# Patient Record
Sex: Female | Born: 1935 | Race: White | Hispanic: No | Marital: Single | Smoking: Never smoker
Health system: Southern US, Community
[De-identification: ages and names within clinical notes are randomized; demographics above are authoritative.]

## PROBLEM LIST (undated history)

## (undated) DIAGNOSIS — R413 Other amnesia: Secondary | ICD-10-CM

## (undated) DIAGNOSIS — R410 Disorientation, unspecified: Secondary | ICD-10-CM

## (undated) DIAGNOSIS — E039 Hypothyroidism, unspecified: Secondary | ICD-10-CM

## (undated) DIAGNOSIS — H3530 Unspecified macular degeneration: Secondary | ICD-10-CM

## (undated) DIAGNOSIS — M810 Age-related osteoporosis without current pathological fracture: Secondary | ICD-10-CM

## (undated) DIAGNOSIS — E785 Hyperlipidemia, unspecified: Secondary | ICD-10-CM

## (undated) DIAGNOSIS — I10 Essential (primary) hypertension: Secondary | ICD-10-CM

## (undated) DIAGNOSIS — E119 Type 2 diabetes mellitus without complications: Secondary | ICD-10-CM

## (undated) HISTORY — DX: Hyperlipidemia, unspecified: E78.5

## (undated) HISTORY — DX: Hypothyroidism, unspecified: E03.9

## (undated) HISTORY — DX: Type 2 diabetes mellitus without complications: E11.9

## (undated) HISTORY — DX: Unspecified macular degeneration: H35.30

## (undated) HISTORY — DX: Other amnesia: R41.3

## (undated) HISTORY — DX: Essential (primary) hypertension: I10

## (undated) HISTORY — DX: Disorientation, unspecified: R41.0

## (undated) HISTORY — DX: Age-related osteoporosis without current pathological fracture: M81.0

---

## 2000-09-18 LAB — HM SIGMOIDOSCOPY: HM Sigmoidoscopy: NORMAL

## 2003-03-01 LAB — HM PAP SMEAR

## 2005-04-11 ENCOUNTER — Ambulatory Visit: Payer: Self-pay | Admitting: Family Medicine

## 2006-06-12 ENCOUNTER — Ambulatory Visit: Payer: Self-pay | Admitting: *Deleted

## 2007-02-19 ENCOUNTER — Ambulatory Visit: Payer: Self-pay | Admitting: *Deleted

## 2007-04-14 ENCOUNTER — Ambulatory Visit: Payer: Self-pay | Admitting: Gastroenterology

## 2007-04-14 LAB — HM COLONOSCOPY

## 2007-04-15 ENCOUNTER — Ambulatory Visit: Payer: Self-pay | Admitting: Gastroenterology

## 2007-06-18 ENCOUNTER — Ambulatory Visit: Payer: Self-pay | Admitting: *Deleted

## 2008-03-09 ENCOUNTER — Ambulatory Visit: Payer: Self-pay | Admitting: *Deleted

## 2008-06-20 ENCOUNTER — Ambulatory Visit: Payer: Self-pay | Admitting: *Deleted

## 2009-06-23 ENCOUNTER — Ambulatory Visit: Payer: Self-pay | Admitting: Family Medicine

## 2009-11-18 HISTORY — PX: ADENOIDECTOMY: SUR15

## 2010-09-25 ENCOUNTER — Ambulatory Visit: Payer: Self-pay | Admitting: Family Medicine

## 2010-10-22 ENCOUNTER — Ambulatory Visit: Payer: Self-pay | Admitting: Anesthesiology

## 2010-10-23 ENCOUNTER — Ambulatory Visit: Payer: Self-pay | Admitting: Otolaryngology

## 2011-09-05 ENCOUNTER — Ambulatory Visit: Payer: Self-pay | Admitting: Family Medicine

## 2011-11-01 ENCOUNTER — Ambulatory Visit: Payer: Self-pay | Admitting: Family Medicine

## 2011-11-06 LAB — HM MAMMOGRAPHY

## 2012-03-11 DIAGNOSIS — E039 Hypothyroidism, unspecified: Secondary | ICD-10-CM | POA: Diagnosis not present

## 2012-03-11 DIAGNOSIS — E785 Hyperlipidemia, unspecified: Secondary | ICD-10-CM | POA: Diagnosis not present

## 2012-03-11 DIAGNOSIS — E119 Type 2 diabetes mellitus without complications: Secondary | ICD-10-CM | POA: Diagnosis not present

## 2012-03-11 DIAGNOSIS — Z130 Encounter for screening for diseases of the blood and blood-forming organs and certain disorders involving the immune mechanism: Secondary | ICD-10-CM | POA: Diagnosis not present

## 2012-03-11 DIAGNOSIS — I10 Essential (primary) hypertension: Secondary | ICD-10-CM | POA: Diagnosis not present

## 2012-06-22 DIAGNOSIS — E119 Type 2 diabetes mellitus without complications: Secondary | ICD-10-CM | POA: Diagnosis not present

## 2012-06-22 DIAGNOSIS — I10 Essential (primary) hypertension: Secondary | ICD-10-CM | POA: Diagnosis not present

## 2012-06-22 DIAGNOSIS — Z1329 Encounter for screening for other suspected endocrine disorder: Secondary | ICD-10-CM | POA: Diagnosis not present

## 2012-06-22 DIAGNOSIS — Z130 Encounter for screening for diseases of the blood and blood-forming organs and certain disorders involving the immune mechanism: Secondary | ICD-10-CM | POA: Diagnosis not present

## 2012-07-17 DIAGNOSIS — J301 Allergic rhinitis due to pollen: Secondary | ICD-10-CM | POA: Diagnosis not present

## 2012-07-17 DIAGNOSIS — R490 Dysphonia: Secondary | ICD-10-CM | POA: Diagnosis not present

## 2012-07-17 DIAGNOSIS — J060 Acute laryngopharyngitis: Secondary | ICD-10-CM | POA: Diagnosis not present

## 2012-08-12 DIAGNOSIS — E11329 Type 2 diabetes mellitus with mild nonproliferative diabetic retinopathy without macular edema: Secondary | ICD-10-CM | POA: Diagnosis not present

## 2012-08-12 LAB — HM DIABETES EYE EXAM

## 2012-08-28 DIAGNOSIS — J060 Acute laryngopharyngitis: Secondary | ICD-10-CM | POA: Diagnosis not present

## 2012-08-28 DIAGNOSIS — J301 Allergic rhinitis due to pollen: Secondary | ICD-10-CM | POA: Diagnosis not present

## 2012-08-28 DIAGNOSIS — R490 Dysphonia: Secondary | ICD-10-CM | POA: Diagnosis not present

## 2012-09-23 DIAGNOSIS — Z1329 Encounter for screening for other suspected endocrine disorder: Secondary | ICD-10-CM | POA: Diagnosis not present

## 2012-09-23 DIAGNOSIS — I10 Essential (primary) hypertension: Secondary | ICD-10-CM | POA: Diagnosis not present

## 2012-09-23 DIAGNOSIS — Z1211 Encounter for screening for malignant neoplasm of colon: Secondary | ICD-10-CM | POA: Diagnosis not present

## 2012-09-23 DIAGNOSIS — E119 Type 2 diabetes mellitus without complications: Secondary | ICD-10-CM | POA: Diagnosis not present

## 2012-09-23 DIAGNOSIS — M810 Age-related osteoporosis without current pathological fracture: Secondary | ICD-10-CM | POA: Diagnosis not present

## 2012-09-24 DIAGNOSIS — Z1211 Encounter for screening for malignant neoplasm of colon: Secondary | ICD-10-CM | POA: Diagnosis not present

## 2012-11-19 ENCOUNTER — Ambulatory Visit: Payer: Self-pay | Admitting: Family Medicine

## 2012-11-19 DIAGNOSIS — Z13820 Encounter for screening for osteoporosis: Secondary | ICD-10-CM | POA: Diagnosis not present

## 2012-11-19 DIAGNOSIS — Z1231 Encounter for screening mammogram for malignant neoplasm of breast: Secondary | ICD-10-CM | POA: Diagnosis not present

## 2012-11-19 DIAGNOSIS — M810 Age-related osteoporosis without current pathological fracture: Secondary | ICD-10-CM | POA: Diagnosis not present

## 2013-01-27 DIAGNOSIS — M1710 Unilateral primary osteoarthritis, unspecified knee: Secondary | ICD-10-CM | POA: Diagnosis not present

## 2013-02-09 DIAGNOSIS — H3530 Unspecified macular degeneration: Secondary | ICD-10-CM | POA: Diagnosis not present

## 2013-02-12 ENCOUNTER — Ambulatory Visit: Payer: Self-pay | Admitting: Family Medicine

## 2013-02-12 DIAGNOSIS — M549 Dorsalgia, unspecified: Secondary | ICD-10-CM | POA: Diagnosis not present

## 2013-02-12 DIAGNOSIS — R609 Edema, unspecified: Secondary | ICD-10-CM | POA: Diagnosis not present

## 2013-02-25 DIAGNOSIS — M6282 Rhabdomyolysis: Secondary | ICD-10-CM | POA: Diagnosis not present

## 2013-02-25 DIAGNOSIS — E559 Vitamin D deficiency, unspecified: Secondary | ICD-10-CM | POA: Diagnosis not present

## 2013-02-25 DIAGNOSIS — M810 Age-related osteoporosis without current pathological fracture: Secondary | ICD-10-CM | POA: Diagnosis not present

## 2013-02-25 DIAGNOSIS — R609 Edema, unspecified: Secondary | ICD-10-CM | POA: Diagnosis not present

## 2013-02-25 DIAGNOSIS — M62838 Other muscle spasm: Secondary | ICD-10-CM | POA: Diagnosis not present

## 2013-03-05 ENCOUNTER — Emergency Department: Payer: Self-pay | Admitting: Emergency Medicine

## 2013-03-05 DIAGNOSIS — M7989 Other specified soft tissue disorders: Secondary | ICD-10-CM | POA: Diagnosis not present

## 2013-03-05 DIAGNOSIS — I10 Essential (primary) hypertension: Secondary | ICD-10-CM | POA: Diagnosis not present

## 2013-03-05 DIAGNOSIS — L0291 Cutaneous abscess, unspecified: Secondary | ICD-10-CM | POA: Diagnosis not present

## 2013-03-05 DIAGNOSIS — Z79899 Other long term (current) drug therapy: Secondary | ICD-10-CM | POA: Diagnosis not present

## 2013-03-05 DIAGNOSIS — M79609 Pain in unspecified limb: Secondary | ICD-10-CM | POA: Diagnosis not present

## 2013-03-05 DIAGNOSIS — R609 Edema, unspecified: Secondary | ICD-10-CM | POA: Diagnosis not present

## 2013-03-08 ENCOUNTER — Emergency Department: Payer: Self-pay | Admitting: Emergency Medicine

## 2013-03-08 DIAGNOSIS — Z79899 Other long term (current) drug therapy: Secondary | ICD-10-CM | POA: Diagnosis not present

## 2013-03-08 DIAGNOSIS — R9431 Abnormal electrocardiogram [ECG] [EKG]: Secondary | ICD-10-CM | POA: Diagnosis not present

## 2013-03-08 DIAGNOSIS — Z9181 History of falling: Secondary | ICD-10-CM | POA: Diagnosis not present

## 2013-03-08 DIAGNOSIS — M25559 Pain in unspecified hip: Secondary | ICD-10-CM | POA: Diagnosis not present

## 2013-03-08 DIAGNOSIS — S7000XA Contusion of unspecified hip, initial encounter: Secondary | ICD-10-CM | POA: Diagnosis not present

## 2013-03-08 DIAGNOSIS — R6889 Other general symptoms and signs: Secondary | ICD-10-CM | POA: Diagnosis not present

## 2013-03-08 DIAGNOSIS — R609 Edema, unspecified: Secondary | ICD-10-CM | POA: Diagnosis not present

## 2013-03-08 DIAGNOSIS — E119 Type 2 diabetes mellitus without complications: Secondary | ICD-10-CM | POA: Diagnosis not present

## 2013-03-08 LAB — CBC
HCT: 37.7 % (ref 35.0–47.0)
MCHC: 34.6 g/dL (ref 32.0–36.0)
MCV: 91 fL (ref 80–100)
RBC: 4.14 10*6/uL (ref 3.80–5.20)
RDW: 13.3 % (ref 11.5–14.5)

## 2013-03-08 LAB — BASIC METABOLIC PANEL
Anion Gap: 7 (ref 7–16)
BUN: 23 mg/dL — ABNORMAL HIGH (ref 7–18)
Calcium, Total: 9 mg/dL (ref 8.5–10.1)
Chloride: 105 mmol/L (ref 98–107)
Creatinine: 0.66 mg/dL (ref 0.60–1.30)
Osmolality: 285 (ref 275–301)
Potassium: 3.3 mmol/L — ABNORMAL LOW (ref 3.5–5.1)
Sodium: 140 mmol/L (ref 136–145)

## 2013-03-09 DIAGNOSIS — M810 Age-related osteoporosis without current pathological fracture: Secondary | ICD-10-CM | POA: Diagnosis not present

## 2013-03-09 DIAGNOSIS — I10 Essential (primary) hypertension: Secondary | ICD-10-CM | POA: Diagnosis not present

## 2013-03-09 DIAGNOSIS — R609 Edema, unspecified: Secondary | ICD-10-CM | POA: Diagnosis not present

## 2013-03-09 DIAGNOSIS — Z9181 History of falling: Secondary | ICD-10-CM | POA: Diagnosis not present

## 2013-03-09 DIAGNOSIS — E119 Type 2 diabetes mellitus without complications: Secondary | ICD-10-CM | POA: Diagnosis not present

## 2013-03-09 DIAGNOSIS — R269 Unspecified abnormalities of gait and mobility: Secondary | ICD-10-CM | POA: Diagnosis not present

## 2013-03-10 DIAGNOSIS — R269 Unspecified abnormalities of gait and mobility: Secondary | ICD-10-CM | POA: Diagnosis not present

## 2013-03-10 DIAGNOSIS — Z9181 History of falling: Secondary | ICD-10-CM | POA: Diagnosis not present

## 2013-03-10 DIAGNOSIS — I10 Essential (primary) hypertension: Secondary | ICD-10-CM | POA: Diagnosis not present

## 2013-03-10 DIAGNOSIS — M810 Age-related osteoporosis without current pathological fracture: Secondary | ICD-10-CM | POA: Diagnosis not present

## 2013-03-10 DIAGNOSIS — E119 Type 2 diabetes mellitus without complications: Secondary | ICD-10-CM | POA: Diagnosis not present

## 2013-03-10 DIAGNOSIS — R609 Edema, unspecified: Secondary | ICD-10-CM | POA: Diagnosis not present

## 2013-03-15 DIAGNOSIS — E119 Type 2 diabetes mellitus without complications: Secondary | ICD-10-CM | POA: Diagnosis not present

## 2013-03-15 DIAGNOSIS — I10 Essential (primary) hypertension: Secondary | ICD-10-CM | POA: Diagnosis not present

## 2013-03-15 DIAGNOSIS — Z9181 History of falling: Secondary | ICD-10-CM | POA: Diagnosis not present

## 2013-03-15 DIAGNOSIS — R269 Unspecified abnormalities of gait and mobility: Secondary | ICD-10-CM | POA: Diagnosis not present

## 2013-03-15 DIAGNOSIS — M810 Age-related osteoporosis without current pathological fracture: Secondary | ICD-10-CM | POA: Diagnosis not present

## 2013-03-15 DIAGNOSIS — R609 Edema, unspecified: Secondary | ICD-10-CM | POA: Diagnosis not present

## 2013-03-16 DIAGNOSIS — R269 Unspecified abnormalities of gait and mobility: Secondary | ICD-10-CM | POA: Diagnosis not present

## 2013-03-16 DIAGNOSIS — I10 Essential (primary) hypertension: Secondary | ICD-10-CM | POA: Diagnosis not present

## 2013-03-16 DIAGNOSIS — E119 Type 2 diabetes mellitus without complications: Secondary | ICD-10-CM | POA: Diagnosis not present

## 2013-03-16 DIAGNOSIS — Z9181 History of falling: Secondary | ICD-10-CM | POA: Diagnosis not present

## 2013-03-16 DIAGNOSIS — R609 Edema, unspecified: Secondary | ICD-10-CM | POA: Diagnosis not present

## 2013-03-16 DIAGNOSIS — M810 Age-related osteoporosis without current pathological fracture: Secondary | ICD-10-CM | POA: Diagnosis not present

## 2013-03-17 DIAGNOSIS — R269 Unspecified abnormalities of gait and mobility: Secondary | ICD-10-CM | POA: Diagnosis not present

## 2013-03-17 DIAGNOSIS — M810 Age-related osteoporosis without current pathological fracture: Secondary | ICD-10-CM | POA: Diagnosis not present

## 2013-03-17 DIAGNOSIS — R609 Edema, unspecified: Secondary | ICD-10-CM | POA: Diagnosis not present

## 2013-03-17 DIAGNOSIS — E119 Type 2 diabetes mellitus without complications: Secondary | ICD-10-CM | POA: Diagnosis not present

## 2013-03-17 DIAGNOSIS — I10 Essential (primary) hypertension: Secondary | ICD-10-CM | POA: Diagnosis not present

## 2013-03-17 DIAGNOSIS — Z9181 History of falling: Secondary | ICD-10-CM | POA: Diagnosis not present

## 2013-03-18 DIAGNOSIS — E119 Type 2 diabetes mellitus without complications: Secondary | ICD-10-CM | POA: Diagnosis not present

## 2013-03-18 DIAGNOSIS — R269 Unspecified abnormalities of gait and mobility: Secondary | ICD-10-CM | POA: Diagnosis not present

## 2013-03-18 DIAGNOSIS — R609 Edema, unspecified: Secondary | ICD-10-CM | POA: Diagnosis not present

## 2013-03-18 DIAGNOSIS — I10 Essential (primary) hypertension: Secondary | ICD-10-CM | POA: Diagnosis not present

## 2013-03-18 DIAGNOSIS — M810 Age-related osteoporosis without current pathological fracture: Secondary | ICD-10-CM | POA: Diagnosis not present

## 2013-03-18 DIAGNOSIS — Z9181 History of falling: Secondary | ICD-10-CM | POA: Diagnosis not present

## 2013-03-19 DIAGNOSIS — I10 Essential (primary) hypertension: Secondary | ICD-10-CM | POA: Diagnosis not present

## 2013-03-19 DIAGNOSIS — E119 Type 2 diabetes mellitus without complications: Secondary | ICD-10-CM | POA: Diagnosis not present

## 2013-03-19 DIAGNOSIS — R269 Unspecified abnormalities of gait and mobility: Secondary | ICD-10-CM | POA: Diagnosis not present

## 2013-03-19 DIAGNOSIS — Z9181 History of falling: Secondary | ICD-10-CM | POA: Diagnosis not present

## 2013-03-19 DIAGNOSIS — R609 Edema, unspecified: Secondary | ICD-10-CM | POA: Diagnosis not present

## 2013-03-19 DIAGNOSIS — M810 Age-related osteoporosis without current pathological fracture: Secondary | ICD-10-CM | POA: Diagnosis not present

## 2013-03-22 DIAGNOSIS — Z9181 History of falling: Secondary | ICD-10-CM | POA: Diagnosis not present

## 2013-03-22 DIAGNOSIS — I10 Essential (primary) hypertension: Secondary | ICD-10-CM | POA: Diagnosis not present

## 2013-03-22 DIAGNOSIS — R269 Unspecified abnormalities of gait and mobility: Secondary | ICD-10-CM | POA: Diagnosis not present

## 2013-03-22 DIAGNOSIS — R609 Edema, unspecified: Secondary | ICD-10-CM | POA: Diagnosis not present

## 2013-03-22 DIAGNOSIS — M810 Age-related osteoporosis without current pathological fracture: Secondary | ICD-10-CM | POA: Diagnosis not present

## 2013-03-22 DIAGNOSIS — E119 Type 2 diabetes mellitus without complications: Secondary | ICD-10-CM | POA: Diagnosis not present

## 2013-03-24 DIAGNOSIS — R609 Edema, unspecified: Secondary | ICD-10-CM | POA: Diagnosis not present

## 2013-03-24 DIAGNOSIS — E785 Hyperlipidemia, unspecified: Secondary | ICD-10-CM | POA: Diagnosis not present

## 2013-03-24 DIAGNOSIS — M25559 Pain in unspecified hip: Secondary | ICD-10-CM | POA: Diagnosis not present

## 2013-03-24 DIAGNOSIS — E119 Type 2 diabetes mellitus without complications: Secondary | ICD-10-CM | POA: Diagnosis not present

## 2013-03-24 DIAGNOSIS — M810 Age-related osteoporosis without current pathological fracture: Secondary | ICD-10-CM | POA: Diagnosis not present

## 2013-03-24 DIAGNOSIS — I10 Essential (primary) hypertension: Secondary | ICD-10-CM | POA: Diagnosis not present

## 2013-03-24 DIAGNOSIS — R269 Unspecified abnormalities of gait and mobility: Secondary | ICD-10-CM | POA: Diagnosis not present

## 2013-03-24 DIAGNOSIS — E039 Hypothyroidism, unspecified: Secondary | ICD-10-CM | POA: Diagnosis not present

## 2013-03-24 DIAGNOSIS — Z9181 History of falling: Secondary | ICD-10-CM | POA: Diagnosis not present

## 2013-03-25 DIAGNOSIS — R269 Unspecified abnormalities of gait and mobility: Secondary | ICD-10-CM | POA: Diagnosis not present

## 2013-03-25 DIAGNOSIS — R609 Edema, unspecified: Secondary | ICD-10-CM | POA: Diagnosis not present

## 2013-03-25 DIAGNOSIS — E119 Type 2 diabetes mellitus without complications: Secondary | ICD-10-CM | POA: Diagnosis not present

## 2013-03-25 DIAGNOSIS — I10 Essential (primary) hypertension: Secondary | ICD-10-CM | POA: Diagnosis not present

## 2013-03-25 DIAGNOSIS — Z9181 History of falling: Secondary | ICD-10-CM | POA: Diagnosis not present

## 2013-03-25 DIAGNOSIS — M810 Age-related osteoporosis without current pathological fracture: Secondary | ICD-10-CM | POA: Diagnosis not present

## 2013-03-26 ENCOUNTER — Encounter: Payer: Self-pay | Admitting: Neurology

## 2013-03-26 ENCOUNTER — Ambulatory Visit (INDEPENDENT_AMBULATORY_CARE_PROVIDER_SITE_OTHER): Payer: Medicare Other | Admitting: Neurology

## 2013-03-26 VITALS — BP 166/84 | HR 74 | Temp 97.8°F | Ht 70.0 in | Wt 195.0 lb

## 2013-03-26 DIAGNOSIS — G20 Parkinson's disease: Secondary | ICD-10-CM

## 2013-03-26 DIAGNOSIS — M810 Age-related osteoporosis without current pathological fracture: Secondary | ICD-10-CM | POA: Diagnosis not present

## 2013-03-26 DIAGNOSIS — R269 Unspecified abnormalities of gait and mobility: Secondary | ICD-10-CM | POA: Diagnosis not present

## 2013-03-26 DIAGNOSIS — Z9181 History of falling: Secondary | ICD-10-CM | POA: Diagnosis not present

## 2013-03-26 DIAGNOSIS — I10 Essential (primary) hypertension: Secondary | ICD-10-CM | POA: Diagnosis not present

## 2013-03-26 DIAGNOSIS — R296 Repeated falls: Secondary | ICD-10-CM

## 2013-03-26 DIAGNOSIS — R609 Edema, unspecified: Secondary | ICD-10-CM | POA: Diagnosis not present

## 2013-03-26 DIAGNOSIS — E119 Type 2 diabetes mellitus without complications: Secondary | ICD-10-CM | POA: Diagnosis not present

## 2013-03-26 DIAGNOSIS — G20A1 Parkinson's disease without dyskinesia, without mention of fluctuations: Secondary | ICD-10-CM

## 2013-03-26 MED ORDER — CARBIDOPA-LEVODOPA 25-100 MG PO TABS: 0.5000 | ORAL_TABLET | Freq: Three times a day (TID) | ORAL | Status: DC

## 2013-03-26 NOTE — Patient Instructions (Addendum)
I think overall you are doing fairly well but I do want to suggest a few things today:  Remember to drink plenty of fluid, eat healthy meals and do not skip any meals. Try to eat protein with a every meal and eat a healthy snack such as fruit or nuts in between meals. Try to keep a regular sleep-wake schedule and try to exercise daily, particularly in the form of walking, 20-30 minutes a day, if you can.   Engage in social activities in your community and with your family and try to keep up with current events by reading the newspaper or watching the news.   As far as your medications are concerned, I would like to suggest starting Sinemet 25/100 mg: Take half a pill twice daily, at approximately 8 AM and 1 PM for one week, then half a pill 3 times a day, at 8 AM, 1 PM and 6 PM thereafter.   As far as diagnostic testing: MRI brain  I would like to see you back in 3 months, sooner if we need to. Please call us with any interim questions, concerns, problems, updates or refill requests.  Please also call us for any test results so we can go over those with you on the phone. Brett Canales is my clinical assistant and will answer any of your questions and relay your messages to me and also relay most of my messages to you.  Our phone number is [phone]. We also have an after hours call service for urgent matters and there is a physician on-call for urgent questions. For any emergencies you know to call 911 or go to the nearest emergency room.

## 2013-03-26 NOTE — Progress Notes (Signed)
Subjective:    Patient ID: Kara Moore is a 78 y.o. female.  HPI  Huston Foley, MD, PhD [facility] [address], [address] [address] [place], [place] [address]  Dear Dr. Sherald Barge,   I saw your patient, Kara Moore, upon your kind request in my neurologic clinic today for initial consultation of her gait dysfunction, recurrent falls, and decreased mobility. The patient is accompanied by her niece [name] today. As you know, Kara Moore is a very pleasant 78 year old right-handed woman with an underlying medical history of diabetes, hyperlipidemia, hypothyroidism, vitamin D deficiency reflux disease, and hypertension and osteoporosis who has been experiencing problems with her fine motor skills and mobility in general the past month or 2. She feels that she has become slower than usual and it takes her a long time to dress herself but. In fact she needs assistance with some of her ADLs. She has had urinary urgency. She recently had physical therapy and her therapist noted lack of ability, inability to dress herself and drooling. The patient has given up driving recently.  She currently is on ranitidine, furosemide, Celexa, vitamin D. She recently had blood work in your office on 03/18/2013 which showed an elevated glucose level of 175 as well as an elevated BUN to creatinine ratio at 32. Otherwise her CMP was normal, lipid profile was unremarkable but hemoglobin A1c was 6.7, mildly elevated. She reports on 02/11/13 she fell out of her bed and had difficulty getting back up. Her nephew was visiting from [place], but he could not hear her, as he is hard of hearing and she was calling in a soft voice. She developed rhabdomyolysis. In the latter part of April she was taken to [facility] in [place], then to the ER for suspected cellulitis. She had an US of the LEs and did not have a blood clot. She was given lasix for swelling. The next day she fell, and then again, and then again the  next day. After these falls, she started having care 24/7 through [facility]. She has been in PT since the end of April. She has been using a 2 wheeled walker since then.  She has a strong FHx of tremors on her mother's side (including M, MGM and cousins). Both of her siblings passed away.  Never had a TIA or a Stroke, and denies one sided weakness, numbness, tingling, slurring of speech or droopy face and denies recurrent headaches.    Her Past Medical History Is Significant For: Past Medical History  Diagnosis Date  . Hypertension   . Diabetes mellitus without complication   . Hypercholesteremia     Her Past Surgical History Is Significant For: History reviewed. No pertinent past surgical history.  Her Family History Is Significant For: Family History  Problem Relation Age of Onset  . Tremor Maternal Grandmother   . Diabetes Paternal Grandfather     Her Social History Is Significant For: History   Social History  . Marital Status: Unknown    Spouse Name: N/A    Number of Children: N/A  . Years of Education: N/A   Social History Main Topics  . Smoking status: Never Smoker   . Smokeless tobacco: None  . Alcohol Use: No  . Drug Use: No  . Sexually Active: None   Other Topics Concern  . None   Social History Narrative  . None    Her Allergies Are:  No Known Allergies:   Her Current Medications Are:  Outpatient  Encounter Prescriptions as of 03/26/2013  Medication Sig Dispense Refill  . Acetaminophen-Codeine (TYLENOL WITH CODEINE #3 PO) Take 1 tablet by mouth every 6 (six) hours.      Marland Kitchen aspirin 81 MG chewable tablet Chew 81 mg by mouth daily.      . carbidopa-levodopa (SINEMET IR) 25-100 MG per tablet Take 0.5 tablets by mouth 3 (three) times daily. Follow titration instructions provided.  45 tablet  3  . citalopram (CELEXA) 20 MG tablet Take 20 mg by mouth daily.      . cyclobenzaprine (FLEXERIL) 10 MG tablet Take 10 mg by mouth 3 (three) times daily as needed for  muscle spasms.      . fluticasone (FLONASE) 50 MCG/ACT nasal spray Place 2 sprays into the nose daily.      . furosemide (LASIX) 20 MG tablet Take 20 mg by mouth 2 (two) times daily.      Marland Kitchen glipiZIDE (GLUCOTROL) 10 MG tablet Take 10 mg by mouth 2 (two) times daily before a meal.      . glucosamine-chondroitin 500-400 MG tablet Take 1 tablet by mouth 3 (three) times daily.      Marland Kitchen levothyroxine (SYNTHROID, LEVOTHROID) 25 MCG tablet Take 25 mcg by mouth daily before breakfast.      . lovastatin (MEVACOR) 20 MG tablet Take 20 mg by mouth at bedtime.      . potassium chloride (K-DUR,KLOR-CON) 10 MEQ tablet Take 10 mEq by mouth 2 (two) times daily.      . ranitidine (ZANTAC) 150 MG capsule Take 150 mg by mouth 2 (two) times daily.      . valsartan (DIOVAN) 160 MG tablet Take 160 mg by mouth daily.      Marland Kitchen VITAMIN D, CHOLECALCIFEROL, PO Take 1 tablet by mouth once a week.       No facility-administered encounter medications on file as of 03/26/2013.  :  Review of Systems  Cardiovascular: Positive for leg swelling.  Genitourinary: Positive for difficulty urinating.  Musculoskeletal: Positive for myalgias, joint swelling and arthralgias.       Cramps  Neurological: Positive for tremors and speech difficulty.       Memory loss  Psychiatric/Behavioral: Positive for confusion.    Objective:  Neurologic Exam  Physical Exam Physical Examination:   Filed Vitals:   03/26/13 0944  BP: 166/84  Pulse: 74  Temp: 97.8 F (36.6 C)    General Examination: The patient is a very pleasant 78 y.o. female in no acute distress. She is calm and cooperative with the exam. She denies Auditory Hallucinations and Visual Hallucinations.   HEENT: Normocephalic, atraumatic, pupils are equal, round and reactive to light and accommodation. Funduscopic exam is normal with sharp disc margins noted. Extraocular tracking shows moderate saccadic breakdown without nystagmus noted. Hearing is intact. Tympanic membranes are  clear bilaterally. Face is symmetric with moderate facial masking and normal facial sensation. There is no lip, neck or jaw tremor. Neck is moderately rigid with intact passive ROM. There are no carotid bruits on auscultation. Oropharynx exam reveals mild mouth dryness. No significant airway crowding is noted. Mallampati is class II. Tongue protrudes centrally and palate elevates symmetrically. Mild drooling is noted.  Chest: is clear to auscultation without wheezing, rhonchi or crackles noted.  Heart: sounds are regular and normal without murmurs, rubs or gallops noted.   Abdomen: is soft, non-tender and non-distended with normal bowel sounds appreciated on auscultation.  Extremities: There is 1+ pitting edema in the distal lower extremities bilaterally.  Pedal pulses are intact.  Skin: is warm and dry with no trophic changes noted.  Musculoskeletal: exam reveals no obvious joint deformities, tenderness or joint swelling or erythema.  Neurologically:  Mental status: The patient is awake and alert, paying good  attention. She is able to to partially provide the history. Her niece provides details. She is oriented to: person, place, time/date, situation, day of week, month of year and year. Her memory, attention, language and knowledge are impaired, mildly. There is no aphasia, agnosia, apraxia or anomia. There is a mild degree of bradyphrenia. Speech is mildly hypophonic with no dysarthria noted. Mood is congruent and affect is normal.  Her MMSE score is 29/30. CDT is 4/4. AFT (Animal Fluency Test) score is 14.   Cranial nerves are as described above under HEENT exam. In addition, shoulder shrug is normal with equal shoulder height noted.  Motor exam: Normal bulk, and strength for age is noted. Tone is mildly rigid with presence of cogwheeling in the right upper extremity. There is overall moderate bradykinesia. There is no drift or rebound. There is no resting tremor, but she has a subtle UE  postural tremor.   Romberg is negative. Reflexes are 1+ in the upper extremities and 1+ in the lower extremities. Toes are downgoing bilaterally. Fine motor skills: Finger taps, hand movements, and rapid alternating patting are moderately impaired bilaterally, L worse than R. Foot taps and foot agility are severely impaired bilaterally, L worse than R.    Cerebellar testing shows no dysmetria or intention tremor on finger to nose testing. Heel to shin is unremarkable. There is no truncal or gait ataxia.   Sensory exam is intact to light touch, pinprick, vibration, temperature sense and proprioception in the upper and lower extremities.   Gait, station and balance: She stands up from the seated position with severe difficulty and needs to push herself up and needs mild assistance. No veering to one side is noted. No leaning to one side. Posture is moderately stooped. Stance is narrow-based. She walks with difficulty and cannot walk without the use of her walker. She walks with severe difficulty and has several episodes of freezing, and turns in 4 steps. Tandem walk is not possible. Balance is moderately impaired. She reports right hip pain with walking.    Assessment and Plan:   Assessment and Plan:  In summary, Kara Moore is a very pleasant 78 y.o.-year old female with a history of slowness, stiffness, recurrent falls, problems with fine motor skills and posture as well as drooling. Her history and physical exam are most consistent with left-sided predominant Parkinson's disease. Alternatively she could have parkinsonism rather than idiopathic Parkinson's disease. I talked to her and her niece at length about this condition its prognosis and treatment options and symptomatic management with pharmacological and nonpharmacological approaches. I encouraged her to continue with physical and occupational therapy at this time. I would like to try Sinemet 25/100 mg strength half a pill twice daily for  one week then half a pill 3 times a day thereafter. We talked about potential side effects of this medication including GI related side effects, lightheadedness in particular. I would like for her to reduce her Lasix use as she looks a little dehydrated and has had some lightheadedness symptomatically. She is to continue with her compression hose and elevate her legs as much as possible when sedentary. She is to use her walker at all times. I would like to pursue a brain MRI. She was  in agreement. I answered all their questions today and would like to reevaluate her in 3 months, sooner if the need arises and would like for them to call with any interim questions, concerns, problems, updates a refill request. They were in agreement. Thank you very much for allowing me to participate in the care of this nice patient. If I can be of any further assistance to you please do not hesitate to call me at [phone].  Sincerely,   Huston Foley, MD, PhD

## 2013-03-29 DIAGNOSIS — R609 Edema, unspecified: Secondary | ICD-10-CM | POA: Diagnosis not present

## 2013-03-29 DIAGNOSIS — R269 Unspecified abnormalities of gait and mobility: Secondary | ICD-10-CM | POA: Diagnosis not present

## 2013-03-29 DIAGNOSIS — M810 Age-related osteoporosis without current pathological fracture: Secondary | ICD-10-CM | POA: Diagnosis not present

## 2013-03-29 DIAGNOSIS — E119 Type 2 diabetes mellitus without complications: Secondary | ICD-10-CM | POA: Diagnosis not present

## 2013-03-29 DIAGNOSIS — I10 Essential (primary) hypertension: Secondary | ICD-10-CM | POA: Diagnosis not present

## 2013-03-29 DIAGNOSIS — Z9181 History of falling: Secondary | ICD-10-CM | POA: Diagnosis not present

## 2013-03-30 DIAGNOSIS — E119 Type 2 diabetes mellitus without complications: Secondary | ICD-10-CM | POA: Diagnosis not present

## 2013-03-30 DIAGNOSIS — I10 Essential (primary) hypertension: Secondary | ICD-10-CM | POA: Diagnosis not present

## 2013-03-30 DIAGNOSIS — R609 Edema, unspecified: Secondary | ICD-10-CM | POA: Diagnosis not present

## 2013-03-30 DIAGNOSIS — R269 Unspecified abnormalities of gait and mobility: Secondary | ICD-10-CM | POA: Diagnosis not present

## 2013-03-30 DIAGNOSIS — M810 Age-related osteoporosis without current pathological fracture: Secondary | ICD-10-CM | POA: Diagnosis not present

## 2013-03-30 DIAGNOSIS — Z9181 History of falling: Secondary | ICD-10-CM | POA: Diagnosis not present

## 2013-03-31 DIAGNOSIS — R269 Unspecified abnormalities of gait and mobility: Secondary | ICD-10-CM | POA: Diagnosis not present

## 2013-03-31 DIAGNOSIS — R609 Edema, unspecified: Secondary | ICD-10-CM | POA: Diagnosis not present

## 2013-03-31 DIAGNOSIS — E119 Type 2 diabetes mellitus without complications: Secondary | ICD-10-CM | POA: Diagnosis not present

## 2013-03-31 DIAGNOSIS — Z9181 History of falling: Secondary | ICD-10-CM | POA: Diagnosis not present

## 2013-03-31 DIAGNOSIS — I10 Essential (primary) hypertension: Secondary | ICD-10-CM | POA: Diagnosis not present

## 2013-03-31 DIAGNOSIS — M810 Age-related osteoporosis without current pathological fracture: Secondary | ICD-10-CM | POA: Diagnosis not present

## 2013-04-01 DIAGNOSIS — M810 Age-related osteoporosis without current pathological fracture: Secondary | ICD-10-CM | POA: Diagnosis not present

## 2013-04-01 DIAGNOSIS — R609 Edema, unspecified: Secondary | ICD-10-CM | POA: Diagnosis not present

## 2013-04-01 DIAGNOSIS — I10 Essential (primary) hypertension: Secondary | ICD-10-CM | POA: Diagnosis not present

## 2013-04-01 DIAGNOSIS — Z9181 History of falling: Secondary | ICD-10-CM | POA: Diagnosis not present

## 2013-04-01 DIAGNOSIS — E119 Type 2 diabetes mellitus without complications: Secondary | ICD-10-CM | POA: Diagnosis not present

## 2013-04-01 DIAGNOSIS — R269 Unspecified abnormalities of gait and mobility: Secondary | ICD-10-CM | POA: Diagnosis not present

## 2013-04-02 DIAGNOSIS — Z9181 History of falling: Secondary | ICD-10-CM | POA: Diagnosis not present

## 2013-04-02 DIAGNOSIS — E119 Type 2 diabetes mellitus without complications: Secondary | ICD-10-CM | POA: Diagnosis not present

## 2013-04-02 DIAGNOSIS — M810 Age-related osteoporosis without current pathological fracture: Secondary | ICD-10-CM | POA: Diagnosis not present

## 2013-04-02 DIAGNOSIS — R609 Edema, unspecified: Secondary | ICD-10-CM | POA: Diagnosis not present

## 2013-04-02 DIAGNOSIS — I10 Essential (primary) hypertension: Secondary | ICD-10-CM | POA: Diagnosis not present

## 2013-04-02 DIAGNOSIS — R269 Unspecified abnormalities of gait and mobility: Secondary | ICD-10-CM | POA: Diagnosis not present

## 2013-04-05 DIAGNOSIS — Z9181 History of falling: Secondary | ICD-10-CM | POA: Diagnosis not present

## 2013-04-05 DIAGNOSIS — R609 Edema, unspecified: Secondary | ICD-10-CM | POA: Diagnosis not present

## 2013-04-05 DIAGNOSIS — M810 Age-related osteoporosis without current pathological fracture: Secondary | ICD-10-CM | POA: Diagnosis not present

## 2013-04-05 DIAGNOSIS — E119 Type 2 diabetes mellitus without complications: Secondary | ICD-10-CM | POA: Diagnosis not present

## 2013-04-05 DIAGNOSIS — I10 Essential (primary) hypertension: Secondary | ICD-10-CM | POA: Diagnosis not present

## 2013-04-05 DIAGNOSIS — R269 Unspecified abnormalities of gait and mobility: Secondary | ICD-10-CM | POA: Diagnosis not present

## 2013-04-06 DIAGNOSIS — E119 Type 2 diabetes mellitus without complications: Secondary | ICD-10-CM | POA: Diagnosis not present

## 2013-04-06 DIAGNOSIS — R269 Unspecified abnormalities of gait and mobility: Secondary | ICD-10-CM | POA: Diagnosis not present

## 2013-04-06 DIAGNOSIS — M810 Age-related osteoporosis without current pathological fracture: Secondary | ICD-10-CM | POA: Diagnosis not present

## 2013-04-06 DIAGNOSIS — Z9181 History of falling: Secondary | ICD-10-CM | POA: Diagnosis not present

## 2013-04-06 DIAGNOSIS — I10 Essential (primary) hypertension: Secondary | ICD-10-CM | POA: Diagnosis not present

## 2013-04-06 DIAGNOSIS — R609 Edema, unspecified: Secondary | ICD-10-CM | POA: Diagnosis not present

## 2013-04-07 DIAGNOSIS — I10 Essential (primary) hypertension: Secondary | ICD-10-CM | POA: Diagnosis not present

## 2013-04-07 DIAGNOSIS — M810 Age-related osteoporosis without current pathological fracture: Secondary | ICD-10-CM | POA: Diagnosis not present

## 2013-04-07 DIAGNOSIS — E119 Type 2 diabetes mellitus without complications: Secondary | ICD-10-CM | POA: Diagnosis not present

## 2013-04-07 DIAGNOSIS — R609 Edema, unspecified: Secondary | ICD-10-CM | POA: Diagnosis not present

## 2013-04-07 DIAGNOSIS — Z9181 History of falling: Secondary | ICD-10-CM | POA: Diagnosis not present

## 2013-04-07 DIAGNOSIS — R269 Unspecified abnormalities of gait and mobility: Secondary | ICD-10-CM | POA: Diagnosis not present

## 2013-04-08 ENCOUNTER — Ambulatory Visit
Admission: RE | Admit: 2013-04-08 | Discharge: 2013-04-08 | Disposition: A | Discharge status: Home / Self Care | Payer: Medicare Other | Source: Ambulatory Visit | Attending: Neurology | Admitting: Neurology

## 2013-04-08 DIAGNOSIS — R269 Unspecified abnormalities of gait and mobility: Secondary | ICD-10-CM | POA: Diagnosis not present

## 2013-04-08 DIAGNOSIS — G20 Parkinson's disease: Secondary | ICD-10-CM

## 2013-04-13 DIAGNOSIS — I10 Essential (primary) hypertension: Secondary | ICD-10-CM | POA: Diagnosis not present

## 2013-04-13 DIAGNOSIS — E119 Type 2 diabetes mellitus without complications: Secondary | ICD-10-CM | POA: Diagnosis not present

## 2013-04-13 DIAGNOSIS — Z9181 History of falling: Secondary | ICD-10-CM | POA: Diagnosis not present

## 2013-04-13 DIAGNOSIS — R269 Unspecified abnormalities of gait and mobility: Secondary | ICD-10-CM | POA: Diagnosis not present

## 2013-04-13 DIAGNOSIS — R609 Edema, unspecified: Secondary | ICD-10-CM | POA: Diagnosis not present

## 2013-04-13 DIAGNOSIS — M810 Age-related osteoporosis without current pathological fracture: Secondary | ICD-10-CM | POA: Diagnosis not present

## 2013-04-13 NOTE — Progress Notes (Signed)
Quick Note:  please call and advised the patient that her MRI brain showed expected findings for her age. There is mild atrophy or brain shrinkage and mild white matter changes which are chronic changes that occur in patients with hypertension, chronic elevated cholesterol or chronic diabetes. This can also be seen simply with advancing age. None of these findings explain her gait disorder in particular.  Thanks,  Huston Foley, MD, PhD [facility] ([facility] ______

## 2013-04-14 DIAGNOSIS — I10 Essential (primary) hypertension: Secondary | ICD-10-CM | POA: Diagnosis not present

## 2013-04-14 DIAGNOSIS — Z9181 History of falling: Secondary | ICD-10-CM | POA: Diagnosis not present

## 2013-04-14 DIAGNOSIS — M810 Age-related osteoporosis without current pathological fracture: Secondary | ICD-10-CM | POA: Diagnosis not present

## 2013-04-14 DIAGNOSIS — R609 Edema, unspecified: Secondary | ICD-10-CM | POA: Diagnosis not present

## 2013-04-14 DIAGNOSIS — E119 Type 2 diabetes mellitus without complications: Secondary | ICD-10-CM | POA: Diagnosis not present

## 2013-04-14 DIAGNOSIS — R269 Unspecified abnormalities of gait and mobility: Secondary | ICD-10-CM | POA: Diagnosis not present

## 2013-04-14 NOTE — Progress Notes (Signed)
Quick Note:  Spoke with patient and relayed the results of their MRI as well as Dr Athar's advise or recommendations. The patient was also reminded of any future appointments. The patient understood and had no questions.  ______ 

## 2013-04-15 DIAGNOSIS — R609 Edema, unspecified: Secondary | ICD-10-CM | POA: Diagnosis not present

## 2013-04-16 DIAGNOSIS — M810 Age-related osteoporosis without current pathological fracture: Secondary | ICD-10-CM | POA: Diagnosis not present

## 2013-04-16 DIAGNOSIS — Z9181 History of falling: Secondary | ICD-10-CM | POA: Diagnosis not present

## 2013-04-16 DIAGNOSIS — R609 Edema, unspecified: Secondary | ICD-10-CM | POA: Diagnosis not present

## 2013-04-16 DIAGNOSIS — R269 Unspecified abnormalities of gait and mobility: Secondary | ICD-10-CM | POA: Diagnosis not present

## 2013-04-16 DIAGNOSIS — E119 Type 2 diabetes mellitus without complications: Secondary | ICD-10-CM | POA: Diagnosis not present

## 2013-04-16 DIAGNOSIS — I10 Essential (primary) hypertension: Secondary | ICD-10-CM | POA: Diagnosis not present

## 2013-04-20 ENCOUNTER — Telehealth: Payer: Self-pay | Admitting: Neurology

## 2013-04-20 NOTE — Telephone Encounter (Signed)
Please ask pt to stay on the Sinemet (i.e. Carbidopa-levodopa).

## 2013-04-20 NOTE — Telephone Encounter (Signed)
Please advise previous comment.

## 2013-04-20 NOTE — Telephone Encounter (Signed)
I called and spoke with Leafy Half about per Dr. Frances Furbish that Linton Flemings will continue taking Sinemet

## 2013-04-26 DIAGNOSIS — Z1389 Encounter for screening for other disorder: Secondary | ICD-10-CM | POA: Diagnosis not present

## 2013-04-26 DIAGNOSIS — R109 Unspecified abdominal pain: Secondary | ICD-10-CM | POA: Diagnosis not present

## 2013-04-30 ENCOUNTER — Observation Stay: Payer: Self-pay | Admitting: Internal Medicine

## 2013-04-30 DIAGNOSIS — Z043 Encounter for examination and observation following other accident: Secondary | ICD-10-CM | POA: Diagnosis not present

## 2013-04-30 DIAGNOSIS — R5383 Other fatigue: Secondary | ICD-10-CM | POA: Diagnosis not present

## 2013-04-30 DIAGNOSIS — Z8249 Family history of ischemic heart disease and other diseases of the circulatory system: Secondary | ICD-10-CM | POA: Diagnosis not present

## 2013-04-30 DIAGNOSIS — G40909 Epilepsy, unspecified, not intractable, without status epilepticus: Secondary | ICD-10-CM | POA: Diagnosis not present

## 2013-04-30 DIAGNOSIS — E785 Hyperlipidemia, unspecified: Secondary | ICD-10-CM | POA: Diagnosis not present

## 2013-04-30 DIAGNOSIS — E039 Hypothyroidism, unspecified: Secondary | ICD-10-CM | POA: Diagnosis not present

## 2013-04-30 DIAGNOSIS — Z9181 History of falling: Secondary | ICD-10-CM | POA: Diagnosis not present

## 2013-04-30 DIAGNOSIS — I10 Essential (primary) hypertension: Secondary | ICD-10-CM | POA: Diagnosis not present

## 2013-04-30 DIAGNOSIS — K219 Gastro-esophageal reflux disease without esophagitis: Secondary | ICD-10-CM | POA: Diagnosis not present

## 2013-04-30 DIAGNOSIS — F411 Generalized anxiety disorder: Secondary | ICD-10-CM | POA: Diagnosis not present

## 2013-04-30 DIAGNOSIS — R5381 Other malaise: Secondary | ICD-10-CM | POA: Diagnosis not present

## 2013-04-30 DIAGNOSIS — Z79899 Other long term (current) drug therapy: Secondary | ICD-10-CM | POA: Diagnosis not present

## 2013-04-30 DIAGNOSIS — G20 Parkinson's disease: Secondary | ICD-10-CM | POA: Diagnosis not present

## 2013-04-30 DIAGNOSIS — E119 Type 2 diabetes mellitus without complications: Secondary | ICD-10-CM | POA: Diagnosis not present

## 2013-04-30 DIAGNOSIS — Z7982 Long term (current) use of aspirin: Secondary | ICD-10-CM | POA: Diagnosis not present

## 2013-04-30 DIAGNOSIS — R262 Difficulty in walking, not elsewhere classified: Secondary | ICD-10-CM | POA: Diagnosis not present

## 2013-04-30 DIAGNOSIS — S32509A Unspecified fracture of unspecified pubis, initial encounter for closed fracture: Secondary | ICD-10-CM | POA: Diagnosis not present

## 2013-04-30 LAB — CBC
HCT: 36.2 % (ref 35.0–47.0)
MCH: 31 pg (ref 26.0–34.0)
MCHC: 34.5 g/dL (ref 32.0–36.0)
Platelet: 289 10*3/uL (ref 150–440)
RDW: 13 % (ref 11.5–14.5)
WBC: 13.8 10*3/uL — ABNORMAL HIGH (ref 3.6–11.0)

## 2013-04-30 LAB — URINALYSIS, COMPLETE
Bilirubin,UR: NEGATIVE
Blood: NEGATIVE
Leukocyte Esterase: NEGATIVE
Nitrite: NEGATIVE
Ph: 5 (ref 4.5–8.0)
Specific Gravity: 1.026 (ref 1.003–1.030)
Squamous Epithelial: 1
Transitional Epi: <1
WBC UR: 4 /HPF (ref 0–5)

## 2013-04-30 LAB — COMPREHENSIVE METABOLIC PANEL
Albumin: 3.6 g/dL (ref 3.4–5.0)
Alkaline Phosphatase: 109 U/L (ref 50–136)
Anion Gap: 6 — ABNORMAL LOW (ref 7–16)
Calcium, Total: 9.4 mg/dL (ref 8.5–10.1)
Chloride: 102 mmol/L (ref 98–107)
Co2: 27 mmol/L (ref 21–32)
Creatinine: 0.93 mg/dL (ref 0.60–1.30)
EGFR (African American): >60
Glucose: 160 mg/dL — ABNORMAL HIGH (ref 65–99)
Osmolality: 280 (ref 275–301)
Potassium: 4.2 mmol/L (ref 3.5–5.1)
SGOT(AST): 30 U/L (ref 15–37)
SGPT (ALT): 28 U/L (ref 12–78)
Sodium: 135 mmol/L — ABNORMAL LOW (ref 136–145)
Total Protein: 7.2 g/dL (ref 6.4–8.2)

## 2013-04-30 LAB — CK TOTAL AND CKMB (NOT AT ARMC): CK, Total: 623 U/L — ABNORMAL HIGH (ref 21–215)

## 2013-04-30 LAB — TROPONIN I: Troponin-I: <0.02 ng/mL

## 2013-05-01 DIAGNOSIS — Z9181 History of falling: Secondary | ICD-10-CM | POA: Diagnosis not present

## 2013-05-01 DIAGNOSIS — R262 Difficulty in walking, not elsewhere classified: Secondary | ICD-10-CM | POA: Diagnosis not present

## 2013-05-01 DIAGNOSIS — S32509A Unspecified fracture of unspecified pubis, initial encounter for closed fracture: Secondary | ICD-10-CM | POA: Diagnosis not present

## 2013-05-01 DIAGNOSIS — I10 Essential (primary) hypertension: Secondary | ICD-10-CM | POA: Diagnosis not present

## 2013-05-01 DIAGNOSIS — G20 Parkinson's disease: Secondary | ICD-10-CM | POA: Diagnosis not present

## 2013-05-02 DIAGNOSIS — G20 Parkinson's disease: Secondary | ICD-10-CM | POA: Diagnosis not present

## 2013-05-02 DIAGNOSIS — R262 Difficulty in walking, not elsewhere classified: Secondary | ICD-10-CM | POA: Diagnosis not present

## 2013-05-02 DIAGNOSIS — Z9181 History of falling: Secondary | ICD-10-CM | POA: Diagnosis not present

## 2013-05-02 DIAGNOSIS — I10 Essential (primary) hypertension: Secondary | ICD-10-CM | POA: Diagnosis not present

## 2013-05-18 DIAGNOSIS — E119 Type 2 diabetes mellitus without complications: Secondary | ICD-10-CM | POA: Diagnosis not present

## 2013-05-18 DIAGNOSIS — M6281 Muscle weakness (generalized): Secondary | ICD-10-CM | POA: Diagnosis not present

## 2013-05-18 DIAGNOSIS — R262 Difficulty in walking, not elsewhere classified: Secondary | ICD-10-CM | POA: Diagnosis not present

## 2013-05-18 DIAGNOSIS — Z9181 History of falling: Secondary | ICD-10-CM | POA: Diagnosis not present

## 2013-05-18 DIAGNOSIS — I10 Essential (primary) hypertension: Secondary | ICD-10-CM | POA: Diagnosis not present

## 2013-05-18 DIAGNOSIS — G20 Parkinson's disease: Secondary | ICD-10-CM | POA: Diagnosis not present

## 2013-05-18 DIAGNOSIS — S329XXA Fracture of unspecified parts of lumbosacral spine and pelvis, initial encounter for closed fracture: Secondary | ICD-10-CM | POA: Diagnosis not present

## 2013-05-18 DIAGNOSIS — E785 Hyperlipidemia, unspecified: Secondary | ICD-10-CM | POA: Diagnosis not present

## 2013-05-18 DIAGNOSIS — E039 Hypothyroidism, unspecified: Secondary | ICD-10-CM | POA: Diagnosis not present

## 2013-05-19 DIAGNOSIS — M6281 Muscle weakness (generalized): Secondary | ICD-10-CM | POA: Diagnosis not present

## 2013-05-19 DIAGNOSIS — Z9181 History of falling: Secondary | ICD-10-CM | POA: Diagnosis not present

## 2013-05-19 DIAGNOSIS — I10 Essential (primary) hypertension: Secondary | ICD-10-CM | POA: Diagnosis not present

## 2013-05-19 DIAGNOSIS — R262 Difficulty in walking, not elsewhere classified: Secondary | ICD-10-CM | POA: Diagnosis not present

## 2013-05-19 DIAGNOSIS — G20 Parkinson's disease: Secondary | ICD-10-CM | POA: Diagnosis not present

## 2013-05-19 DIAGNOSIS — S329XXA Fracture of unspecified parts of lumbosacral spine and pelvis, initial encounter for closed fracture: Secondary | ICD-10-CM | POA: Diagnosis not present

## 2013-05-20 DIAGNOSIS — M6281 Muscle weakness (generalized): Secondary | ICD-10-CM | POA: Diagnosis not present

## 2013-05-20 DIAGNOSIS — R262 Difficulty in walking, not elsewhere classified: Secondary | ICD-10-CM | POA: Diagnosis not present

## 2013-05-20 DIAGNOSIS — S329XXA Fracture of unspecified parts of lumbosacral spine and pelvis, initial encounter for closed fracture: Secondary | ICD-10-CM | POA: Diagnosis not present

## 2013-05-20 DIAGNOSIS — Z9181 History of falling: Secondary | ICD-10-CM | POA: Diagnosis not present

## 2013-05-20 DIAGNOSIS — G20 Parkinson's disease: Secondary | ICD-10-CM | POA: Diagnosis not present

## 2013-05-20 DIAGNOSIS — I10 Essential (primary) hypertension: Secondary | ICD-10-CM | POA: Diagnosis not present

## 2013-05-21 DIAGNOSIS — Z9181 History of falling: Secondary | ICD-10-CM | POA: Diagnosis not present

## 2013-05-21 DIAGNOSIS — M6281 Muscle weakness (generalized): Secondary | ICD-10-CM | POA: Diagnosis not present

## 2013-05-21 DIAGNOSIS — G20 Parkinson's disease: Secondary | ICD-10-CM | POA: Diagnosis not present

## 2013-05-21 DIAGNOSIS — R262 Difficulty in walking, not elsewhere classified: Secondary | ICD-10-CM | POA: Diagnosis not present

## 2013-05-21 DIAGNOSIS — S329XXA Fracture of unspecified parts of lumbosacral spine and pelvis, initial encounter for closed fracture: Secondary | ICD-10-CM | POA: Diagnosis not present

## 2013-05-21 DIAGNOSIS — I10 Essential (primary) hypertension: Secondary | ICD-10-CM | POA: Diagnosis not present

## 2013-05-24 DIAGNOSIS — G20 Parkinson's disease: Secondary | ICD-10-CM | POA: Diagnosis not present

## 2013-05-24 DIAGNOSIS — S329XXA Fracture of unspecified parts of lumbosacral spine and pelvis, initial encounter for closed fracture: Secondary | ICD-10-CM | POA: Diagnosis not present

## 2013-05-24 DIAGNOSIS — Z9181 History of falling: Secondary | ICD-10-CM | POA: Diagnosis not present

## 2013-05-24 DIAGNOSIS — M6281 Muscle weakness (generalized): Secondary | ICD-10-CM | POA: Diagnosis not present

## 2013-05-24 DIAGNOSIS — R262 Difficulty in walking, not elsewhere classified: Secondary | ICD-10-CM | POA: Diagnosis not present

## 2013-05-24 DIAGNOSIS — I10 Essential (primary) hypertension: Secondary | ICD-10-CM | POA: Diagnosis not present

## 2013-05-25 DIAGNOSIS — G20 Parkinson's disease: Secondary | ICD-10-CM | POA: Diagnosis not present

## 2013-05-25 DIAGNOSIS — S329XXA Fracture of unspecified parts of lumbosacral spine and pelvis, initial encounter for closed fracture: Secondary | ICD-10-CM | POA: Diagnosis not present

## 2013-05-25 DIAGNOSIS — Z9181 History of falling: Secondary | ICD-10-CM | POA: Diagnosis not present

## 2013-05-25 DIAGNOSIS — M6281 Muscle weakness (generalized): Secondary | ICD-10-CM | POA: Diagnosis not present

## 2013-05-25 DIAGNOSIS — R262 Difficulty in walking, not elsewhere classified: Secondary | ICD-10-CM | POA: Diagnosis not present

## 2013-05-25 DIAGNOSIS — I10 Essential (primary) hypertension: Secondary | ICD-10-CM | POA: Diagnosis not present

## 2013-05-26 DIAGNOSIS — G20 Parkinson's disease: Secondary | ICD-10-CM | POA: Diagnosis not present

## 2013-05-26 DIAGNOSIS — M6281 Muscle weakness (generalized): Secondary | ICD-10-CM | POA: Diagnosis not present

## 2013-05-26 DIAGNOSIS — R262 Difficulty in walking, not elsewhere classified: Secondary | ICD-10-CM | POA: Diagnosis not present

## 2013-05-26 DIAGNOSIS — I10 Essential (primary) hypertension: Secondary | ICD-10-CM | POA: Diagnosis not present

## 2013-05-26 DIAGNOSIS — Z9181 History of falling: Secondary | ICD-10-CM | POA: Diagnosis not present

## 2013-05-26 DIAGNOSIS — S329XXA Fracture of unspecified parts of lumbosacral spine and pelvis, initial encounter for closed fracture: Secondary | ICD-10-CM | POA: Diagnosis not present

## 2013-05-27 DIAGNOSIS — G20 Parkinson's disease: Secondary | ICD-10-CM | POA: Diagnosis not present

## 2013-05-27 DIAGNOSIS — M6281 Muscle weakness (generalized): Secondary | ICD-10-CM | POA: Diagnosis not present

## 2013-05-27 DIAGNOSIS — I10 Essential (primary) hypertension: Secondary | ICD-10-CM | POA: Diagnosis not present

## 2013-05-27 DIAGNOSIS — R262 Difficulty in walking, not elsewhere classified: Secondary | ICD-10-CM | POA: Diagnosis not present

## 2013-05-27 DIAGNOSIS — S329XXA Fracture of unspecified parts of lumbosacral spine and pelvis, initial encounter for closed fracture: Secondary | ICD-10-CM | POA: Diagnosis not present

## 2013-05-27 DIAGNOSIS — Z9181 History of falling: Secondary | ICD-10-CM | POA: Diagnosis not present

## 2013-05-28 DIAGNOSIS — G20 Parkinson's disease: Secondary | ICD-10-CM | POA: Diagnosis not present

## 2013-05-28 DIAGNOSIS — R262 Difficulty in walking, not elsewhere classified: Secondary | ICD-10-CM | POA: Diagnosis not present

## 2013-05-28 DIAGNOSIS — I10 Essential (primary) hypertension: Secondary | ICD-10-CM | POA: Diagnosis not present

## 2013-05-28 DIAGNOSIS — Z9181 History of falling: Secondary | ICD-10-CM | POA: Diagnosis not present

## 2013-05-28 DIAGNOSIS — S329XXA Fracture of unspecified parts of lumbosacral spine and pelvis, initial encounter for closed fracture: Secondary | ICD-10-CM | POA: Diagnosis not present

## 2013-05-28 DIAGNOSIS — M6281 Muscle weakness (generalized): Secondary | ICD-10-CM | POA: Diagnosis not present

## 2013-05-31 DIAGNOSIS — S329XXA Fracture of unspecified parts of lumbosacral spine and pelvis, initial encounter for closed fracture: Secondary | ICD-10-CM | POA: Diagnosis not present

## 2013-05-31 DIAGNOSIS — Z9181 History of falling: Secondary | ICD-10-CM | POA: Diagnosis not present

## 2013-05-31 DIAGNOSIS — G20 Parkinson's disease: Secondary | ICD-10-CM | POA: Diagnosis not present

## 2013-05-31 DIAGNOSIS — I10 Essential (primary) hypertension: Secondary | ICD-10-CM | POA: Diagnosis not present

## 2013-05-31 DIAGNOSIS — R262 Difficulty in walking, not elsewhere classified: Secondary | ICD-10-CM | POA: Diagnosis not present

## 2013-05-31 DIAGNOSIS — M6281 Muscle weakness (generalized): Secondary | ICD-10-CM | POA: Diagnosis not present

## 2013-06-02 DIAGNOSIS — M6281 Muscle weakness (generalized): Secondary | ICD-10-CM | POA: Diagnosis not present

## 2013-06-02 DIAGNOSIS — G20 Parkinson's disease: Secondary | ICD-10-CM | POA: Diagnosis not present

## 2013-06-02 DIAGNOSIS — Z9181 History of falling: Secondary | ICD-10-CM | POA: Diagnosis not present

## 2013-06-02 DIAGNOSIS — R262 Difficulty in walking, not elsewhere classified: Secondary | ICD-10-CM | POA: Diagnosis not present

## 2013-06-02 DIAGNOSIS — I10 Essential (primary) hypertension: Secondary | ICD-10-CM | POA: Diagnosis not present

## 2013-06-02 DIAGNOSIS — S329XXA Fracture of unspecified parts of lumbosacral spine and pelvis, initial encounter for closed fracture: Secondary | ICD-10-CM | POA: Diagnosis not present

## 2013-06-03 DIAGNOSIS — M6281 Muscle weakness (generalized): Secondary | ICD-10-CM | POA: Diagnosis not present

## 2013-06-03 DIAGNOSIS — R262 Difficulty in walking, not elsewhere classified: Secondary | ICD-10-CM | POA: Diagnosis not present

## 2013-06-03 DIAGNOSIS — S329XXA Fracture of unspecified parts of lumbosacral spine and pelvis, initial encounter for closed fracture: Secondary | ICD-10-CM | POA: Diagnosis not present

## 2013-06-03 DIAGNOSIS — I10 Essential (primary) hypertension: Secondary | ICD-10-CM | POA: Diagnosis not present

## 2013-06-03 DIAGNOSIS — R300 Dysuria: Secondary | ICD-10-CM | POA: Diagnosis not present

## 2013-06-03 DIAGNOSIS — Z9181 History of falling: Secondary | ICD-10-CM | POA: Diagnosis not present

## 2013-06-03 DIAGNOSIS — G20 Parkinson's disease: Secondary | ICD-10-CM | POA: Diagnosis not present

## 2013-06-04 DIAGNOSIS — Z9181 History of falling: Secondary | ICD-10-CM | POA: Diagnosis not present

## 2013-06-04 DIAGNOSIS — G20 Parkinson's disease: Secondary | ICD-10-CM | POA: Diagnosis not present

## 2013-06-04 DIAGNOSIS — I10 Essential (primary) hypertension: Secondary | ICD-10-CM | POA: Diagnosis not present

## 2013-06-04 DIAGNOSIS — S329XXA Fracture of unspecified parts of lumbosacral spine and pelvis, initial encounter for closed fracture: Secondary | ICD-10-CM | POA: Diagnosis not present

## 2013-06-04 DIAGNOSIS — R262 Difficulty in walking, not elsewhere classified: Secondary | ICD-10-CM | POA: Diagnosis not present

## 2013-06-04 DIAGNOSIS — M6281 Muscle weakness (generalized): Secondary | ICD-10-CM | POA: Diagnosis not present

## 2013-06-05 DIAGNOSIS — M6281 Muscle weakness (generalized): Secondary | ICD-10-CM | POA: Diagnosis not present

## 2013-06-05 DIAGNOSIS — Z9181 History of falling: Secondary | ICD-10-CM | POA: Diagnosis not present

## 2013-06-05 DIAGNOSIS — I10 Essential (primary) hypertension: Secondary | ICD-10-CM | POA: Diagnosis not present

## 2013-06-05 DIAGNOSIS — R262 Difficulty in walking, not elsewhere classified: Secondary | ICD-10-CM | POA: Diagnosis not present

## 2013-06-05 DIAGNOSIS — G20 Parkinson's disease: Secondary | ICD-10-CM | POA: Diagnosis not present

## 2013-06-05 DIAGNOSIS — S329XXA Fracture of unspecified parts of lumbosacral spine and pelvis, initial encounter for closed fracture: Secondary | ICD-10-CM | POA: Diagnosis not present

## 2013-06-07 DIAGNOSIS — Z9181 History of falling: Secondary | ICD-10-CM | POA: Diagnosis not present

## 2013-06-07 DIAGNOSIS — R262 Difficulty in walking, not elsewhere classified: Secondary | ICD-10-CM | POA: Diagnosis not present

## 2013-06-07 DIAGNOSIS — I10 Essential (primary) hypertension: Secondary | ICD-10-CM | POA: Diagnosis not present

## 2013-06-07 DIAGNOSIS — G20 Parkinson's disease: Secondary | ICD-10-CM | POA: Diagnosis not present

## 2013-06-07 DIAGNOSIS — M6281 Muscle weakness (generalized): Secondary | ICD-10-CM | POA: Diagnosis not present

## 2013-06-07 DIAGNOSIS — S329XXA Fracture of unspecified parts of lumbosacral spine and pelvis, initial encounter for closed fracture: Secondary | ICD-10-CM | POA: Diagnosis not present

## 2013-06-08 DIAGNOSIS — S329XXA Fracture of unspecified parts of lumbosacral spine and pelvis, initial encounter for closed fracture: Secondary | ICD-10-CM | POA: Diagnosis not present

## 2013-06-08 DIAGNOSIS — M6281 Muscle weakness (generalized): Secondary | ICD-10-CM | POA: Diagnosis not present

## 2013-06-08 DIAGNOSIS — R262 Difficulty in walking, not elsewhere classified: Secondary | ICD-10-CM | POA: Diagnosis not present

## 2013-06-08 DIAGNOSIS — I10 Essential (primary) hypertension: Secondary | ICD-10-CM | POA: Diagnosis not present

## 2013-06-08 DIAGNOSIS — G20 Parkinson's disease: Secondary | ICD-10-CM | POA: Diagnosis not present

## 2013-06-08 DIAGNOSIS — Z9181 History of falling: Secondary | ICD-10-CM | POA: Diagnosis not present

## 2013-06-09 DIAGNOSIS — Z9181 History of falling: Secondary | ICD-10-CM | POA: Diagnosis not present

## 2013-06-09 DIAGNOSIS — R262 Difficulty in walking, not elsewhere classified: Secondary | ICD-10-CM | POA: Diagnosis not present

## 2013-06-09 DIAGNOSIS — M6281 Muscle weakness (generalized): Secondary | ICD-10-CM | POA: Diagnosis not present

## 2013-06-09 DIAGNOSIS — G20 Parkinson's disease: Secondary | ICD-10-CM | POA: Diagnosis not present

## 2013-06-09 DIAGNOSIS — S329XXA Fracture of unspecified parts of lumbosacral spine and pelvis, initial encounter for closed fracture: Secondary | ICD-10-CM | POA: Diagnosis not present

## 2013-06-09 DIAGNOSIS — I10 Essential (primary) hypertension: Secondary | ICD-10-CM | POA: Diagnosis not present

## 2013-06-10 DIAGNOSIS — S329XXA Fracture of unspecified parts of lumbosacral spine and pelvis, initial encounter for closed fracture: Secondary | ICD-10-CM | POA: Diagnosis not present

## 2013-06-10 DIAGNOSIS — Z79899 Other long term (current) drug therapy: Secondary | ICD-10-CM | POA: Diagnosis not present

## 2013-06-10 DIAGNOSIS — R262 Difficulty in walking, not elsewhere classified: Secondary | ICD-10-CM | POA: Diagnosis not present

## 2013-06-10 DIAGNOSIS — Z0000 Encounter for general adult medical examination without abnormal findings: Secondary | ICD-10-CM | POA: Diagnosis not present

## 2013-06-10 DIAGNOSIS — G20 Parkinson's disease: Secondary | ICD-10-CM | POA: Diagnosis not present

## 2013-06-10 DIAGNOSIS — M6281 Muscle weakness (generalized): Secondary | ICD-10-CM | POA: Diagnosis not present

## 2013-06-10 DIAGNOSIS — Z9181 History of falling: Secondary | ICD-10-CM | POA: Diagnosis not present

## 2013-06-10 DIAGNOSIS — I10 Essential (primary) hypertension: Secondary | ICD-10-CM | POA: Diagnosis not present

## 2013-06-11 DIAGNOSIS — Z9181 History of falling: Secondary | ICD-10-CM | POA: Diagnosis not present

## 2013-06-11 DIAGNOSIS — M6281 Muscle weakness (generalized): Secondary | ICD-10-CM | POA: Diagnosis not present

## 2013-06-11 DIAGNOSIS — G20 Parkinson's disease: Secondary | ICD-10-CM | POA: Diagnosis not present

## 2013-06-11 DIAGNOSIS — R262 Difficulty in walking, not elsewhere classified: Secondary | ICD-10-CM | POA: Diagnosis not present

## 2013-06-11 DIAGNOSIS — S329XXA Fracture of unspecified parts of lumbosacral spine and pelvis, initial encounter for closed fracture: Secondary | ICD-10-CM | POA: Diagnosis not present

## 2013-06-11 DIAGNOSIS — I10 Essential (primary) hypertension: Secondary | ICD-10-CM | POA: Diagnosis not present

## 2013-06-12 ENCOUNTER — Emergency Department: Payer: Self-pay | Admitting: Emergency Medicine

## 2013-06-12 DIAGNOSIS — S329XXA Fracture of unspecified parts of lumbosacral spine and pelvis, initial encounter for closed fracture: Secondary | ICD-10-CM | POA: Diagnosis not present

## 2013-06-12 DIAGNOSIS — G309 Alzheimer's disease, unspecified: Secondary | ICD-10-CM | POA: Diagnosis not present

## 2013-06-12 DIAGNOSIS — F0280 Dementia in other diseases classified elsewhere without behavioral disturbance: Secondary | ICD-10-CM | POA: Diagnosis not present

## 2013-06-12 DIAGNOSIS — I4891 Unspecified atrial fibrillation: Secondary | ICD-10-CM | POA: Diagnosis not present

## 2013-06-12 DIAGNOSIS — K219 Gastro-esophageal reflux disease without esophagitis: Secondary | ICD-10-CM | POA: Diagnosis not present

## 2013-06-12 DIAGNOSIS — S32509A Unspecified fracture of unspecified pubis, initial encounter for closed fracture: Secondary | ICD-10-CM | POA: Diagnosis not present

## 2013-06-12 DIAGNOSIS — I10 Essential (primary) hypertension: Secondary | ICD-10-CM | POA: Diagnosis not present

## 2013-06-12 DIAGNOSIS — S79929A Unspecified injury of unspecified thigh, initial encounter: Secondary | ICD-10-CM | POA: Diagnosis not present

## 2013-06-12 DIAGNOSIS — S72009A Fracture of unspecified part of neck of unspecified femur, initial encounter for closed fracture: Secondary | ICD-10-CM | POA: Diagnosis not present

## 2013-06-12 DIAGNOSIS — E119 Type 2 diabetes mellitus without complications: Secondary | ICD-10-CM | POA: Diagnosis not present

## 2013-06-12 DIAGNOSIS — M545 Low back pain: Secondary | ICD-10-CM | POA: Diagnosis not present

## 2013-06-12 DIAGNOSIS — Z79899 Other long term (current) drug therapy: Secondary | ICD-10-CM | POA: Diagnosis not present

## 2013-06-12 DIAGNOSIS — E039 Hypothyroidism, unspecified: Secondary | ICD-10-CM | POA: Diagnosis not present

## 2013-06-14 ENCOUNTER — Telehealth: Payer: Self-pay | Admitting: Neurology

## 2013-06-14 DIAGNOSIS — M6281 Muscle weakness (generalized): Secondary | ICD-10-CM | POA: Diagnosis not present

## 2013-06-14 DIAGNOSIS — I10 Essential (primary) hypertension: Secondary | ICD-10-CM | POA: Diagnosis not present

## 2013-06-14 DIAGNOSIS — G20 Parkinson's disease: Secondary | ICD-10-CM | POA: Diagnosis not present

## 2013-06-14 DIAGNOSIS — R262 Difficulty in walking, not elsewhere classified: Secondary | ICD-10-CM | POA: Diagnosis not present

## 2013-06-14 DIAGNOSIS — Z9181 History of falling: Secondary | ICD-10-CM | POA: Diagnosis not present

## 2013-06-14 DIAGNOSIS — S329XXA Fracture of unspecified parts of lumbosacral spine and pelvis, initial encounter for closed fracture: Secondary | ICD-10-CM | POA: Diagnosis not present

## 2013-06-15 ENCOUNTER — Telehealth: Payer: Self-pay | Admitting: *Deleted

## 2013-06-15 DIAGNOSIS — S329XXA Fracture of unspecified parts of lumbosacral spine and pelvis, initial encounter for closed fracture: Secondary | ICD-10-CM | POA: Diagnosis not present

## 2013-06-15 DIAGNOSIS — G20 Parkinson's disease: Secondary | ICD-10-CM | POA: Diagnosis not present

## 2013-06-15 DIAGNOSIS — Z9181 History of falling: Secondary | ICD-10-CM | POA: Diagnosis not present

## 2013-06-15 DIAGNOSIS — R262 Difficulty in walking, not elsewhere classified: Secondary | ICD-10-CM | POA: Diagnosis not present

## 2013-06-15 DIAGNOSIS — I10 Essential (primary) hypertension: Secondary | ICD-10-CM | POA: Diagnosis not present

## 2013-06-15 DIAGNOSIS — M6281 Muscle weakness (generalized): Secondary | ICD-10-CM | POA: Diagnosis not present

## 2013-06-15 NOTE — Telephone Encounter (Signed)
Message copied by Avie Echevaria on Tue Jun 15, 2013  4:07 PM ------      Message from: Anice Paganini      Created: Tue Jun 15, 2013  2:19 PM      Contact: Elouise Munroe       Pt's niece [name] who is POA over pt called back states pt is in nursing home fell over the weekend needs to get in to see Dr. Frances Furbish, wanted to know if there is any way of getting her in sooner even if she has to see another Dr. Rock Nephew is transfer of care Dr. Sandria Manly. Please contact [name] at [phone] concerning this matter. Thanks  ------

## 2013-06-15 NOTE — Telephone Encounter (Signed)
Spoke with daughter and scheduled f/u appointment for 06/17/13 at 11:30 AM.

## 2013-06-16 DIAGNOSIS — M6281 Muscle weakness (generalized): Secondary | ICD-10-CM | POA: Diagnosis not present

## 2013-06-16 DIAGNOSIS — S329XXA Fracture of unspecified parts of lumbosacral spine and pelvis, initial encounter for closed fracture: Secondary | ICD-10-CM | POA: Diagnosis not present

## 2013-06-16 DIAGNOSIS — I10 Essential (primary) hypertension: Secondary | ICD-10-CM | POA: Diagnosis not present

## 2013-06-16 DIAGNOSIS — G20 Parkinson's disease: Secondary | ICD-10-CM | POA: Diagnosis not present

## 2013-06-16 DIAGNOSIS — Z9181 History of falling: Secondary | ICD-10-CM | POA: Diagnosis not present

## 2013-06-16 DIAGNOSIS — R262 Difficulty in walking, not elsewhere classified: Secondary | ICD-10-CM | POA: Diagnosis not present

## 2013-06-17 ENCOUNTER — Encounter: Payer: Self-pay | Admitting: Neurology

## 2013-06-17 ENCOUNTER — Ambulatory Visit (INDEPENDENT_AMBULATORY_CARE_PROVIDER_SITE_OTHER): Payer: Medicare Other | Admitting: Neurology

## 2013-06-17 VITALS — BP 156/81 | HR 62 | Ht 70.0 in | Wt 142.0 lb

## 2013-06-17 DIAGNOSIS — S329XXA Fracture of unspecified parts of lumbosacral spine and pelvis, initial encounter for closed fracture: Secondary | ICD-10-CM | POA: Diagnosis not present

## 2013-06-17 DIAGNOSIS — R002 Palpitations: Secondary | ICD-10-CM

## 2013-06-17 DIAGNOSIS — G20 Parkinson's disease: Secondary | ICD-10-CM | POA: Diagnosis not present

## 2013-06-17 DIAGNOSIS — R569 Unspecified convulsions: Secondary | ICD-10-CM | POA: Diagnosis not present

## 2013-06-17 DIAGNOSIS — R269 Unspecified abnormalities of gait and mobility: Secondary | ICD-10-CM | POA: Diagnosis not present

## 2013-06-17 DIAGNOSIS — R296 Repeated falls: Secondary | ICD-10-CM | POA: Insufficient documentation

## 2013-06-17 DIAGNOSIS — Z8781 Personal history of (healed) traumatic fracture: Secondary | ICD-10-CM

## 2013-06-17 DIAGNOSIS — Z9181 History of falling: Secondary | ICD-10-CM | POA: Diagnosis not present

## 2013-06-17 DIAGNOSIS — I10 Essential (primary) hypertension: Secondary | ICD-10-CM | POA: Diagnosis not present

## 2013-06-17 DIAGNOSIS — M6281 Muscle weakness (generalized): Secondary | ICD-10-CM | POA: Diagnosis not present

## 2013-06-17 DIAGNOSIS — R262 Difficulty in walking, not elsewhere classified: Secondary | ICD-10-CM | POA: Diagnosis not present

## 2013-06-17 DIAGNOSIS — G20A1 Parkinson's disease without dyskinesia, without mention of fluctuations: Secondary | ICD-10-CM | POA: Diagnosis not present

## 2013-06-17 NOTE — Patient Instructions (Signed)
I do want to suggest a few things today:  Use your walker at all times, do not stand or walk or transfer without assistance.   As far as your medications are concerned, I would like to suggest    As far as diagnostic testing: EEG, CT head, Cardiac monitoring, geriatrics referral, carotid doppler study.  I would like to see you back in 3 months, sooner if we need to. Please call us with any interim questions, concerns, problems, updates or refill requests.  Please also call us for any test results so we can go over those with you on the phone. Brett Canales is my clinical assistant and will answer any of your questions and relay your messages to me and also relay most of my messages to you.  Our phone number is [phone]. We also have an after hours call service for urgent matters and there is a physician on-call for urgent questions. For any emergencies you know to call 911 or go to the nearest emergency room.

## 2013-06-17 NOTE — Progress Notes (Signed)
Subjective:    Patient ID: Kara Moore is a 78 y.o. female.  HPI  Interim history:   Kara Moore is a very pleasant 78 year old right-handed woman with an underlying medical history of diabetes, hyperlipidemia, hypothyroidism, vitamin D deficiency reflux disease, and hypertension and osteoporosis who presents for followup consultation of her parkinsonism. She presents for a sooner than scheduled visit do to a recent fall and she is accompanied by two nieces today. I first met her on 03/26/2013 at which time her niece and the patient reported a approximately two-month history of decrease in fine motor skills, decrease in mobility, gait changes and recurrent falls. She reported becoming slower than usual taking longer to dress herself, needing assistance with some of her ADLs. She has had urinary urgency. She recently had physical therapy and her therapist noted lack of ability, inability to dress herself and drooling. The patient had to give up driving recently. She had a MRI brain on 5/22: Abnormal MRI scan of the brain showing mild changes of age-appropriate chronic microvascular ischemia and generalized cerebral atrophy.   On 02/11/13 she fell out of her bed and had difficulty getting back up. Her nephew was visiting from [place], but he could not hear her, as he is hard of hearing and she was calling in a soft voice. She developed rhabdomyolysis. In the latter part of April she was taken to [facility] in [place], then to the ER for suspected cellulitis. She had an US of the LEs and did not have a blood clot. She was given lasix for swelling. The next day she fell, and then again, and then again the next day. After these falls, she started having care 24/7 through [facility]. She has been in PT since the end of April. She has been using a 2 wheeled walker since then.  She has a strong FHx of tremors on her mother's side (including Kara Moore, Kara Moore and cousins). Both of her siblings passed away.  Never had a TIA or a  Stroke, and denies one sided weakness, numbness, tingling, slurring of speech or droopy face and denies recurrent headaches.   She has had an aid come in 3 hours each morning and in June she fell at home on 6/13, while coming out of the bathroom. She has been having issues with constipation and had taken Dulcolax suppositories, but also took Mag citrate and her niece found her on the floor and she had soiled herself and for some reason had not pushed her Lifeline button. She denies hitting her head or LOC. Her niece called 911 and she was taken to [facility] to the ER and was discharged to home from there. Then she was admitted to inpt rehab at [facility] center on 05/02/13 and is there since then. She has fallen in rehab and was found to have a pelvic Fx b/l, reportedly old. She has had some confusion. She fell this past weekend close to her bed, she is not sure if she hit her head.     Her Past Medical History Is Significant For: Past Medical History  Diagnosis Date  . Hypertension   . Diabetes mellitus without complication   . Hypercholesteremia     Her Past Surgical History Is Significant For: No past surgical history on file.  Her Family History Is Significant For: Family History  Problem Relation Age of Onset  . Tremor Maternal Grandmother   . Diabetes Paternal Grandfather     Her Social History Is Significant  For: History   Social History  . Marital Status: Unknown    Spouse Name: N/A    Number of Children: N/A  . Years of Education: N/A   Social History Main Topics  . Smoking status: Never Smoker   . Smokeless tobacco: Not on file  . Alcohol Use: No  . Drug Use: No  . Sexually Active: Not on file   Other Topics Concern  . Not on file   Social History Narrative  . No narrative on file    Her Allergies Are:  No Known Allergies:   Her Current Medications Are:  Outpatient Encounter Prescriptions as of 06/17/2013  Medication Sig Dispense Refill   . Acetaminophen-Codeine (TYLENOL WITH CODEINE #3 PO) Take 1 tablet by mouth every 6 (six) hours.      Marland Kitchen aspirin 81 MG chewable tablet Chew 81 mg by mouth daily.      . Calcium Carbonate-Vitamin D (CALCIUM-VITAMIN D) 500-200 MG-UNIT per tablet Take 1 tablet by mouth daily.      . carbidopa-levodopa (SINEMET IR) 25-100 MG per tablet Take 0.5 tablets by mouth 3 (three) times daily. Follow titration instructions provided.  45 tablet  3  . fluticasone (FLONASE) 50 MCG/ACT nasal spray Place 2 sprays into the nose daily.      . furosemide (LASIX) 20 MG tablet Take 20 mg by mouth 2 (two) times daily.      Marland Kitchen glipiZIDE (GLUCOTROL) 10 MG tablet Take 10 mg by mouth 2 (two) times daily before a meal.      . HYDROcodone-acetaminophen (NORCO/VICODIN) 5-325 MG per tablet Take 1 tablet by mouth every 6 (six) hours as needed for pain.      . hydrocortisone (ANUSOL-HC) 2.5 % rectal cream Place rectally 2 (two) times daily. PRN      . levothyroxine (SYNTHROID, LEVOTHROID) 25 MCG tablet Take 25 mcg by mouth daily before breakfast.      . lovastatin (MEVACOR) 20 MG tablet Take 40 mg by mouth at bedtime.       . mirtazapine (REMERON) 15 MG tablet Take 15 mg by mouth at bedtime.      . potassium chloride (K-DUR,KLOR-CON) 10 MEQ tablet Take 10 mEq by mouth daily.       . ranitidine (ZANTAC) 150 MG capsule Take 150 mg by mouth as needed.       . valsartan (DIOVAN) 160 MG tablet Take 160 mg by mouth daily.      Marland Kitchen VITAMIN D, CHOLECALCIFEROL, PO Take 1 tablet by mouth once a week.      . [DISCONTINUED] citalopram (CELEXA) 20 MG tablet Take 20 mg by mouth daily.      . [DISCONTINUED] cyclobenzaprine (FLEXERIL) 10 MG tablet Take 10 mg by mouth 3 (three) times daily as needed for muscle spasms.      . [DISCONTINUED] glucosamine-chondroitin 500-400 MG tablet Take 1 tablet by mouth 3 (three) times daily.       No facility-administered encounter medications on file as of 06/17/2013.  : Review of Systems  Cardiovascular:  Positive for leg swelling.  Skin:       Moles  Allergic/Immunologic: Positive for environmental allergies.  Neurological: Positive for tremors and weakness.       Memory loss  Psychiatric/Behavioral: Positive for hallucinations and sleep disturbance.       Decreased Energy, To much sleep, Depression ,Anxiety, Confusion    Objective:  Neurologic Exam  Physical Exam Physical Examination:   Filed Vitals:   06/17/13 1210  BP:  156/81  Pulse: 62    General Examination: The patient is a very pleasant 78 y.o. female in no acute distress. She is calm and cooperative with the exam. She denies Auditory Hallucinations and Visual Hallucinations.   HEENT: Normocephalic, atraumatic, pupils are equal, round and reactive to light and accommodation. Funduscopic exam is normal with sharp disc margins noted. Extraocular tracking shows moderate saccadic breakdown without nystagmus noted. Hearing is intact. Tympanic membranes are clear bilaterally. Face is symmetric with moderate facial masking and normal facial sensation. There is no lip, neck or jaw tremor. Neck is moderately rigid with intact passive ROM. There are no carotid bruits on auscultation. Oropharynx exam reveals mild mouth dryness. No significant airway crowding is noted. Mallampati is class II. Tongue protrudes centrally and palate elevates symmetrically. Mild drooling is noted.  Chest: is clear to auscultation without wheezing, rhonchi or crackles noted.  Heart: sounds are regular and normal without murmurs, rubs or gallops noted.   Abdomen: is soft, non-tender and non-distended with normal bowel sounds appreciated on auscultation.  Extremities: There is 1+ pitting edema in the distal lower extremities bilaterally. Pedal pulses are intact.  Skin: is warm and dry with no trophic changes noted.  Musculoskeletal: exam reveals no obvious joint deformities, tenderness or joint swelling or erythema.  Neurologically:  Mental status: The  patient is awake and alert, paying good  attention. She is able to to partially provide the history. Her niece provides details. She is oriented to: person, place, time/date, situation, day of week, month of year and year. Her memory, attention, language and knowledge are impaired, mildly. There is no aphasia, agnosia, apraxia or anomia. There is a mild degree of bradyphrenia. Speech is mildly hypophonic with no dysarthria noted. Mood is congruent and affect is normal.  Her MMSE score is 29/30. CDT is 4/4. AFT (Animal Fluency Test) score is 14; from last visit.   Cranial nerves are as described above under HEENT exam. In addition, shoulder shrug is normal with equal shoulder height noted.  Motor exam: Normal bulk, and strength for age is noted. Tone is mildly rigid with presence of cogwheeling in the right upper extremity. There is overall moderate bradykinesia. There is no drift or rebound. There is no resting tremor, but she has a subtle UE postural tremor.   Romberg is negative. Reflexes are 1+ in the upper extremities and 1+ in the lower extremities. Toes are downgoing bilaterally. Fine motor skills: Finger taps, hand movements, and rapid alternating patting are moderately impaired bilaterally, L worse than R. Foot taps and foot agility are severely impaired bilaterally, L worse than R.    Cerebellar testing shows no dysmetria or intention tremor on finger to nose testing. There is no truncal or gait ataxia.   Sensory exam is intact to light touch.   Gait, station and balance: I did not ask her to stand or walk today, as she did not bring her walker.   Assessment and Plan:   In summary, ALLISEN PIDGEON is a very pleasant 78 y.o.-year old female with a history of slowness, stiffness, recurrent falls, problems with fine motor skills and posture as well as drooling. Her history and physical exam are most consistent with left-sided predominant Parkinson's disease or parkinsonism, and she has a  significant gait disorder and recurrent falls, which seem out of proportion to her parkinsonism. She has a Hx of pelvic Fx as well. She is currently in inpatient rehabilitation. DDx: syncope, pre-syncope, TIA, Sz. I talked to her  and her nieces at length today about her condition and her decline. I would like to do more w/u: she recently had an echo in 5/14, per [name], but I do not have the results. I will order CTH, EEG, C. Doppler, cardiac monitor and suggest a referral to geriatrics. I suggested to continue with physical and occupational therapy at this time. She and her caretakers are advised that she is not to stand, transfer or walk without assistance and without her walker. I suggested no changes to her medications but did recommend avoiding narcotic pain medicine as much is possible. She is to continue with her compression hose and elevate her legs as much as possible when sedentary. She is to use her walker at all times. I discussed the recent brain MRI findings with them. I will see her back in a couple months.

## 2013-06-18 DIAGNOSIS — I10 Essential (primary) hypertension: Secondary | ICD-10-CM | POA: Diagnosis not present

## 2013-06-18 DIAGNOSIS — S329XXA Fracture of unspecified parts of lumbosacral spine and pelvis, initial encounter for closed fracture: Secondary | ICD-10-CM | POA: Diagnosis not present

## 2013-06-18 DIAGNOSIS — M6281 Muscle weakness (generalized): Secondary | ICD-10-CM | POA: Diagnosis not present

## 2013-06-18 DIAGNOSIS — G20 Parkinson's disease: Secondary | ICD-10-CM | POA: Diagnosis not present

## 2013-06-18 DIAGNOSIS — E785 Hyperlipidemia, unspecified: Secondary | ICD-10-CM | POA: Diagnosis not present

## 2013-06-18 DIAGNOSIS — R262 Difficulty in walking, not elsewhere classified: Secondary | ICD-10-CM | POA: Diagnosis not present

## 2013-06-18 DIAGNOSIS — E119 Type 2 diabetes mellitus without complications: Secondary | ICD-10-CM | POA: Diagnosis not present

## 2013-06-18 DIAGNOSIS — Z9181 History of falling: Secondary | ICD-10-CM | POA: Diagnosis not present

## 2013-06-18 DIAGNOSIS — E039 Hypothyroidism, unspecified: Secondary | ICD-10-CM | POA: Diagnosis not present

## 2013-06-21 DIAGNOSIS — R262 Difficulty in walking, not elsewhere classified: Secondary | ICD-10-CM | POA: Diagnosis not present

## 2013-06-21 DIAGNOSIS — M6281 Muscle weakness (generalized): Secondary | ICD-10-CM | POA: Diagnosis not present

## 2013-06-21 DIAGNOSIS — I10 Essential (primary) hypertension: Secondary | ICD-10-CM | POA: Diagnosis not present

## 2013-06-21 DIAGNOSIS — Z9181 History of falling: Secondary | ICD-10-CM | POA: Diagnosis not present

## 2013-06-21 DIAGNOSIS — G20 Parkinson's disease: Secondary | ICD-10-CM | POA: Diagnosis not present

## 2013-06-21 DIAGNOSIS — S329XXA Fracture of unspecified parts of lumbosacral spine and pelvis, initial encounter for closed fracture: Secondary | ICD-10-CM | POA: Diagnosis not present

## 2013-06-22 ENCOUNTER — Ambulatory Visit
Admission: RE | Admit: 2013-06-22 | Discharge: 2013-06-22 | Disposition: A | Discharge status: Home / Self Care | Payer: Medicare Other | Source: Ambulatory Visit | Attending: Neurology | Admitting: Neurology

## 2013-06-22 DIAGNOSIS — G20C Parkinsonism, unspecified: Secondary | ICD-10-CM

## 2013-06-22 DIAGNOSIS — S0990XA Unspecified injury of head, initial encounter: Secondary | ICD-10-CM | POA: Diagnosis not present

## 2013-06-22 DIAGNOSIS — I10 Essential (primary) hypertension: Secondary | ICD-10-CM | POA: Diagnosis not present

## 2013-06-22 DIAGNOSIS — IMO0001 Reserved for inherently not codable concepts without codable children: Secondary | ICD-10-CM

## 2013-06-22 DIAGNOSIS — Z8781 Personal history of (healed) traumatic fracture: Secondary | ICD-10-CM

## 2013-06-22 DIAGNOSIS — R262 Difficulty in walking, not elsewhere classified: Secondary | ICD-10-CM | POA: Diagnosis not present

## 2013-06-22 DIAGNOSIS — Z9181 History of falling: Secondary | ICD-10-CM | POA: Diagnosis not present

## 2013-06-22 DIAGNOSIS — M6281 Muscle weakness (generalized): Secondary | ICD-10-CM | POA: Diagnosis not present

## 2013-06-22 DIAGNOSIS — R296 Repeated falls: Secondary | ICD-10-CM

## 2013-06-22 DIAGNOSIS — S329XXA Fracture of unspecified parts of lumbosacral spine and pelvis, initial encounter for closed fracture: Secondary | ICD-10-CM | POA: Diagnosis not present

## 2013-06-22 DIAGNOSIS — R269 Unspecified abnormalities of gait and mobility: Secondary | ICD-10-CM

## 2013-06-22 DIAGNOSIS — G20 Parkinson's disease: Secondary | ICD-10-CM | POA: Diagnosis not present

## 2013-06-22 DIAGNOSIS — R002 Palpitations: Secondary | ICD-10-CM

## 2013-06-22 NOTE — Progress Notes (Signed)
Quick Note:  Please call patient and advise her that her brain CT showed no fracture or hemorrhage or stroke. She does have mild atrophy and which is volume loss or shrinkage. No further action is required at this time on this test. Huston Foley, MD, PhD [facility]  ______

## 2013-06-23 ENCOUNTER — Telehealth: Payer: Self-pay

## 2013-06-23 DIAGNOSIS — Z9181 History of falling: Secondary | ICD-10-CM | POA: Diagnosis not present

## 2013-06-23 DIAGNOSIS — M6281 Muscle weakness (generalized): Secondary | ICD-10-CM | POA: Diagnosis not present

## 2013-06-23 DIAGNOSIS — G20 Parkinson's disease: Secondary | ICD-10-CM | POA: Diagnosis not present

## 2013-06-23 DIAGNOSIS — I10 Essential (primary) hypertension: Secondary | ICD-10-CM | POA: Diagnosis not present

## 2013-06-23 DIAGNOSIS — S329XXA Fracture of unspecified parts of lumbosacral spine and pelvis, initial encounter for closed fracture: Secondary | ICD-10-CM | POA: Diagnosis not present

## 2013-06-23 DIAGNOSIS — R262 Difficulty in walking, not elsewhere classified: Secondary | ICD-10-CM | POA: Diagnosis not present

## 2013-06-23 NOTE — Telephone Encounter (Addendum)
Message copied by [facility] on Wed Jun 23, 2013  4:36 PM ------      Message from: Huston Foley      Created: Tue Jun 22, 2013  5:36 PM       Please call patient and advise her that her brain CT showed no fracture or hemorrhage or stroke. She does have mild atrophy and which is volume loss or shrinkage. No further action is required at this time on this test.      Huston Foley, MD, PhD      [facility])       ------ I called and advised her guardian. She asked why patient needs to avoid naps before EEG. I let her know that we don't want her brain rested. We want it to be more irritable so we can see if there are any abnormal activities occurring during EEG.

## 2013-06-24 DIAGNOSIS — Z9181 History of falling: Secondary | ICD-10-CM | POA: Diagnosis not present

## 2013-06-24 DIAGNOSIS — I10 Essential (primary) hypertension: Secondary | ICD-10-CM | POA: Diagnosis not present

## 2013-06-24 DIAGNOSIS — R262 Difficulty in walking, not elsewhere classified: Secondary | ICD-10-CM | POA: Diagnosis not present

## 2013-06-24 DIAGNOSIS — M6281 Muscle weakness (generalized): Secondary | ICD-10-CM | POA: Diagnosis not present

## 2013-06-24 DIAGNOSIS — S329XXA Fracture of unspecified parts of lumbosacral spine and pelvis, initial encounter for closed fracture: Secondary | ICD-10-CM | POA: Diagnosis not present

## 2013-06-24 DIAGNOSIS — G20 Parkinson's disease: Secondary | ICD-10-CM | POA: Diagnosis not present

## 2013-06-25 ENCOUNTER — Ambulatory Visit (INDEPENDENT_AMBULATORY_CARE_PROVIDER_SITE_OTHER): Payer: Medicare Other

## 2013-06-25 DIAGNOSIS — R55 Syncope and collapse: Secondary | ICD-10-CM | POA: Diagnosis not present

## 2013-06-25 DIAGNOSIS — S329XXA Fracture of unspecified parts of lumbosacral spine and pelvis, initial encounter for closed fracture: Secondary | ICD-10-CM | POA: Diagnosis not present

## 2013-06-25 DIAGNOSIS — R42 Dizziness and giddiness: Secondary | ICD-10-CM

## 2013-06-25 DIAGNOSIS — R269 Unspecified abnormalities of gait and mobility: Secondary | ICD-10-CM | POA: Diagnosis not present

## 2013-06-25 DIAGNOSIS — M6281 Muscle weakness (generalized): Secondary | ICD-10-CM | POA: Diagnosis not present

## 2013-06-25 DIAGNOSIS — Z8781 Personal history of (healed) traumatic fracture: Secondary | ICD-10-CM

## 2013-06-25 DIAGNOSIS — R296 Repeated falls: Secondary | ICD-10-CM

## 2013-06-25 DIAGNOSIS — R569 Unspecified convulsions: Secondary | ICD-10-CM

## 2013-06-25 DIAGNOSIS — Z9181 History of falling: Secondary | ICD-10-CM | POA: Diagnosis not present

## 2013-06-25 DIAGNOSIS — G20 Parkinson's disease: Secondary | ICD-10-CM

## 2013-06-25 DIAGNOSIS — I10 Essential (primary) hypertension: Secondary | ICD-10-CM | POA: Diagnosis not present

## 2013-06-25 DIAGNOSIS — R002 Palpitations: Secondary | ICD-10-CM

## 2013-06-25 DIAGNOSIS — R262 Difficulty in walking, not elsewhere classified: Secondary | ICD-10-CM | POA: Diagnosis not present

## 2013-06-25 NOTE — Procedures (Signed)
  History:  Kara Moore is a 78 year old patient with a history of diabetes and hypertension. The patient has a history of parkinsonism. The patient has had multiple falls, and a decline in motor skills and the ability to ambulate. The patient is being evaluated for this issue.  This is a routine EEG. No skull defects are noted. Medications include aspirin, calcium, Sinemet, Flonase, Lasix, Glucotrol, hydrocodone, Synthroid, Mevacor, Remeron, potassium, Zantac, Diovan, and vitamin D.  EEG classification: Normal awake and asleep  Description of the recording: The background rhythms of this recording consists of a fairly well modulated medium amplitude background activity of 8 Hz. As the record progresses, the patient initially is in the waking state, but appears to enter the early stage II sleep during the recording, with rudimentary sleep spindles and vertex sharp wave activity seen. During the wakeful state, photic stimulation is performed, and this results in a bilateral and symmetric photic driving response. Hyperventilation was not performed. At no time during the recording does there appear to be evidence of spike or spike wave discharges or evidence of focal slowing. EKG monitor shows no evidence of cardiac rhythm abnormalities with a heart rate of 66.  Impression: This is a normal EEG recording in the waking and sleeping state. No evidence of ictal or interictal discharges were seen at any time during the recording.

## 2013-06-25 NOTE — Progress Notes (Signed)
Quick Note:  Please call and advise the patient that the EEG or brain wave test we performed was reported as normal in the awake and sleep states. We checked for abnormal electrical discharges in the brain waves and the report suggested normal findings. No further action is required on this test at this time. Please remind patient to keep any upcoming appointments or tests and to call us with any interim questions, concerns, problems or updates. Thanks,  Shyana Kulakowski, MD, PhD    ______ 

## 2013-06-28 DIAGNOSIS — Z9181 History of falling: Secondary | ICD-10-CM | POA: Diagnosis not present

## 2013-06-28 DIAGNOSIS — I10 Essential (primary) hypertension: Secondary | ICD-10-CM | POA: Diagnosis not present

## 2013-06-28 DIAGNOSIS — S329XXA Fracture of unspecified parts of lumbosacral spine and pelvis, initial encounter for closed fracture: Secondary | ICD-10-CM | POA: Diagnosis not present

## 2013-06-28 DIAGNOSIS — G20 Parkinson's disease: Secondary | ICD-10-CM | POA: Diagnosis not present

## 2013-06-28 DIAGNOSIS — R262 Difficulty in walking, not elsewhere classified: Secondary | ICD-10-CM | POA: Diagnosis not present

## 2013-06-28 DIAGNOSIS — M6281 Muscle weakness (generalized): Secondary | ICD-10-CM | POA: Diagnosis not present

## 2013-06-28 NOTE — Progress Notes (Signed)
Quick Note:  Spoke to patient's niece and relayed normal EEG results, per Dr. Frances Furbish. ______

## 2013-06-29 DIAGNOSIS — M6281 Muscle weakness (generalized): Secondary | ICD-10-CM | POA: Diagnosis not present

## 2013-06-29 DIAGNOSIS — R262 Difficulty in walking, not elsewhere classified: Secondary | ICD-10-CM | POA: Diagnosis not present

## 2013-06-29 DIAGNOSIS — Z9181 History of falling: Secondary | ICD-10-CM | POA: Diagnosis not present

## 2013-06-29 DIAGNOSIS — I10 Essential (primary) hypertension: Secondary | ICD-10-CM | POA: Diagnosis not present

## 2013-06-29 DIAGNOSIS — G20 Parkinson's disease: Secondary | ICD-10-CM | POA: Diagnosis not present

## 2013-06-29 DIAGNOSIS — S329XXA Fracture of unspecified parts of lumbosacral spine and pelvis, initial encounter for closed fracture: Secondary | ICD-10-CM | POA: Diagnosis not present

## 2013-06-30 ENCOUNTER — Ambulatory Visit: Payer: Medicare Other | Admitting: Neurology

## 2013-07-01 ENCOUNTER — Encounter: Payer: Self-pay | Admitting: *Deleted

## 2013-07-01 ENCOUNTER — Encounter (INDEPENDENT_AMBULATORY_CARE_PROVIDER_SITE_OTHER): Payer: Medicare Other

## 2013-07-01 DIAGNOSIS — R269 Unspecified abnormalities of gait and mobility: Secondary | ICD-10-CM

## 2013-07-01 DIAGNOSIS — R569 Unspecified convulsions: Secondary | ICD-10-CM | POA: Diagnosis not present

## 2013-07-01 DIAGNOSIS — R002 Palpitations: Secondary | ICD-10-CM

## 2013-07-01 DIAGNOSIS — R55 Syncope and collapse: Secondary | ICD-10-CM

## 2013-07-01 DIAGNOSIS — M6281 Muscle weakness (generalized): Secondary | ICD-10-CM | POA: Diagnosis not present

## 2013-07-01 DIAGNOSIS — G20 Parkinson's disease: Secondary | ICD-10-CM | POA: Diagnosis not present

## 2013-07-01 DIAGNOSIS — R296 Repeated falls: Secondary | ICD-10-CM

## 2013-07-01 DIAGNOSIS — Z8781 Personal history of (healed) traumatic fracture: Secondary | ICD-10-CM

## 2013-07-01 DIAGNOSIS — R262 Difficulty in walking, not elsewhere classified: Secondary | ICD-10-CM | POA: Diagnosis not present

## 2013-07-01 DIAGNOSIS — Z9181 History of falling: Secondary | ICD-10-CM | POA: Diagnosis not present

## 2013-07-01 DIAGNOSIS — S329XXA Fracture of unspecified parts of lumbosacral spine and pelvis, initial encounter for closed fracture: Secondary | ICD-10-CM | POA: Diagnosis not present

## 2013-07-01 DIAGNOSIS — I10 Essential (primary) hypertension: Secondary | ICD-10-CM | POA: Diagnosis not present

## 2013-07-01 NOTE — Progress Notes (Signed)
Patient ID: Kara Moore, female   DOB: 1935/07/19, 79 y.o.   MRN: [id number] E-Cardio verite 30 day cardiac event monitor applied to patient.  Verbal and written instructions given to patient and her niece.  Written instructions sent with patient for [facility].

## 2013-07-02 DIAGNOSIS — Z9181 History of falling: Secondary | ICD-10-CM | POA: Diagnosis not present

## 2013-07-02 DIAGNOSIS — S329XXA Fracture of unspecified parts of lumbosacral spine and pelvis, initial encounter for closed fracture: Secondary | ICD-10-CM | POA: Diagnosis not present

## 2013-07-02 DIAGNOSIS — G20 Parkinson's disease: Secondary | ICD-10-CM | POA: Diagnosis not present

## 2013-07-02 DIAGNOSIS — I10 Essential (primary) hypertension: Secondary | ICD-10-CM | POA: Diagnosis not present

## 2013-07-02 DIAGNOSIS — R262 Difficulty in walking, not elsewhere classified: Secondary | ICD-10-CM | POA: Diagnosis not present

## 2013-07-02 DIAGNOSIS — M6281 Muscle weakness (generalized): Secondary | ICD-10-CM | POA: Diagnosis not present

## 2013-07-03 DIAGNOSIS — I10 Essential (primary) hypertension: Secondary | ICD-10-CM | POA: Diagnosis not present

## 2013-07-03 DIAGNOSIS — M6281 Muscle weakness (generalized): Secondary | ICD-10-CM | POA: Diagnosis not present

## 2013-07-03 DIAGNOSIS — G20 Parkinson's disease: Secondary | ICD-10-CM | POA: Diagnosis not present

## 2013-07-03 DIAGNOSIS — Z9181 History of falling: Secondary | ICD-10-CM | POA: Diagnosis not present

## 2013-07-03 DIAGNOSIS — R262 Difficulty in walking, not elsewhere classified: Secondary | ICD-10-CM | POA: Diagnosis not present

## 2013-07-03 DIAGNOSIS — S329XXA Fracture of unspecified parts of lumbosacral spine and pelvis, initial encounter for closed fracture: Secondary | ICD-10-CM | POA: Diagnosis not present

## 2013-07-05 DIAGNOSIS — S329XXA Fracture of unspecified parts of lumbosacral spine and pelvis, initial encounter for closed fracture: Secondary | ICD-10-CM | POA: Diagnosis not present

## 2013-07-05 DIAGNOSIS — I10 Essential (primary) hypertension: Secondary | ICD-10-CM | POA: Diagnosis not present

## 2013-07-05 DIAGNOSIS — R262 Difficulty in walking, not elsewhere classified: Secondary | ICD-10-CM | POA: Diagnosis not present

## 2013-07-05 DIAGNOSIS — Z9181 History of falling: Secondary | ICD-10-CM | POA: Diagnosis not present

## 2013-07-05 DIAGNOSIS — M6281 Muscle weakness (generalized): Secondary | ICD-10-CM | POA: Diagnosis not present

## 2013-07-05 DIAGNOSIS — G20 Parkinson's disease: Secondary | ICD-10-CM | POA: Diagnosis not present

## 2013-07-06 DIAGNOSIS — I10 Essential (primary) hypertension: Secondary | ICD-10-CM | POA: Diagnosis not present

## 2013-07-06 DIAGNOSIS — S329XXA Fracture of unspecified parts of lumbosacral spine and pelvis, initial encounter for closed fracture: Secondary | ICD-10-CM | POA: Diagnosis not present

## 2013-07-06 DIAGNOSIS — M6281 Muscle weakness (generalized): Secondary | ICD-10-CM | POA: Diagnosis not present

## 2013-07-06 DIAGNOSIS — R262 Difficulty in walking, not elsewhere classified: Secondary | ICD-10-CM | POA: Diagnosis not present

## 2013-07-06 DIAGNOSIS — Z9181 History of falling: Secondary | ICD-10-CM | POA: Diagnosis not present

## 2013-07-06 DIAGNOSIS — G20 Parkinson's disease: Secondary | ICD-10-CM | POA: Diagnosis not present

## 2013-07-07 DIAGNOSIS — I10 Essential (primary) hypertension: Secondary | ICD-10-CM | POA: Diagnosis not present

## 2013-07-07 DIAGNOSIS — R262 Difficulty in walking, not elsewhere classified: Secondary | ICD-10-CM | POA: Diagnosis not present

## 2013-07-07 DIAGNOSIS — Z9181 History of falling: Secondary | ICD-10-CM | POA: Diagnosis not present

## 2013-07-07 DIAGNOSIS — M6281 Muscle weakness (generalized): Secondary | ICD-10-CM | POA: Diagnosis not present

## 2013-07-07 DIAGNOSIS — S329XXA Fracture of unspecified parts of lumbosacral spine and pelvis, initial encounter for closed fracture: Secondary | ICD-10-CM | POA: Diagnosis not present

## 2013-07-07 DIAGNOSIS — G20 Parkinson's disease: Secondary | ICD-10-CM | POA: Diagnosis not present

## 2013-07-27 DIAGNOSIS — Z79899 Other long term (current) drug therapy: Secondary | ICD-10-CM | POA: Diagnosis not present

## 2013-07-27 DIAGNOSIS — Z0000 Encounter for general adult medical examination without abnormal findings: Secondary | ICD-10-CM | POA: Diagnosis not present

## 2013-08-18 DIAGNOSIS — E039 Hypothyroidism, unspecified: Secondary | ICD-10-CM | POA: Diagnosis not present

## 2013-08-18 DIAGNOSIS — G20 Parkinson's disease: Secondary | ICD-10-CM | POA: Diagnosis not present

## 2013-08-18 DIAGNOSIS — I10 Essential (primary) hypertension: Secondary | ICD-10-CM | POA: Diagnosis not present

## 2013-08-18 DIAGNOSIS — E119 Type 2 diabetes mellitus without complications: Secondary | ICD-10-CM | POA: Diagnosis not present

## 2013-08-30 ENCOUNTER — Ambulatory Visit (INDEPENDENT_AMBULATORY_CARE_PROVIDER_SITE_OTHER): Payer: Medicare Other | Admitting: Internal Medicine

## 2013-08-30 ENCOUNTER — Encounter: Payer: Self-pay | Admitting: Internal Medicine

## 2013-08-30 VITALS — BP 118/68 | HR 62 | Temp 97.6°F | Ht 63.5 in | Wt 153.5 lb

## 2013-08-30 DIAGNOSIS — M810 Age-related osteoporosis without current pathological fracture: Secondary | ICD-10-CM | POA: Diagnosis not present

## 2013-08-30 DIAGNOSIS — G20 Parkinson's disease: Secondary | ICD-10-CM | POA: Diagnosis not present

## 2013-08-30 DIAGNOSIS — G20A1 Parkinson's disease without dyskinesia, without mention of fluctuations: Secondary | ICD-10-CM | POA: Diagnosis not present

## 2013-08-30 DIAGNOSIS — R413 Other amnesia: Secondary | ICD-10-CM

## 2013-08-30 DIAGNOSIS — H3530 Unspecified macular degeneration: Secondary | ICD-10-CM

## 2013-08-30 DIAGNOSIS — E039 Hypothyroidism, unspecified: Secondary | ICD-10-CM

## 2013-08-30 DIAGNOSIS — Z9181 History of falling: Secondary | ICD-10-CM

## 2013-08-30 DIAGNOSIS — I10 Essential (primary) hypertension: Secondary | ICD-10-CM

## 2013-08-30 DIAGNOSIS — R296 Repeated falls: Secondary | ICD-10-CM

## 2013-08-30 DIAGNOSIS — E785 Hyperlipidemia, unspecified: Secondary | ICD-10-CM

## 2013-08-30 DIAGNOSIS — E119 Type 2 diabetes mellitus without complications: Secondary | ICD-10-CM | POA: Diagnosis not present

## 2013-08-30 NOTE — Progress Notes (Signed)
Patient ID: Kara Moore, female   DOB: [birth year], 78 y.o.   MRN: [id number] Location:  [facility] / [place] [facility]  Code Status: does not have DNR, living will, but does have HCPOA   No Known Allergies  Chief Complaint  Patient presents with  . Establish Care    NP   . Knee Pain    RT knee x     HPI: Patient is a 78 y.o. white female seen in the office today to establish with a geriatrician.  She is here with her two nieces.  From [place].  Lives in assisted living.  Wants to go home.  Admits she has been through a lot the past several months.  Had fall with bilateral pelvic fractures.  Original fall was in June--noted first side broken.  Second fall was in July in rehab--[facility]--when she went to write in her journal afterward, it wasn't proper writing.  Had been falling before June--around March of this year fell out of bed.  Also in yard and wash house.  Also fell Easter weekend.  Has perked back up last month or so.  Feels like she has her legs back.  Notes her feet had been swollen and it didn't feel right when she walked. Was started on lasix in April.  Is wearing compression hose.  Memory issues developed in context of pain.  Nephew passed away.  Memory may have improved--but not always per two nieces.    Original diabetes dx was 1999.  Glipizide was increased from 5 to 10mg  earlier this year.  Previously saw Dr. Mayford Knife at [facility].    Meds were being managed by her niece since March until she went to rehab and [facility].    Now sleeps well since she no longer has a roommate that yells out in the middle of the night.    Bowels usually move ok--sometimes a little help needed with medications.    Bladder works ok, but does have some leakage with coughing or sneezing.  No urgency.    Sinemet has helped tremors and walking considerably.    Concerned about getting in and out of bed on her own.  Was then falling out of bed.  Says this is  fine now.  She did have a life alert button which she used all except the last time when she had the pelvic fx and rhabdomyolysis afterwards.  Has family history PD--one family member with stimulator.  Also has some essential tremor in the family from what she tells me.  Has cataracts and macular degeneration.  Says she cannot see that well anymore.  Hearing ok.  Says she lost her sense of smell years ago.  Staying at [facility]--therapy time used up.  Had also plateaued at [facility]. Has continued to exercise and improve on her own with walking at [facility].  Is to use walker, but furniture surfs at times in her room.    No longer using hydrocodone, but is using tylenol.  Still has orders for hydrocodone, tylenol #3 and tylenol despite non-use.  Discussed risks of tylenol #3 being even worse than hydrocodone and pt notes that tylenol has been controlling her pain in her pelvis and her right knee.    Review of Systems:  Review of Systems  Constitutional: Negative for fever, chills and malaise/fatigue.  HENT: Negative for congestion and hearing loss.   Eyes: Positive for blurred vision.  Respiratory: Negative for shortness of breath.   Cardiovascular: Negative  for chest pain and palpitations.  Gastrointestinal: Positive for constipation. Negative for heartburn, nausea, vomiting, abdominal pain, diarrhea, blood in stool and melena.  Genitourinary: Negative for urgency and frequency.       Stress incontinence  Musculoskeletal: Positive for falls and joint pain.  Skin: Negative for rash.  Neurological: Positive for loss of consciousness. Negative for dizziness, focal weakness, seizures and weakness.  Endo/Heme/Allergies: Bruises/bleeds easily.  Psychiatric/Behavioral: Positive for hallucinations and memory loss. Negative for depression.       Hallucinations not recently but in context of pelvic fxs and rhabdo    Past Medical History  Diagnosis Date  . Hypertension   . Diabetes mellitus without  complication   . Hypercholesteremia   . Thyroid disease   . Memory loss   . Confusion   . Osteoporosis   . Macular degeneration    Past Surgical History  Procedure Laterality Date  . Adenoidectomy  2011    Social History:   reports that she has never smoked. She does not have any smokeless tobacco history on file. She reports that she does not drink alcohol or use illicit drugs.  Family History  Problem Relation Age of Onset  . Tremor Maternal Grandmother   . Diabetes Paternal Grandfather     Medications: Patient's Medications  New Prescriptions   No medications on file  Previous Medications   ACETAMINOPHEN (TYLENOL) 500 MG TABLET    Take 500 mg by mouth every 6 (six) hours as needed for pain. (STANDING ORDER) take 1 tablet evert 4 hours as need for fever up to 101.  headach and or minor discomfort.  ( don not exceed 2000 mg)  **MAX DOSE IS 3,000/24 HRS FROM ALL SOURCES88   ALUMINUM-MAGNESIUM HYDROXIDE 200-200 MG/5ML SUSPENSION    Take 5 mLs by mouth every 6 (six) hours as needed for indigestion. (standing order) 30 cc (2 TBSO) orally up to 4 times a day as needed for heartburn or indigestion ** not to exceed 4 doses in 24 hours**   ASPIRIN 81 MG CHEWABLE TABLET    Chew 81 mg by mouth daily. 1 tablet every other day   CALCIUM CARBONATE-VITAMIN D (CALCIUM-VITAMIN D) 500-200 MG-UNIT PER TABLET    Take 1 tablet by mouth daily.   CARBIDOPA-LEVODOPA (SINEMET IR) 25-100 MG PER TABLET    Take 0.5 tablets by mouth 3 (three) times daily. Follow titration instructions provided.   FLUTICASONE (FLONASE) 50 MCG/ACT NASAL SPRAY    Place 2 sprays into the nose daily.   FUROSEMIDE (LASIX) 20 MG TABLET    Take 20 mg by mouth 2 (two) times daily. Take 1/2 tablet orally every day   GLIPIZIDE (GLUCOTROL) 10 MG TABLET    Take 10 mg by mouth 2 (two) times daily before a meal.   GUAIFENESIN (ROBITUSSIN) 100 MG/5ML LIQUID    Take 100 mg by mouth 3 (three) times daily as needed for cough. (Standing Order)  2 tsp ( 10cc) orally evry six hours as need for co;ugh**NOT TO EXCEED 4 DOSES   HYDROCODONE-ACETAMINOPHEN (NORCO/VICODIN) 5-325 MG PER TABLET    Take 1 tablet by mouth every 6 (six) hours as needed for pain.   HYDROCORTISONE (ANUSOL-HC) 2.5 % RECTAL CREAM    Place rectally 2 (two) times daily. PRN   LEVOTHYROXINE (SYNTHROID, LEVOTHROID) 25 MCG TABLET    Take 25 mcg by mouth daily before breakfast.   LOPERAMIDE (IMODIUM) 2 MG CAPSULE    Take 2 mg by mouth 4 (four) times daily as needed  for diarrhea or loose stools. Take one tablet orally with each loose stool as need for diarrhea * NTE 8 doses in 24 hours*   LOSARTAN (COZAAR) 100 MG TABLET    Take 100 mg by mouth daily.   MAGNESIUM HYDROXIDE (MILK OF MAGNESIA) 400 MG/5ML SUSPENSION    Take by mouth daily as needed for constipation.   MIRTAZAPINE (REMERON) 15 MG TABLET    Take 15 mg by mouth at bedtime.   MULTIPLE VITAMINS-MINERALS (ICAPS MV) TABS    Take 2 tablets by mouth. Take 2 capsules daily   POTASSIUM CHLORIDE (K-DUR,KLOR-CON) 10 MEQ TABLET    Take 10 mEq by mouth daily.    RANITIDINE (ZANTAC) 150 MG CAPSULE    Take 150 mg by mouth as needed.    VITAMIN D, CHOLECALCIFEROL, PO    Take 1 tablet by mouth once a week.  Modified Medications   No medications on file  Discontinued Medications   ACETAMINOPHEN-CODEINE (TYLENOL WITH CODEINE #3 PO)    Take 1 tablet by mouth every 6 (six) hours.   LOVASTATIN (MEVACOR) 20 MG TABLET    Take 40 mg by mouth at bedtime.    VALSARTAN (DIOVAN) 160 MG TABLET    Take 160 mg by mouth daily.     Physical Exam: Filed Vitals:   08/30/13 0938  BP: 118/68  Pulse: 62  Temp: 97.6 F (36.4 C)  TempSrc: Oral  Height: 5' 3.5" (1.613 m)  Weight: 153 lb 8 oz (69.627 kg)  SpO2: 99%  Physical Exam  Constitutional: She is oriented to person, place, and time. She appears well-developed and well-nourished. No distress.  HENT:  Head: Normocephalic and atraumatic.  Right Ear: External ear normal.  Left Ear:  External ear normal.  Increased saliva  Eyes: EOM are normal. Pupils are equal, round, and reactive to light.  Neck: Neck supple.  Cardiovascular: Normal rate, regular rhythm, normal heart sounds and intact distal pulses.   Pulmonary/Chest: Effort normal and breath sounds normal. No respiratory distress.  Abdominal: Soft. Bowel sounds are normal. She exhibits no distension. There is no tenderness.  Musculoskeletal:  Slightly shuffling gait.  Left greater than right very mild resting tremor.  Left foot tap uncoordinated vs. Right.  Slight cogwheeling rigidity of left.  Stooped posture.  Kyphosis  Neurological: She is alert and oriented to person, place, and time. No cranial nerve deficit.  Skin: Skin is warm and dry. There is pallor.  Scar left knee;  Seborrheic dermatitis on face  Psychiatric:  Mildly flattened affect    Labs reviewed: 03/19/13:  Glucose 175, BUN 23, cr 0.71, Na 137, K 4.2, AST 14, ALT 22, alb 4, Ca 9.8, tc 163, HDL 68, LDL 85, TG 48  Past Procedures: 2008:  Colonoscopy/barium enema 2009:  Stress test 2012:  Last mammogram 2014:  CT scan, MRI, heart monitor Dr. Frances Furbish 2014:  Echo at [facility] 2014:  x[facility] due to falls at [facility]  Assessment/Plan 1. Senile osteoporosis - h/o pelvic fxs -Vitamin D, 25-hydroxy - continue vitamin D supplement   2. Parkinson's disease - discussed Parkinson's disease and its symptoms, expectations - emphasized importance of continuing to use her walker -cont to follow with Dr. Frances Furbish -cont sinemet which seems to have been quite effective for her  3. Memory loss due to medical condition - I suspect the majority of her previous memory difficulty was due to subacute delirium in the context of pelvic fx, pain meds, environmental changes, renal failure from  rhabdo, fluctuant blood glucose and volume status, etc. -her most recent MMSE score was 29/30 with 4/4 clock drawing and 14 on fluency testing -memory may  also have been influenced by death of a close family member - -r/o hypothyroidism, b12, folate deficiencies: -Comprehensive metabolic panel - TSH - B12 and Folate Panel  4. Type II or unspecified type diabetes mellitus without mention of complication, not stated as uncontrolled - Hemoglobin A1c due - last was fairly well controlled considering age (goal <8) with glipizide only--monitor for hypoglycemia - ? Some neuropathy--will do foot exam next time with EV/physical  5. Hypothyroidism -check TSH  6. Macular degeneration -will need regular f/u with ophthalmology due to poor vision so we can maintain her senses and help decrease risks of delirium  7. Hyperlipidemia LDL goal < 100 -lipids in May were at goal w/o medications -cont healthy diet and exercise  8. Benign essential hypertension -at goal with losartan 100mg  only  9.  Frequent falls -pt feels her balance is much better since therapy and sinemet -continue use of walker -d/c tylenol #3 and doubt she will use hydrocodone either, but I kept it available in case she has an episode of severe pain -will assess for neuropathy next visit -also not helped by poor vision--family encouraged to have her f/u with her ophthalmologist  Greater than 90 minutes were spent with the patient and her family today during this initial assessment. Labs/tests ordered:  Hba1c, cmp, tsh, b12/folate Next appt:  08/18/13

## 2013-08-31 ENCOUNTER — Encounter: Payer: Self-pay | Admitting: Internal Medicine

## 2013-08-31 DIAGNOSIS — M810 Age-related osteoporosis without current pathological fracture: Secondary | ICD-10-CM | POA: Insufficient documentation

## 2013-08-31 DIAGNOSIS — H3530 Unspecified macular degeneration: Secondary | ICD-10-CM | POA: Insufficient documentation

## 2013-08-31 DIAGNOSIS — E119 Type 2 diabetes mellitus without complications: Secondary | ICD-10-CM | POA: Insufficient documentation

## 2013-08-31 DIAGNOSIS — E785 Hyperlipidemia, unspecified: Secondary | ICD-10-CM | POA: Insufficient documentation

## 2013-08-31 DIAGNOSIS — E039 Hypothyroidism, unspecified: Secondary | ICD-10-CM | POA: Insufficient documentation

## 2013-08-31 DIAGNOSIS — R413 Other amnesia: Secondary | ICD-10-CM | POA: Insufficient documentation

## 2013-08-31 LAB — COMPREHENSIVE METABOLIC PANEL
ALT: 7 IU/L (ref 0–32)
AST: 11 IU/L (ref 0–40)
Albumin/Globulin Ratio: 2.3 (ref 1.1–2.5)
Albumin: 4.4 g/dL (ref 3.5–4.8)
Alkaline Phosphatase: 134 IU/L — ABNORMAL HIGH (ref 39–117)
BUN/Creatinine Ratio: 22 (ref 11–26)
BUN: 14 mg/dL (ref 8–27)
CO2: 24 mmol/L (ref 18–29)
Calcium: 9.8 mg/dL (ref 8.6–10.2)
Chloride: 100 mmol/L (ref 97–108)
Creatinine, Ser: 0.65 mg/dL (ref 0.57–1.00)
GFR calc Af Amer: 100 mL/min/{1.73_m2} (ref >59)
GFR calc non Af Amer: 87 mL/min/{1.73_m2} (ref >59)
Globulin, Total: 1.9 g/dL (ref 1.5–4.5)
Glucose: 122 mg/dL — ABNORMAL HIGH (ref 65–99)
Potassium: 4.8 mmol/L (ref 3.5–5.2)
Sodium: 139 mmol/L (ref 134–144)
Total Bilirubin: 0.3 mg/dL (ref 0.0–1.2)
Total Protein: 6.3 g/dL (ref 6.0–8.5)

## 2013-08-31 LAB — B12 AND FOLATE PANEL
Folate: 18.9 ng/mL (ref >3.0)
Vitamin B-12: 402 pg/mL (ref 211–946)

## 2013-08-31 LAB — CBC WITH DIFFERENTIAL/PLATELET
Basophils Absolute: 0 10*3/uL (ref 0.0–0.2)
Basos: 1 %
Eos: 3 %
Eosinophils Absolute: 0.2 10*3/uL (ref 0.0–0.4)
HCT: 42.1 % (ref 34.0–46.6)
Hemoglobin: 13.8 g/dL (ref 11.1–15.9)
Immature Grans (Abs): 0 10*3/uL (ref 0.0–0.1)
Immature Granulocytes: 0 %
Lymphocytes Absolute: 1.6 10*3/uL (ref 0.7–3.1)
Lymphs: 26 %
MCH: 30.1 pg (ref 26.6–33.0)
MCHC: 32.8 g/dL (ref 31.5–35.7)
MCV: 92 fL (ref 79–97)
Monocytes Absolute: 0.4 10*3/uL (ref 0.1–0.9)
Monocytes: 7 %
Neutrophils Absolute: 3.8 10*3/uL (ref 1.4–7.0)
Neutrophils Relative %: 63 %
RBC: 4.58 x10E6/uL (ref 3.77–5.28)
RDW: 14.6 % (ref 12.3–15.4)
WBC: 6.1 10*3/uL (ref 3.4–10.8)

## 2013-08-31 LAB — HEMOGLOBIN A1C
Est. average glucose Bld gHb Est-mCnc: 134 mg/dL
Hgb A1c MFr Bld: 6.3 % — ABNORMAL HIGH (ref 4.8–5.6)

## 2013-08-31 LAB — TSH: TSH: 4.99 u[IU]/mL — ABNORMAL HIGH (ref 0.450–4.500)

## 2013-08-31 LAB — VITAMIN D 25 HYDROXY (VIT D DEFICIENCY, FRACTURES): Vit D, 25-Hydroxy: 18.7 ng/mL — ABNORMAL LOW (ref 30.0–100.0)

## 2013-08-31 NOTE — Progress Notes (Signed)
Patient ID: Kara Moore, female   DOB: Aug 09, 1935, 78 y.o.   MRN: [id number] Reviewed EEG report which did not explain her memory loss or frequent falls. I do not see the cardiac monitoring results which her nieces say was done through [facility].

## 2013-09-09 ENCOUNTER — Ambulatory Visit (INDEPENDENT_AMBULATORY_CARE_PROVIDER_SITE_OTHER): Payer: Medicare Other | Admitting: Neurology

## 2013-09-09 ENCOUNTER — Encounter: Payer: Self-pay | Admitting: Neurology

## 2013-09-09 VITALS — BP 158/74 | HR 62 | Temp 97.9°F | Ht 63.5 in | Wt 155.0 lb

## 2013-09-09 DIAGNOSIS — G20 Parkinson's disease: Secondary | ICD-10-CM | POA: Diagnosis not present

## 2013-09-09 DIAGNOSIS — Z8781 Personal history of (healed) traumatic fracture: Secondary | ICD-10-CM | POA: Diagnosis not present

## 2013-09-09 DIAGNOSIS — R269 Unspecified abnormalities of gait and mobility: Secondary | ICD-10-CM | POA: Diagnosis not present

## 2013-09-09 DIAGNOSIS — IMO0001 Reserved for inherently not codable concepts without codable children: Secondary | ICD-10-CM

## 2013-09-09 DIAGNOSIS — G20A1 Parkinson's disease without dyskinesia, without mention of fluctuations: Secondary | ICD-10-CM

## 2013-09-09 DIAGNOSIS — Z9181 History of falling: Secondary | ICD-10-CM

## 2013-09-09 DIAGNOSIS — R569 Unspecified convulsions: Secondary | ICD-10-CM

## 2013-09-09 DIAGNOSIS — E559 Vitamin D deficiency, unspecified: Secondary | ICD-10-CM

## 2013-09-09 DIAGNOSIS — R296 Repeated falls: Secondary | ICD-10-CM

## 2013-09-09 DIAGNOSIS — G20C Parkinsonism, unspecified: Secondary | ICD-10-CM

## 2013-09-09 MED ORDER — VITAMIN D 50 MCG (2000 UT) PO CAPS: 2000.0000 [IU] | ORAL_CAPSULE | Freq: Every day | ORAL | Status: DC

## 2013-09-09 NOTE — Progress Notes (Signed)
Subjective:    Patient ID: Kara Moore is a 78 y.o. female.  HPI  Interim history:   Kara Moore is a very pleasant 78 year old right-handed woman with an underlying medical history of diabetes, hyperlipidemia, hypothyroidism, vitamin D deficiency, reflux disease, and hypertension and osteoporosis who presents for followup consultation of her parkinsonism, probable L sided predominant PD, and recurrent falls. She is accompanied by two nieces again today. I last saw her on 06/17/2013 after a recent fall and suggested further workup for a recent episode of confusion and her recurrent falls in the form of CTH, EEG, C. Doppler, cardiac monitor and suggested a referral to geriatrics. She has recently seen Dr. Renato Gails on 08/30/13, and I appreciate her help with the patient's care. Dr. Renato Gails recommended, she take vit D 2000 units daily as opposed to 5000 units weekly. Dr. Renato Gails also asked her to use her walker at all times.  Her head CT from 06/22/2013 was negative for any acute findings. Her EEG from 06/25/2013 was normal in the awake and sleep states, her carotid Doppler study from 06/25/2013 was negative for any significant stenoses. Her cardiac event monitor from 07/01/2013 through 07/30/2013 was consistent with normal sinus rhythm with occasional PVCs, no evidence of atrial fibrillation. This was interpreted by Dr. Willa Rough. She had an Echocardiogram on 04/15/13 at the [facility] in [place]. She has arthritis, in her R knee and had 2 injections before.  She reports feeling better, she has not had a recent fall since July. She uses a 2 wheeled walker, but not in the house. She lives at [facility] in [facility]. She feels like "I have legs again". She is tolerating the C/L 25/100 mg 1/2 tid.  I first met her on 03/26/2013 at which time her niece and the patient reported a approximately two-month history of decrease in fine motor skills, decrease in mobility, gait changes and recurrent  falls. She reported becoming slower than usual taking longer to dress herself, needing assistance with some of her ADLs. She was started on C/L at the time. She has had urinary urgency. She recently had physical therapy and her therapist noted lack of ability, inability to dress herself and drooling. The patient had to give up driving recently. She had a MRI brain on 5/22: Abnormal MRI scan of the brain showing mild changes of age-appropriate chronic microvascular ischemia and generalized cerebral atrophy.  On 02/11/13 she fell out of her bed and had difficulty getting back up. Her nephew was visiting from [place], but he could not hear her, as he is hard of hearing and she was calling in a soft voice. She developed rhabdomyolysis. In the latter part of April she was taken to [facility] in [place], then to the ER for suspected cellulitis. She had an US of the LEs and did not have a blood clot. She was given lasix for swelling. The next day she fell, and then again, and then again the next day. After these falls, she started having care 24/7 through [facility]. She has been in PT since the end of April. She has been using a 2 wheeled walker since then.  She has a strong FHx of tremors on her mother's side (including M, MGM and cousins). Both of her siblings passed away.  She never had a TIA or a Stroke, and denied sudden one sided weakness, numbness, tingling, slurring of speech or droopy face and denies recurrent headaches.  She had an aid come in 3 hours  each morning and in June she fell at home on 6/13, while coming out of the bathroom. She has been having issues with constipation and had taken Dulcolax suppositories, but also took Mag citrate and her niece found her on the floor and she had soiled herself and for some reason had not pushed her Lifeline button. She denies hitting her head or LOC. Her niece called 911 and she was taken to [facility] to the ER and was discharged to home from there. Then she was  admitted to inpt rehab at [facility] center on 05/02/13. She fell while in rehab and was found to have a pelvic Fx b/l, reportedly old. She has had some confusion.   Her Past Medical History Is Significant For: Past Medical History  Diagnosis Date  . Benign essential hypertension   . Diabetes mellitus without complication   . Hyperlipidemia LDL goal < 100   . Hypothyroidism   . Memory loss   . Confusion   . Senile osteoporosis   . Macular degeneration     Her Past Surgical History Is Significant For: Past Surgical History  Procedure Laterality Date  . Adenoidectomy  2011    Her Family History Is Significant For: Family History  Problem Relation Age of Onset  . Tremor Maternal Grandmother   . Diabetes Paternal Grandfather   . Heart attack Father   . Cancer Sister   . Heart attack Brother   . Diabetes Brother   . Tremor Maternal Aunt   . Diabetes Paternal Grandmother     Her Social History Is Significant For: History   Social History  . Marital Status: Unknown    Spouse Name: N/A    Number of Children: N/A  . Years of Education: N/A   Social History Main Topics  . Smoking status: Never Smoker   . Smokeless tobacco: None  . Alcohol Use: No  . Drug Use: No  . Sexual Activity: None   Other Topics Concern  . None   Social History Narrative  . None    Her Allergies Are:  No Known Allergies:   Her Current Medications Are:  Outpatient Encounter Prescriptions as of 09/09/2013  Medication Sig Dispense Refill  . acetaminophen (TYLENOL) 500 MG tablet Take 500 mg by mouth every 6 (six) hours as needed for pain. (STANDING ORDER) take 1 tablet evert 4 hours as need for fever up to 101.  headach and or minor discomfort.  ( don not exceed 2000 mg)  **MAX DOSE IS 3,000/24 HRS FROM ALL SOURCES88      . aluminum-magnesium hydroxide 200-200 MG/5ML suspension Take 5 mLs by mouth every 6 (six) hours as needed for indigestion. (standing order) 30 cc (2 TBSO) orally up  to 4 times a day as needed for heartburn or indigestion ** not to exceed 4 doses in 24 hours**      . aspirin 81 MG chewable tablet Chew 81 mg by mouth daily. 1 tablet every other day      . Calcium Carbonate-Vitamin D (CALCIUM-VITAMIN D) 500-200 MG-UNIT per tablet Take 1 tablet by mouth daily.      . carbidopa-levodopa (SINEMET IR) 25-100 MG per tablet Take 0.5 tablets by mouth 3 (three) times daily. Follow titration instructions provided.  45 tablet  3  . fluticasone (FLONASE) 50 MCG/ACT nasal spray Place 2 sprays into the nose daily.      . furosemide (LASIX) 20 MG tablet Take 20 mg by mouth 2 (two) times daily. Take  1/2 tablet orally every day      . glipiZIDE (GLUCOTROL) 10 MG tablet Take 10 mg by mouth 2 (two) times daily before a meal.      . guaiFENesin (ROBITUSSIN) 100 MG/5ML liquid Take 100 mg by mouth 3 (three) times daily as needed for cough. (Standing Order) 2 tsp ( 10cc) orally evry six hours as need for co;ugh**NOT TO EXCEED 4 DOSES      . HYDROcodone-acetaminophen (NORCO/VICODIN) 5-325 MG per tablet Take 1 tablet by mouth every 6 (six) hours as needed for pain.      . hydrocortisone (ANUSOL-HC) 2.5 % rectal cream Place rectally 2 (two) times daily. PRN      . levothyroxine (SYNTHROID, LEVOTHROID) 25 MCG tablet Take 25 mcg by mouth daily before breakfast.      . loperamide (IMODIUM) 2 MG capsule Take 2 mg by mouth 4 (four) times daily as needed for diarrhea or loose stools. Take one tablet orally with each loose stool as need for diarrhea * NTE 8 doses in 24 hours*      . losartan (COZAAR) 100 MG tablet Take 100 mg by mouth daily.      Marland Kitchen lovastatin (MEVACOR) 40 MG tablet Take 40 mg by mouth at bedtime.      . magnesium hydroxide (MILK OF MAGNESIA) 400 MG/5ML suspension Take by mouth daily as needed for constipation.      . mirtazapine (REMERON) 15 MG tablet Take 15 mg by mouth at bedtime.      . Multiple Vitamins-Minerals (ICAPS MV) TABS Take 2 tablets by mouth. Take 2 capsules daily       . potassium chloride (K-DUR,KLOR-CON) 10 MEQ tablet Take 10 mEq by mouth daily.       . ranitidine (ZANTAC) 150 MG capsule Take 150 mg by mouth as needed.       Marland Kitchen VITAMIN D, CHOLECALCIFEROL, PO Take 1 tablet by mouth once a week.       No facility-administered encounter medications on file as of 09/09/2013.  :  Review of Systems:  Out of a complete 14 point review of systems, all are reviewed and negative with the exception of these symptoms as listed below:   Review of Systems  Constitutional: Negative.   HENT: Negative.   Eyes: Negative.   Respiratory: Negative.   Cardiovascular: Negative.   Gastrointestinal: Negative.   Endocrine: Negative.   Genitourinary: Negative.   Musculoskeletal: Positive for arthralgias.  Skin: Negative.   Allergic/Immunologic: Negative.   Neurological: Negative.   Hematological: Negative.   Psychiatric/Behavioral: Negative.     Objective:  Neurologic Exam  Physical Exam Physical Examination:   Filed Vitals:   09/09/13 1147  BP: 158/74  Pulse: 62  Temp: 97.9 F (36.6 C)    General Examination: The patient is a very pleasant 78 y.o. female in no acute distress. She is calm and cooperative with the exam. She denies Auditory Hallucinations and Visual Hallucinations.   HEENT: Normocephalic, atraumatic, pupils are equal, round and reactive to light and accommodation. Extraocular tracking shows moderate saccadic breakdown without nystagmus noted. Hearing is intact. Face is symmetric with moderate facial masking and normal facial sensation. Excess seborrhoic changes are seen on her face. There is no lip, neck or jaw tremor. Neck is moderately rigid with intact passive ROM. There are no carotid bruits on auscultation. Oropharynx exam reveals mild mouth dryness. No significant airway crowding is noted. Mallampati is class II. Tongue protrudes centrally and palate elevates symmetrically. Mild drooling is noted.  Chest: is clear to auscultation without  wheezing, rhonchi or crackles noted.  Heart: sounds are regular and normal with a 1/6 early systolic murmur, no rubs or gallops noted.   Abdomen: is soft, non-tender and non-distended with normal bowel sounds appreciated on auscultation.  Extremities: There is 1+ pitting edema in the distal lower extremities bilaterally. She is wearing compression stockings up to the knees. Pedal pulses are intact.  Skin: is warm and dry with no trophic changes noted.   Musculoskeletal: exam reveals no obvious joint deformities, tenderness or joint swelling or erythema.  Neurologically:  Mental status: The patient is awake and alert, paying good  attention. She is able to partially provide the history. Her nieces provides details. She is oriented to: person, place, time/date, situation, day of week, month of year and year. Her memory, attention, language and knowledge are impaired, mildly. There is no aphasia, agnosia, apraxia or anomia. There is a mild degree of bradyphrenia. Speech is mildly hypophonic with no dysarthria noted. Mood is congruent and affect is normal.   Her MMSE score was 29/30. CDT 4/4. AFT (Animal Fluency Test) score was 14 - in 5/14.  Cranial nerves are as described above under HEENT exam. In addition, shoulder shrug is normal with equal shoulder height noted.  Motor exam: Normal bulk, and strength for age is noted. Tone is mildly rigid with presence of cogwheeling in the right upper extremity. There is overall moderate bradykinesia. There is no drift or rebound. There is no resting tremor in the RUE, but she has a subtle UE postural tremor b/l and a slight intermittent rest tremor in the LUE.   Romberg is negative. Reflexes are 1+ in the upper extremities and 1+ in the lower extremities.Fine motor skills: Finger taps, hand movements, and rapid alternating patting are moderately impaired on the L and mild to moderately impaired on the R. Foot taps and foot agility are moderately impaired  bilaterally, L worse than R.    Cerebellar testing shows no dysmetria or intention tremor on finger to nose testing. There is no truncal or gait ataxia.   Sensory exam is intact to light touch.   Gait, station and balance: she stands with mild difficulty and pushes herself up. She has a moderately stooped posture. She uses her 2 wheeled walker well, but is insecure with turns when she walks without it. She has slight instability with sitting down on her own. Her balance is mild to moderately impaired.   Assessment and Plan:   In summary, Kara Moore is a very pleasant 78 y.o.-year old female with a history of slowness, stiffness, recurrent falls, problems with fine motor skills and posture as well as drooling. Her history and physical exam are most consistent with left-sided predominant Parkinson's disease vs parkinsonism, and she has a significant gait disorder with recurrent falls, which seem out of proportion to her parkinsonism. Her gait disorder is likely a function of multiple additional issues including advancing age, previous pelvic fracture, arthritis. She currently stays in an assisted living and is hoping to move back into her own home but this will be exceedingly difficult as time goes by I believe. I had a long conversation with the patient and her nieces today. She has finished inpatient rehabilitation. She has had recent additional testing including CTH, EEG, C. Doppler, cardiac monitor, all of which were unrevealing and she has established care with Dr. Renato Gails in geriatrics. I suggested that she continue to use her 2 wheeled walker at all  times. She is very reluctant to do this in her apartment. She agrees to using a walker outside of her apartment. She is advised that she is still at fall risk. Generally speaking, I do believe she has done better and motor-wise she is better. She continues to tolerate Sinemet at low dose and I would like to keep it at this dose. I did provide her with a  new prescription for vitamin D2 thousand units once daily as recommended by Dr. Renato Gails since she needed a prescription to implement this at her assisted living facility. assistant living facility to continue with physical and occupational therapy at this time. Since she has done better lately, I would like to see her back in about 4 months from now, sooner if the need arises. Most of my 45 minute visit today was spent in counseling and coordination of care, reviewing test results and reviewing medication.

## 2013-09-09 NOTE — Patient Instructions (Addendum)
I think overall you are doing fairly well but I do want to suggest a few things today:  Remember to drink plenty of fluid, eat healthy meals and do not skip any meals. Try to eat protein with a every meal and eat a healthy snack such as fruit or nuts in between meals. Try to keep a regular sleep-wake schedule and try to exercise daily, particularly in the form of walking, 20-30 minutes a day, if you can.   Use your walker at all times. You are at risk for falls!  Engage in social activities in your community and with your family and try to keep up with current events by reading the newspaper or watching the news.   As far as your medications are concerned, I would like to suggest no changes in the Sinemet. I have changed your Vitamin D to 2000 units daily.  As far as diagnostic testing: no new test needed at this time.  I would like to see you back in 4 months, sooner if we need to. Please call us with any interim questions, concerns, problems, updates or refill requests.  Please also call us for any test results so we can go over those with you on the phone. Brett Canales is my clinical assistant and will answer any of your questions and relay your messages to me and also relay most of my messages to you.  Our phone number is [phone]. We also have an after hours call service for urgent matters and there is a physician on-call for urgent questions. For any emergencies you know to call 911 or go to the nearest emergency room.

## 2013-09-13 DIAGNOSIS — IMO0001 Reserved for inherently not codable concepts without codable children: Secondary | ICD-10-CM | POA: Diagnosis not present

## 2013-09-13 DIAGNOSIS — F411 Generalized anxiety disorder: Secondary | ICD-10-CM | POA: Diagnosis not present

## 2013-09-13 DIAGNOSIS — R269 Unspecified abnormalities of gait and mobility: Secondary | ICD-10-CM | POA: Diagnosis not present

## 2013-09-13 DIAGNOSIS — M6281 Muscle weakness (generalized): Secondary | ICD-10-CM | POA: Diagnosis not present

## 2013-09-13 DIAGNOSIS — E119 Type 2 diabetes mellitus without complications: Secondary | ICD-10-CM | POA: Diagnosis not present

## 2013-09-13 DIAGNOSIS — Z9181 History of falling: Secondary | ICD-10-CM | POA: Diagnosis not present

## 2013-09-13 DIAGNOSIS — I10 Essential (primary) hypertension: Secondary | ICD-10-CM | POA: Diagnosis not present

## 2013-09-13 DIAGNOSIS — G20 Parkinson's disease: Secondary | ICD-10-CM | POA: Diagnosis not present

## 2013-09-13 DIAGNOSIS — Z8781 Personal history of (healed) traumatic fracture: Secondary | ICD-10-CM | POA: Diagnosis not present

## 2013-09-15 DIAGNOSIS — I10 Essential (primary) hypertension: Secondary | ICD-10-CM | POA: Diagnosis not present

## 2013-09-15 DIAGNOSIS — IMO0001 Reserved for inherently not codable concepts without codable children: Secondary | ICD-10-CM | POA: Diagnosis not present

## 2013-09-15 DIAGNOSIS — R269 Unspecified abnormalities of gait and mobility: Secondary | ICD-10-CM | POA: Diagnosis not present

## 2013-09-15 DIAGNOSIS — G20 Parkinson's disease: Secondary | ICD-10-CM | POA: Diagnosis not present

## 2013-09-15 DIAGNOSIS — M6281 Muscle weakness (generalized): Secondary | ICD-10-CM | POA: Diagnosis not present

## 2013-09-15 DIAGNOSIS — E119 Type 2 diabetes mellitus without complications: Secondary | ICD-10-CM | POA: Diagnosis not present

## 2013-09-21 DIAGNOSIS — G20 Parkinson's disease: Secondary | ICD-10-CM | POA: Diagnosis not present

## 2013-09-21 DIAGNOSIS — R269 Unspecified abnormalities of gait and mobility: Secondary | ICD-10-CM | POA: Diagnosis not present

## 2013-09-21 DIAGNOSIS — I10 Essential (primary) hypertension: Secondary | ICD-10-CM | POA: Diagnosis not present

## 2013-09-21 DIAGNOSIS — IMO0001 Reserved for inherently not codable concepts without codable children: Secondary | ICD-10-CM | POA: Diagnosis not present

## 2013-09-21 DIAGNOSIS — E119 Type 2 diabetes mellitus without complications: Secondary | ICD-10-CM | POA: Diagnosis not present

## 2013-09-21 DIAGNOSIS — M6281 Muscle weakness (generalized): Secondary | ICD-10-CM | POA: Diagnosis not present

## 2013-09-23 ENCOUNTER — Other Ambulatory Visit: Payer: Self-pay

## 2013-09-24 DIAGNOSIS — IMO0001 Reserved for inherently not codable concepts without codable children: Secondary | ICD-10-CM | POA: Diagnosis not present

## 2013-09-24 DIAGNOSIS — G20 Parkinson's disease: Secondary | ICD-10-CM | POA: Diagnosis not present

## 2013-09-24 DIAGNOSIS — R269 Unspecified abnormalities of gait and mobility: Secondary | ICD-10-CM | POA: Diagnosis not present

## 2013-09-24 DIAGNOSIS — M6281 Muscle weakness (generalized): Secondary | ICD-10-CM | POA: Diagnosis not present

## 2013-09-24 DIAGNOSIS — I10 Essential (primary) hypertension: Secondary | ICD-10-CM | POA: Diagnosis not present

## 2013-09-24 DIAGNOSIS — E119 Type 2 diabetes mellitus without complications: Secondary | ICD-10-CM | POA: Diagnosis not present

## 2013-09-27 DIAGNOSIS — Z23 Encounter for immunization: Secondary | ICD-10-CM | POA: Diagnosis not present

## 2013-09-28 DIAGNOSIS — I10 Essential (primary) hypertension: Secondary | ICD-10-CM | POA: Diagnosis not present

## 2013-09-28 DIAGNOSIS — M6281 Muscle weakness (generalized): Secondary | ICD-10-CM | POA: Diagnosis not present

## 2013-09-28 DIAGNOSIS — IMO0001 Reserved for inherently not codable concepts without codable children: Secondary | ICD-10-CM | POA: Diagnosis not present

## 2013-09-28 DIAGNOSIS — G20 Parkinson's disease: Secondary | ICD-10-CM | POA: Diagnosis not present

## 2013-09-28 DIAGNOSIS — E119 Type 2 diabetes mellitus without complications: Secondary | ICD-10-CM | POA: Diagnosis not present

## 2013-09-28 DIAGNOSIS — R269 Unspecified abnormalities of gait and mobility: Secondary | ICD-10-CM | POA: Diagnosis not present

## 2013-09-30 DIAGNOSIS — I10 Essential (primary) hypertension: Secondary | ICD-10-CM | POA: Diagnosis not present

## 2013-09-30 DIAGNOSIS — E119 Type 2 diabetes mellitus without complications: Secondary | ICD-10-CM | POA: Diagnosis not present

## 2013-09-30 DIAGNOSIS — IMO0001 Reserved for inherently not codable concepts without codable children: Secondary | ICD-10-CM | POA: Diagnosis not present

## 2013-09-30 DIAGNOSIS — G20 Parkinson's disease: Secondary | ICD-10-CM | POA: Diagnosis not present

## 2013-09-30 DIAGNOSIS — R269 Unspecified abnormalities of gait and mobility: Secondary | ICD-10-CM | POA: Diagnosis not present

## 2013-09-30 DIAGNOSIS — M6281 Muscle weakness (generalized): Secondary | ICD-10-CM | POA: Diagnosis not present

## 2013-10-05 DIAGNOSIS — M6281 Muscle weakness (generalized): Secondary | ICD-10-CM | POA: Diagnosis not present

## 2013-10-05 DIAGNOSIS — R269 Unspecified abnormalities of gait and mobility: Secondary | ICD-10-CM | POA: Diagnosis not present

## 2013-10-05 DIAGNOSIS — I10 Essential (primary) hypertension: Secondary | ICD-10-CM | POA: Diagnosis not present

## 2013-10-05 DIAGNOSIS — E119 Type 2 diabetes mellitus without complications: Secondary | ICD-10-CM | POA: Diagnosis not present

## 2013-10-05 DIAGNOSIS — IMO0001 Reserved for inherently not codable concepts without codable children: Secondary | ICD-10-CM | POA: Diagnosis not present

## 2013-10-05 DIAGNOSIS — G20 Parkinson's disease: Secondary | ICD-10-CM | POA: Diagnosis not present

## 2013-10-07 DIAGNOSIS — R269 Unspecified abnormalities of gait and mobility: Secondary | ICD-10-CM | POA: Diagnosis not present

## 2013-10-07 DIAGNOSIS — G20 Parkinson's disease: Secondary | ICD-10-CM | POA: Diagnosis not present

## 2013-10-07 DIAGNOSIS — I10 Essential (primary) hypertension: Secondary | ICD-10-CM | POA: Diagnosis not present

## 2013-10-07 DIAGNOSIS — E119 Type 2 diabetes mellitus without complications: Secondary | ICD-10-CM | POA: Diagnosis not present

## 2013-10-07 DIAGNOSIS — M6281 Muscle weakness (generalized): Secondary | ICD-10-CM | POA: Diagnosis not present

## 2013-10-07 DIAGNOSIS — IMO0001 Reserved for inherently not codable concepts without codable children: Secondary | ICD-10-CM | POA: Diagnosis not present

## 2013-10-11 DIAGNOSIS — E119 Type 2 diabetes mellitus without complications: Secondary | ICD-10-CM | POA: Diagnosis not present

## 2013-10-11 DIAGNOSIS — R269 Unspecified abnormalities of gait and mobility: Secondary | ICD-10-CM | POA: Diagnosis not present

## 2013-10-11 DIAGNOSIS — G20 Parkinson's disease: Secondary | ICD-10-CM | POA: Diagnosis not present

## 2013-10-11 DIAGNOSIS — IMO0001 Reserved for inherently not codable concepts without codable children: Secondary | ICD-10-CM | POA: Diagnosis not present

## 2013-10-11 DIAGNOSIS — M6281 Muscle weakness (generalized): Secondary | ICD-10-CM | POA: Diagnosis not present

## 2013-10-11 DIAGNOSIS — I10 Essential (primary) hypertension: Secondary | ICD-10-CM | POA: Diagnosis not present

## 2013-10-13 DIAGNOSIS — M6281 Muscle weakness (generalized): Secondary | ICD-10-CM | POA: Diagnosis not present

## 2013-10-13 DIAGNOSIS — IMO0001 Reserved for inherently not codable concepts without codable children: Secondary | ICD-10-CM | POA: Diagnosis not present

## 2013-10-13 DIAGNOSIS — G20 Parkinson's disease: Secondary | ICD-10-CM | POA: Diagnosis not present

## 2013-10-13 DIAGNOSIS — R269 Unspecified abnormalities of gait and mobility: Secondary | ICD-10-CM | POA: Diagnosis not present

## 2013-10-13 DIAGNOSIS — I10 Essential (primary) hypertension: Secondary | ICD-10-CM | POA: Diagnosis not present

## 2013-10-13 DIAGNOSIS — E119 Type 2 diabetes mellitus without complications: Secondary | ICD-10-CM | POA: Diagnosis not present

## 2013-10-18 ENCOUNTER — Ambulatory Visit (INDEPENDENT_AMBULATORY_CARE_PROVIDER_SITE_OTHER): Payer: Medicare Other | Admitting: Internal Medicine

## 2013-10-18 ENCOUNTER — Encounter: Payer: Self-pay | Admitting: Internal Medicine

## 2013-10-18 VITALS — BP 122/74 | HR 63 | Temp 97.0°F | Wt 161.0 lb

## 2013-10-18 DIAGNOSIS — M810 Age-related osteoporosis without current pathological fracture: Secondary | ICD-10-CM | POA: Diagnosis not present

## 2013-10-18 DIAGNOSIS — E119 Type 2 diabetes mellitus without complications: Secondary | ICD-10-CM

## 2013-10-18 DIAGNOSIS — G20A1 Parkinson's disease without dyskinesia, without mention of fluctuations: Secondary | ICD-10-CM | POA: Diagnosis not present

## 2013-10-18 DIAGNOSIS — I10 Essential (primary) hypertension: Secondary | ICD-10-CM

## 2013-10-18 DIAGNOSIS — Z9181 History of falling: Secondary | ICD-10-CM | POA: Diagnosis not present

## 2013-10-18 DIAGNOSIS — G20 Parkinson's disease: Secondary | ICD-10-CM

## 2013-10-18 DIAGNOSIS — R296 Repeated falls: Secondary | ICD-10-CM

## 2013-10-18 MED ORDER — GLIPIZIDE 5 MG PO TABS: 5.0000 mg | ORAL_TABLET | Freq: Every day | ORAL | Status: DC

## 2013-10-18 NOTE — Progress Notes (Signed)
Patient ID: Kara Moore, female   DOB: 05/30/1935, 78 y.o.   MRN: [id number]   Location:  [facility]   No Known Allergies  Chief Complaint  Patient presents with  . Medical Managment of Chronic Issues    6 week follow-up, see BloodSugar log     HPI: Patient is a 78 y.o. white female seen in the office today for follow up of diabetes, Parkinson's disease, right knee oa, memory loss (seems much better with delirium cleared--family notes stable)  Does eat sweets.  Glucose readings have been less than 100 much of the time.  Had one low at 68.  No s/s of hypoglycemia otherwise.    Right knee is worse--having to take tylenol quite often due to pain in it.  Doctor at [facility] ordered PT for her and therapist gave her permission to not use walker when in facility.  Was told to use it on uneven surfaces, but in facility to walk near wall.    No notable memory changes since last visit.  Some days she has worsening of her tremor.  Pt says it is worse when she is tired.    Review of Systems:  Review of Systems  Constitutional: Negative for fever, chills and weight loss.  HENT: Negative for congestion.   Eyes: Negative for blurred vision.  Respiratory: Negative for shortness of breath.   Cardiovascular: Negative for chest pain, palpitations and leg swelling.       Wearing compression hose with benefit  Gastrointestinal: Negative for abdominal pain, diarrhea, constipation, blood in stool and melena.  Genitourinary: Negative for dysuria, urgency and frequency.  Musculoskeletal: Positive for joint pain. Negative for falls.  Skin:       Seborrheic dermatitis of face  Neurological: Positive for dizziness. Negative for loss of consciousness and headaches.  Endo/Heme/Allergies: Bruises/bleeds easily.  Psychiatric/Behavioral: Positive for depression and memory loss.       Flat affect     Past Medical History  Diagnosis Date  . Benign essential  hypertension   . Diabetes mellitus without complication   . Hyperlipidemia LDL goal < 100   . Hypothyroidism   . Memory loss   . Confusion   . Senile osteoporosis   . Macular degeneration     Past Surgical History  Procedure Laterality Date  . Adenoidectomy  2011    Social History:   reports that she has never smoked. She does not have any smokeless tobacco history on file. She reports that she does not drink alcohol or use illicit drugs.  Family History  Problem Relation Age of Onset  . Tremor Maternal Grandmother   . Diabetes Paternal Grandfather   . Heart attack Father   . Cancer Sister   . Heart attack Brother   . Diabetes Brother   . Tremor Maternal Aunt   . Diabetes Paternal Grandmother     Medications: Patient's Medications  New Prescriptions   No medications on file  Previous Medications   ACETAMINOPHEN (TYLENOL) 500 MG TABLET    Take 500 mg by mouth. (STANDING ORDER) take 1 tablet evert 4 hours as need for fever up to 101.  headach and or minor discomfort.  ( don not exceed 2000 mg)  **MAX DOSE IS 3,000/24 HRS FROM ALL SOURCES88   ALUMINUM-MAGNESIUM HYDROXIDE 200-200 MG/5ML SUSPENSION    Take 5 mLs by mouth. (standing order) 30 cc (2 TBSO) orally up to 4 times a day as needed for heartburn  or indigestion ** not to exceed 4 doses in 24 hours**   ASPIRIN 81 MG CHEWABLE TABLET    Chew 81 mg by mouth daily. 1 tablet every other day   CALCIUM CARBONATE-VITAMIN D (CALCIUM-VITAMIN D) 500-200 MG-UNIT PER TABLET    Take 1 tablet by mouth daily.   CARBIDOPA-LEVODOPA (SINEMET IR) 25-100 MG PER TABLET    Take 0.5 tablets by mouth 3 (three) times daily. Follow titration instructions provided.   CHOLECALCIFEROL (VITAMIN D) 2000 UNITS CAPS    Take 1 capsule (2,000 Units total) by mouth daily.   FLUTICASONE (FLONASE) 50 MCG/ACT NASAL SPRAY    Place 2 sprays into the nose daily.   FUROSEMIDE (LASIX) 20 MG TABLET    Take 20 mg by mouth. Take 1/2 tablet orally every day   GLIPIZIDE  (GLUCOTROL) 10 MG TABLET    Take 10 mg by mouth daily before breakfast.    GUAIFENESIN (ROBITUSSIN) 100 MG/5ML LIQUID    Take 100 mg by mouth. (Standing Order) 2 tsp ( 10cc) orally evry six hours as need for co;ugh**NOT TO EXCEED 4 DOSES   HYDROCODONE-ACETAMINOPHEN (NORCO/VICODIN) 5-325 MG PER TABLET    Take 1 tablet by mouth every 6 (six) hours as needed for pain.   HYDROCORTISONE (ANUSOL-HC) 2.5 % RECTAL CREAM    Place rectally 2 (two) times daily. PRN   LEVOTHYROXINE (SYNTHROID, LEVOTHROID) 25 MCG TABLET    Take 25 mcg by mouth daily before breakfast.   LOPERAMIDE (IMODIUM) 2 MG CAPSULE    Take 2 mg by mouth. Take one tablet orally with each loose stool as need for diarrhea * NTE 8 doses in 24 hours*   MAGNESIUM HYDROXIDE (MILK OF MAGNESIA) 400 MG/5ML SUSPENSION    Take by mouth daily as needed for constipation.   MIRTAZAPINE (REMERON) 15 MG TABLET    Take 15 mg by mouth at bedtime.   MULTIPLE VITAMINS-MINERALS (ICAPS MV) TABS    Take 2 tablets by mouth. Take 2 capsules daily   POTASSIUM CHLORIDE (K-DUR,KLOR-CON) 10 MEQ TABLET    Take 10 mEq by mouth daily.    RANITIDINE (ZANTAC) 150 MG CAPSULE    Take 150 mg by mouth as needed.    TRAZODONE (DESYREL) 150 MG TABLET    Take 50 mg by mouth at bedtime as needed for sleep.  Modified Medications   No medications on file  Discontinued Medications   LOSARTAN (COZAAR) 100 MG TABLET    Take 100 mg by mouth daily.   LOVASTATIN (MEVACOR) 40 MG TABLET    Take 40 mg by mouth at bedtime.   VITAMIN D, CHOLECALCIFEROL, PO    Take 1 tablet by mouth once a week.     Physical Exam: Filed Vitals:   10/18/13 1122  BP: 122/74  Pulse: 63  Temp: 97 F (36.1 C)  TempSrc: Oral  Weight: 161 lb (73.029 kg)  SpO2: 99%  Physical Exam  Constitutional: She is oriented to person, place, and time. She appears well-developed and well-nourished. No distress.  HENT:  Head: Normocephalic and atraumatic.  Cardiovascular: Normal rate, regular rhythm, normal heart  sounds and intact distal pulses.   Pulmonary/Chest: Effort normal and breath sounds normal.  Abdominal: Soft. Bowel sounds are normal. She exhibits no distension. There is no tenderness.  Musculoskeletal: Normal range of motion. She exhibits no edema and no tenderness.  Unsteady on standing, stooped posture, kyphosis  Neurological: She is alert and oriented to person, place, and time. No cranial nerve deficit. She exhibits abnormal  muscle tone.  Tremor left greater than right UE  Skin: Skin is warm and dry.  Erythema of cheeks bilaterally with some peeling present     Labs reviewed: Basic Metabolic Panel:  Recent Labs  16/10/96 1100  NA 139  K 4.8  CL 100  CO2 24  GLUCOSE 122*  BUN 14  CREATININE 0.65  CALCIUM 9.8  TSH 4.990*   Liver Function Tests:  Recent Labs  08/30/13 1100  AST 11  ALT 7  ALKPHOS 134*  BILITOT 0.3  PROT 6.3  CBC:  Recent Labs  08/30/13 1100  WBC 6.1  NEUTROABS 3.8  HGB 13.8  HCT 42.1  MCV 92   Lab Results  Component Value Date   HGBA1C 6.3* 08/30/2013  Assessment/Plan 1. Type II or unspecified type diabetes mellitus without mention of complication, not stated as uncontrolled -reduce glipizide to 5mg  daily from 10mg  due to episode of hypoglycemia and generally low readings considering comorbidities -cont ac breakfast cbgs  2. Parkinson's disease -tremors worsen with stress -following with Dr. Frances Furbish (help with discussing prognosis and fall risk appreciated as pt is having difficulty accepting that she must use walker) -walker use encouraged and benefits explained at length with her and her nieces  3. Senile osteoporosis -somehow she has wound up on ca with D and no longer on vit D 2000 units daily -seems the physician or NP at her facility may also be seeing her?  -encouraged exercise with walking and participating in activities  4. Recurrent falls -must use walker, apparently was told by a therapist that this is not necessary,  but, with her progressive disease, this will benefit her significantly  5. Benign essential hypertension -at goal with current therapy -monitor for orthostatic hypotension  Labs/tests ordered:  Cbc, bmp, flp at facility to be faxed to be one week before appt Next appt:  3 mos

## 2013-10-18 NOTE — Patient Instructions (Signed)
Please get labs drawn before next visit in 3 mos

## 2013-10-19 DIAGNOSIS — G20A1 Parkinson's disease without dyskinesia, without mention of fluctuations: Secondary | ICD-10-CM | POA: Insufficient documentation

## 2013-10-19 DIAGNOSIS — G20 Parkinson's disease: Secondary | ICD-10-CM | POA: Insufficient documentation

## 2013-10-20 DIAGNOSIS — G20 Parkinson's disease: Secondary | ICD-10-CM | POA: Diagnosis not present

## 2013-10-20 DIAGNOSIS — M6281 Muscle weakness (generalized): Secondary | ICD-10-CM | POA: Diagnosis not present

## 2013-10-20 DIAGNOSIS — R269 Unspecified abnormalities of gait and mobility: Secondary | ICD-10-CM | POA: Diagnosis not present

## 2013-10-20 DIAGNOSIS — E119 Type 2 diabetes mellitus without complications: Secondary | ICD-10-CM | POA: Diagnosis not present

## 2013-10-20 DIAGNOSIS — I10 Essential (primary) hypertension: Secondary | ICD-10-CM | POA: Diagnosis not present

## 2013-10-20 DIAGNOSIS — IMO0001 Reserved for inherently not codable concepts without codable children: Secondary | ICD-10-CM | POA: Diagnosis not present

## 2013-10-21 DIAGNOSIS — I10 Essential (primary) hypertension: Secondary | ICD-10-CM | POA: Diagnosis not present

## 2013-10-21 DIAGNOSIS — R269 Unspecified abnormalities of gait and mobility: Secondary | ICD-10-CM | POA: Diagnosis not present

## 2013-10-21 DIAGNOSIS — IMO0001 Reserved for inherently not codable concepts without codable children: Secondary | ICD-10-CM | POA: Diagnosis not present

## 2013-10-21 DIAGNOSIS — G20 Parkinson's disease: Secondary | ICD-10-CM | POA: Diagnosis not present

## 2013-10-21 DIAGNOSIS — M6281 Muscle weakness (generalized): Secondary | ICD-10-CM | POA: Diagnosis not present

## 2013-10-21 DIAGNOSIS — E119 Type 2 diabetes mellitus without complications: Secondary | ICD-10-CM | POA: Diagnosis not present

## 2013-10-26 ENCOUNTER — Ambulatory Visit: Payer: Medicare Other | Admitting: Neurology

## 2013-10-26 DIAGNOSIS — M6281 Muscle weakness (generalized): Secondary | ICD-10-CM | POA: Diagnosis not present

## 2013-10-26 DIAGNOSIS — I10 Essential (primary) hypertension: Secondary | ICD-10-CM | POA: Diagnosis not present

## 2013-10-26 DIAGNOSIS — R269 Unspecified abnormalities of gait and mobility: Secondary | ICD-10-CM | POA: Diagnosis not present

## 2013-10-26 DIAGNOSIS — E119 Type 2 diabetes mellitus without complications: Secondary | ICD-10-CM | POA: Diagnosis not present

## 2013-10-26 DIAGNOSIS — G20 Parkinson's disease: Secondary | ICD-10-CM | POA: Diagnosis not present

## 2013-10-26 DIAGNOSIS — IMO0001 Reserved for inherently not codable concepts without codable children: Secondary | ICD-10-CM | POA: Diagnosis not present

## 2014-01-06 ENCOUNTER — Other Ambulatory Visit: Payer: Self-pay | Admitting: *Deleted

## 2014-01-06 DIAGNOSIS — I10 Essential (primary) hypertension: Secondary | ICD-10-CM

## 2014-01-06 DIAGNOSIS — E039 Hypothyroidism, unspecified: Secondary | ICD-10-CM

## 2014-01-06 DIAGNOSIS — E785 Hyperlipidemia, unspecified: Secondary | ICD-10-CM

## 2014-01-06 DIAGNOSIS — E119 Type 2 diabetes mellitus without complications: Secondary | ICD-10-CM

## 2014-01-10 ENCOUNTER — Other Ambulatory Visit: Payer: Medicare Other

## 2014-01-10 DIAGNOSIS — E119 Type 2 diabetes mellitus without complications: Secondary | ICD-10-CM

## 2014-01-10 DIAGNOSIS — E785 Hyperlipidemia, unspecified: Secondary | ICD-10-CM | POA: Diagnosis not present

## 2014-01-10 DIAGNOSIS — E039 Hypothyroidism, unspecified: Secondary | ICD-10-CM | POA: Diagnosis not present

## 2014-01-10 DIAGNOSIS — I10 Essential (primary) hypertension: Secondary | ICD-10-CM | POA: Diagnosis not present

## 2014-01-11 LAB — BASIC METABOLIC PANEL
BUN/Creatinine Ratio: 17 (ref 11–26)
BUN: 12 mg/dL (ref 8–27)
CO2: 23 mmol/L (ref 18–29)
Calcium: 9.4 mg/dL (ref 8.7–10.3)
Chloride: 100 mmol/L (ref 97–108)
Creatinine, Ser: 0.71 mg/dL (ref 0.57–1.00)
GFR calc Af Amer: 95 mL/min/{1.73_m2} (ref >59)
GFR calc non Af Amer: 82 mL/min/{1.73_m2} (ref >59)
Glucose: 147 mg/dL — ABNORMAL HIGH (ref 65–99)
Potassium: 4.6 mmol/L (ref 3.5–5.2)
Sodium: 138 mmol/L (ref 134–144)

## 2014-01-11 LAB — CBC WITH DIFFERENTIAL/PLATELET
Basophils Absolute: 0 10*3/uL (ref 0.0–0.2)
Basos: 0 %
Eos: 3 %
Eosinophils Absolute: 0.2 10*3/uL (ref 0.0–0.4)
HCT: 41.3 % (ref 34.0–46.6)
Hemoglobin: 14.1 g/dL (ref 11.1–15.9)
Immature Grans (Abs): 0 10*3/uL (ref 0.0–0.1)
Immature Granulocytes: 0 %
Lymphocytes Absolute: 1.7 10*3/uL (ref 0.7–3.1)
Lymphs: 29 %
MCH: 30.2 pg (ref 26.6–33.0)
MCHC: 34.1 g/dL (ref 31.5–35.7)
MCV: 88 fL (ref 79–97)
Monocytes Absolute: 0.6 10*3/uL (ref 0.1–0.9)
Monocytes: 10 %
Neutrophils Absolute: 3.4 10*3/uL (ref 1.4–7.0)
Neutrophils Relative %: 58 %
RBC: 4.67 x10E6/uL (ref 3.77–5.28)
RDW: 14.7 % (ref 12.3–15.4)
WBC: 5.8 10*3/uL (ref 3.4–10.8)

## 2014-01-11 LAB — LIPID PANEL
Chol/HDL Ratio: 4 ratio units (ref 0.0–4.4)
Cholesterol, Total: 246 mg/dL — ABNORMAL HIGH (ref 100–199)
HDL: 62 mg/dL (ref >39)
LDL Calculated: 157 mg/dL — ABNORMAL HIGH (ref 0–99)
Triglycerides: 134 mg/dL (ref 0–149)
VLDL Cholesterol Cal: 27 mg/dL (ref 5–40)

## 2014-01-12 DIAGNOSIS — B351 Tinea unguium: Secondary | ICD-10-CM | POA: Diagnosis not present

## 2014-01-12 DIAGNOSIS — M79609 Pain in unspecified limb: Secondary | ICD-10-CM | POA: Diagnosis not present

## 2014-01-12 DIAGNOSIS — E119 Type 2 diabetes mellitus without complications: Secondary | ICD-10-CM | POA: Diagnosis not present

## 2014-01-13 ENCOUNTER — Ambulatory Visit: Payer: Medicare Other | Admitting: Neurology

## 2014-01-14 ENCOUNTER — Telehealth: Payer: Self-pay | Admitting: *Deleted

## 2014-01-17 ENCOUNTER — Ambulatory Visit (INDEPENDENT_AMBULATORY_CARE_PROVIDER_SITE_OTHER): Payer: Medicare Other | Admitting: Internal Medicine

## 2014-01-17 ENCOUNTER — Encounter: Payer: Self-pay | Admitting: Internal Medicine

## 2014-01-17 VITALS — BP 136/82 | HR 69 | Resp 10 | Wt 172.2 lb

## 2014-01-17 DIAGNOSIS — M79661 Pain in right lower leg: Secondary | ICD-10-CM | POA: Insufficient documentation

## 2014-01-17 DIAGNOSIS — G20 Parkinson's disease: Secondary | ICD-10-CM

## 2014-01-17 DIAGNOSIS — I10 Essential (primary) hypertension: Secondary | ICD-10-CM | POA: Diagnosis not present

## 2014-01-17 DIAGNOSIS — E119 Type 2 diabetes mellitus without complications: Secondary | ICD-10-CM | POA: Diagnosis not present

## 2014-01-17 DIAGNOSIS — G20A1 Parkinson's disease without dyskinesia, without mention of fluctuations: Secondary | ICD-10-CM | POA: Diagnosis not present

## 2014-01-17 DIAGNOSIS — E785 Hyperlipidemia, unspecified: Secondary | ICD-10-CM | POA: Insufficient documentation

## 2014-01-17 DIAGNOSIS — M79609 Pain in unspecified limb: Secondary | ICD-10-CM

## 2014-01-17 DIAGNOSIS — E039 Hypothyroidism, unspecified: Secondary | ICD-10-CM

## 2014-01-17 DIAGNOSIS — Z1231 Encounter for screening mammogram for malignant neoplasm of breast: Secondary | ICD-10-CM

## 2014-01-17 MED ORDER — PRAVASTATIN SODIUM 20 MG PO TABS: 20.0000 mg | ORAL_TABLET | Freq: Every day | ORAL | Status: DC

## 2014-01-17 NOTE — Progress Notes (Signed)
Patient ID: Kara Moore, female   DOB: Jul 26, 1935, 79 y.o.   MRN: [id number]   Location:  [facility] [place] [facility]  Code Status: DNR   No Known Allergies  Chief Complaint  Patient presents with  . Medical Managment of Chronic Issues    3 month follow-up, discuss labs completed 01/10/2014  . Leg Pain    Ongoing right leg pain     HPI: Patient is a 79 y.o. white female seen in the office today for medical mgt of chronic diseases and ongoing right leg pain.  Right knee and leg are much more painful.  Sometimes up behind knee, sometimes into calf.  Has had shot in her knee 2x by ortho.  Was getting therapy at [facility]. Using walker at night only.  Had toenails trimmed at podiatry last week.    Is due for her mammogram.    Neurology appt next is in July.  Tremors have gotten worse.  Is on cancellation list if a slot comes open.  Is worse at night.  Sometimes slurring speech.  No difficulty swallowing.  No falls.  Memory stable.  Sugars are good 134-147 fasting.  One after breakfast was 202.    Review of Systems:  Review of Systems  Constitutional: Positive for malaise/fatigue. Negative for fever and chills.  HENT: Negative for congestion.   Eyes: Negative for blurred vision.  Respiratory: Negative for shortness of breath.   Cardiovascular: Negative for chest pain and leg swelling.  Gastrointestinal: Negative for abdominal pain, constipation, blood in stool and melena.  Genitourinary: Negative for dysuria.  Musculoskeletal: Positive for joint pain and myalgias. Negative for falls.  Skin: Negative for rash.  Neurological: Positive for tremors. Negative for dizziness and loss of consciousness.  Endo/Heme/Allergies: Bruises/bleeds easily.  Psychiatric/Behavioral: Positive for depression and memory loss.    Past Medical History  Diagnosis Date  . Benign essential hypertension   . Diabetes mellitus without complication   . Hyperlipidemia LDL goal <  100   . Hypothyroidism   . Memory loss   . Confusion   . Senile osteoporosis   . Macular degeneration     Past Surgical History  Procedure Laterality Date  . Adenoidectomy  2011    Social History:   reports that she has never smoked. She does not have any smokeless tobacco history on file. She reports that she does not drink alcohol or use illicit drugs.  Family History  Problem Relation Age of Onset  . Tremor Maternal Grandmother   . Diabetes Paternal Grandfather   . Heart attack Father   . Cancer Sister   . Heart attack Brother   . Diabetes Brother   . Tremor Maternal Aunt   . Diabetes Paternal Grandmother     Medications: Patient's Medications  New Prescriptions   PRAVASTATIN (PRAVACHOL) 20 MG TABLET    Take 1 tablet (20 mg total) by mouth daily.  Previous Medications   ACETAMINOPHEN (TYLENOL) 500 MG TABLET    Take 500 mg by mouth. (STANDING ORDER) take 1 tablet evert 4 hours as need for fever up to 101.  headach and or minor discomfort.  ( don not exceed 2000 mg)  **MAX DOSE IS 3,000/24 HRS FROM ALL SOURCES88   ALUMINUM-MAGNESIUM HYDROXIDE 200-200 MG/5ML SUSPENSION    Take 5 mLs by mouth. (standing order) 30 cc (2 TBSO) orally up to 4 times a day as needed for heartburn or indigestion ** not to exceed 4 doses in 24  hours**   ASPIRIN 81 MG CHEWABLE TABLET    Chew 81 mg by mouth daily. 1 tablet every other day   CALCIUM-VITAMIN D (OSCAL WITH D) 500-200 MG-UNIT PER TABLET    Take 1 tablet by mouth daily.   CARBIDOPA-LEVODOPA (SINEMET IR) 25-100 MG PER TABLET    Take 0.5 tablets by mouth 3 (three) times daily. Follow titration instructions provided.   CHOLECALCIFEROL (VITAMIN D) 2000 UNITS CAPS    Take 1 capsule (2,000 Units total) by mouth daily.   FLUTICASONE (FLONASE) 50 MCG/ACT NASAL SPRAY    Place 2 sprays into the nose as needed.    FUROSEMIDE (LASIX) 20 MG TABLET    Take 1/2 tablet orally every day   GLIPIZIDE (GLUCOTROL) 5 MG TABLET    Take 1 tablet (5 mg total) by  mouth daily before breakfast.   GUAIFENESIN (ROBITUSSIN) 100 MG/5ML LIQUID    Take 100 mg by mouth. (Standing Order) 2 tsp ( 10cc) orally evry six hours as need for co;ugh**NOT TO EXCEED 4 DOSES   LEVOTHYROXINE (SYNTHROID, LEVOTHROID) 25 MCG TABLET    Take 25 mcg by mouth daily before breakfast.   LOPERAMIDE (IMODIUM) 2 MG CAPSULE    Take 2 mg by mouth. Take one tablet orally with each loose stool as need for diarrhea * NTE 8 doses in 24 hours*   MAGNESIUM HYDROXIDE (MILK OF MAGNESIA) 400 MG/5ML SUSPENSION    Take by mouth daily as needed for constipation.   MIRTAZAPINE (REMERON) 15 MG TABLET    Take 15 mg by mouth at bedtime.   MULTIPLE VITAMINS-MINERALS (ICAPS MV) TABS    Take 2 tablets by mouth. Take 2 capsules daily   POTASSIUM CHLORIDE (K-DUR,KLOR-CON) 10 MEQ TABLET    Take 10 mEq by mouth daily.    RANITIDINE (ZANTAC) 150 MG CAPSULE    Take 150 mg by mouth as needed.    TRAZODONE (DESYREL) 150 MG TABLET    Take 50 mg by mouth at bedtime as needed for sleep.  Modified Medications   No medications on file  Discontinued Medications   CALCIUM CARBONATE-VITAMIN D (CALCIUM-VITAMIN D) 500-200 MG-UNIT PER TABLET    Take 1 tablet by mouth daily.   HYDROCORTISONE (ANUSOL-HC) 2.5 % RECTAL CREAM    Place rectally 2 (two) times daily. PRN     Physical Exam: Filed Vitals:   01/17/14 1008  BP: 136/82  Pulse: 69  Resp: 10  Weight: 172 lb 3.2 oz (78.109 kg)  SpO2: 98%  Physical Exam  Constitutional: She appears well-developed and well-nourished. No distress.  HENT:  Head: Normocephalic and atraumatic.  Cardiovascular: Normal rate, regular rhythm, normal heart sounds and intact distal pulses.   Pulmonary/Chest: Effort normal and breath sounds normal. No respiratory distress.  Abdominal: Soft. Bowel sounds are normal. She exhibits no distension and no mass. There is no tenderness.  Musculoskeletal: She exhibits edema and tenderness.  Kyphosis;  Will not walk with walker despite recommendations;  has tenderness in popliteal fossa and calf area of right leg  Neurological: She is alert. She exhibits abnormal muscle tone. Coordination abnormal.  Oriented to person, place, not precise time  Skin: Skin is warm and dry.  Psychiatric:  Flat affect    Labs reviewed: Basic Metabolic Panel:  Recent Labs  16/08/9609/13/14 1100 01/10/14 0908  NA 139 138  K 4.8 4.6  CL 100 100  CO2 24 23  GLUCOSE 122* 147*  BUN 14 12  CREATININE 0.65 0.71  CALCIUM 9.8 9.4  TSH  4.990*  --    Liver Function Tests:  Recent Labs  08/30/13 1100  AST 11  ALT 7  ALKPHOS 134*  BILITOT 0.3  PROT 6.3  CBC:  Recent Labs  08/30/13 1100 01/10/14 0908  WBC 6.1 5.8  NEUTROABS 3.8 3.4  HGB 13.8 14.1  HCT 42.1 41.3  MCV 92 88   Lipid Panel:  Recent Labs  01/10/14 0908  HDL 62  LDLCALC 157*  TRIG 134  CHOLHDL 4.0   Lab Results  Component Value Date   HGBA1C 6.3* 08/30/2013   Lab Results  Component Value Date   TSH 4.990* 08/30/2013   Assessment/Plan Benign essential hypertension bp at goal.  No changes needed.  Type II or unspecified type diabetes mellitus without mention of complication, not stated as uncontrolled hba1c 6.3 in October and cbgs remain excellent w/o hypoglycemia.  No changes in diabetes regimen.  Will add statin therapy due to CM being CAD equivalent.    Unspecified hypothyroidism Last tsh was slightly elevated in 10/14.  Will continue current synthroid and recheck tsh next time.  Parkinson's disease (tremor, stiffness, slow motion, unstable posture) Slight worsening in tremor of right hand.  Has upcoming neurology visit for further evaluation.  Hyperlipidemia LDL goal < 100 Will add pravachol due to high LDL and no plans for change in diet at her facility and she is diabetic.  Right calf pain I am suspicious that she may have a Baker's cyst not just right knee osteoarthritis.  [place] ordered to evaluate.  If this is unremarkable, advised to call back for an appointment  for a steroid knee injection.    Labs/tests ordered: Orders Placed This Encounter  Procedures  . MM DIGITAL SCREENING BILATERAL    Standing Status: Future     Number of Occurrences:      Standing Expiration Date: 03/20/2015    Order Specific Question:  Reason for Exam (SYMPTOM  OR DIAGNOSIS REQUIRED)    Answer:  routine screen    Order Specific Question:  Preferred imaging location?    Answer:  External     Comments:  [place] [facility]  . US Venous Img Lower Unilateral Right    Standing Status: Future     Number of Occurrences:      Standing Expiration Date: 03/20/2015    Order Specific Question:  Reason for Exam (SYMPTOM  OR DIAGNOSIS REQUIRED)    Answer:  ? baker's cyst--has pain behind right knee and into calf at times    Order Specific Question:  Preferred imaging location?    Answer:  External     Comments:  [facility] on [address]  . CBC with Differential    Standing Status: Future     Number of Occurrences:      Standing Expiration Date: 01/18/2015  . Comprehensive metabolic panel    Standing Status: Future     Number of Occurrences:      Standing Expiration Date: 01/18/2015  . Hemoglobin A1c    Standing Status: Future     Number of Occurrences:      Standing Expiration Date: 01/18/2015  . Lipid panel    Standing Status: Future     Number of Occurrences:      Standing Expiration Date: 01/18/2015  . TSH    Standing Status: Future     Number of Occurrences:      Standing Expiration Date: 01/18/2015   Next appt:  6 mos physical

## 2014-01-17 NOTE — Assessment & Plan Note (Signed)
I am suspicious that she may have a Baker's cyst not just right knee osteoarthritis.  US ordered to evaluate.  If this is unremarkable, advised to call back for an appointment for a steroid knee injection.

## 2014-01-17 NOTE — Assessment & Plan Note (Signed)
Last tsh was slightly elevated in 10/14.  Will continue current synthroid and recheck tsh next time.

## 2014-01-17 NOTE — Assessment & Plan Note (Signed)
Slight worsening in tremor of right hand.  Has upcoming neurology visit for further evaluation.

## 2014-01-17 NOTE — Assessment & Plan Note (Signed)
Will add pravachol due to high LDL and no plans for change in diet at her facility and she is diabetic.

## 2014-01-17 NOTE — Telephone Encounter (Signed)
I called and spoke to HoopleWanda, made f/u appt (rescheduled from weather) to tomorrow at 0930.  (increase in tremors).

## 2014-01-17 NOTE — Assessment & Plan Note (Signed)
hba1c 6.3 in October and cbgs remain excellent w/o hypoglycemia.  No changes in diabetes regimen.  Will add statin therapy due to CM being CAD equivalent.

## 2014-01-17 NOTE — Assessment & Plan Note (Signed)
bp at goal.  No changes needed.

## 2014-01-18 ENCOUNTER — Encounter: Payer: Self-pay | Admitting: Neurology

## 2014-01-18 ENCOUNTER — Ambulatory Visit (INDEPENDENT_AMBULATORY_CARE_PROVIDER_SITE_OTHER): Payer: Medicare Other | Admitting: Neurology

## 2014-01-18 VITALS — BP 138/76 | HR 64 | Ht 63.5 in | Wt 171.0 lb

## 2014-01-18 DIAGNOSIS — M25569 Pain in unspecified knee: Secondary | ICD-10-CM

## 2014-01-18 DIAGNOSIS — R269 Unspecified abnormalities of gait and mobility: Secondary | ICD-10-CM | POA: Diagnosis not present

## 2014-01-18 DIAGNOSIS — E559 Vitamin D deficiency, unspecified: Secondary | ICD-10-CM

## 2014-01-18 DIAGNOSIS — G20 Parkinson's disease: Secondary | ICD-10-CM

## 2014-01-18 DIAGNOSIS — Z9181 History of falling: Secondary | ICD-10-CM | POA: Diagnosis not present

## 2014-01-18 DIAGNOSIS — R296 Repeated falls: Secondary | ICD-10-CM

## 2014-01-18 DIAGNOSIS — M25561 Pain in right knee: Secondary | ICD-10-CM

## 2014-01-18 DIAGNOSIS — Z8781 Personal history of (healed) traumatic fracture: Secondary | ICD-10-CM

## 2014-01-18 DIAGNOSIS — M810 Age-related osteoporosis without current pathological fracture: Secondary | ICD-10-CM

## 2014-01-18 NOTE — Progress Notes (Signed)
Subjective:    Patient ID: Kara Moore is a 79 y.o. female.  HPI    Interim history:   Kara Moore is a very pleasant 79-year-old right-handed woman with an underlying medical history of diabetes, hyperlipidemia, hypothyroidism, vitamin D deficiency, reflux disease, and hypertension and osteoporosis who presents for followup consultation of her gait disorder and parkinsonism, probable L sided predominant PD, complicated by recurrent falls. She is accompanied by her niece, Kara Moore, again today. I last saw her on 09/09/2013, at which time I felt that her gait disorder with recurrent falls was out of proportion to her parkinsonism. I felt that her gait disorder was a function of advancing age, previous pelvic fracture, and arthritis. She had finished inpatient rehabilitation at the time. We talked about her recent additional testing including head CT, EEG, carotid Doppler study, cardiac monitor, all of which were unrevealing. She was advised to continue using her 2 wheeled walker at all times.  Today, she reports feeling stable for the most part. She saw Dr. Reed yesterday, and Pravachol was added yesterday, but not yet started. She has not had any recent falls. Dr. Reed wants her to use her walker. She has worse R knee pain. She is supposed to have a knee ultrasound for concern for Baker's cyst. She was seen by PT at [facility] and cleared to walk without a walker, but I did not agree with that. She no longer is on Trazodone and takes Remeron for sleep. She continues to be on C/L 1/2 tid. She needs to see her eye doctor for routine FU. She does not want to use a walker.  I saw her on 06/17/2013 after a recent fall and suggested further workup for a recent episode of confusion and her recurrent falls. I ordered CTH, EEG, C. Doppler, cardiac monitor and suggested a referral to geriatrics. She has since then started seeing Dr. Reed.  Her head CT from 06/22/2013 was negative for any acute findings.   Her EEG from 06/25/2013 was normal in the awake and sleep states.  Her carotid Doppler study from 06/25/2013 was negative for any significant stenoses.  Her cardiac event monitor from 07/01/2013 through 07/30/2013 was consistent with normal sinus rhythm with occasional PVCs, no evidence of atrial fibrillation. This was interpreted by Dr. Jeffrey Katz.  She had an Echocardiogram on 04/15/13 at the [facility] in [place]. She has arthritis, in her R knee and had 2 injections before.  She lives at [facility] in [facility]isted [facility]. She feels like "I have legs again". She is tolerating the C/L 25/100 mg 1/2 tid.  I first met her on 03/26/2013 at which time her niece and the patient reported a approximately two-month history of decrease in fine motor skills, decrease in mobility, gait changes and recurrent falls. She reported becoming slower than usual taking longer to dress herself, needing assistance with some of her ADLs. She was started on C/L at the time. She has had urinary urgency. She recently had physical therapy and her therapist noted lack of ability, inability to dress herself and drooling. The patient had to give up driving.  She had a MRI brain on 04/08/13: Abnormal MRI scan of the brain showing mild changes of age-appropriate chronic microvascular ischemia and generalized cerebral atrophy.  On 02/11/13 she fell out of her bed and had difficulty getting back up. Her nephew was visiting from [place], but he could not hear her, as he is hard of hearing and she was calling in a   soft voice. She developed rhabdomyolysis. In the latter part of April she was taken to UC in [place], then to the ER for suspected cellulitis. She had an US of the LEs and did not have a blood clot. She was given lasix for swelling. The next day she fell, and then again, and then again the next day. After these falls, she started having care 24/7 through [facility]. She has been in PT since the end of April. She has been  using a 2 wheeled walker since then.  She has a strong FHx of tremors on her mother's side (including M, MGM and cousins). Both of her siblings passed away.  She never had a TIA or a stroke.  She fell at home on 04/30/13, while coming out of the bathroom. She has been having issues with constipation and had taken Dulcolax suppositories, but also took Mag citrate and her niece found her on the floor and she had soiled herself and for some reason had not pushed her Lifeline button. She denied hitting her head or LOC. Her niece called 911 and she was taken to [facility] to the ER and was discharged to home from there. Then she was admitted to inpt rehab at [facility] on 05/02/13. She fell while in rehab and was found to have a pelvic Fx b/l, reportedly old. She has had some confusion intermittently.   Her Past Medical History Is Significant For: Past Medical History  Diagnosis Date  . Benign essential hypertension   . Diabetes mellitus without complication   . Hyperlipidemia LDL goal < 100   . Hypothyroidism   . Memory loss   . Confusion   . Senile osteoporosis   . Macular degeneration     Her Past Surgical History Is Significant For: Past Surgical History  Procedure Laterality Date  . Adenoidectomy  2011    Her Family History Is Significant For: Family History  Problem Relation Age of Onset  . Tremor Maternal Grandmother   . Diabetes Paternal Grandfather   . Heart attack Father   . Cancer Sister   . Heart attack Brother   . Diabetes Brother   . Tremor Maternal Aunt   . Diabetes Paternal Grandmother     Her Social History Is Significant For: History   Social History  . Marital Status: Unknown    Spouse Name: N/A    Number of Children: N/A  . Years of Education: N/A   Social History Main Topics  . Smoking status: Never Smoker   . Smokeless tobacco: None  . Alcohol Use: No  . Drug Use: No  . Sexual Activity: None   Other Topics Concern  . None    Social History Narrative  . None    Her Allergies Are:  No Known Allergies:   Her Current Medications Are:  Outpatient Encounter Prescriptions as of 01/18/2014  Medication Sig  . acetaminophen (TYLENOL) 500 MG tablet Take 500 mg by mouth. (STANDING ORDER) take 1 tablet evert 4 hours as need for fever up to 101.  headach and or minor discomfort.  ( don not exceed 2000 mg)  **MAX DOSE IS 3,000/24 HRS FROM ALL SOURCES88  . aluminum-magnesium hydroxide 200-200 MG/5ML suspension Take 5 mLs by mouth. (standing order) 30 cc (2 TBSO) orally up to 4 times a day as needed for heartburn or indigestion ** not to exceed 4 doses in 24 hours**  . aspirin 81 MG chewable tablet Chew 81 mg by mouth daily.   1 tablet every other day  . calcium-vitamin D (OSCAL WITH D) 500-200 MG-UNIT per tablet Take 1 tablet by mouth daily.  . carbidopa-levodopa (SINEMET IR) 25-100 MG per tablet Take 0.5 tablets by mouth 3 (three) times daily. Follow titration instructions provided.  . Cholecalciferol (VITAMIN D) 2000 UNITS CAPS Take 1 capsule (2,000 Units total) by mouth daily.  . fluticasone (FLONASE) 50 MCG/ACT nasal spray Place 2 sprays into the nose as needed.   . furosemide (LASIX) 20 MG tablet Take 1/2 tablet orally every day  . glipiZIDE (GLUCOTROL) 5 MG tablet Take 1 tablet (5 mg total) by mouth daily before breakfast.  . guaiFENesin (ROBITUSSIN) 100 MG/5ML liquid Take 100 mg by mouth. (Standing Order) 2 tsp ( 10cc) orally evry six hours as need for co;ugh**NOT TO EXCEED 4 DOSES  . levothyroxine (SYNTHROID, LEVOTHROID) 25 MCG tablet Take 25 mcg by mouth daily before breakfast.  . loperamide (IMODIUM) 2 MG capsule Take 2 mg by mouth. Take one tablet orally with each loose stool as need for diarrhea * NTE 8 doses in 24 hours*  . magnesium hydroxide (MILK OF MAGNESIA) 400 MG/5ML suspension Take by mouth daily as needed for constipation.  . mirtazapine (REMERON) 15 MG tablet Take 15 mg by mouth at bedtime.  . Multiple  Vitamins-Minerals (ICAPS MV) TABS Take 2 tablets by mouth. Take 2 capsules daily  . potassium chloride (K-DUR,KLOR-CON) 10 MEQ tablet Take 10 mEq by mouth daily.   . pravastatin (PRAVACHOL) 20 MG tablet Take 1 tablet (20 mg total) by mouth daily.  . ranitidine (ZANTAC) 150 MG capsule Take 150 mg by mouth as needed.   . traZODone (DESYREL) 150 MG tablet Take 50 mg by mouth at bedtime as needed for sleep.  :  Review of Systems:  Out of a complete 14 point review of systems, all are reviewed and negative with the exception of these symptoms as listed below:   Review of Systems  Constitutional: Negative.   HENT: Positive for drooling.   Eyes: Negative.   Respiratory: Negative.   Cardiovascular: Negative.   Gastrointestinal: Negative.   Endocrine: Negative.   Genitourinary: Negative.   Musculoskeletal: Positive for arthralgias.  Skin: Negative.   Allergic/Immunologic: Negative.   Neurological: Negative.   Hematological: Negative.   Psychiatric/Behavioral: Negative.     Objective:  Neurologic Exam  Physical Exam Physical Examination:   Filed Vitals:   01/18/14 0955  BP: 138/76  Pulse:     General Examination: The patient is a very pleasant 79 y.o. female in no acute distress. She is calm and cooperative with the exam. She denies Auditory Hallucinations and Visual Hallucinations.   HEENT: Normocephalic, atraumatic, pupils are equal, round and reactive to light and accommodation. Extraocular tracking shows moderate saccadic breakdown without nystagmus noted. Hearing is intact. Face is symmetric with moderate facial masking and normal facial sensation. Excess seborrhoic changes are seen on her face. There is no lip, neck or jaw tremor. Neck is moderately rigid with intact passive ROM. There are no carotid bruits on auscultation. Oropharynx exam reveals mild mouth dryness. No significant airway crowding is noted. Mallampati is class II. Tongue protrudes centrally and palate elevates  symmetrically. Mild drooling is noted.  Chest: is clear to auscultation without wheezing, rhonchi or crackles noted.  Heart: sounds are regular and normal with a 1/6 early systolic murmur, no rubs or gallops noted.   Abdomen: is soft, non-tender and non-distended with normal bowel sounds appreciated on auscultation.  Extremities: There is no edema  in the distal lower extremities bilaterally. She is wearing compression stockings up to the knees.   Skin: is warm and dry with no trophic changes noted.   Musculoskeletal: exam reveals no obvious joint deformities, tenderness or joint swelling or erythema, with the exception of R knee swelling.  Neurologically:  Mental status: The patient is awake and alert, paying good  attention. She is able to partially provide the history. Her nieces provides details. She is oriented to: person, place, time/date, situation, day of week, month of year and year. Her memory, attention, language and knowledge are impaired, mildly. There is no aphasia, agnosia, apraxia or anomia. There is a mild degree of bradyphrenia. Speech is mildly hypophonic with no dysarthria noted. Mood is congruent and affect is normal.   Her MMSE score was 29/30. CDT 4/4. AFT (Animal Fluency Test) score was 14 - in 5/14.  Cranial nerves are as described above under HEENT exam. In addition, shoulder shrug is normal with equal shoulder height noted.  Motor exam: Normal bulk, and strength for age is noted. Tone is mildly rigid with presence of cogwheeling in the right upper extremity. There is overall moderate bradykinesia. There is no drift or rebound. There is no resting tremor in the RUE, but she has a subtle UE postural tremor b/l and a slight intermittent rest tremor in the LUE.   Romberg is negative. Reflexes are 1+ in the upper extremities and 1+ in the lower extremities.Fine motor skills: Finger taps, hand movements, and rapid alternating patting are moderately impaired on the L and mild  to moderately impaired on the R. Foot taps and foot agility are moderately impaired bilaterally, L worse than R.    Cerebellar testing shows no dysmetria or intention tremor on finger to nose testing. There is no truncal or gait ataxia.   Sensory exam is intact to light touch.   Gait, station and balance: she stands with mild difficulty and pushes herself up. She has a moderately stooped posture. She walks slowly and insecurely and has some instability when turning. She did not bring her 2 wheeled walker today. Her balance is mild to moderately impaired.   Assessment and Plan:   In summary, Jourdan E Ortego is a very pleasant 78 y.o.-year old female with a history of slowness, stiffness, recurrent falls, problems with fine motor skills and posture as well as drooling. Her history and physical exam are most consistent with left-sided predominant Parkinson's disease vs parkinsonism, and she has a significant gait disorder with recurrent falls with injury including pelvic fracture in the past. Her gait disorder is likely a function of multiple additional issues including advancing age, previous pelvic fracture, arthritis, specially her knee arthritis. She currently stays in an assisted living and has apparently been cleared by physical therapy to walk without a walker. I do not agree with this. I think she is at risk for falling because of prior fall and her gait disorder, abnormal posture, and her parkinsonism. In addition she is at risk for injury because of advanced age, history of osteoporosis and vitamin D deficiency, diabetes, advanced age and prior injuries. I would like for her to start using a rolling walker with seat and I prescribed him for her. She made it very clear that she would not use one unless she absolutely feels that she needs one. Nevertheless, explained to her and her niece that I can only make recommendations and help her with implementing those recommendations. She is advised to  continue with the   Sinemet at low dose. I would not really try to push the dose too much because of side effects. She has remained fairly stable with her parkinsonian signs. She had recent testing including CTH, EEG, C. Doppler, cardiac monitor, all of which were unrevealing and she has established care with Dr. Mariea Clonts in geriatrics. I would like to see her back in about 4 months from now, sooner if the need arises. Most of my 30 minute visit today was spent in counseling and coordination of care, reviewing test results and reviewing medication.

## 2014-01-18 NOTE — Patient Instructions (Addendum)
We will continue to use sinemet at the same dose.  You are at risk for falls and I want you to use a rolling walker at all times. Your risk for injury with a fall is high: you have osteoporosis, arthritis, right knee pain, diabetes and a history of pelvic fracture and have fallen before!

## 2014-01-20 ENCOUNTER — Ambulatory Visit: Payer: Self-pay | Admitting: Internal Medicine

## 2014-01-20 DIAGNOSIS — M25569 Pain in unspecified knee: Secondary | ICD-10-CM | POA: Diagnosis not present

## 2014-01-20 DIAGNOSIS — M79609 Pain in unspecified limb: Secondary | ICD-10-CM | POA: Diagnosis not present

## 2014-01-21 ENCOUNTER — Telehealth: Payer: Self-pay

## 2014-01-21 NOTE — Telephone Encounter (Signed)
Spoke with Kara SineNancy patient's niece. Kara Moore verbalized understanding of results for U/S of right lower extremity limited(completed on 01/20/14 @ [facility]): No baker's cyst or any abnormal finding behind knee.   **Side note** report sent to scanning   Kara Moore now questions what is the next step/ therapy plan for pain in right popliteal fossa? Dr.Reed please advise

## 2014-01-23 NOTE — Telephone Encounter (Signed)
At the visit we discussed scheduling an appt with me for a steroid injection of her knee which has previously been effective.  Please arrange this for her.

## 2014-01-24 NOTE — Telephone Encounter (Signed)
Left message on voicemail for patient to return call when available   

## 2014-01-25 NOTE — Telephone Encounter (Signed)
Spoke with Harriett SineNancy, scheduled appointment with Dr.Reed on Monday 01/31/2014 for knee injection

## 2014-01-27 ENCOUNTER — Ambulatory Visit: Payer: Self-pay | Admitting: Internal Medicine

## 2014-01-27 DIAGNOSIS — Z1231 Encounter for screening mammogram for malignant neoplasm of breast: Secondary | ICD-10-CM | POA: Diagnosis not present

## 2014-01-27 NOTE — Telephone Encounter (Signed)
Pt came in for her visit, closing encounter °

## 2014-01-31 ENCOUNTER — Encounter: Payer: Self-pay | Admitting: Internal Medicine

## 2014-01-31 ENCOUNTER — Ambulatory Visit (INDEPENDENT_AMBULATORY_CARE_PROVIDER_SITE_OTHER): Payer: Medicare Other | Admitting: Internal Medicine

## 2014-01-31 VITALS — BP 130/82 | HR 62 | Temp 98.7°F | Wt 171.0 lb

## 2014-01-31 DIAGNOSIS — M1710 Unilateral primary osteoarthritis, unspecified knee: Secondary | ICD-10-CM | POA: Diagnosis not present

## 2014-01-31 DIAGNOSIS — IMO0002 Reserved for concepts with insufficient information to code with codable children: Secondary | ICD-10-CM | POA: Diagnosis not present

## 2014-01-31 DIAGNOSIS — M1711 Unilateral primary osteoarthritis, right knee: Secondary | ICD-10-CM | POA: Insufficient documentation

## 2014-01-31 MED ORDER — TRIAMCINOLONE ACETONIDE 40 MG/ML IJ SUSP
40.0000 mg | Freq: Once | INTRAMUSCULAR | Status: AC
  Administered 2014-01-31: 40 mg via INTRA_ARTICULAR

## 2014-01-31 NOTE — Progress Notes (Signed)
Patient ID: Kara Moore, female   DOB: 03-02-1935, 79 y.o.   MRN: [id number]   Location:  [facility]  No Known Allergies  Chief Complaint  Patient presents with  . Knee Injection    Right knee injection, patient with right knee pain. Leg pain is from the thigh down, worst from the knee down     HPI: Patient is a 79 y.o. white female with h/o Parkinson's disease and right knee osteoarthritis was seen in the office today for a steroid injection of her right knee.  She has not responded to more conservative oral therapies.  She cannot take long term nsaids due to risk for renal dysfunction and gi upset and narcotics cause sedation and confusion.  She is doing ok other than her knee pain today.  She continues to refuse to use her walker even though it takes her several tries to get up out of a chair successfully, she is unsteady.      Review of Systems:  Review of Systems  Constitutional: Negative for fever and chills.  Musculoskeletal: Positive for joint pain. Negative for falls.       Right knee  Neurological: Positive for tremors.  Psychiatric/Behavioral: Positive for depression and memory loss.     Past Medical History  Diagnosis Date  . Benign essential hypertension   . Diabetes mellitus without complication   . Hyperlipidemia LDL goal < 100   . Hypothyroidism   . Memory loss   . Confusion   . Senile osteoporosis   . Macular degeneration     Past Surgical History  Procedure Laterality Date  . Adenoidectomy  2011    Social History:   reports that she has never smoked. She does not have any smokeless tobacco history on file. She reports that she does not drink alcohol or use illicit drugs.  Family History  Problem Relation Age of Onset  . Tremor Maternal Grandmother   . Diabetes Paternal Grandfather   . Heart attack Father   . Cancer Sister   . Heart attack Brother   . Diabetes Brother   . Tremor Maternal Aunt   .  Diabetes Paternal Grandmother     Medications: Patient's Medications  New Prescriptions   No medications on file  Previous Medications   ACETAMINOPHEN (TYLENOL) 500 MG TABLET    Take 500 mg by mouth. (STANDING ORDER) take 1 tablet evert 4 hours as need for fever up to 101.  headach and or minor discomfort.  ( don not exceed 2000 mg)  **MAX DOSE IS 3,000/24 HRS FROM ALL SOURCES88   ALUMINUM-MAGNESIUM HYDROXIDE 200-200 MG/5ML SUSPENSION    Take 5 mLs by mouth. (standing order) 30 cc (2 TBSO) orally up to 4 times a day as needed for heartburn or indigestion ** not to exceed 4 doses in 24 hours**   ASPIRIN 81 MG CHEWABLE TABLET    Chew 81 mg by mouth daily. 1 tablet every other day   CALCIUM-VITAMIN D (OSCAL WITH D) 500-200 MG-UNIT PER TABLET    Take 1 tablet by mouth daily.   CHOLECALCIFEROL (VITAMIN D) 2000 UNITS CAPS    Take 1 capsule (2,000 Units total) by mouth daily.   FLUTICASONE (FLONASE) 50 MCG/ACT NASAL SPRAY    Place 2 sprays into the nose as needed.    FUROSEMIDE (LASIX) 20 MG TABLET    Take 1/2 tablet orally every day   GUAIFENESIN (ROBITUSSIN) 100 MG/5ML LIQUID    Take  100 mg by mouth. (Standing Order) 2 tsp ( 10cc) orally evry six hours as need for co;ugh**NOT TO EXCEED 4 DOSES   LEVOTHYROXINE (SYNTHROID, LEVOTHROID) 25 MCG TABLET    Take 25 mcg by mouth daily before breakfast. For hypothyroidism   LOPERAMIDE (IMODIUM) 2 MG CAPSULE    Take 2 mg by mouth. Take one tablet orally with each loose stool as need for diarrhea * NTE 8 doses in 24 hours*   MAGNESIUM HYDROXIDE (MILK OF MAGNESIA) 400 MG/5ML SUSPENSION    Take by mouth daily as needed for constipation.   MIRTAZAPINE (REMERON) 15 MG TABLET    Take 15 mg by mouth at bedtime.   MULTIPLE VITAMINS-MINERALS (ICAPS MV) TABS    Take 2 tablets by mouth. Take 2 capsules daily   POTASSIUM CHLORIDE (K-DUR,KLOR-CON) 10 MEQ TABLET    Take 10 mEq by mouth daily. Take to prevent potassium loss (due to taking fluid pill)   RANITIDINE (ZANTAC)  150 MG CAPSULE    Take 150 mg by mouth as needed.    TRAZODONE (DESYREL) 150 MG TABLET    Take 50 mg by mouth at bedtime as needed for sleep.  Modified Medications   Modified Medication Previous Medication   CARBIDOPA-LEVODOPA (SINEMET IR) 25-100 MG PER TABLET carbidopa-levodopa (SINEMET IR) 25-100 MG per tablet      Take 0.5 tablets by mouth 3 (three) times daily. Follow titration instructions provided. For Parkinson's Disease    Take 0.5 tablets by mouth 3 (three) times daily. Follow titration instructions provided.   GLIPIZIDE (GLUCOTROL) 5 MG TABLET glipiZIDE (GLUCOTROL) 5 MG tablet      Take 5 mg by mouth daily before breakfast. For Diabetes (elevated blood sugar)    Take 1 tablet (5 mg total) by mouth daily before breakfast.   PRAVASTATIN (PRAVACHOL) 20 MG TABLET pravastatin (PRAVACHOL) 20 MG tablet      Take 20 mg by mouth daily. For Cholesterol    Take 1 tablet (20 mg total) by mouth daily.  Discontinued Medications   No medications on file     Physical Exam: Filed Vitals:   01/31/14 0921  BP: 130/82  Pulse: 62  Temp: 98.7 F (37.1 C)  TempSrc: Oral  Weight: 171 lb (77.565 kg)  SpO2: 98%  Physical Exam  Constitutional: She appears well-developed and well-nourished. No distress.  Cardiovascular: Normal rate and regular rhythm.   Pulmonary/Chest: Effort normal and breath sounds normal.  Musculoskeletal: Normal range of motion.  Right knee with tenderness on upper medial aspect and lateral distal aspect  Neurological: She is alert.  Skin: Skin is warm and dry.    Labs reviewed: Basic Metabolic Panel:  Recent Labs  19/14/7810/13/14 1100 01/10/14 0908  NA 139 138  K 4.8 4.6  CL 100 100  CO2 24 23  GLUCOSE 122* 147*  BUN 14 12  CREATININE 0.65 0.71  CALCIUM 9.8 9.4  TSH 4.990*  --    Liver Function Tests:  Recent Labs  08/30/13 1100  AST 11  ALT 7  ALKPHOS 134*  BILITOT 0.3  PROT 6.3  CBC:  Recent Labs  08/30/13 1100 01/10/14 0908  WBC 6.1 5.8  NEUTROABS  3.8 3.4  HGB 13.8 14.1  HCT 42.1 41.3  MCV 92 88   Lipid Panel:  Recent Labs  01/10/14 0908  HDL 62  LDLCALC 157*  TRIG 134  CHOLHDL 4.0   Lab Results  Component Value Date   HGBA1C 6.3* 08/30/2013   Assessment/Plan 1. Osteoarthritis of  right knee -consent obtained by pt and her niece -area was cleansed with betadine, sprayed with local anesthetic, and injection of kenalog and lidocaine 1:1 was given to right medial knee - triamcinolone acetonide (KENALOG-40) injection 40 mg; Inject 1 mL (40 mg total) into the articular space once. -pt noted immediate relief -she was again advised that she should use her walker.  Labs/tests ordered:  Labs before visit as scheduled Next appt:  Keep in sept

## 2014-02-03 DIAGNOSIS — F3289 Other specified depressive episodes: Secondary | ICD-10-CM | POA: Diagnosis not present

## 2014-02-03 DIAGNOSIS — E119 Type 2 diabetes mellitus without complications: Secondary | ICD-10-CM | POA: Diagnosis not present

## 2014-02-03 DIAGNOSIS — I10 Essential (primary) hypertension: Secondary | ICD-10-CM | POA: Diagnosis not present

## 2014-02-03 DIAGNOSIS — G20 Parkinson's disease: Secondary | ICD-10-CM | POA: Diagnosis not present

## 2014-02-03 DIAGNOSIS — E039 Hypothyroidism, unspecified: Secondary | ICD-10-CM | POA: Diagnosis not present

## 2014-02-03 DIAGNOSIS — F329 Major depressive disorder, single episode, unspecified: Secondary | ICD-10-CM | POA: Diagnosis not present

## 2014-02-03 DIAGNOSIS — K219 Gastro-esophageal reflux disease without esophagitis: Secondary | ICD-10-CM | POA: Diagnosis not present

## 2014-03-30 DIAGNOSIS — H353190 Nonexudative age-related macular degeneration, unspecified eye, stage unspecified: Secondary | ICD-10-CM | POA: Diagnosis not present

## 2014-04-01 DIAGNOSIS — H353190 Nonexudative age-related macular degeneration, unspecified eye, stage unspecified: Secondary | ICD-10-CM | POA: Diagnosis not present

## 2014-04-04 DIAGNOSIS — M79609 Pain in unspecified limb: Secondary | ICD-10-CM | POA: Diagnosis not present

## 2014-04-04 DIAGNOSIS — I10 Essential (primary) hypertension: Secondary | ICD-10-CM | POA: Diagnosis not present

## 2014-04-04 DIAGNOSIS — K219 Gastro-esophageal reflux disease without esophagitis: Secondary | ICD-10-CM | POA: Diagnosis not present

## 2014-04-04 DIAGNOSIS — G20 Parkinson's disease: Secondary | ICD-10-CM | POA: Diagnosis not present

## 2014-04-04 DIAGNOSIS — E119 Type 2 diabetes mellitus without complications: Secondary | ICD-10-CM | POA: Diagnosis not present

## 2014-04-04 DIAGNOSIS — E039 Hypothyroidism, unspecified: Secondary | ICD-10-CM | POA: Diagnosis not present

## 2014-04-04 DIAGNOSIS — M7989 Other specified soft tissue disorders: Secondary | ICD-10-CM | POA: Diagnosis not present

## 2014-04-07 DIAGNOSIS — Z79899 Other long term (current) drug therapy: Secondary | ICD-10-CM | POA: Diagnosis not present

## 2014-04-07 DIAGNOSIS — M899 Disorder of bone, unspecified: Secondary | ICD-10-CM | POA: Diagnosis not present

## 2014-04-07 DIAGNOSIS — E119 Type 2 diabetes mellitus without complications: Secondary | ICD-10-CM | POA: Diagnosis not present

## 2014-04-07 DIAGNOSIS — E039 Hypothyroidism, unspecified: Secondary | ICD-10-CM | POA: Diagnosis not present

## 2014-04-07 DIAGNOSIS — M949 Disorder of cartilage, unspecified: Secondary | ICD-10-CM | POA: Diagnosis not present

## 2014-04-07 DIAGNOSIS — E7800 Pure hypercholesterolemia, unspecified: Secondary | ICD-10-CM | POA: Diagnosis not present

## 2014-04-28 DIAGNOSIS — M949 Disorder of cartilage, unspecified: Secondary | ICD-10-CM | POA: Diagnosis not present

## 2014-04-28 DIAGNOSIS — M899 Disorder of bone, unspecified: Secondary | ICD-10-CM | POA: Diagnosis not present

## 2014-05-02 DIAGNOSIS — K219 Gastro-esophageal reflux disease without esophagitis: Secondary | ICD-10-CM | POA: Diagnosis not present

## 2014-05-02 DIAGNOSIS — G20 Parkinson's disease: Secondary | ICD-10-CM | POA: Diagnosis not present

## 2014-05-02 DIAGNOSIS — E039 Hypothyroidism, unspecified: Secondary | ICD-10-CM | POA: Diagnosis not present

## 2014-05-02 DIAGNOSIS — E119 Type 2 diabetes mellitus without complications: Secondary | ICD-10-CM | POA: Diagnosis not present

## 2014-05-02 DIAGNOSIS — I10 Essential (primary) hypertension: Secondary | ICD-10-CM | POA: Diagnosis not present

## 2014-06-10 ENCOUNTER — Ambulatory Visit (INDEPENDENT_AMBULATORY_CARE_PROVIDER_SITE_OTHER): Payer: Medicare Other | Admitting: Neurology

## 2014-06-10 ENCOUNTER — Encounter: Payer: Self-pay | Admitting: Neurology

## 2014-06-10 VITALS — BP 144/82 | HR 68 | Temp 96.9°F | Ht 63.5 in | Wt 171.0 lb

## 2014-06-10 DIAGNOSIS — G20 Parkinson's disease: Secondary | ICD-10-CM | POA: Diagnosis not present

## 2014-06-10 DIAGNOSIS — M25561 Pain in right knee: Secondary | ICD-10-CM

## 2014-06-10 DIAGNOSIS — M25569 Pain in unspecified knee: Secondary | ICD-10-CM

## 2014-06-10 DIAGNOSIS — G20A1 Parkinson's disease without dyskinesia, without mention of fluctuations: Secondary | ICD-10-CM

## 2014-06-10 DIAGNOSIS — R269 Unspecified abnormalities of gait and mobility: Secondary | ICD-10-CM

## 2014-06-10 DIAGNOSIS — Z9181 History of falling: Secondary | ICD-10-CM

## 2014-06-10 DIAGNOSIS — Z8781 Personal history of (healed) traumatic fracture: Secondary | ICD-10-CM

## 2014-06-10 DIAGNOSIS — M810 Age-related osteoporosis without current pathological fracture: Secondary | ICD-10-CM

## 2014-06-10 DIAGNOSIS — E559 Vitamin D deficiency, unspecified: Secondary | ICD-10-CM

## 2014-06-10 DIAGNOSIS — R296 Repeated falls: Secondary | ICD-10-CM

## 2014-06-10 DIAGNOSIS — M25562 Pain in left knee: Secondary | ICD-10-CM

## 2014-06-10 NOTE — Progress Notes (Signed)
Subjective:    Patient ID: Kara Moore is a 79 y.o. female.  HPI    Interim history:  Kara Moore is a very pleasant 79 year old right-handed woman with an underlying medical history of diabetes, hyperlipidemia, hypothyroidism, vitamin D deficiency, reflux disease, and hypertension and osteoporosis who presents for followup consultation of her gait disorder and parkinsonism, probable L sided predominant PD, complicated by recurrent falls. She is accompanied by her niece, Kara Moore, again today as well as [name], Niece from [place]. I last saw her on 01/18/2014, at which time we talked about her using her walker at all times. She was on Remeron for sleep. I prescribed a rolling walker with seat. She indicated that she would not want to use a walker. Today, Kara Moore states, she has not filled that order for the walker. She had a steroid injection into the R knee, which helped marginally. She has been having more pain in the L knee. She had an X ray of the R knee, with results unknown.   I saw her on 09/09/2013, at which time I felt that her gait disorder with recurrent falls was out of proportion to her parkinsonism. I felt that her gait disorder was a function of advancing age, previous pelvic fracture, and arthritis. She had finished inpatient rehabilitation at the time. We talked about her recent additional testing including head CT, EEG, carotid Doppler study, cardiac monitor, all of which were unrevealing. She was advised to continue using her 2 wheeled walker at all times.  I saw her on 06/17/2013 after a recent fall and suggested further workup for a recent episode of confusion and her recurrent falls. I ordered CTH, EEG, C. Doppler, cardiac monitor and suggested a referral to geriatrics. She has since then started seeing Dr. Mariea Clonts.  Her head CT from 06/22/2013 was negative for any acute findings.  Her EEG from 06/25/2013 was normal in the awake and sleep states.  Her carotid Doppler study from  06/25/2013 was negative for any significant stenoses.  Her cardiac event monitor from 07/01/2013 through 07/30/2013 was consistent with normal sinus rhythm with occasional PVCs, no evidence of atrial fibrillation. This was interpreted by Dr. Dola Argyle.  She had an Echocardiogram on 04/15/13 at the [facility] in [place]. She has arthritis, in her R knee and had 2 injections before.  She lives at [facility] in [place]. She feels like "I have legs again". She is tolerating the C/L 25/100 mg 1/2 tid.  I first met her on 03/26/2013 at which time her niece and the patient reported a approximately two-month history of decrease in fine motor skills, decrease in mobility, gait changes and recurrent falls. She reported becoming slower than usual taking longer to dress herself, needing assistance with some of her ADLs. She was started on C/L at the time. She has had urinary urgency. She recently had physical therapy and her therapist noted lack of ability, inability to dress herself and drooling. The patient had to give up driving.  She had a MRI brain on 04/08/13: Abnormal MRI scan of the brain showing mild changes of age-appropriate chronic microvascular ischemia and generalized cerebral atrophy.  On 02/11/13 she fell out of her bed and had difficulty getting back up. Her nephew was visiting from [place], but he could not hear her, as he is hard of hearing and she was calling in a soft voice. She developed rhabdomyolysis. In the latter part of April she was taken to [facility] in [place], then to the  ER for suspected cellulitis. She had an US of the LEs and did not have a blood clot. She was given lasix for swelling. The next day she fell, and then again, and then again the next day. After these falls, she started having care 24/7 through [facility]. She has been in PT since the end of April. She has been using a 2 wheeled walker since then.  She has a strong FHx of tremors on her mother's side (including M,  MGM and cousins). Both of her siblings passed away.  She never had a TIA or a stroke.  She fell at home on 04/30/13, while coming out of the bathroom. She has been having issues with constipation and had taken Dulcolax suppositories, but also took Mag citrate and her niece found her on the floor and she had soiled herself and for some reason had not pushed her Lifeline button. She denied hitting her head or LOC. Her niece called 9 and she was taken to [facility] to the ER and was discharged to home from there. Then she was admitted to inpt rehab at [facility] center on 05/02/13. She fell while in rehab and was found to have a pelvic Fx b/l, reportedly old. She has had some confusion intermittently.    Her Past Medical History Is Significant For: Past Medical History  Diagnosis Date  . Benign essential hypertension   . Diabetes mellitus without complication   . Hyperlipidemia LDL goal < 100   . Hypothyroidism   . Memory loss   . Confusion   . Senile osteoporosis   . Macular degeneration     Her Past Surgical History Is Significant For: Past Surgical History  Procedure Laterality Date  . Adenoidectomy  2011    Her Family History Is Significant For: Family History  Problem Relation Age of Onset  . Tremor Maternal Grandmother   . Diabetes Paternal Grandfather   . Heart attack Father   . Cancer Sister   . Heart attack Brother   . Diabetes Brother   . Tremor Maternal Aunt   . Diabetes Paternal Grandmother     Her Social History Is Significant For: History   Social History  . Marital Status: Unknown    Spouse Name: N/A    Number of Children: N/A  . Years of Education: N/A   Social History Main Topics  . Smoking status: Never Smoker   . Smokeless tobacco: None  . Alcohol Use: No  . Drug Use: No  . Sexual Activity: None   Other Topics Concern  . None   Social History Narrative  . None    Her Allergies Are:  No Known Allergies:   Her Current  Medications Are:  Outpatient Encounter Prescriptions as of 06/10/2014  Medication Sig  . acetaminophen (TYLENOL) 500 MG tablet Take 500 mg by mouth. (STANDING ORDER) take 1 tablet evert 4 hours as need for fever up to 101.  headach and or minor discomfort.  ( don not exceed 2000 mg)  **MAX DOSE IS 3,000/24 HRS FROM ALL SOURCES88  . aluminum-magnesium hydroxide 200-200 MG/5ML suspension Take 5 mLs by mouth. (standing order) 30 cc (2 TBSO) orally up to 4 times a day as needed for heartburn or indigestion ** not to exceed 4 doses in 24 hours**  . aspirin 81 MG chewable tablet Chew 81 mg by mouth daily. 1 tablet every other day  . calcium-vitamin D (OSCAL WITH D) 500-200 MG-UNIT per tablet Take 1 tablet by  mouth daily.  . carbidopa-levodopa (SINEMET IR) 25-100 MG per tablet Take 0.5 tablets by mouth 3 (three) times daily. Follow titration instructions provided. For Parkinson's Disease  . fluticasone (FLONASE) 50 MCG/ACT nasal spray Place 2 sprays into the nose as needed.   . furosemide (LASIX) 20 MG tablet Take 1/2 tablet orally every day  . glipiZIDE (GLUCOTROL) 5 MG tablet Take 5 mg by mouth daily before breakfast. For Diabetes (elevated blood sugar)  . guaiFENesin (ROBITUSSIN) 100 MG/5ML liquid Take 100 mg by mouth. (Standing Order) 2 tsp ( 10cc) orally evry six hours as need for co;ugh**NOT TO EXCEED 4 DOSES  . levothyroxine (SYNTHROID, LEVOTHROID) 25 MCG tablet Take 25 mcg by mouth daily before breakfast. For hypothyroidism  . loperamide (IMODIUM) 2 MG capsule Take 2 mg by mouth. Take one tablet orally with each loose stool as need for diarrhea * NTE 8 doses in 24 hours*  . magnesium hydroxide (MILK OF MAGNESIA) 400 MG/5ML suspension Take by mouth daily as needed for constipation.  . mirtazapine (REMERON) 15 MG tablet Take 15 mg by mouth at bedtime.  . Multiple Vitamins-Minerals (ICAPS MV) TABS Take 2 tablets by mouth. Take 2 capsules daily  . potassium chloride (K-DUR,KLOR-CON) 10 MEQ tablet Take  10 mEq by mouth daily. Take to prevent potassium loss (due to taking fluid pill)  . pravastatin (PRAVACHOL) 20 MG tablet Take 20 mg by mouth daily. For Cholesterol  . ranitidine (ZANTAC) 150 MG capsule Take 150 mg by mouth as needed.   . traZODone (DESYREL) 50 MG tablet Take 50 mg by mouth at bedtime.  . [DISCONTINUED] Cholecalciferol (VITAMIN D) 2000 UNITS CAPS Take 1 capsule (2,000 Units total) by mouth daily.  . [DISCONTINUED] traZODone (DESYREL) 150 MG tablet Take 50 mg by mouth at bedtime as needed for sleep.  :  Review of Systems:  Out of a complete 14 point review of systems, all are reviewed and negative with the exception of these symptoms as listed below:  Review of Systems  Constitutional: Positive for activity change.  HENT: Positive for drooling.   Eyes: Negative.   Respiratory: Negative.   Cardiovascular: Positive for leg swelling.  Gastrointestinal: Negative.   Endocrine: Negative.   Genitourinary: Negative.   Musculoskeletal: Positive for arthralgias.  Skin: Negative.   Allergic/Immunologic: Negative.   Neurological: Negative.   Hematological: Negative.   Psychiatric/Behavioral: Negative.     Objective:  Neurologic Exam  Physical Exam Physical Examination:   Filed Vitals:   06/10/14 1139  BP: 144/82  Pulse: 68  Temp: 96.9 F (36.1 C)    General Examination: The patient is a very pleasant 79 y.o. female in no acute distress. She is calm and cooperative with the exam. She denies Auditory Hallucinations and Visual Hallucinations.   HEENT: Normocephalic, atraumatic, pupils are equal, round and reactive to light and accommodation. Extraocular tracking shows moderate saccadic breakdown without nystagmus noted. Hearing is intact. Face is symmetric with moderate facial masking and normal facial sensation. Excess seborrhoic changes are seen on her face. There is no lip, neck or jaw tremor. Neck is moderately rigid with intact passive ROM. There are no carotid bruits  on auscultation. Oropharynx exam reveals mild mouth dryness. No significant airway crowding is noted. Mallampati is class II. Tongue protrudes centrally and palate elevates symmetrically. Mild drooling is noted.  Chest: is clear to auscultation without wheezing, rhonchi or crackles noted.  Heart: sounds are regular and normal with a 1/6 early systolic murmur, no rubs or gallops noted, unchanged.  Abdomen: is soft, non-tender and non-distended with normal bowel sounds appreciated on auscultation.  Extremities: There is no edema in the distal lower extremities bilaterally. She is wearing compression stockings up to the knees.   Skin: is warm and dry with no trophic changes noted.   Musculoskeletal: exam reveals no obvious joint deformities, tenderness or joint swelling or erythema, with the exception of R knee swelling.  Neurologically:  Mental status: The patient is awake and alert, paying good  attention. She is able to mostly provide the history. Her nieces provide some details. She is oriented to: person, place, time/date, situation, day of week, month of year and year. Her memory, attention, language and knowledge are impaired, mildly. There is no aphasia, agnosia, apraxia or anomia. There is a mild degree of bradyphrenia. Speech is mildly hypophonic with no dysarthria noted. Mood is congruent and affect is normal.   Her MMSE score was 29/30. CDT 4/4. AFT (Animal Fluency Test) score was 14 - in 5/14.  Cranial nerves are as described above under HEENT exam. In addition, shoulder shrug is normal with equal shoulder height noted.  Motor exam: Normal bulk, and strength for age is noted. Tone is mildly rigid with absence of cogwheeling. There is overall moderate bradykinesia. There is no drift or rebound. There is no resting tremor in the RUE, but she has a subtle UE postural tremor b/l and a slight intermittent rest tremor in the LUE.   Romberg is negative. Reflexes are 1+ in the upper  extremities and 1+ in the lower extremities.Fine motor skills: Finger taps, hand movements, and rapid alternating patting are moderately impaired on the L and mild to moderately impaired on the R. Foot taps and foot agility are moderately impaired bilaterally, L worse than R.    Cerebellar testing shows no dysmetria or intention tremor on finger to nose testing. There is no truncal or gait ataxia.   Sensory exam is intact to light touch.   Gait, station and balance: she stands with mild difficulty and pushes herself up and takes about 3 attempts. She has a moderately stooped posture. She walks slowly and insecurely and has some instability when turning. She did not bring her 2 wheeled walker today. Her balance is mild to moderately impaired.   Assessment and Plan:   In summary, SHIFA BRISBON is a very pleasant 79 year old female with a history of slowness, stiffness, recurrent falls, problems with fine motor skills, her posture, as well as drooling. She has Left-sided predominant Parkinson's disease vs parkinsonism, and a significant gait disorder with recurrent falls with injuries including pelvic fracture in the past. Her gait disorder is likely a function of multiple additional issues including advancing age, previous pelvic fracture, arthritis, especially affecting her knees. She is in assisted living, [place]. She is at risk for falling because of prior fall and her gait disorder, abnormal posture, and her parkinsonism as well as bilateral knee pain. In addition she is at risk for injury because of advanced age, history of osteoporosis and vitamin D deficiency, diabetes, advanced age and prior injuries. I would like for her to start using a rolling walker with seat and I prescribed one for her last time. I have asked her to talk to Dr. Mariea Clonts regarding her blood sugar values and additional options she may have for her knee pain. Her steroid injection helped marginally. She may need another  injection but also is hurting on the left side now. Options may include a brace or  perhaps seeing an orthopedic surgeon. Using a walker may take some weight off of her joints which may help. She is advised to continue with the Sinemet at low dose. I would not really try to push the dose too much because of fear of side effects. She has remained fairly stable with her parkinsonian signs. She had prior testing including CTH, EEG, C. Doppler, cardiac monitor, all of which were unrevealing and she has established care with Dr. Mariea Clonts in geriatrics. I would like to see her back in about 4 months from now, sooner if the need arises.

## 2014-06-10 NOTE — Patient Instructions (Addendum)
Use your walker more consistently.  Maybe you can see Dr. Renato Gailseed sooner than September to talk about your blood sugar and your knee pain and explore options.  We will continue low dose parkinson's medication.  Drink more water!

## 2014-06-30 DIAGNOSIS — E559 Vitamin D deficiency, unspecified: Secondary | ICD-10-CM | POA: Diagnosis not present

## 2014-07-19 ENCOUNTER — Other Ambulatory Visit: Payer: Medicare Other

## 2014-07-19 DIAGNOSIS — E039 Hypothyroidism, unspecified: Secondary | ICD-10-CM

## 2014-07-19 DIAGNOSIS — G20 Parkinson's disease: Secondary | ICD-10-CM

## 2014-07-19 DIAGNOSIS — E119 Type 2 diabetes mellitus without complications: Secondary | ICD-10-CM | POA: Diagnosis not present

## 2014-07-19 DIAGNOSIS — E785 Hyperlipidemia, unspecified: Secondary | ICD-10-CM

## 2014-07-19 DIAGNOSIS — I10 Essential (primary) hypertension: Secondary | ICD-10-CM | POA: Diagnosis not present

## 2014-07-19 DIAGNOSIS — G20A1 Parkinson's disease without dyskinesia, without mention of fluctuations: Secondary | ICD-10-CM

## 2014-07-20 LAB — CBC WITH DIFFERENTIAL/PLATELET
Basophils Absolute: 0 10*3/uL (ref 0.0–0.2)
Basos: 0 %
Eos: 2 %
Eosinophils Absolute: 0.1 10*3/uL (ref 0.0–0.4)
HCT: 44.4 % (ref 34.0–46.6)
Hemoglobin: 15 g/dL (ref 11.1–15.9)
Immature Grans (Abs): 0 10*3/uL (ref 0.0–0.1)
Immature Granulocytes: 0 %
Lymphocytes Absolute: 1.4 10*3/uL (ref 0.7–3.1)
Lymphs: 25 %
MCH: 30.9 pg (ref 26.6–33.0)
MCHC: 33.8 g/dL (ref 31.5–35.7)
MCV: 92 fL (ref 79–97)
Monocytes Absolute: 0.5 10*3/uL (ref 0.1–0.9)
Monocytes: 9 %
Neutrophils Absolute: 3.6 10*3/uL (ref 1.4–7.0)
Neutrophils Relative %: 64 %
RBC: 4.85 x10E6/uL (ref 3.77–5.28)
RDW: 13.6 % (ref 12.3–15.4)
WBC: 5.7 10*3/uL (ref 3.4–10.8)

## 2014-07-20 LAB — COMPREHENSIVE METABOLIC PANEL
ALT: 22 IU/L (ref 0–32)
AST: 15 IU/L (ref 0–40)
Albumin/Globulin Ratio: 1.7 (ref 1.1–2.5)
Albumin: 4.3 g/dL (ref 3.5–4.8)
Alkaline Phosphatase: 105 IU/L (ref 39–117)
BUN/Creatinine Ratio: 14 (ref 11–26)
BUN: 11 mg/dL (ref 8–27)
CO2: 21 mmol/L (ref 18–29)
Calcium: 9.7 mg/dL (ref 8.7–10.3)
Chloride: 97 mmol/L (ref 97–108)
Creatinine, Ser: 0.78 mg/dL (ref 0.57–1.00)
GFR calc Af Amer: 85 mL/min/{1.73_m2} (ref >59)
GFR calc non Af Amer: 74 mL/min/{1.73_m2} (ref >59)
Globulin, Total: 2.5 g/dL (ref 1.5–4.5)
Glucose: 202 mg/dL — ABNORMAL HIGH (ref 65–99)
Potassium: 4.8 mmol/L (ref 3.5–5.2)
Sodium: 137 mmol/L (ref 134–144)
Total Bilirubin: 0.4 mg/dL (ref 0.0–1.2)
Total Protein: 6.8 g/dL (ref 6.0–8.5)

## 2014-07-20 LAB — LIPID PANEL
Chol/HDL Ratio: 3 ratio units (ref 0.0–4.4)
Cholesterol, Total: 203 mg/dL — ABNORMAL HIGH (ref 100–199)
HDL: 67 mg/dL (ref >39)
LDL Calculated: 116 mg/dL — ABNORMAL HIGH (ref 0–99)
Triglycerides: 98 mg/dL (ref 0–149)
VLDL Cholesterol Cal: 20 mg/dL (ref 5–40)

## 2014-07-20 LAB — HEMOGLOBIN A1C
Est. average glucose Bld gHb Est-mCnc: 189 mg/dL
Hgb A1c MFr Bld: 8.2 % — ABNORMAL HIGH (ref 4.8–5.6)

## 2014-07-20 LAB — TSH: TSH: 6.55 u[IU]/mL — ABNORMAL HIGH (ref 0.450–4.500)

## 2014-07-21 ENCOUNTER — Encounter: Payer: Self-pay | Admitting: Internal Medicine

## 2014-07-21 ENCOUNTER — Ambulatory Visit (INDEPENDENT_AMBULATORY_CARE_PROVIDER_SITE_OTHER): Payer: Medicare Other | Admitting: Internal Medicine

## 2014-07-21 VITALS — BP 150/78 | HR 74 | Temp 97.4°F | Resp 18 | Ht 63.5 in | Wt 174.2 lb

## 2014-07-21 DIAGNOSIS — Z0000 Encounter for general adult medical examination without abnormal findings: Secondary | ICD-10-CM

## 2014-07-21 DIAGNOSIS — Z5181 Encounter for therapeutic drug level monitoring: Secondary | ICD-10-CM

## 2014-07-21 DIAGNOSIS — I10 Essential (primary) hypertension: Secondary | ICD-10-CM

## 2014-07-21 DIAGNOSIS — G20A1 Parkinson's disease without dyskinesia, without mention of fluctuations: Secondary | ICD-10-CM | POA: Diagnosis not present

## 2014-07-21 DIAGNOSIS — E119 Type 2 diabetes mellitus without complications: Secondary | ICD-10-CM

## 2014-07-21 DIAGNOSIS — M1710 Unilateral primary osteoarthritis, unspecified knee: Secondary | ICD-10-CM | POA: Diagnosis not present

## 2014-07-21 DIAGNOSIS — G20 Parkinson's disease: Secondary | ICD-10-CM | POA: Diagnosis not present

## 2014-07-21 DIAGNOSIS — E039 Hypothyroidism, unspecified: Secondary | ICD-10-CM

## 2014-07-21 DIAGNOSIS — E785 Hyperlipidemia, unspecified: Secondary | ICD-10-CM

## 2014-07-21 DIAGNOSIS — M170 Bilateral primary osteoarthritis of knee: Secondary | ICD-10-CM | POA: Insufficient documentation

## 2014-07-21 MED ORDER — LINAGLIPTIN 5 MG PO TABS: 5.0000 mg | ORAL_TABLET | Freq: Every day | ORAL | Status: DC

## 2014-07-21 MED ORDER — LEVOTHYROXINE SODIUM 50 MCG PO TABS: 50.0000 ug | ORAL_TABLET | Freq: Every day | ORAL | Status: DC

## 2014-07-21 MED ORDER — PRAVASTATIN SODIUM 40 MG PO TABS: 40.0000 mg | ORAL_TABLET | Freq: Every day | ORAL | Status: DC

## 2014-07-21 NOTE — Patient Instructions (Signed)
Please ask your eye doctor to send me their notes. Also please let me know when you get your flu shot.

## 2014-07-21 NOTE — Progress Notes (Signed)
Patient ID: Kara Moore, female   DOB: June 16, 1935, 79 y.o.   MRN: [id number]   Location:  [facility] / [place] [facility]  Code Status: full code, has hcpoa in place  No Known Allergies  Chief Complaint  Patient presents with  . Annual Exam    HPI: Patient is a 79 y.o. white female with leftsided parkinson's seen in the office today for her annual exam.    Knee injections for arthritis (right knee) did not help her last time.  Now left is acting up, too.  Sounds like bones are crunching.  Low back is similar--when went to get out of bed this morning.  Also hurts when she walks a lot.    Had some right groin pain.  After the lady mashed on her with the probe in the area, it got better.    Not walking as much due to her knee pain.  Does want to get back into it.  No falls.    Sugars have been in higher 100s to lower 200s.    Review of Systems:  Review of Systems  Constitutional: Negative for fever, chills and weight loss.  HENT: Negative for congestion.   Eyes: Negative for blurred vision.  Respiratory: Negative for shortness of breath.   Cardiovascular: Negative for chest pain.  Gastrointestinal: Negative for abdominal pain, constipation, blood in stool and melena.  Genitourinary: Negative for dysuria.       Mild urinary incontinence  Musculoskeletal: Negative for falls.       Since last visit  Skin: Negative for rash.  Neurological: Positive for tremors. Negative for dizziness, tingling, loss of consciousness and weakness.  Endo/Heme/Allergies: Does not bruise/bleed easily.  Psychiatric/Behavioral: Positive for memory loss. Negative for depression. The patient has insomnia.     Past Medical History  Diagnosis Date  . Benign essential hypertension   . Diabetes mellitus without complication   . Hyperlipidemia LDL goal < 100   . Hypothyroidism   . Memory loss   . Confusion   . Senile osteoporosis   . Macular degeneration     Past Surgical  History  Procedure Laterality Date  . Adenoidectomy  2011    Social History:   reports that she has never smoked. She does not have any smokeless tobacco history on file. She reports that she does not drink alcohol or use illicit drugs.  Family History  Problem Relation Age of Onset  . Tremor Maternal Grandmother   . Diabetes Paternal Grandfather   . Heart attack Father   . Cancer Sister   . Heart attack Brother   . Diabetes Brother   . Tremor Maternal Aunt   . Diabetes Paternal Grandmother     Medications: Patient's Medications  New Prescriptions   No medications on file  Previous Medications   ACETAMINOPHEN (TYLENOL) 500 MG TABLET    Take 500 mg by mouth. (STANDING ORDER) take 1 tablet evert 4 hours as need for fever up to 101.  headach and or minor discomfort.  ( don not exceed 2000 mg)  **MAX DOSE IS 3,000/24 HRS FROM ALL SOURCES88   ALUMINUM-MAGNESIUM HYDROXIDE 200-200 MG/5ML SUSPENSION    Take 5 mLs by mouth. (standing order) 30 cc (2 TBSO) orally up to 4 times a day as needed for heartburn or indigestion ** not to exceed 4 doses in 24 hours**   ASPIRIN 81 MG CHEWABLE TABLET    Chew 81 mg by mouth daily.    CALCIUM-VITAMIN D (  OSCAL WITH D) 500-200 MG-UNIT PER TABLET    Take 1 tablet by mouth daily.   CARBIDOPA-LEVODOPA (SINEMET IR) 25-100 MG PER TABLET    Take 0.5 tablets by mouth 3 (three) times daily. Follow titration instructions provided. For Parkinson's Disease   FLUTICASONE (FLONASE) 50 MCG/ACT NASAL SPRAY    Place 2 sprays into the nose as needed.    FUROSEMIDE (LASIX) 20 MG TABLET    Take 1/2 tablet orally every day   GLIPIZIDE (GLUCOTROL) 5 MG TABLET    Take 5 mg by mouth daily before breakfast. For Diabetes (elevated blood sugar)   GUAIFENESIN (ROBITUSSIN) 100 MG/5ML LIQUID    Take 100 mg by mouth. (Standing Order) 2 tsp ( 10cc) orally evry six hours as need for co;ugh**NOT TO EXCEED 4 DOSES   HYDROCORTISONE (ANUSOL-HC) 2.5 % RECTAL CREAM    Place 1 application  rectally as needed for hemorrhoids or itching.   LEVOTHYROXINE (SYNTHROID, LEVOTHROID) 25 MCG TABLET    Take 37.5 mcg by mouth daily before breakfast. For hypothyroidism   LOPERAMIDE (IMODIUM) 2 MG CAPSULE    Take 2 mg by mouth. Take one tablet orally with each loose stool as need for diarrhea * NTE 8 doses in 24 hours*   MAGNESIUM HYDROXIDE (MILK OF MAGNESIA) 400 MG/5ML SUSPENSION    Take by mouth daily as needed for constipation.   MIRTAZAPINE (REMERON) 15 MG TABLET    Take 15 mg by mouth at bedtime.   MULTIPLE VITAMINS-MINERALS (ICAPS MV) TABS    Take 2 tablets by mouth. Take 2 capsules daily   NEOMYCIN-BACITRACIN-POLYMYXIN (NEOSPORIN) OINTMENT    Apply 1 application topically as needed for wound care. apply to eye   POTASSIUM CHLORIDE (K-DUR,KLOR-CON) 10 MEQ TABLET    Take 10 mEq by mouth daily. Take to prevent potassium loss (due to taking fluid pill)   PRAVASTATIN (PRAVACHOL) 20 MG TABLET    Take 20 mg by mouth daily. For Cholesterol   RANITIDINE (ZANTAC) 150 MG CAPSULE    Take 150 mg by mouth 2 (two) times daily.    TRAZODONE (DESYREL) 50 MG TABLET    Take 50 mg by mouth at bedtime as needed.   Modified Medications   No medications on file  Discontinued Medications   No medications on file     Physical Exam: Filed Vitals:   07/21/14 1338  BP: 150/78  Pulse: 74  Temp: 97.4 F (36.3 C)  TempSrc: Oral  Resp: 18  Height: 5' 3.5" (1.613 m)  Weight: 174 lb 3.2 oz (79.017 kg)  SpO2: 97%  Physical Exam  Constitutional: She is oriented to person, place, and time. She appears well-developed and well-nourished. No distress.  HENT:  Head: Normocephalic and atraumatic.  Right Ear: External ear normal.  Left Ear: External ear normal.  Nose: Nose normal.  Mouth/Throat: Oropharynx is clear and moist. No oropharyngeal exudate.  Eyes: Conjunctivae and EOM are normal. Pupils are equal, round, and reactive to light.  Neck: Normal range of motion. Neck supple. No JVD present. No tracheal  deviation present. No thyromegaly present.  Cardiovascular: Normal rate, regular rhythm, normal heart sounds and intact distal pulses.   Pulmonary/Chest: Effort normal and breath sounds normal. No respiratory distress. Right breast exhibits inverted nipple. Right breast exhibits no mass, no nipple discharge, no skin change and no tenderness. Left breast exhibits no inverted nipple, no mass, no nipple discharge, no skin change and no tenderness. Breasts are symmetrical. There is no breast swelling.  Abdominal: Soft. Bowel  sounds are normal. She exhibits no distension and no mass. There is no tenderness.  Genitourinary: No breast bleeding.  Musculoskeletal: Normal range of motion. She exhibits no edema and no tenderness.  Unsteady gait, refuses to use walker  Lymphadenopathy:    She has no cervical adenopathy.  Neurological: She is alert and oriented to person, place, and time. She exhibits abnormal muscle tone.  Left sided mild tremor  Skin: Skin is warm and dry.  Psychiatric:  Flat affect    Labs reviewed: Basic Metabolic Panel:  Recent Labs  29/52/84 1100 01/10/14 0908 07/19/14 1007  NA 139 138 137  K 4.8 4.6 4.8  CL 100 100 97  CO2 GLUCOSE 122* 147* 202*  BUN CREATININE 0.65 0.71 0.78  CALCIUM 9.8 9.4 9.7  TSH 4.990*  --  6.550*   Liver Function Tests:  Recent Labs  08/30/13 1100 07/19/14 1007  AST 11 15  ALT 7 22  ALKPHOS 134* 105  BILITOT 0.3 0.4  PROT 6.3 6.8  CBC:  Recent Labs  08/30/13 1100 01/10/14 0908 07/19/14 1007  WBC 6.1 5.8 5.7  NEUTROABS 3.8 3.4 3.6  HGB 13.8 14.1 15.0  HCT 42.1 41.3 44.4  MCV 92 88 92   Lipid Panel:  Recent Labs  01/10/14 0908 07/19/14 1007  HDL 62 67  LDLCALC 157* 116*  TRIG 134 98  CHOLHDL 4.0 3.0   Lab Results  Component Value Date   HGBA1C 8.2* 07/19/2014    Assessment/Plan 1. Parkinson's disease (tremor, stiffness, slow motion, unstable posture) -follows with Dr. Frances Furbish -has been stable  lately w/o significant progression of symptoms -continues on low dose sinemet  2. Type II or unspecified type diabetes mellitus without mention of complication, not stated as uncontrolled - cont glipizide -hba1c has increased significantly since the last evaluation so will add tradjenta to her regimen (due to lack of side effects and did not tolerate higher dose of glipizide in past) - Hemoglobin A1c; Future - Basic metabolic panel; Future  3. Primary osteoarthritis of both knees -has worsened -advised that she will need to see orthopedics for further eval--did not respond to right knee steroid injection  4. Hypothyroidism, unspecified hypothyroidism type -TSH was more elevated -increase synthroid from 37.5 to and sent instructions to make sure it's given properly - TSH; Future  5. Essential hypertension, benign -bp at goal for her age and risk of orthostasis, falls  6. Other and unspecified hyperlipidemia - increase pravachol to  due to LDL still being above 100 - Lipid panel; Future  7. Adult general medical exam -had her annual physical today -no longer needs paps -refuses cscope at this time -had normal mammo and breast exam -has had bone density -will get influenza vaccine at her facility -has had tdap, pneumovax in past, will consider zostavax if she has not gotten this next time  8. Encounter for therapeutic drug monitoring -reviewed genetic testing results which were normal with pt and her daughter who is a pharmacist  Labs/tests ordered:   Orders Placed This Encounter  Procedures  . TSH    Standing Status: Future     Number of Occurrences:      Standing Expiration Date: 01/19/2015  . Lipid panel    Standing Status: Future     Number of Occurrences:      Standing Expiration Date: 01/19/2015    Order Specific Question:  Has the patient fasted?    Answer:  Yes  .  Hemoglobin A1c    Standing Status: Future     Number of Occurrences:      Standing  Expiration Date: 01/19/2015  . Basic metabolic panel    Standing Status: Future     Number of Occurrences:      Standing Expiration Date: 01/19/2015    Order Specific Question:  Has the patient fasted?    Answer:  Yes  functional status:  Independent in function except meds which are given to her  Next appt:  3 mos with fasting labs before

## 2014-07-27 IMAGING — CR DG HIP COMPLETE 2+V*L*
1 series · 2 of 2 positions shown · non-contrast
Comparison: none

REASON FOR EXAM: back pain  leg edema Please Pg on Call provider w/
abnormal result pg [phone]
COMMENTS:  phone [phone]

PROCEDURE:     DXR - DXR HIP LEFT COMPLETE  - February 12, 2013  [DATE]
RESULT:     Left hip images demonstrate no fracture, dislocation or
radiopaque foreign body. The lateral view of the left hip is better on the
images of the left femur.

[Series 1: t hip ap left · 0.14mm/px · 2 of 2 slices shown]
[im 1/2]
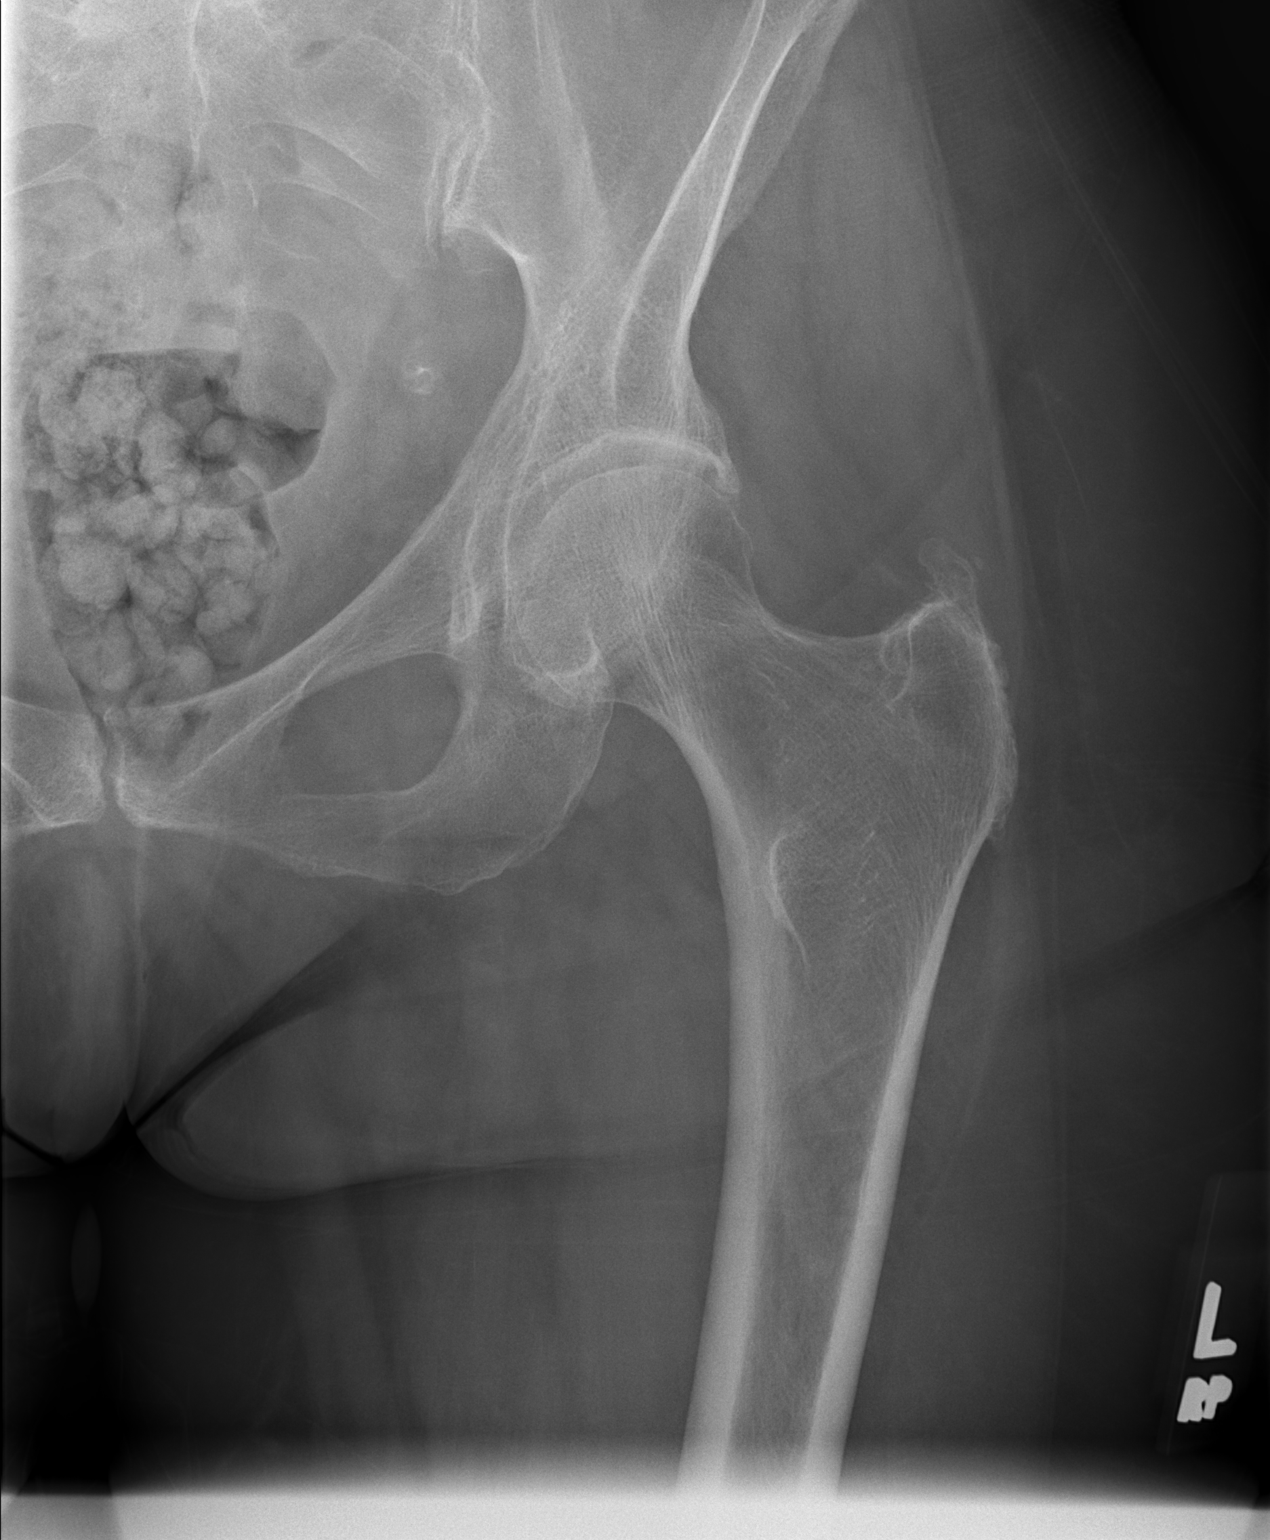
[im 2/2]
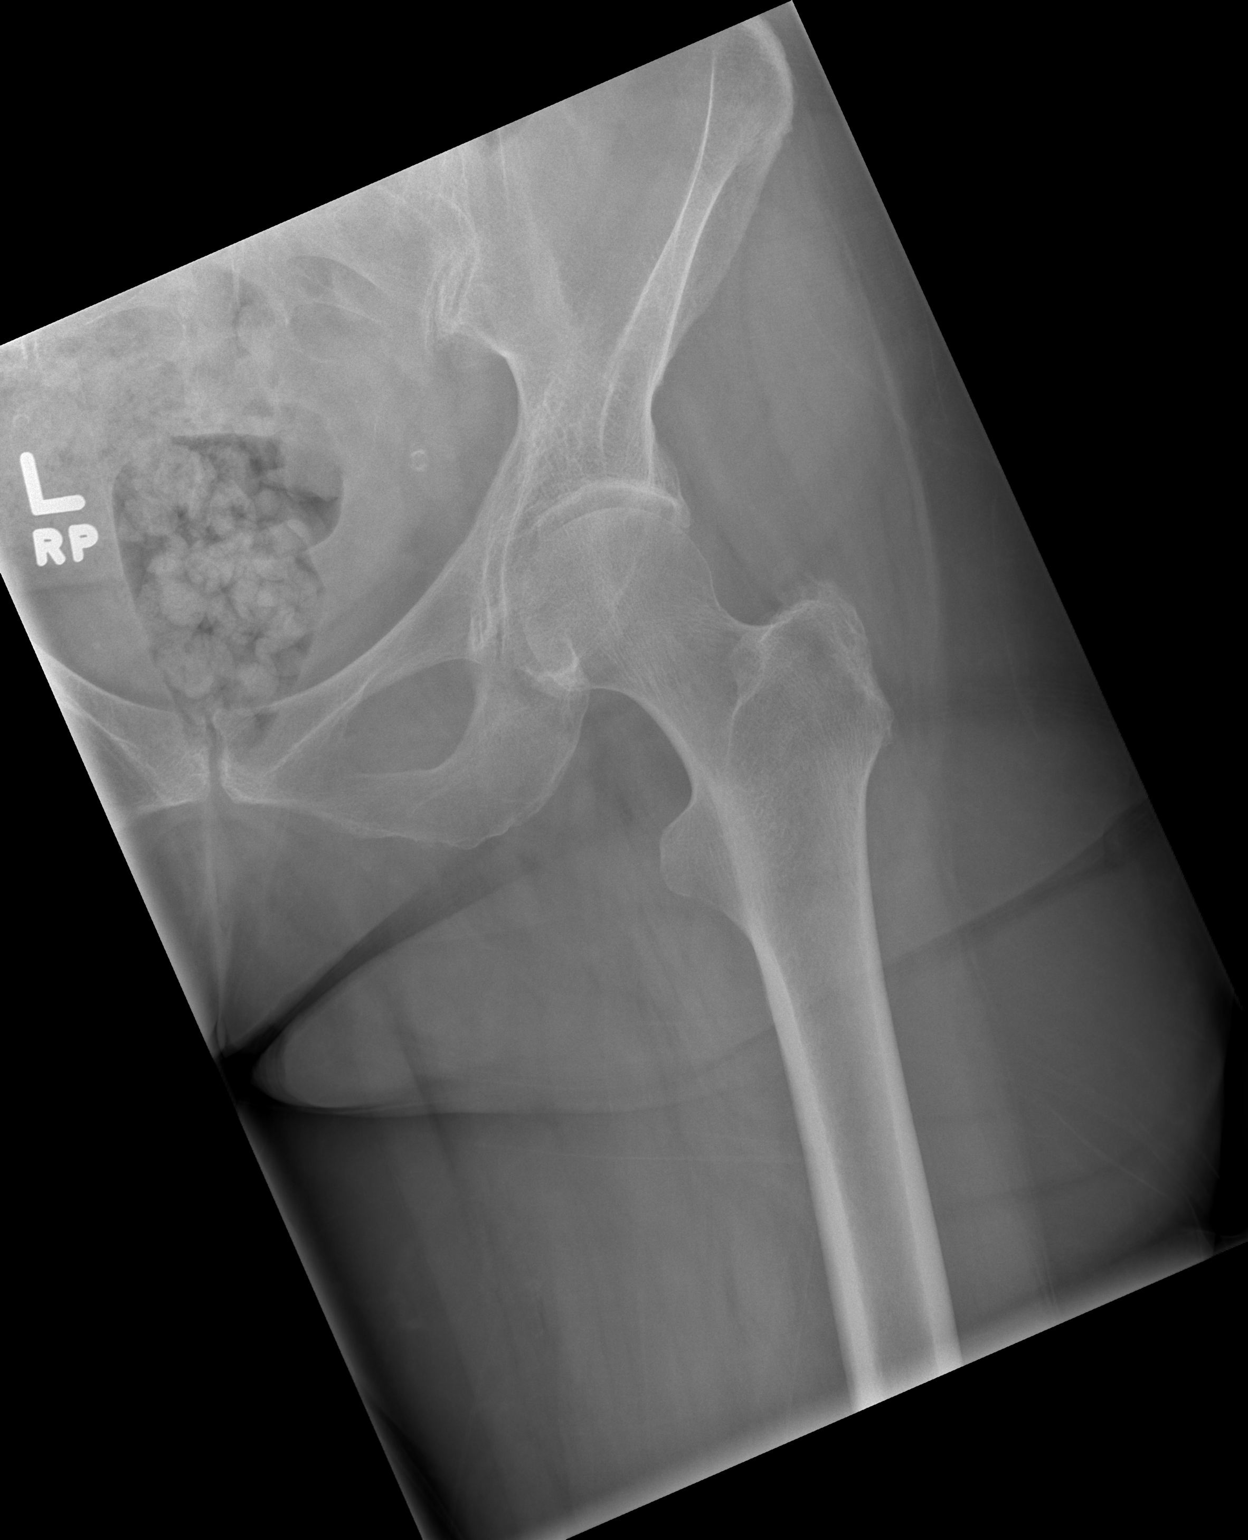

[2 of 2 positions shown; findings below may reference images not displayed]

IMPRESSION: No acute bony abnormality evident.

[REDACTED]

## 2014-07-27 IMAGING — CR DG FEMUR 2V*L*
1 series · 4 of 4 positions shown · non-contrast
Comparison: none

REASON FOR EXAM: back pain  leg edema Please Pg on Call provider w/
abnormal result pg [phone]
COMMENTS:  phone [phone] [DATE]

PROCEDURE:     DXR - DXR FEMUR LEFT  - February 12, 2013  [DATE]
RESULT:     Left femur images demonstrate no evidence of cortical thinning,
periosteal reaction, fracture or foreign body.

[Series 1: t femur proximal ap left · 0.14mm/px · 4 of 4 slices shown]
[im 1/4]
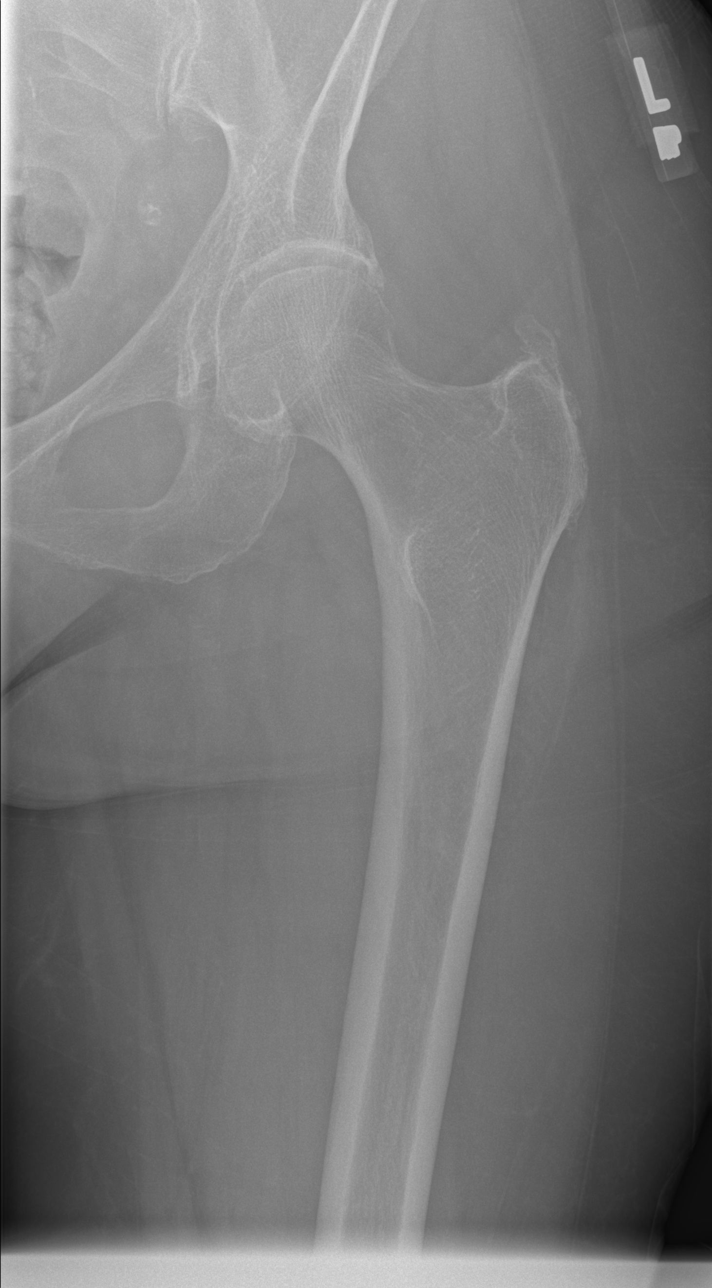
[im 2/4]
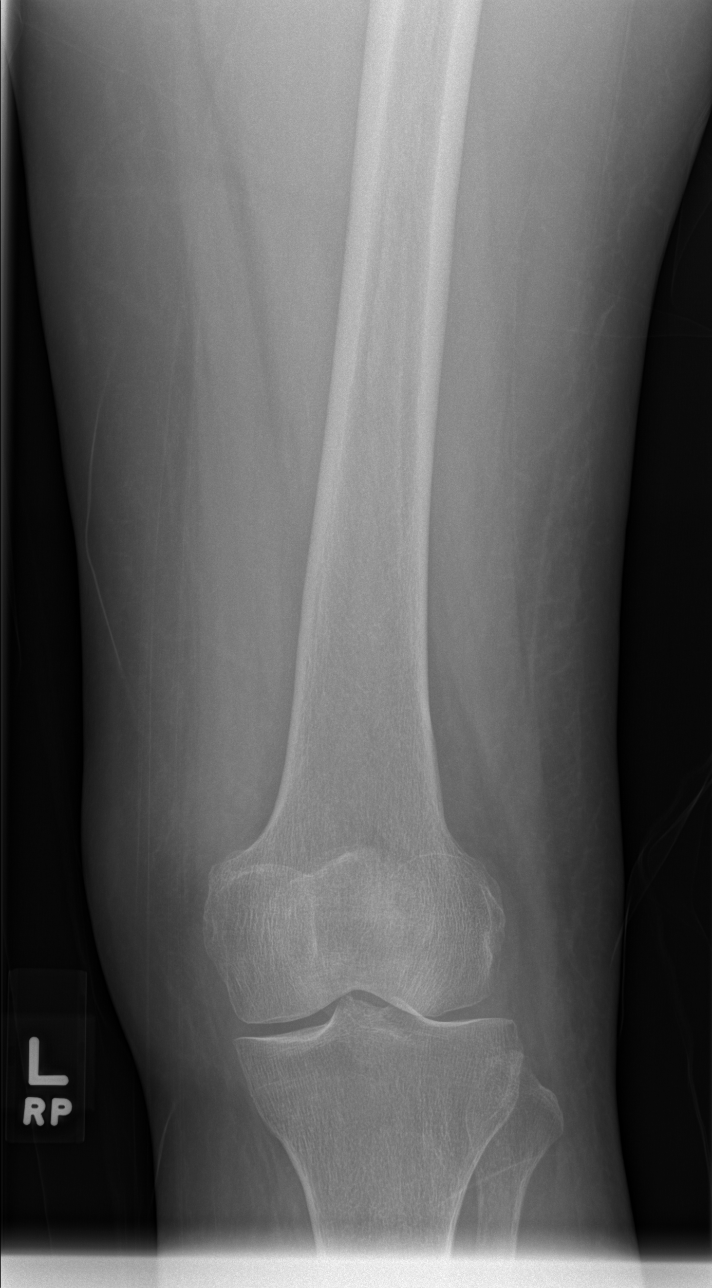
[im 3/4]
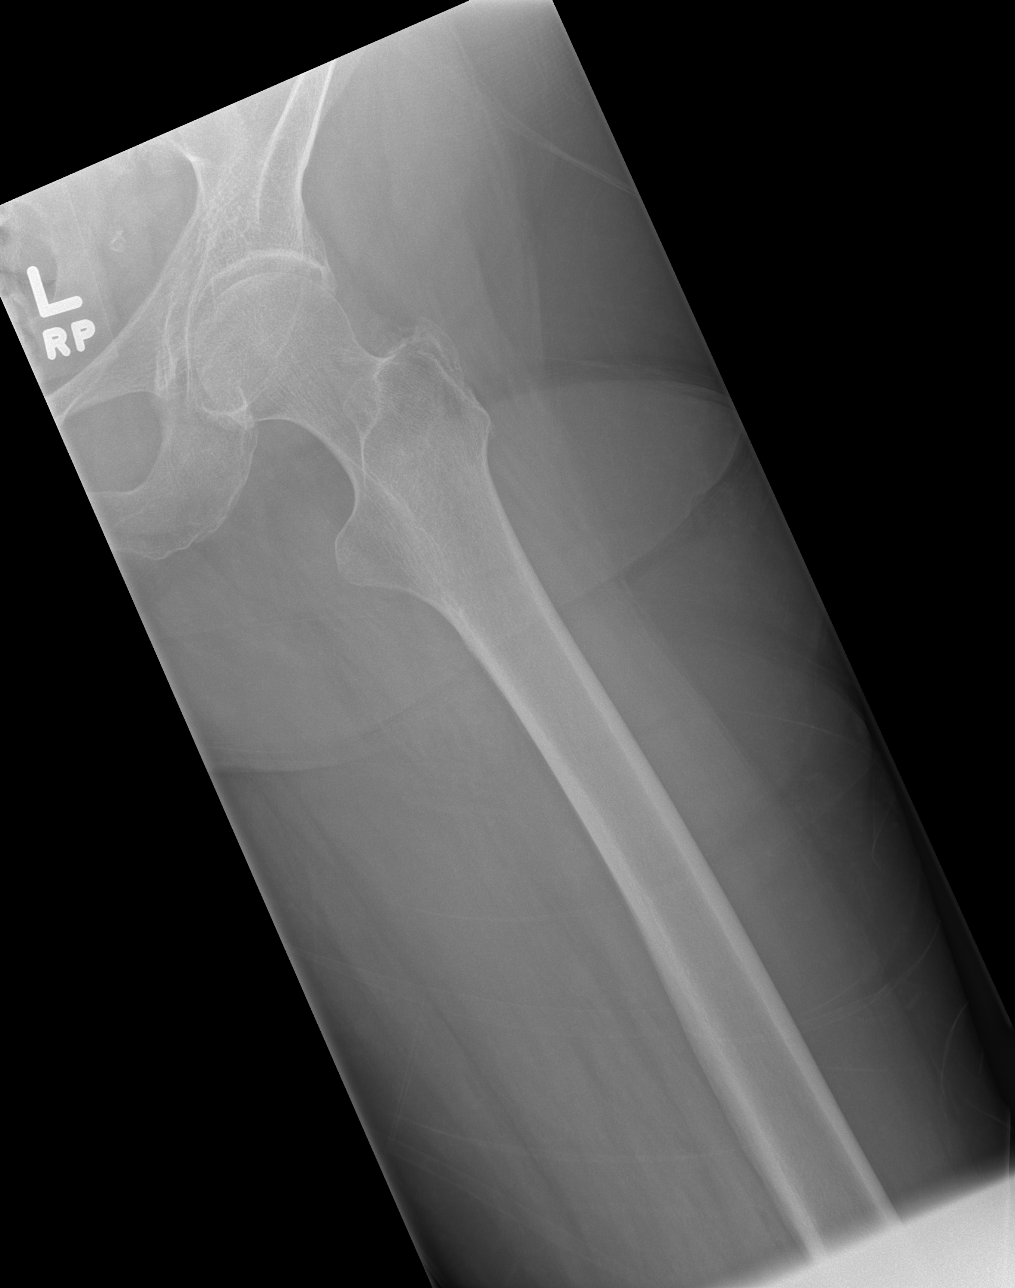
[im 4/4]
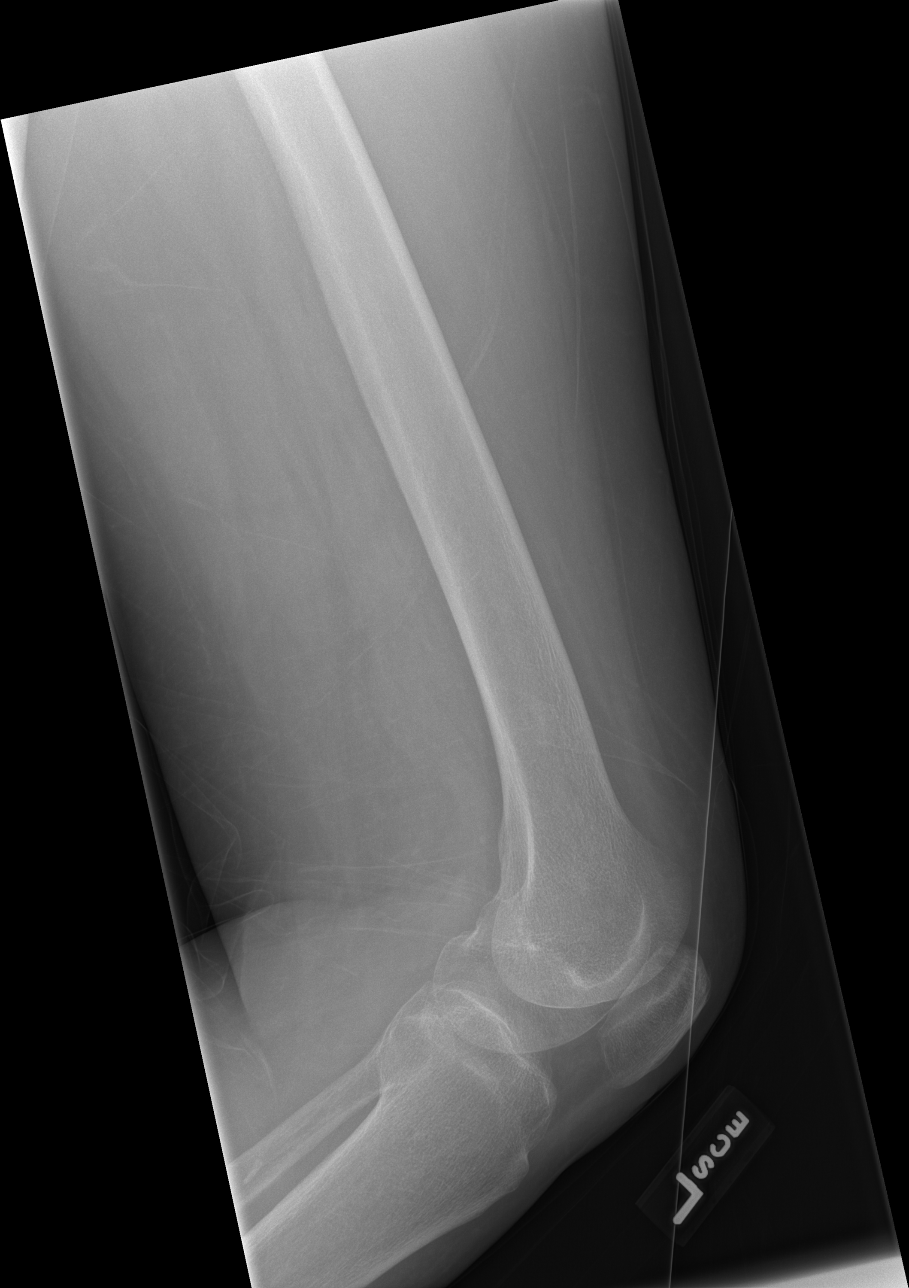

[4 of 4 positions shown; findings below may reference images not displayed]

IMPRESSION: No acute bony abnormality of the left femur.

[REDACTED]

## 2014-08-11 DIAGNOSIS — Z23 Encounter for immunization: Secondary | ICD-10-CM | POA: Diagnosis not present

## 2014-08-20 IMAGING — CT CT OF THE LEFT HIP WITHOUT CONTRAST
1 series · 16 of 32 positions shown, 20 images · non-contrast
Comparison: None

REASON FOR EXAM: fell c/o severe pain
COMMENTS:

PROCEDURE:     CT  - CT HIP LEFT WITHOUT CONTRAST  - March 08, 2013  [DATE]
RESULT:     History: Fall
TECHNIQUE: Multiple axial images obtained of the left hip with sagittal and
coronal reformatted images provided.

[Series 2: hip 3.0 b70s · axial · 0.39mm/px · z∈[-300,-174]mm · 16 of 47 slices shown, 20 images]
[im 3/47  soft-tissue]
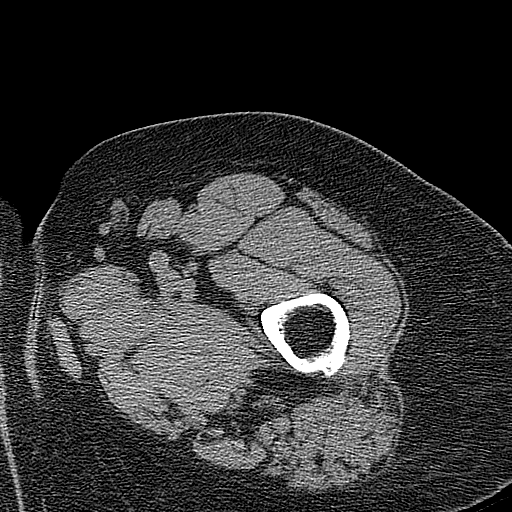
[im 3/47  bone]
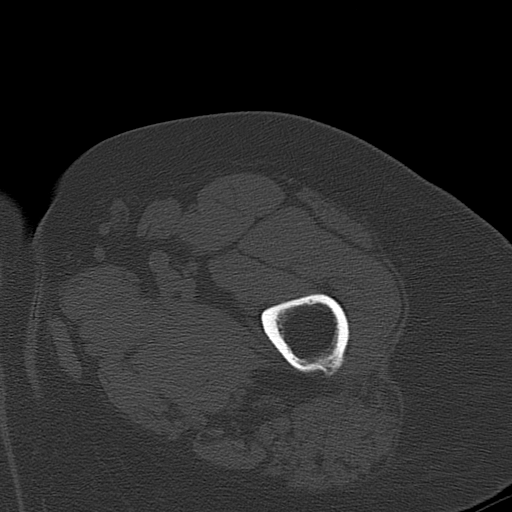
[im 6/47  soft-tissue]
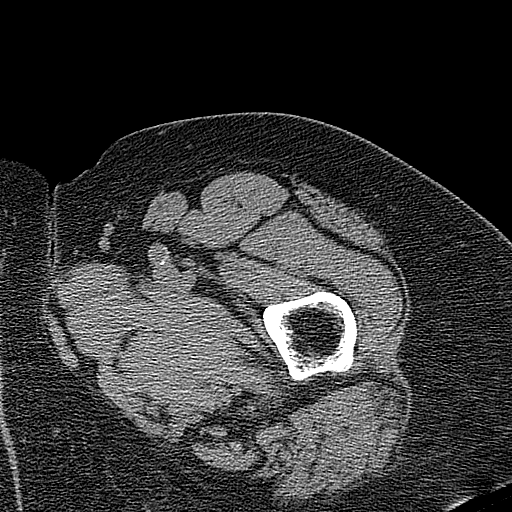
[im 9/47  soft-tissue]
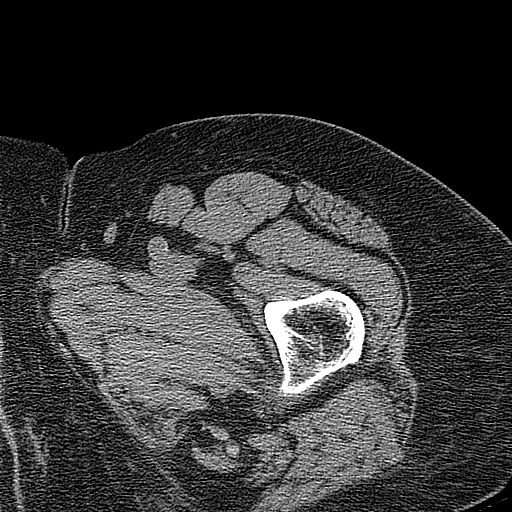
[im 12/47  soft-tissue]
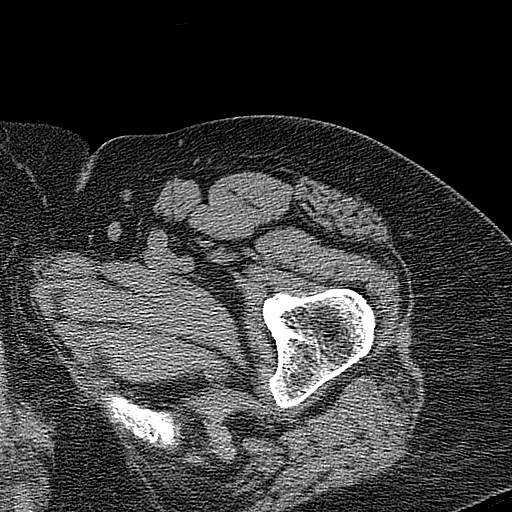
[im 15/47  soft-tissue]
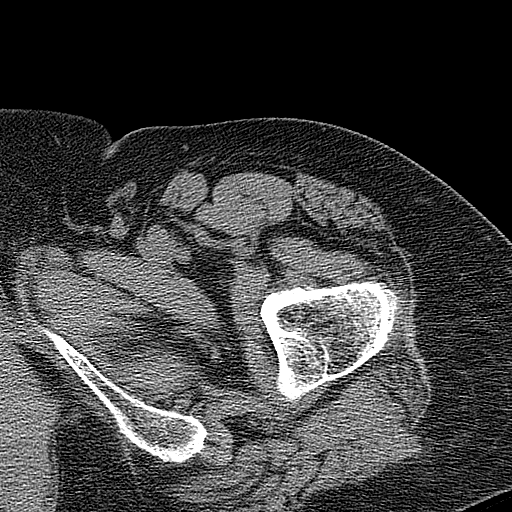
[im 18/47  soft-tissue]
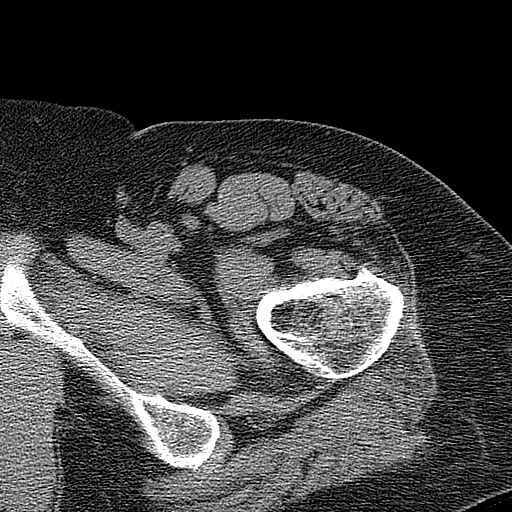
[im 21/47  soft-tissue]
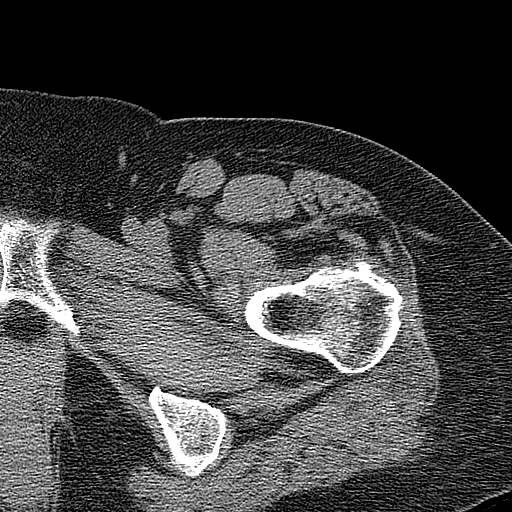
[im 26/47  soft-tissue]
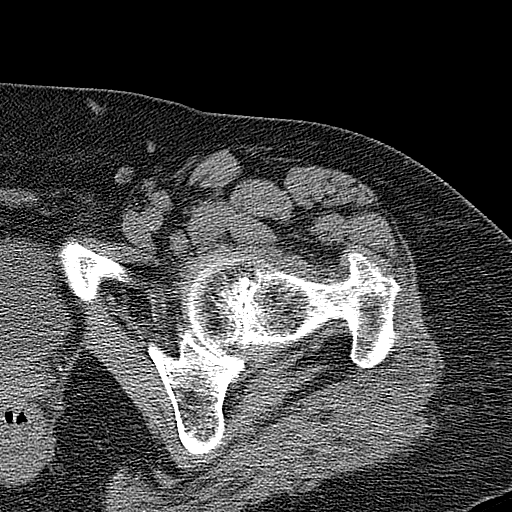
[im 29/47  soft-tissue]
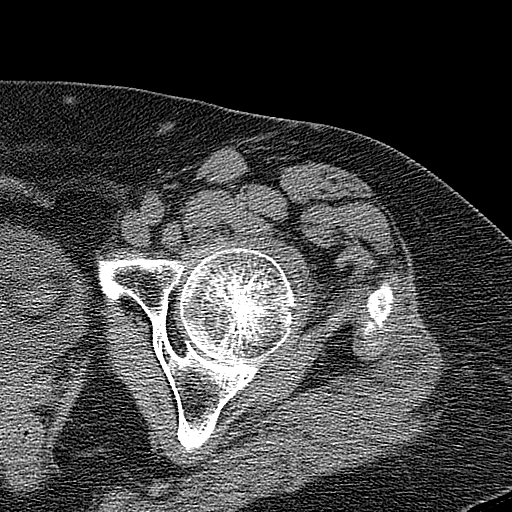
[im 29/47  bone]
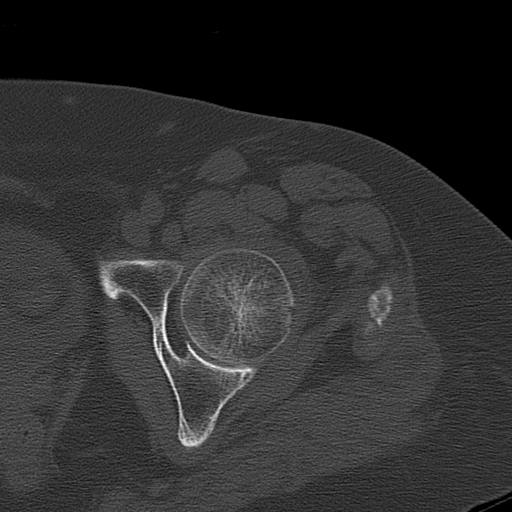
[im 32/47  soft-tissue]
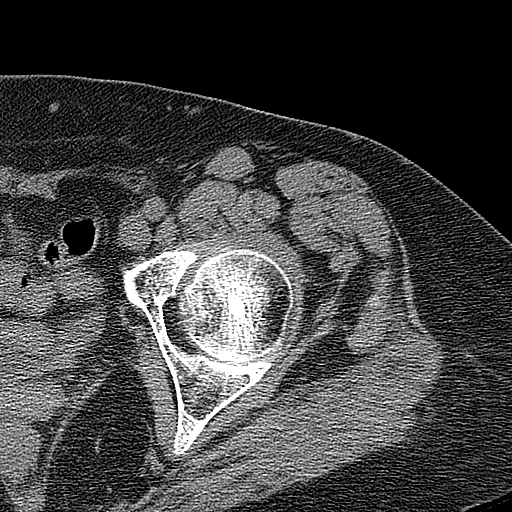
[im 35/47  soft-tissue]
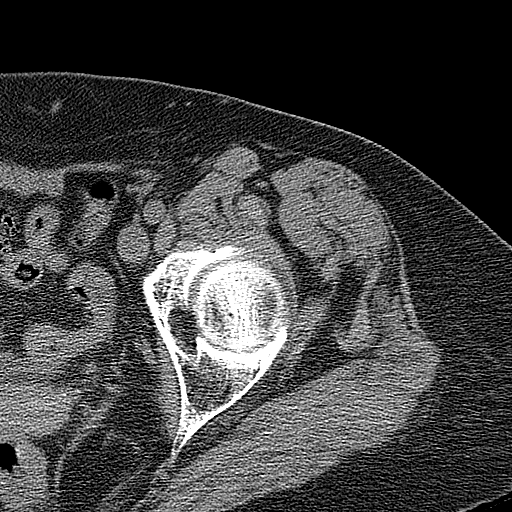
[im 38/47  soft-tissue]
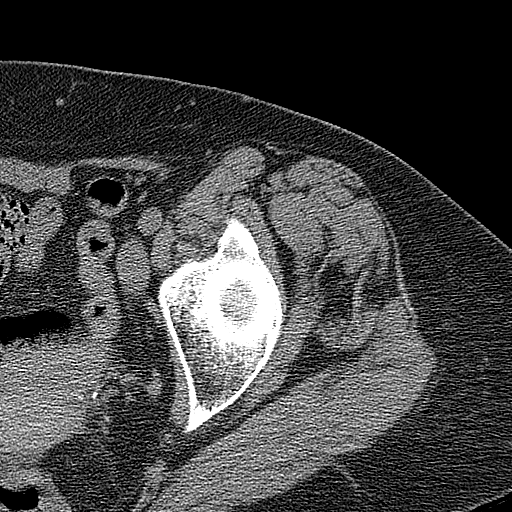
[im 41/47  soft-tissue]
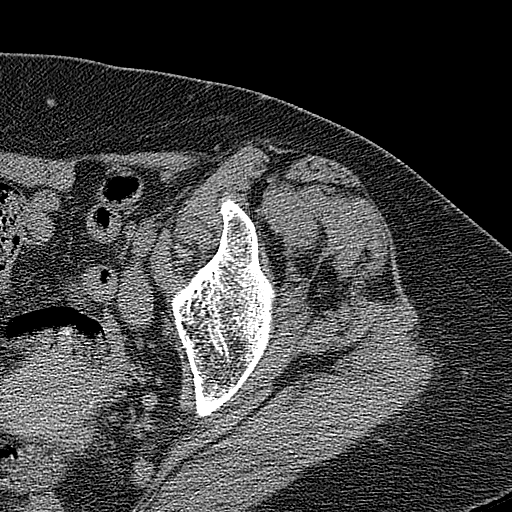
[im 41/47  lung]
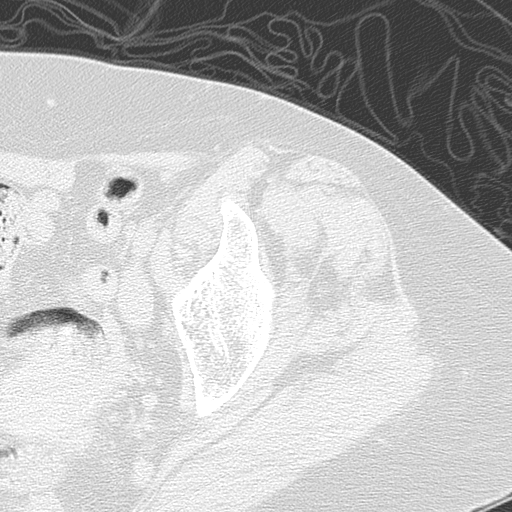
[im 42/47  lung]
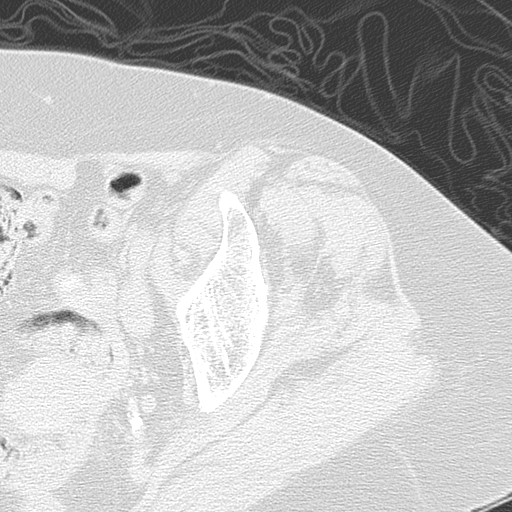
[im 44/47  soft-tissue]
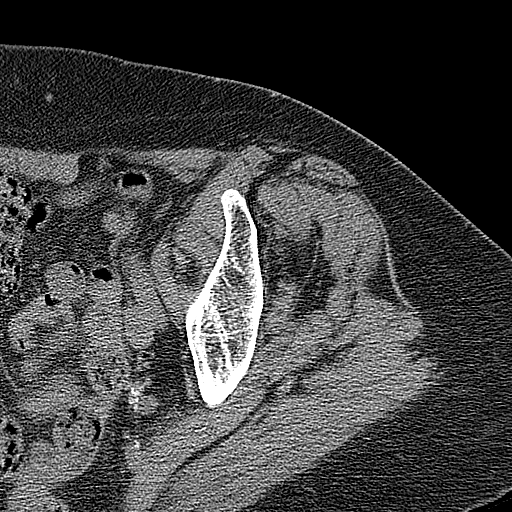
[im 44/47  lung]
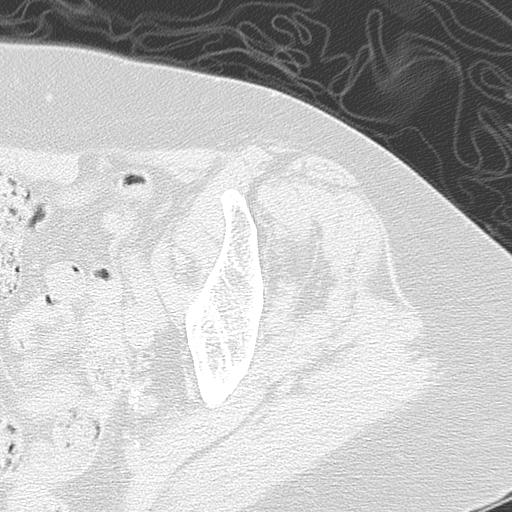
[im 45/47  lung]
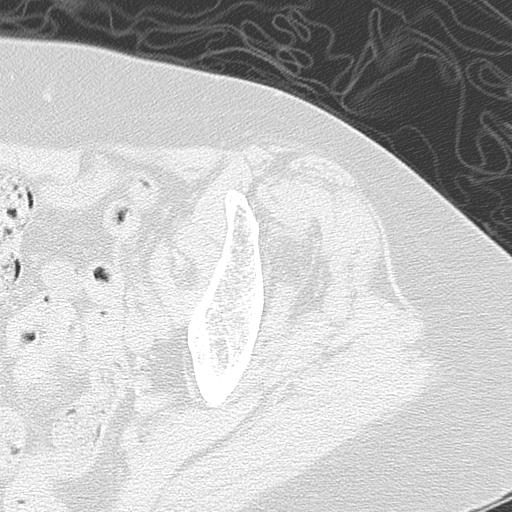

[16 of 32 positions shown; findings below may reference images not displayed]

FINDINGS: There is no left hip fracture or dislocation. There is minimal degenerative
change of the left hip. The left superior and inferior pubic rami are
intact. The surrounding soft tissues are normal.
IMPRESSION: No left hip fracture or dislocation.

## 2014-08-20 IMAGING — CR DG HIP COMPLETE 2+V*L*
1 series · 3 of 3 positions shown · non-contrast
Comparison: none

REASON FOR EXAM: fall left hip pain
COMMENTS:

PROCEDURE:     DXR - DXR HIP LEFT COMPLETE  - March 08, 2013  [DATE]
RESULT:     Left hip images demonstrate no definite fracture, dislocation or
radiopaque foreign body.

[Series 9: t hip ap left · 0.14mm/px · 3 of 3 slices shown]
[im 1/3]
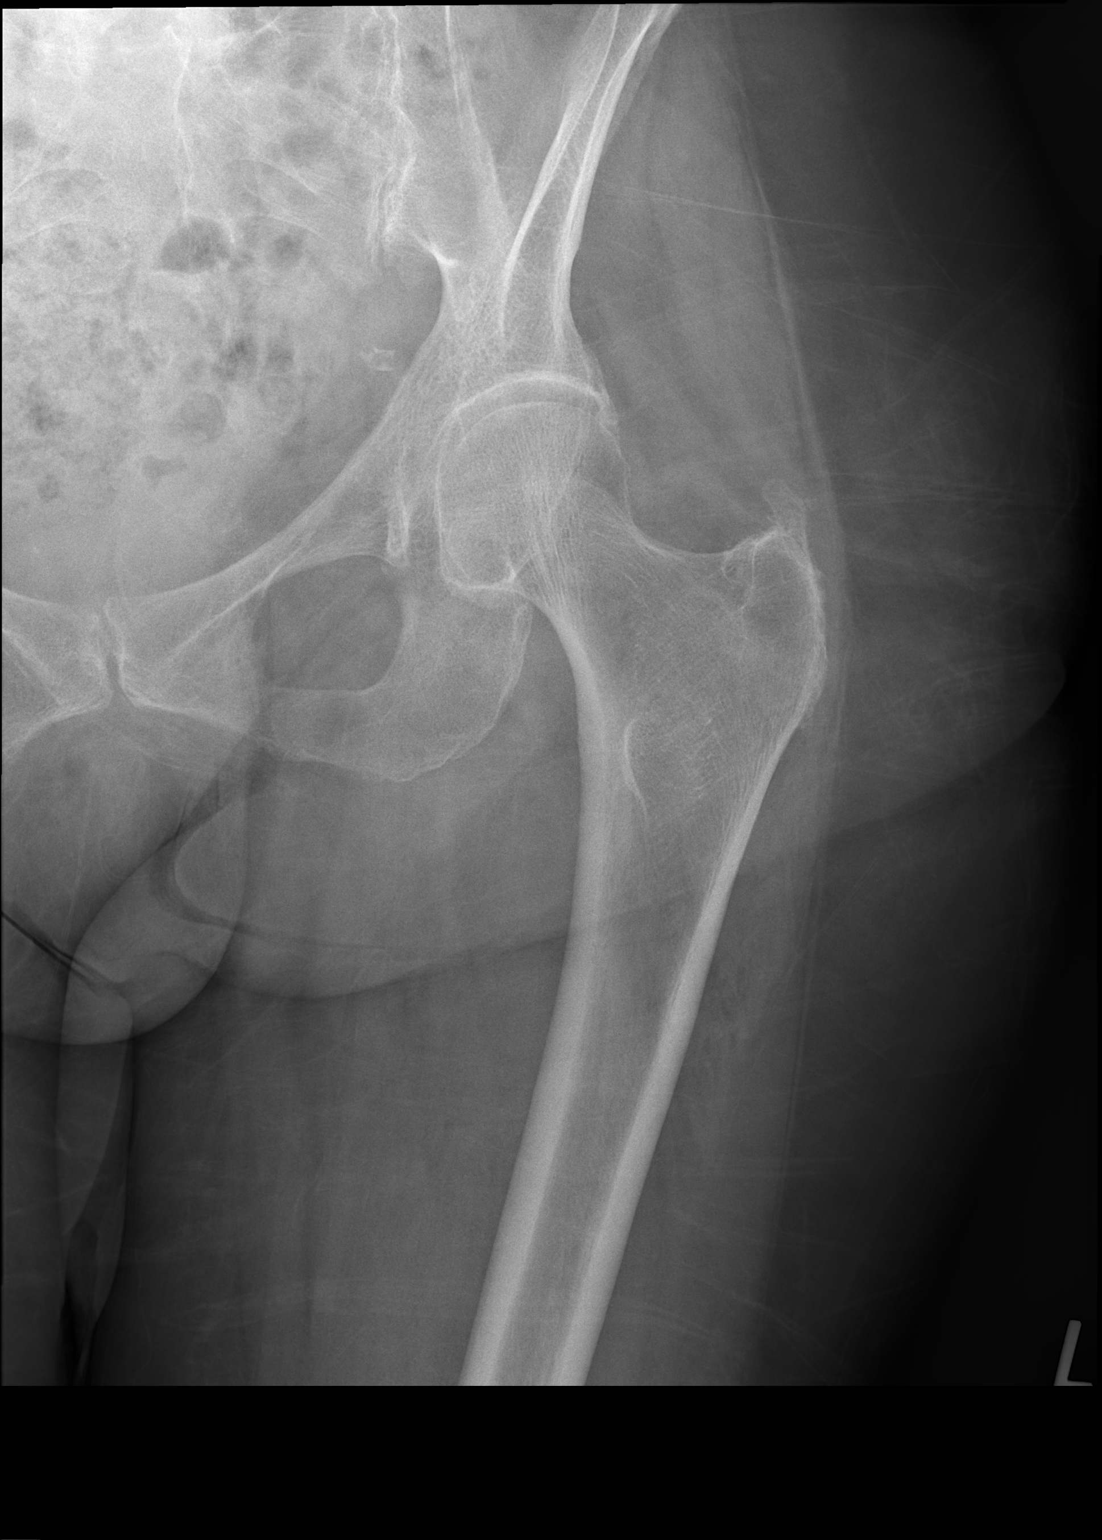
[im 2/3]
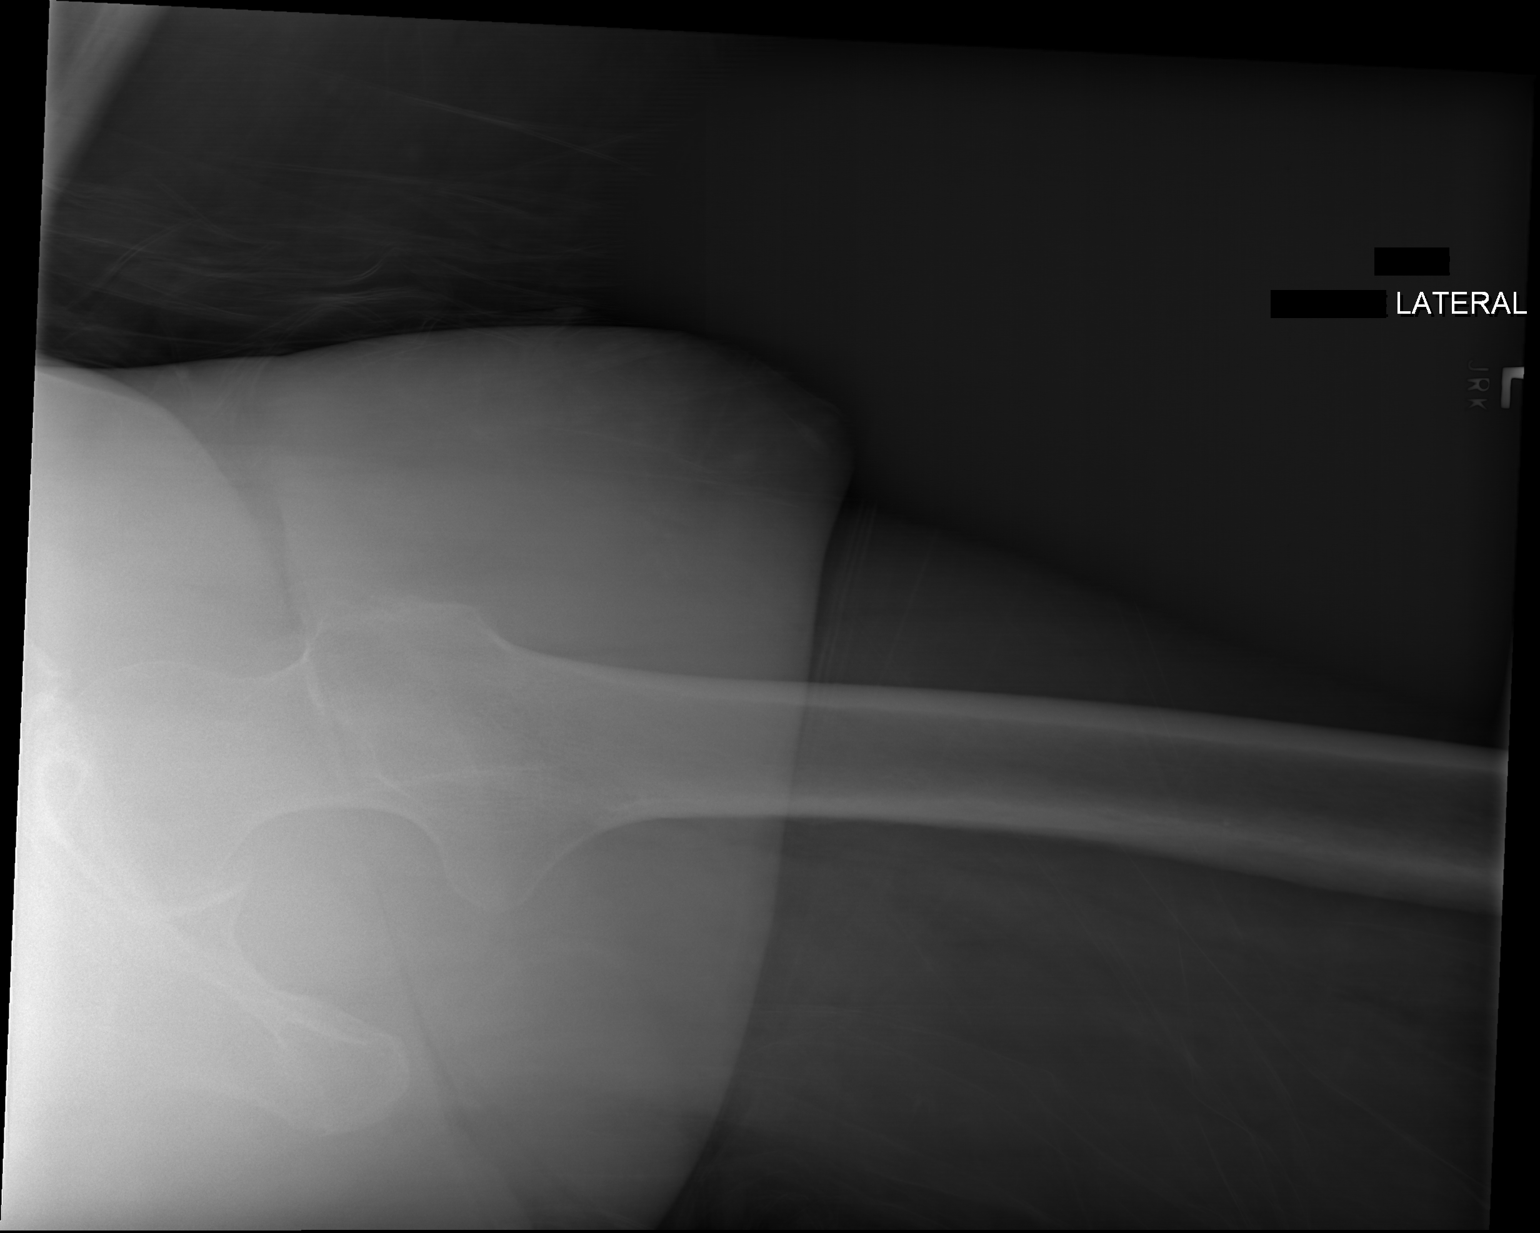
[im 3/3]
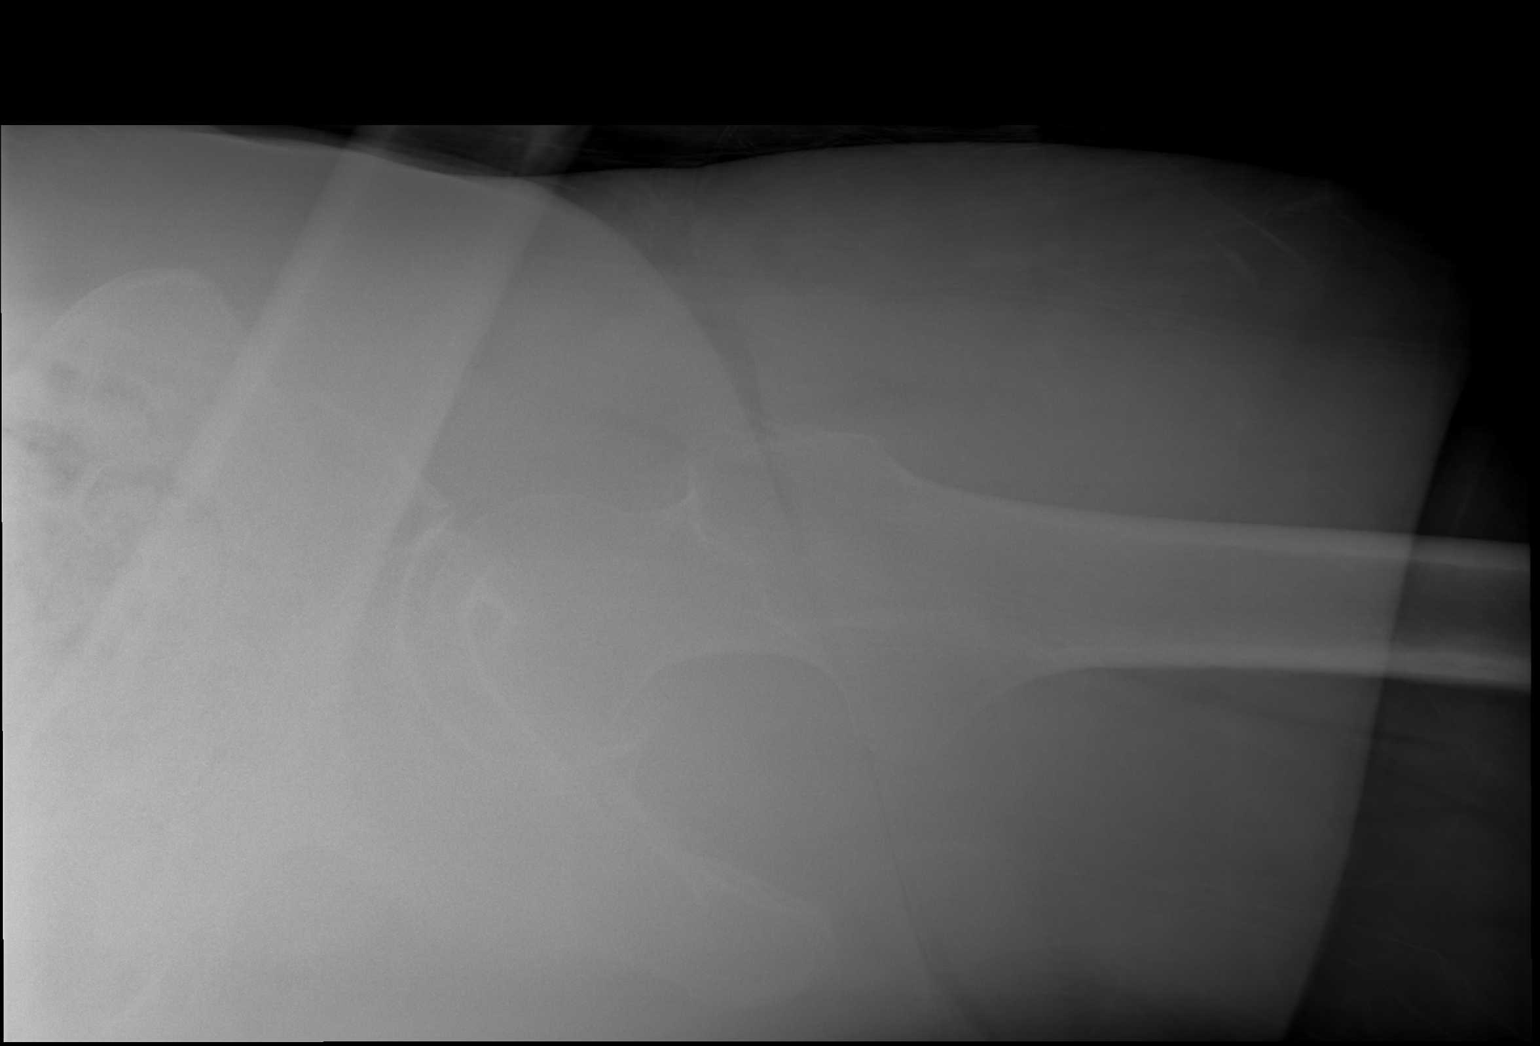

[3 of 3 positions shown; findings below may reference images not displayed]

IMPRESSION: Please see above.

[REDACTED]

## 2014-09-20 DIAGNOSIS — E559 Vitamin D deficiency, unspecified: Secondary | ICD-10-CM | POA: Diagnosis not present

## 2014-10-06 ENCOUNTER — Encounter: Payer: Self-pay | Admitting: *Deleted

## 2014-10-10 DIAGNOSIS — H2513 Age-related nuclear cataract, bilateral: Secondary | ICD-10-CM | POA: Diagnosis not present

## 2014-10-10 LAB — HM DIABETES EYE EXAM

## 2014-10-24 ENCOUNTER — Other Ambulatory Visit: Payer: Medicare Other

## 2014-10-24 DIAGNOSIS — E039 Hypothyroidism, unspecified: Secondary | ICD-10-CM | POA: Diagnosis not present

## 2014-10-24 DIAGNOSIS — E119 Type 2 diabetes mellitus without complications: Secondary | ICD-10-CM

## 2014-10-24 DIAGNOSIS — E785 Hyperlipidemia, unspecified: Secondary | ICD-10-CM | POA: Diagnosis not present

## 2014-10-27 DIAGNOSIS — G20 Parkinson's disease: Secondary | ICD-10-CM | POA: Diagnosis not present

## 2014-10-27 DIAGNOSIS — M25561 Pain in right knee: Secondary | ICD-10-CM | POA: Diagnosis not present

## 2014-10-27 DIAGNOSIS — E119 Type 2 diabetes mellitus without complications: Secondary | ICD-10-CM | POA: Diagnosis not present

## 2014-10-27 DIAGNOSIS — Z9181 History of falling: Secondary | ICD-10-CM | POA: Diagnosis not present

## 2014-10-27 LAB — BASIC METABOLIC PANEL
BUN/Creatinine Ratio: 20 (ref 11–26)
BUN: 16 mg/dL (ref 8–27)
CO2: 22 mmol/L (ref 18–29)
Calcium: 9.6 mg/dL (ref 8.7–10.3)
Chloride: 101 mmol/L (ref 97–108)
Creatinine, Ser: 0.79 mg/dL (ref 0.57–1.00)
GFR calc Af Amer: 84 mL/min/{1.73_m2} (ref >59)
GFR calc non Af Amer: 72 mL/min/{1.73_m2} (ref >59)
Glucose: 133 mg/dL — ABNORMAL HIGH (ref 65–99)
Potassium: 4.4 mmol/L (ref 3.5–5.2)
Sodium: 137 mmol/L (ref 134–144)

## 2014-10-27 LAB — LIPID PANEL
Chol/HDL Ratio: 2.8 ratio units (ref 0.0–4.4)
Cholesterol, Total: 169 mg/dL (ref 100–199)
HDL: 61 mg/dL (ref >39)
LDL Calculated: 93 mg/dL (ref 0–99)
Triglycerides: 74 mg/dL (ref 0–149)
VLDL Cholesterol Cal: 15 mg/dL (ref 5–40)

## 2014-10-27 LAB — TSH: TSH: 3.23 u[IU]/mL (ref 0.450–4.500)

## 2014-10-28 ENCOUNTER — Encounter: Payer: Self-pay | Admitting: Internal Medicine

## 2014-10-28 ENCOUNTER — Ambulatory Visit (INDEPENDENT_AMBULATORY_CARE_PROVIDER_SITE_OTHER): Payer: Medicare Other | Admitting: Internal Medicine

## 2014-10-28 VITALS — BP 132/84 | HR 78 | Temp 97.9°F | Resp 10 | Ht 64.0 in | Wt 173.0 lb

## 2014-10-28 DIAGNOSIS — E785 Hyperlipidemia, unspecified: Secondary | ICD-10-CM

## 2014-10-28 DIAGNOSIS — E039 Hypothyroidism, unspecified: Secondary | ICD-10-CM

## 2014-10-28 DIAGNOSIS — I10 Essential (primary) hypertension: Secondary | ICD-10-CM

## 2014-10-28 DIAGNOSIS — Z23 Encounter for immunization: Secondary | ICD-10-CM | POA: Diagnosis not present

## 2014-10-28 DIAGNOSIS — I872 Venous insufficiency (chronic) (peripheral): Secondary | ICD-10-CM

## 2014-10-28 DIAGNOSIS — E119 Type 2 diabetes mellitus without complications: Secondary | ICD-10-CM

## 2014-10-28 DIAGNOSIS — M810 Age-related osteoporosis without current pathological fracture: Secondary | ICD-10-CM | POA: Diagnosis not present

## 2014-10-28 DIAGNOSIS — G20A1 Parkinson's disease without dyskinesia, without mention of fluctuations: Secondary | ICD-10-CM

## 2014-10-28 DIAGNOSIS — G20 Parkinson's disease: Secondary | ICD-10-CM | POA: Diagnosis not present

## 2014-10-28 DIAGNOSIS — M170 Bilateral primary osteoarthritis of knee: Secondary | ICD-10-CM

## 2014-10-28 MED ORDER — CHOLECALCIFEROL 50 MCG (2000 UT) PO CAPS: 1.0000 | ORAL_CAPSULE | Freq: Every day | ORAL | Status: DC

## 2014-10-28 MED ORDER — TRAMADOL HCL 50 MG PO TABS: 50.0000 mg | ORAL_TABLET | Freq: Two times a day (BID) | ORAL | Status: DC

## 2014-10-28 NOTE — Progress Notes (Signed)
Patient ID: Kara Moore, female   DOB: Aug 10, 1935, 79 y.o.   MRN: [id number]   Location:  [facility]  No Known Allergies  Chief Complaint  Patient presents with  . Medical Management of Chronic Issues    3 month follow-up   . Generalized Body Aches    Patient c/o aches in legs and feet (getting worse)     HPI: Patient is a 79 y.o.  seen in the office today for medical mgt of chronic diseases.    Is having freezing moving forward walking.  She has extreme difficulty getting up out of her chair.  She also is having muscle aches in her legs.  No significant change in tremors.  Drooling more.  Swallowing unchanged other than not swallowing saliva.  Sees Dr. Frances Furbish next 12/08/14.    Not short of breath.    Right knee crunches and hurts when bends down or when walking sometimes.  Left one also hurts.  Both are swollen.   Using tylenol for pain.    CBGs mostly in 130s-140s by pt's record.  Review of Systems:  Review of Systems  Constitutional: Negative for fever and chills.  HENT: Negative for congestion.   Eyes: Negative for blurred vision.  Respiratory: Negative for shortness of breath.   Cardiovascular: Positive for leg swelling. Negative for chest pain.  Gastrointestinal: Negative for abdominal pain, constipation, blood in stool and melena.  Genitourinary: Negative for dysuria.  Musculoskeletal: Positive for myalgias and joint pain. Negative for falls.  Skin: Negative for rash.  Neurological: Negative for dizziness and loss of consciousness.  Endo/Heme/Allergies: Bruises/bleeds easily.  Psychiatric/Behavioral: Positive for memory loss. Negative for depression.     Past Medical History  Diagnosis Date  . Benign essential hypertension   . Diabetes mellitus without complication   . Hyperlipidemia LDL goal < 100   . Hypothyroidism   . Memory loss   . Confusion   . Senile osteoporosis   . Macular degeneration     Past Surgical  History  Procedure Laterality Date  . Adenoidectomy  2011    Social History:   reports that she has never smoked. She does not have any smokeless tobacco history on file. She reports that she does not drink alcohol or use illicit drugs.  Family History  Problem Relation Age of Onset  . Tremor Maternal Grandmother   . Diabetes Paternal Grandfather   . Heart attack Father   . Cancer Sister   . Heart attack Brother   . Diabetes Brother   . Tremor Maternal Aunt   . Diabetes Paternal Grandmother     Medications: Patient's Medications  New Prescriptions   No medications on file  Previous Medications   ACETAMINOPHEN (TYLENOL) 500 MG TABLET    Take 500 mg by mouth. (STANDING ORDER) take 1 tablet evert 4 hours as need for fever up to 101.  headach and or minor discomfort.  ( don not exceed 2000 mg)  **MAX DOSE IS 3,000/24 HRS FROM ALL SOURCES88   ALUMINUM-MAGNESIUM HYDROXIDE 200-200 MG/5ML SUSPENSION    Take 5 mLs by mouth. (standing order) 30 cc (2 TBSO) orally up to 4 times a day as needed for heartburn or indigestion ** not to exceed 4 doses in 24 hours**   ASPIRIN 81 MG CHEWABLE TABLET    Chew 81 mg by mouth daily.    CALCIUM-VITAMIN D (OSCAL WITH D) 500-200 MG-UNIT PER TABLET    Take 1 tablet by  mouth daily.   CARBIDOPA-LEVODOPA (SINEMET IR) 25-100 MG PER TABLET    Take 0.5 tablets by mouth 3 (three) times daily. Follow titration instructions provided. For Parkinson's Disease   FLUTICASONE (FLONASE) 50 MCG/ACT NASAL SPRAY    Place 2 sprays into the nose as needed.    FUROSEMIDE (LASIX) 20 MG TABLET    Take 1/2 tablet orally every day   GLIPIZIDE (GLUCOTROL) 5 MG TABLET    Take 5 mg by mouth daily before breakfast. For Diabetes (elevated blood sugar)   GUAIFENESIN (ROBITUSSIN) 100 MG/5ML LIQUID    Take 100 mg by mouth. (Standing Order) 2 tsp ( 10cc) orally evry six hours as need for co;ugh**NOT TO EXCEED 4 DOSES   HYDROCORTISONE (ANUSOL-HC) 2.5 % RECTAL CREAM    Place 1 application  rectally as needed for hemorrhoids or itching.   LEVOTHYROXINE (SYNTHROID, LEVOTHROID) 50 MCG TABLET    Take 1 tablet (50 mcg total) by mouth daily before breakfast. For hypothyroidism   LINAGLIPTIN (TRADJENTA) 5 MG TABS TABLET    Take 1 tablet (5 mg total) by mouth daily. For diabetes   LOPERAMIDE (IMODIUM) 2 MG CAPSULE    Take 2 mg by mouth. Take one tablet orally with each loose stool as need for diarrhea * NTE 8 doses in 24 hours*   MAGNESIUM HYDROXIDE (MILK OF MAGNESIA) 400 MG/5ML SUSPENSION    Take by mouth daily as needed for constipation.   MIRTAZAPINE (REMERON) 15 MG TABLET    Take 15 mg by mouth at bedtime.   MULTIPLE VITAMINS-MINERALS (ICAPS MV) TABS    Take 2 tablets by mouth. Take 2 capsules daily   NEOMYCIN-BACITRACIN-POLYMYXIN (NEOSPORIN) OINTMENT    Apply 1 application topically as needed for wound care. apply to eye   POTASSIUM CHLORIDE (K-DUR,KLOR-CON) 10 MEQ TABLET    Take 10 mEq by mouth daily. Take to prevent potassium loss (due to taking fluid pill)   PRAVASTATIN (PRAVACHOL) 40 MG TABLET    Take 1 tablet (40 mg total) by mouth daily. For Cholesterol   RANITIDINE (ZANTAC) 150 MG CAPSULE    Take 150 mg by mouth 2 (two) times daily.    TRAZODONE (DESYREL) 50 MG TABLET    Take 50 mg by mouth at bedtime as needed.   Modified Medications   No medications on file  Discontinued Medications   No medications on file     Physical Exam: Filed Vitals:   10/28/14 1002  BP: 132/84  Pulse: 78  Temp: 97.9 F (36.6 C)  TempSrc: Oral  Resp: 10  Height: 5\' 4"  (1.626 m)  Weight: 173 lb (78.472 kg)  SpO2: 96%  Physical Exam  Constitutional: She appears well-developed and well-nourished. No distress.  Neck: No JVD present.  Cardiovascular: Normal rate, regular rhythm, normal heart sounds and intact distal pulses.   1+ pitting edema, wearing compression hose  Pulmonary/Chest: Effort normal and breath sounds normal. She has no rales.  Abdominal: Soft. Bowel sounds are normal. She  exhibits no distension and no mass. There is no tenderness.  Musculoskeletal: She exhibits tenderness.  Slight cogwheeling of left greater than right arm, masked facies, drooling; tenderness behind left knee and of both knees with mild edema at joints  Neurological: She is alert.  Skin: Skin is warm and dry.  Psychiatric: She has a normal mood and affect.     Labs reviewed: Basic Metabolic Panel:  Recent Labs  16/08/9601/23/15 0908 07/19/14 1007 10/24/14 0920  NA 138 137 137  K 4.6 4.8  4.4  CL 100 97 101  CO2 23 21 22   GLUCOSE 147* 202* 133*  BUN 12 11 16   CREATININE 0.71 0.78 0.79  CALCIUM 9.4 9.7 9.6  TSH  --  6.550* 3.230   Liver Function Tests:  Recent Labs  07/19/14 1007  AST 15  ALT 22  ALKPHOS 105  BILITOT 0.4  PROT 6.8   No results for input(s): LIPASE, AMYLASE in the last 8760 hours. No results for input(s): AMMONIA in the last 8760 hours. CBC:  Recent Labs  01/10/14 0908 07/19/14 1007  WBC 5.8 5.7  NEUTROABS 3.4 3.6  HGB 14.1 15.0  HCT 41.3 44.4  MCV 88 92   Lipid Panel:  Recent Labs  01/10/14 0908 07/19/14 1007 10/24/14 0920  HDL 62 67 61  LDLCALC 157* 116* 93  TRIG 134 98 74  CHOLHDL 4.0 3.0 2.8   Lab Results  Component Value Date   HGBA1C 8.2* 07/19/2014    Assessment/Plan 1. Parkinson's disease (tremor, stiffness, slow motion, unstable posture) - cont sinemet -keep f/u with Dr. Frances Furbish -more therapy is a reasonable consideration -must use walker at all times  2. Essential hypertension, benign -only on lasix for fluid at this point -check urine microalbumin to see if needs ace - Basic metabolic panel; Future  3. Primary osteoarthritis of both knees -not controlled adequately with tylenol;  Gait also worsening with more stiffness and freezing from PD - add traMADol (ULTRAM) 50 MG tablet; Take 1 tablet (50 mg total) by mouth 2 (two) times daily.  Dispense: 60 tablet; Refill: 3 (she previously was given this during a period where her  pain was worse but her facility never called for a renewal) 4. Hypothyroidism, unspecified hypothyroidism type - cont synthroid at current dose - CBC With differential/Platelet; Future - TSH; Future  5. Chronic venous insufficiency -cont compression hose -would not increase lasix due to her urinary frequency and incontinence being bad enough and she does not have shortness of breath -Basic metabolic panel; Future - CBC With differential/Platelet; Future  6. Senile osteoporosis -cont ca with D but restart vitamin D 2000 units that I have previously ordered but somehow pt did not receive  7. Diabetes mellitus type 2, uncomplicated - has been well controlled based on cbgs but these are fasting--await hba1c result which didn't get released with other labs and check urine microalbumin - Microalbumin/Creatinine Ratio, Urine - Hemoglobin A1c; Future - Pneumococcal conjugate vaccine 13-valent  8. Hyperlipidemia -lipids at goal, cont pravachol  9. Need for vaccination with 13-polyvalent pneumococcal conjugate vaccine - Pneumococcal conjugate vaccine 13-valent given  Labs/tests ordered:   Orders Placed This Encounter  Procedures  . Pneumococcal conjugate vaccine 13-valent  . Microalbumin/Creatinine Ratio, Urine  . Basic metabolic panel    Standing Status: Future     Number of Occurrences:      Standing Expiration Date: 04/29/2015  . CBC With differential/Platelet    Standing Status: Future     Number of Occurrences:      Standing Expiration Date: 04/29/2015  . Hemoglobin A1c    Standing Status: Future     Number of Occurrences:      Standing Expiration Date: 04/29/2015  . TSH    Standing Status: Future     Number of Occurrences:      Standing Expiration Date: 04/29/2015    Next appt:  3 mos with labs before  Carmella Kees L. Sariyah Corcino, D.O. Geriatrics [facility] [address] [place],  [place] [id number] Cell Phone (Mon-Fri 8am-5pm):  [phone] On  Call:  [phone] & follow prompts after 5pm & weekends Office Phone:  [phone] Office Fax:  [phone]

## 2014-10-29 LAB — MICROALBUMIN / CREATININE URINE RATIO
Creatinine, Ur: 160 mg/dL (ref 15.0–278.0)
MICROALB/CREAT RATIO: 8.9 mg/g creat (ref 0.0–30.0)
Microalbumin, Urine: 14.2 ug/mL (ref 0.0–17.0)

## 2014-10-29 LAB — HEMOGLOBIN A1C
Est. average glucose Bld gHb Est-mCnc: 186 mg/dL
Hgb A1c MFr Bld: 8.1 % — ABNORMAL HIGH (ref 4.8–5.6)

## 2014-11-03 ENCOUNTER — Other Ambulatory Visit: Payer: Self-pay | Admitting: Internal Medicine

## 2014-11-04 DIAGNOSIS — G20 Parkinson's disease: Secondary | ICD-10-CM | POA: Diagnosis not present

## 2014-11-04 DIAGNOSIS — M25561 Pain in right knee: Secondary | ICD-10-CM | POA: Diagnosis not present

## 2014-11-04 DIAGNOSIS — Z9181 History of falling: Secondary | ICD-10-CM | POA: Diagnosis not present

## 2014-11-04 DIAGNOSIS — E119 Type 2 diabetes mellitus without complications: Secondary | ICD-10-CM | POA: Diagnosis not present

## 2014-11-06 DIAGNOSIS — Z9181 History of falling: Secondary | ICD-10-CM | POA: Diagnosis not present

## 2014-11-06 DIAGNOSIS — G20 Parkinson's disease: Secondary | ICD-10-CM | POA: Diagnosis not present

## 2014-11-06 DIAGNOSIS — M25561 Pain in right knee: Secondary | ICD-10-CM | POA: Diagnosis not present

## 2014-11-06 DIAGNOSIS — E119 Type 2 diabetes mellitus without complications: Secondary | ICD-10-CM | POA: Diagnosis not present

## 2014-11-07 ENCOUNTER — Encounter: Payer: Self-pay | Admitting: Internal Medicine

## 2014-11-08 DIAGNOSIS — G20 Parkinson's disease: Secondary | ICD-10-CM | POA: Diagnosis not present

## 2014-11-08 DIAGNOSIS — M25561 Pain in right knee: Secondary | ICD-10-CM | POA: Diagnosis not present

## 2014-11-08 DIAGNOSIS — Z9181 History of falling: Secondary | ICD-10-CM | POA: Diagnosis not present

## 2014-11-08 DIAGNOSIS — E119 Type 2 diabetes mellitus without complications: Secondary | ICD-10-CM | POA: Diagnosis not present

## 2014-11-09 DIAGNOSIS — G20 Parkinson's disease: Secondary | ICD-10-CM | POA: Diagnosis not present

## 2014-11-09 DIAGNOSIS — Z9181 History of falling: Secondary | ICD-10-CM | POA: Diagnosis not present

## 2014-11-09 DIAGNOSIS — M25561 Pain in right knee: Secondary | ICD-10-CM | POA: Diagnosis not present

## 2014-11-09 DIAGNOSIS — E119 Type 2 diabetes mellitus without complications: Secondary | ICD-10-CM | POA: Diagnosis not present

## 2014-11-11 ENCOUNTER — Emergency Department: Payer: Self-pay | Admitting: Emergency Medicine

## 2014-11-11 DIAGNOSIS — M25562 Pain in left knee: Secondary | ICD-10-CM | POA: Diagnosis not present

## 2014-11-11 DIAGNOSIS — M79605 Pain in left leg: Secondary | ICD-10-CM | POA: Diagnosis not present

## 2014-11-11 DIAGNOSIS — I10 Essential (primary) hypertension: Secondary | ICD-10-CM | POA: Diagnosis not present

## 2014-11-11 DIAGNOSIS — R609 Edema, unspecified: Secondary | ICD-10-CM | POA: Diagnosis not present

## 2014-11-11 DIAGNOSIS — Z7982 Long term (current) use of aspirin: Secondary | ICD-10-CM | POA: Diagnosis not present

## 2014-11-11 DIAGNOSIS — E119 Type 2 diabetes mellitus without complications: Secondary | ICD-10-CM | POA: Diagnosis not present

## 2014-11-11 DIAGNOSIS — M79606 Pain in leg, unspecified: Secondary | ICD-10-CM | POA: Diagnosis not present

## 2014-11-11 DIAGNOSIS — M7989 Other specified soft tissue disorders: Secondary | ICD-10-CM | POA: Diagnosis not present

## 2014-11-11 DIAGNOSIS — Z79899 Other long term (current) drug therapy: Secondary | ICD-10-CM | POA: Diagnosis not present

## 2014-11-11 DIAGNOSIS — R030 Elevated blood-pressure reading, without diagnosis of hypertension: Secondary | ICD-10-CM | POA: Diagnosis not present

## 2014-11-11 DIAGNOSIS — M79604 Pain in right leg: Secondary | ICD-10-CM | POA: Diagnosis not present

## 2014-11-11 DIAGNOSIS — R600 Localized edema: Secondary | ICD-10-CM | POA: Diagnosis not present

## 2014-11-11 DIAGNOSIS — Z7952 Long term (current) use of systemic steroids: Secondary | ICD-10-CM | POA: Diagnosis not present

## 2014-11-11 DIAGNOSIS — Z79891 Long term (current) use of opiate analgesic: Secondary | ICD-10-CM | POA: Diagnosis not present

## 2014-11-12 DIAGNOSIS — R600 Localized edema: Secondary | ICD-10-CM | POA: Diagnosis not present

## 2014-11-12 DIAGNOSIS — R531 Weakness: Secondary | ICD-10-CM | POA: Diagnosis not present

## 2014-11-12 DIAGNOSIS — M79605 Pain in left leg: Secondary | ICD-10-CM | POA: Diagnosis not present

## 2014-11-12 DIAGNOSIS — M79604 Pain in right leg: Secondary | ICD-10-CM | POA: Diagnosis not present

## 2014-11-12 DIAGNOSIS — M7989 Other specified soft tissue disorders: Secondary | ICD-10-CM | POA: Diagnosis not present

## 2014-11-12 LAB — URINALYSIS, COMPLETE
BILIRUBIN, UR: NEGATIVE
Bacteria: NONE SEEN
Blood: NEGATIVE
GLUCOSE, UR: NEGATIVE mg/dL (ref 0–75)
Leukocyte Esterase: NEGATIVE
Nitrite: NEGATIVE
Ph: 7 (ref 4.5–8.0)
Protein: NEGATIVE
Specific Gravity: 1.017 (ref 1.003–1.030)
WBC UR: 13 /HPF (ref 0–5)

## 2014-11-12 LAB — TROPONIN I: Troponin-I: <0.02 ng/mL

## 2014-11-12 LAB — CBC
HCT: 41.1 % (ref 35.0–47.0)
HGB: 13.6 g/dL (ref 12.0–16.0)
MCH: 30.4 pg (ref 26.0–34.0)
MCHC: 33.2 g/dL (ref 32.0–36.0)
MCV: 91 fL (ref 80–100)
PLATELETS: 252 10*3/uL (ref 150–440)
RBC: 4.49 10*6/uL (ref 3.80–5.20)
RDW: 13.7 % (ref 11.5–14.5)
WBC: 6.9 10*3/uL (ref 3.6–11.0)

## 2014-11-12 LAB — COMPREHENSIVE METABOLIC PANEL
ALT: 10 U/L — AB
Albumin: 3.3 g/dL — ABNORMAL LOW (ref 3.4–5.0)
Alkaline Phosphatase: 81 U/L
Anion Gap: 6 — ABNORMAL LOW (ref 7–16)
BUN: 10 mg/dL (ref 7–18)
Bilirubin,Total: 0.4 mg/dL (ref 0.2–1.0)
CHLORIDE: 105 mmol/L (ref 98–107)
Calcium, Total: 8.8 mg/dL (ref 8.5–10.1)
Co2: 28 mmol/L (ref 21–32)
Creatinine: 0.73 mg/dL (ref 0.60–1.30)
EGFR (African American): >60
EGFR (Non-African Amer.): >60
Glucose: 174 mg/dL — ABNORMAL HIGH (ref 65–99)
OSMOLALITY: 281 (ref 275–301)
POTASSIUM: 3.8 mmol/L (ref 3.5–5.1)
SGOT(AST): 22 U/L (ref 15–37)
SODIUM: 139 mmol/L (ref 136–145)
TOTAL PROTEIN: 6.9 g/dL (ref 6.4–8.2)

## 2014-11-12 LAB — PRO B NATRIURETIC PEPTIDE: B-Type Natriuretic Peptide: 68 pg/mL (ref 0–450)

## 2014-11-13 LAB — URINE CULTURE

## 2014-11-16 DIAGNOSIS — Z9181 History of falling: Secondary | ICD-10-CM | POA: Diagnosis not present

## 2014-11-16 DIAGNOSIS — G20 Parkinson's disease: Secondary | ICD-10-CM | POA: Diagnosis not present

## 2014-11-16 DIAGNOSIS — E119 Type 2 diabetes mellitus without complications: Secondary | ICD-10-CM | POA: Diagnosis not present

## 2014-11-16 DIAGNOSIS — M25561 Pain in right knee: Secondary | ICD-10-CM | POA: Diagnosis not present

## 2014-11-21 ENCOUNTER — Other Ambulatory Visit: Payer: Self-pay | Admitting: *Deleted

## 2014-11-21 MED ORDER — HYDROCODONE-ACETAMINOPHEN 5-325 MG PO TABS: ORAL_TABLET | ORAL | Status: DC

## 2014-11-21 NOTE — Telephone Encounter (Signed)
[facility] assisted [facility]

## 2014-11-22 DIAGNOSIS — M170 Bilateral primary osteoarthritis of knee: Secondary | ICD-10-CM | POA: Diagnosis not present

## 2014-11-22 DIAGNOSIS — M25562 Pain in left knee: Secondary | ICD-10-CM | POA: Diagnosis not present

## 2014-11-22 DIAGNOSIS — M25561 Pain in right knee: Secondary | ICD-10-CM | POA: Diagnosis not present

## 2014-11-24 DIAGNOSIS — Z9181 History of falling: Secondary | ICD-10-CM | POA: Diagnosis not present

## 2014-11-24 DIAGNOSIS — G20 Parkinson's disease: Secondary | ICD-10-CM | POA: Diagnosis not present

## 2014-11-24 DIAGNOSIS — M25561 Pain in right knee: Secondary | ICD-10-CM | POA: Diagnosis not present

## 2014-11-24 DIAGNOSIS — E119 Type 2 diabetes mellitus without complications: Secondary | ICD-10-CM | POA: Diagnosis not present

## 2014-11-28 ENCOUNTER — Other Ambulatory Visit: Payer: Self-pay

## 2014-11-28 MED ORDER — LINAGLIPTIN 5 MG PO TABS: 5.0000 mg | ORAL_TABLET | Freq: Every day | ORAL | Status: DC

## 2014-11-28 NOTE — Telephone Encounter (Signed)
Rx sent electronically, patient with pending appointment March 2016

## 2014-12-01 DIAGNOSIS — E119 Type 2 diabetes mellitus without complications: Secondary | ICD-10-CM | POA: Diagnosis not present

## 2014-12-01 DIAGNOSIS — G20 Parkinson's disease: Secondary | ICD-10-CM | POA: Diagnosis not present

## 2014-12-01 DIAGNOSIS — Z9181 History of falling: Secondary | ICD-10-CM | POA: Diagnosis not present

## 2014-12-01 DIAGNOSIS — M25561 Pain in right knee: Secondary | ICD-10-CM | POA: Diagnosis not present

## 2014-12-06 DIAGNOSIS — E119 Type 2 diabetes mellitus without complications: Secondary | ICD-10-CM | POA: Diagnosis not present

## 2014-12-06 DIAGNOSIS — Z9181 History of falling: Secondary | ICD-10-CM | POA: Diagnosis not present

## 2014-12-06 DIAGNOSIS — G20 Parkinson's disease: Secondary | ICD-10-CM | POA: Diagnosis not present

## 2014-12-06 DIAGNOSIS — M25561 Pain in right knee: Secondary | ICD-10-CM | POA: Diagnosis not present

## 2014-12-07 ENCOUNTER — Telehealth: Payer: Self-pay | Admitting: *Deleted

## 2014-12-07 NOTE — Telephone Encounter (Signed)
[phone] stating that they will no longer cover Tradjenta 5mg . Given to Dr. Renato Gailseed to review. Member ID: [id number]

## 2014-12-08 ENCOUNTER — Ambulatory Visit (INDEPENDENT_AMBULATORY_CARE_PROVIDER_SITE_OTHER): Payer: Medicare Other | Admitting: Neurology

## 2014-12-08 ENCOUNTER — Encounter: Payer: Self-pay | Admitting: Neurology

## 2014-12-08 VITALS — BP 135/81 | HR 74 | Temp 97.9°F | Ht 63.3 in | Wt 157.0 lb

## 2014-12-08 DIAGNOSIS — Z9181 History of falling: Secondary | ICD-10-CM | POA: Diagnosis not present

## 2014-12-08 DIAGNOSIS — R269 Unspecified abnormalities of gait and mobility: Secondary | ICD-10-CM | POA: Diagnosis not present

## 2014-12-08 DIAGNOSIS — G20 Parkinson's disease: Secondary | ICD-10-CM

## 2014-12-08 DIAGNOSIS — Z8781 Personal history of (healed) traumatic fracture: Secondary | ICD-10-CM

## 2014-12-08 DIAGNOSIS — R296 Repeated falls: Secondary | ICD-10-CM

## 2014-12-08 DIAGNOSIS — M25562 Pain in left knee: Secondary | ICD-10-CM

## 2014-12-08 DIAGNOSIS — M25561 Pain in right knee: Secondary | ICD-10-CM | POA: Diagnosis not present

## 2014-12-08 MED ORDER — CARBIDOPA-LEVODOPA 25-100 MG PO TABS: 1.0000 | ORAL_TABLET | Freq: Three times a day (TID) | ORAL | Status: DC

## 2014-12-08 NOTE — Progress Notes (Signed)
Subjective:    Patient ID: Kara Moore is a 80 y.o. female.  HPI     Interim history:   Kara Moore is a very pleasant 80 year old right-handed woman with an underlying medical history of diabetes, hyperlipidemia, hypothyroidism, vitamin D deficiency, reflux disease, and hypertension and osteoporosis who presents for followup consultation of her gait disorder and parkinsonism, probable L sided predominant PD, complicated by recurrent falls. She is accompanied by her niece, Kara Moore, again today. I last saw her on 06/10/2014, at which time Kara Moore reported that they did not fill the order for the walker. The patient had a steroid injection into the right knee which helped marginally. She was reporting more knee pain on the left. She had an x-ray of her right knee. I asked her to use a rolling walker at all times for gait safety. We talked about her gait disorder and the complexity of her situation. I continued low-dose Sinemet.  Today, her niece reports, she has fallen a few times. She is drooling more. She had severe knee pain and swelling. She had to be taken to the emergency room. She saw orthopedics and received injections into both knees which improved her pain significantly. She may be a candidate for Synvisc injections. She has had some therapy at [facility]. She's not drinking enough water. She has been using a 2 wheeled walker more consistently. She went to [facility] and had x-rays of her knees and ultrasound of her legs which did not show any clots per Kara Moore.  I saw her on 01/18/2014, at which time we talked about her using her walker at all times. She was on Remeron for sleep. I prescribed a rolling walker with seat. She indicated that she would not want to use a walker.   I saw her on 09/09/2013, at which time I felt that her gait disorder with recurrent falls was out of proportion to her parkinsonism. I felt that her gait disorder was a function of advancing age,  previous pelvic fracture, and arthritis. She had finished inpatient rehabilitation at the time. We talked about her recent additional testing including head CT, EEG, carotid Doppler study, cardiac monitor, all of which were unrevealing. She was advised to continue using her 2 wheeled walker at all times.   I saw her on 06/17/2013 after a recent fall and suggested further workup for a recent episode of confusion and her recurrent falls. I ordered CTH, EEG, C. Doppler, cardiac monitor and suggested a referral to geriatrics. She has since then started seeing Kara Moore.   Her head CT from 06/22/2013 was negative for any acute findings.   Her EEG from 06/25/2013 was normal in the awake and sleep states.   Her carotid Doppler study from 06/25/2013 was negative for any significant stenoses.   Her cardiac event monitor from 07/01/2013 through 07/30/2013 was consistent with normal sinus rhythm with occasional PVCs, no evidence of atrial fibrillation. This was interpreted by Kara Moore.   She had an Echocardiogram on 04/15/13 at the [facility] in [place]. She has arthritis, in her R knee and had 2 injections before.   She lives at [facility] in [place]. She feels like "I have legs again". She is tolerating the C/L 25/100 mg 1/2 tid.   I first met her on 03/26/2013 at which time her niece and the patient reported a approximately two-month history of decrease in fine motor skills, decrease in mobility, gait changes and recurrent falls. She reported becoming  slower than usual taking longer to dress herself, needing assistance with some of her ADLs. She was started on C/L at the time. She has had urinary urgency. She recently had physical therapy and her therapist noted lack of ability, inability to dress herself and drooling. The patient had to give up driving.   She had a MRI brain on 04/08/13: Abnormal MRI scan of the brain showing mild changes of age-appropriate chronic microvascular ischemia  and generalized cerebral atrophy.   On 02/11/13 she fell out of her bed and had difficulty getting back up. Her nephew was visiting from [place], but he could not hear her, as he is hard of hearing and she was calling in a soft voice. She developed rhabdomyolysis. In the latter part of April she was taken to [facility] in [place], then to the ER for suspected cellulitis. She had an US of the LEs and did not have a blood clot. She was given lasix for swelling. The next day she fell, and then again, and then again the next day. After these falls, she started having care 24/7 through [facility]. She has been in PT since the end of April. She has been using a 2 wheeled walker since then.   She has a strong FHx of tremors on her mother's side (including M, MGM and cousins). Both of her siblings passed away.   She never had a TIA or a stroke.   She fell at home on 04/30/13, while coming out of the bathroom. She has been having issues with constipation and had taken Dulcolax suppositories, but also took Mag citrate and her niece found her on the floor and she had soiled herself and for some reason had not pushed her Lifeline button. She denied hitting her head or LOC. Her niece called 57 and she was taken to [facility] to the ER and was discharged to home from there. Then she was admitted to inpt rehab at [facility] center on 05/02/13. She fell while in rehab and was found to have a pelvic Fx b/l, reportedly old. She has had some confusion intermittently.    Her Past Medical History Is Significant For: Past Medical History  Diagnosis Date  . Benign essential hypertension   . Diabetes mellitus without complication   . Hyperlipidemia LDL goal < 100   . Hypothyroidism   . Memory loss   . Confusion   . Senile osteoporosis   . Macular degeneration     Her Past Surgical History Is Significant For: Past Surgical History  Procedure Laterality Date  . Adenoidectomy  2011    Her Family History Is  Significant For: Family History  Problem Relation Age of Onset  . Tremor Maternal Grandmother   . Diabetes Paternal Grandfather   . Heart attack Father   . Cancer Sister   . Heart attack Brother   . Diabetes Brother   . Tremor Maternal Aunt   . Diabetes Paternal Grandmother     Her Social History Is Significant For: History   Social History  . Marital Status: Single    Spouse Name: N/A    Number of Children: N/A  . Years of Education: N/A   Social History Main Topics  . Smoking status: Never Smoker   . Smokeless tobacco: None  . Alcohol Use: No  . Drug Use: No  . Sexual Activity: None   Other Topics Concern  . None   Social History Narrative    Her Allergies Are:  No Known Allergies:   Her Current Medications Are:  Outpatient Encounter Prescriptions as of 12/08/2014  Medication Sig  . acetaminophen (TYLENOL) 500 MG tablet Take 500 mg by mouth. (STANDING ORDER) take 1 tablet evert 4 hours as need for fever up to 101.  headach and or minor discomfort.  ( don not exceed 2000 mg)  **MAX DOSE IS 3,000/24 HRS FROM ALL SOURCES88  . alum & mag hydroxide-simeth (MAALOX PLUS) 400-400-40 MG/5ML suspension Take by mouth every 6 (six) hours as needed for indigestion. Take 30 ML up to four times a day as needed  . aluminum-magnesium hydroxide 200-200 MG/5ML suspension Take 5 mLs by mouth. (standing order) 30 cc (2 TBSO) orally up to 4 times a day as needed for heartburn or indigestion ** not to exceed 4 doses in 24 hours**  . aspirin 81 MG chewable tablet Chew 81 mg by mouth daily.   . Calcium Carb-Cholecalciferol 500-200 MG-UNIT TABS Take by mouth daily.  . calcium-vitamin D (OSCAL WITH D) 500-200 MG-UNIT per tablet Take 1 tablet by mouth daily.  . carbidopa-levodopa (SINEMET IR) 25-100 MG per tablet Take 0.5 tablets by mouth 3 (three) times daily. Follow titration instructions provided. For Parkinson's Disease  . Cholecalciferol 2000 UNITS CAPS Take 1 capsule (2,000 Units total) by  mouth daily.  . fluticasone (FLONASE) 50 MCG/ACT nasal spray Place 2 sprays into the nose as needed.   . furosemide (LASIX) 20 MG tablet Take 1/2 tablet orally every day  . glipiZIDE (GLUCOTROL) 5 MG tablet Take 5 mg by mouth daily before breakfast. For Diabetes (elevated blood sugar)  . guaiFENesin (ROBITUSSIN) 100 MG/5ML liquid Take 100 mg by mouth. (Standing Order) 2 tsp ( 10cc) orally evry six hours as need for co;ugh**NOT TO EXCEED 4 DOSES  . HYDROcodone-acetaminophen (NORCO) 5-325 MG per tablet Take one tablet by mouth every 6 hours as needed for severe pain. Do not exceed 3g of APAP per day  . hydrocortisone (ANUSOL-HC) 2.5 % rectal cream Place 1 application rectally as needed for hemorrhoids or itching.  . levothyroxine (SYNTHROID, LEVOTHROID) 50 MCG tablet TAKE 1 TABLET BY MOUTH DAILY BEFORE BREAKFAST ON EMPTY STOMACH *BINGO CARD*  . linagliptin (TRADJENTA) 5 MG TABS tablet Take 1 tablet (5 mg total) by mouth daily. For diabetes  . loperamide (IMODIUM) 2 MG capsule Take 2 mg by mouth. Take one tablet orally with each loose stool as need for diarrhea * NTE 8 doses in 24 hours*  . magnesium hydroxide (MILK OF MAGNESIA) 400 MG/5ML suspension Take by mouth daily as needed for constipation.  . mirtazapine (REMERON) 15 MG tablet Take 15 mg by mouth at bedtime.  . Multiple Vitamins-Minerals (ICAPS MV) TABS Take 2 tablets by mouth. Take 2 capsules daily  . neomycin-bacitracin-polymyxin (NEOSPORIN) ointment Apply 1 application topically as needed for wound care. apply to eye  . potassium chloride (K-DUR,KLOR-CON) 10 MEQ tablet Take 10 mEq by mouth daily. Take to prevent potassium loss (due to taking fluid pill)  . pravastatin (PRAVACHOL) 40 MG tablet Take 1 tablet (40 mg total) by mouth daily. For Cholesterol  . ranitidine (ZANTAC) 150 MG capsule Take 150 mg by mouth 2 (two) times daily.   . traMADol (ULTRAM) 50 MG tablet Take 1 tablet (50 mg total) by mouth 2 (two) times daily.  . traZODone  (DESYREL) 50 MG tablet Take 50 mg by mouth at bedtime as needed.   :  Review of Systems:  Out of a complete 14 point review of systems, all  are reviewed and negative with the exception of these symptoms as listed below:   Review of Systems  Neurological:       Knee problems, saw Orthopedic doctor    Objective:  Neurologic Exam  Physical Exam Physical Examination:   Filed Vitals:   12/08/14 1446  BP: 135/81  Pulse: 74  Temp: 97.9 F (36.6 C)    General Examination: The patient is a very pleasant 80 y.o. female in no acute distress. She is calm and cooperative with the exam. She denies Auditory Hallucinations and Visual Hallucinations.   HEENT: Normocephalic, atraumatic, pupils are equal, round and reactive to light and accommodation. Extraocular tracking shows moderate saccadic breakdown without nystagmus noted. Hearing is intact. Face is symmetric with moderate facial masking and normal facial sensation. Excess seborrhoic changes are seen on her face. There is no lip, neck or jaw tremor. Neck is moderately rigid with intact passive ROM. There are no carotid bruits on auscultation. Oropharynx exam reveals mild mouth dryness. No significant airway crowding is noted. Mallampati is class II. Tongue protrudes centrally and palate elevates symmetrically. Mild to moderate drooling is noted.  Chest: is clear to auscultation without wheezing, rhonchi or crackles noted.  Heart: sounds are regular and normal with a 1/6 early systolic murmur, no rubs or gallops noted, unchanged.   Abdomen: is soft, non-tender and non-distended with normal bowel sounds appreciated on auscultation.  Extremities: There is trace edema in the lower extremities bilaterally, from the knee on down. She is wearing compression stockings up to the knees.   Skin: is warm and dry with no trophic changes noted.   Musculoskeletal: exam reveals no obvious joint deformities, tenderness or joint swelling or erythema, with  the exception of L knee swelling.  Neurologically:  Mental status: The patient is awake and alert, paying good attention. She is able to mostly provide the history. Her nieces provide some details. She is oriented to: person, place, time/date, situation, day of week, month of year and year. Her memory, attention, language and knowledge are impaired, mildly. There is no aphasia, agnosia, apraxia or anomia. There is a mild degree of bradyphrenia. Speech is mildly hypophonic with no dysarthria noted. Mood is congruent and affect is normal.   Her MMSE score was 29/30. CDT 4/4. AFT (Animal Fluency Test) score was 14 - in 5/14. On 12/08/2014: MMSE:27/30, CDT: 1/4, AFT: 18/min.  Cranial nerves are as described above under HEENT exam. In addition, shoulder shrug is normal with equal shoulder height noted.  Motor exam: Normal bulk, and strength for age is noted. Tone is mildly rigid with absence of cogwheeling. There is overall moderate bradykinesia. There is no drift or rebound. There is a mild bilateral upper extremity resting tremor, and no significant lower extremity tremor today.    Romberg is negative. Reflexes are 1+ in the upper extremities and 1+ in the lower extremities.Fine motor skills: Finger taps, hand movements, and rapid alternating patting are moderately impaired on the L and mild to moderately impaired on the R. Foot taps and foot agility are moderately impaired bilaterally, L worse than R.    Cerebellar testing shows no dysmetria or intention tremor on finger to nose testing. There is no truncal or gait ataxia.   Sensory exam is intact to light touch, PP and temperature sense in the UEs and LEs.   Gait, station and balance: she stands with mild difficulty and pushes herself up and takes about 5 attempts. She has a moderately stooped posture. She walks  slowly and insecurely and has some instability when turning. She uses her 2 wheeled walker today. Her balance is mild to moderately impaired.    Assessment and Plan:   In summary, HADLEI STITT is a very pleasant 80 year old female with a history of slowness, stiffness, recurrent falls, problems with fine motor skills, her posture, as well as drooling. She has evidence of left-sided predominant Parkinson's disease vs parkinsonism, and a significant gait disorder with recurrent falls with injuries including pelvic fracture in the past. Her gait disorder is likely a function of multiple additional issues including advancing age, previous pelvic fracture, arthritis, especially affecting her knees. She is in assisted living, [place] and has had some in-house physical therapy. Unfortunately, she has fallen a couple of times. She is however using her 2 wheeled walker consistently at this time. Her Parkinson's signs and symptoms have progressed a little and I would like for her to increase her Sinemet to one whole pill 3 times a day.  She is at risk for injury because of advanced age, history of osteoporosis and vitamin D deficiency, diabetes, advanced age and prior injuries. I  asked her to increase her water intake, and use her walker at all times. She has had some improvement of her arthritic pain with steroid injections to both knees. Lately her hemoglobin A1c was 8.1 in December and Kara Moore has started her on Tradjenta.  In the past, she had testing including CTH, EEG, C. Doppler, cardiac monitor, all of which were unrevealing. I would like to see her back in about 4 months from now, sooner if the need arises.

## 2014-12-08 NOTE — Patient Instructions (Addendum)
We will try to increase your Sinemet to 1 pill 3 times a day to see if we can improve your mobility.  Drink more water.  Use your walker at all times.  I will see you back in 4 months.

## 2014-12-12 DIAGNOSIS — M25561 Pain in right knee: Secondary | ICD-10-CM | POA: Diagnosis not present

## 2014-12-12 DIAGNOSIS — G20 Parkinson's disease: Secondary | ICD-10-CM | POA: Diagnosis not present

## 2014-12-12 DIAGNOSIS — Z9181 History of falling: Secondary | ICD-10-CM | POA: Diagnosis not present

## 2014-12-12 DIAGNOSIS — E119 Type 2 diabetes mellitus without complications: Secondary | ICD-10-CM | POA: Diagnosis not present

## 2014-12-13 ENCOUNTER — Other Ambulatory Visit: Payer: Self-pay | Admitting: *Deleted

## 2014-12-13 MED ORDER — PRAVASTATIN SODIUM 40 MG PO TABS: 40.0000 mg | ORAL_TABLET | Freq: Every day | ORAL | Status: DC

## 2014-12-13 NOTE — Telephone Encounter (Signed)
Fax order from [facility]

## 2014-12-15 DIAGNOSIS — G20 Parkinson's disease: Secondary | ICD-10-CM | POA: Diagnosis not present

## 2014-12-15 DIAGNOSIS — Z9181 History of falling: Secondary | ICD-10-CM | POA: Diagnosis not present

## 2014-12-15 DIAGNOSIS — E119 Type 2 diabetes mellitus without complications: Secondary | ICD-10-CM | POA: Diagnosis not present

## 2014-12-15 DIAGNOSIS — M25561 Pain in right knee: Secondary | ICD-10-CM | POA: Diagnosis not present

## 2014-12-19 ENCOUNTER — Telehealth: Payer: Self-pay

## 2014-12-19 NOTE — Telephone Encounter (Signed)
Called [employer]: per Dr. Renato Gailseed, past has DM II with chronic kidney disease and cannot take Onglyza at optimal dose or Januvia or Metformni. They will do medical review will fax, results.

## 2014-12-20 NOTE — Telephone Encounter (Signed)
Received fax from [facility] # [phone]. Tradjenta Approved through 11/18/2015. Patient Notified. Member ID: [id number]

## 2014-12-21 DIAGNOSIS — E119 Type 2 diabetes mellitus without complications: Secondary | ICD-10-CM | POA: Diagnosis not present

## 2014-12-21 DIAGNOSIS — M170 Bilateral primary osteoarthritis of knee: Secondary | ICD-10-CM | POA: Diagnosis not present

## 2014-12-21 DIAGNOSIS — Z9181 History of falling: Secondary | ICD-10-CM | POA: Diagnosis not present

## 2014-12-21 DIAGNOSIS — M25561 Pain in right knee: Secondary | ICD-10-CM | POA: Diagnosis not present

## 2014-12-21 DIAGNOSIS — G20 Parkinson's disease: Secondary | ICD-10-CM | POA: Diagnosis not present

## 2014-12-22 DIAGNOSIS — Z79899 Other long term (current) drug therapy: Secondary | ICD-10-CM | POA: Diagnosis not present

## 2014-12-26 ENCOUNTER — Telehealth: Payer: Self-pay | Admitting: *Deleted

## 2014-12-26 DIAGNOSIS — E119 Type 2 diabetes mellitus without complications: Secondary | ICD-10-CM | POA: Diagnosis not present

## 2014-12-26 DIAGNOSIS — G20 Parkinson's disease: Secondary | ICD-10-CM | POA: Diagnosis not present

## 2014-12-26 DIAGNOSIS — Z9181 History of falling: Secondary | ICD-10-CM | POA: Diagnosis not present

## 2014-12-26 DIAGNOSIS — M25561 Pain in right knee: Secondary | ICD-10-CM | POA: Diagnosis not present

## 2014-12-26 NOTE — Telephone Encounter (Signed)
Received UA Results that were ordered by [facility] and per Dr. Eldred Mangeseed--Inadequate growth for UTI-Patient does not have catheter. Push Fluids by mouth only. Kara Moore, Niece Notified and faxed results to [facility] at Fax: [phone] # [phone]

## 2014-12-27 DIAGNOSIS — M25561 Pain in right knee: Secondary | ICD-10-CM | POA: Diagnosis not present

## 2014-12-27 DIAGNOSIS — G20 Parkinson's disease: Secondary | ICD-10-CM | POA: Diagnosis not present

## 2014-12-27 DIAGNOSIS — Z9181 History of falling: Secondary | ICD-10-CM | POA: Diagnosis not present

## 2014-12-27 DIAGNOSIS — E119 Type 2 diabetes mellitus without complications: Secondary | ICD-10-CM | POA: Diagnosis not present

## 2014-12-28 DIAGNOSIS — H0015 Chalazion left lower eyelid: Secondary | ICD-10-CM | POA: Diagnosis not present

## 2014-12-29 DIAGNOSIS — G20 Parkinson's disease: Secondary | ICD-10-CM | POA: Diagnosis not present

## 2014-12-29 DIAGNOSIS — E119 Type 2 diabetes mellitus without complications: Secondary | ICD-10-CM | POA: Diagnosis not present

## 2014-12-29 DIAGNOSIS — M25561 Pain in right knee: Secondary | ICD-10-CM | POA: Diagnosis not present

## 2014-12-29 DIAGNOSIS — Z9181 History of falling: Secondary | ICD-10-CM | POA: Diagnosis not present

## 2015-01-03 DIAGNOSIS — G20 Parkinson's disease: Secondary | ICD-10-CM | POA: Diagnosis not present

## 2015-01-03 DIAGNOSIS — Z9181 History of falling: Secondary | ICD-10-CM | POA: Diagnosis not present

## 2015-01-03 DIAGNOSIS — E119 Type 2 diabetes mellitus without complications: Secondary | ICD-10-CM | POA: Diagnosis not present

## 2015-01-03 DIAGNOSIS — M25561 Pain in right knee: Secondary | ICD-10-CM | POA: Diagnosis not present

## 2015-01-09 DIAGNOSIS — E119 Type 2 diabetes mellitus without complications: Secondary | ICD-10-CM | POA: Diagnosis not present

## 2015-01-09 DIAGNOSIS — M25561 Pain in right knee: Secondary | ICD-10-CM | POA: Diagnosis not present

## 2015-01-09 DIAGNOSIS — Z9181 History of falling: Secondary | ICD-10-CM | POA: Diagnosis not present

## 2015-01-09 DIAGNOSIS — G20 Parkinson's disease: Secondary | ICD-10-CM | POA: Diagnosis not present

## 2015-01-12 DIAGNOSIS — G20 Parkinson's disease: Secondary | ICD-10-CM | POA: Diagnosis not present

## 2015-01-12 DIAGNOSIS — E119 Type 2 diabetes mellitus without complications: Secondary | ICD-10-CM | POA: Diagnosis not present

## 2015-01-12 DIAGNOSIS — Z9181 History of falling: Secondary | ICD-10-CM | POA: Diagnosis not present

## 2015-01-12 DIAGNOSIS — M25561 Pain in right knee: Secondary | ICD-10-CM | POA: Diagnosis not present

## 2015-01-16 DIAGNOSIS — G20 Parkinson's disease: Secondary | ICD-10-CM | POA: Diagnosis not present

## 2015-01-16 DIAGNOSIS — Z9181 History of falling: Secondary | ICD-10-CM | POA: Diagnosis not present

## 2015-01-16 DIAGNOSIS — E119 Type 2 diabetes mellitus without complications: Secondary | ICD-10-CM | POA: Diagnosis not present

## 2015-01-16 DIAGNOSIS — M25561 Pain in right knee: Secondary | ICD-10-CM | POA: Diagnosis not present

## 2015-01-20 DIAGNOSIS — Z9181 History of falling: Secondary | ICD-10-CM | POA: Diagnosis not present

## 2015-01-20 DIAGNOSIS — M25561 Pain in right knee: Secondary | ICD-10-CM | POA: Diagnosis not present

## 2015-01-20 DIAGNOSIS — G20 Parkinson's disease: Secondary | ICD-10-CM | POA: Diagnosis not present

## 2015-01-20 DIAGNOSIS — E119 Type 2 diabetes mellitus without complications: Secondary | ICD-10-CM | POA: Diagnosis not present

## 2015-01-23 DIAGNOSIS — G20 Parkinson's disease: Secondary | ICD-10-CM | POA: Diagnosis not present

## 2015-01-23 DIAGNOSIS — E119 Type 2 diabetes mellitus without complications: Secondary | ICD-10-CM | POA: Diagnosis not present

## 2015-01-23 DIAGNOSIS — M25561 Pain in right knee: Secondary | ICD-10-CM | POA: Diagnosis not present

## 2015-01-23 DIAGNOSIS — Z9181 History of falling: Secondary | ICD-10-CM | POA: Diagnosis not present

## 2015-01-24 ENCOUNTER — Other Ambulatory Visit: Payer: Medicare Other

## 2015-01-24 DIAGNOSIS — I10 Essential (primary) hypertension: Secondary | ICD-10-CM | POA: Diagnosis not present

## 2015-01-24 DIAGNOSIS — I872 Venous insufficiency (chronic) (peripheral): Secondary | ICD-10-CM | POA: Diagnosis not present

## 2015-01-24 DIAGNOSIS — E119 Type 2 diabetes mellitus without complications: Secondary | ICD-10-CM | POA: Diagnosis not present

## 2015-01-24 DIAGNOSIS — E039 Hypothyroidism, unspecified: Secondary | ICD-10-CM

## 2015-01-25 DIAGNOSIS — M25561 Pain in right knee: Secondary | ICD-10-CM | POA: Diagnosis not present

## 2015-01-25 DIAGNOSIS — Z9181 History of falling: Secondary | ICD-10-CM | POA: Diagnosis not present

## 2015-01-25 DIAGNOSIS — G20 Parkinson's disease: Secondary | ICD-10-CM | POA: Diagnosis not present

## 2015-01-25 DIAGNOSIS — E119 Type 2 diabetes mellitus without complications: Secondary | ICD-10-CM | POA: Diagnosis not present

## 2015-01-25 LAB — CBC WITH DIFFERENTIAL
Basophils Absolute: 0 10*3/uL (ref 0.0–0.2)
Basos: 0 %
Eos: 2 %
Eosinophils Absolute: 0.1 10*3/uL (ref 0.0–0.4)
HCT: 44.9 % (ref 34.0–46.6)
Hemoglobin: 14.6 g/dL (ref 11.1–15.9)
Immature Grans (Abs): 0 10*3/uL (ref 0.0–0.1)
Immature Granulocytes: 0 %
Lymphocytes Absolute: 1.3 10*3/uL (ref 0.7–3.1)
Lymphs: 19 %
MCH: 29.8 pg (ref 26.6–33.0)
MCHC: 32.5 g/dL (ref 31.5–35.7)
MCV: 92 fL (ref 79–97)
Monocytes Absolute: 0.5 10*3/uL (ref 0.1–0.9)
Monocytes: 7 %
Neutrophils Absolute: 4.9 10*3/uL (ref 1.4–7.0)
Neutrophils Relative %: 72 %
RBC: 4.9 x10E6/uL (ref 3.77–5.28)
RDW: 14.2 % (ref 12.3–15.4)
WBC: 6.9 10*3/uL (ref 3.4–10.8)

## 2015-01-25 LAB — BASIC METABOLIC PANEL
BUN/Creatinine Ratio: 13 (ref 11–26)
BUN: 10 mg/dL (ref 8–27)
CO2: 23 mmol/L (ref 18–29)
Calcium: 9.3 mg/dL (ref 8.7–10.3)
Chloride: 98 mmol/L (ref 97–108)
Creatinine, Ser: 0.8 mg/dL (ref 0.57–1.00)
GFR calc Af Amer: 82 mL/min/{1.73_m2} (ref >59)
GFR calc non Af Amer: 71 mL/min/{1.73_m2} (ref >59)
Glucose: 176 mg/dL — ABNORMAL HIGH (ref 65–99)
Potassium: 4.6 mmol/L (ref 3.5–5.2)
Sodium: 138 mmol/L (ref 134–144)

## 2015-01-25 LAB — HEMOGLOBIN A1C
Est. average glucose Bld gHb Est-mCnc: 203 mg/dL
Hgb A1c MFr Bld: 8.7 % — ABNORMAL HIGH (ref 4.8–5.6)

## 2015-01-25 LAB — TSH: TSH: 3.02 u[IU]/mL (ref 0.450–4.500)

## 2015-01-30 ENCOUNTER — Encounter: Payer: Self-pay | Admitting: Internal Medicine

## 2015-01-30 ENCOUNTER — Ambulatory Visit (INDEPENDENT_AMBULATORY_CARE_PROVIDER_SITE_OTHER): Payer: Medicare Other | Admitting: Internal Medicine

## 2015-01-30 VITALS — BP 132/78 | HR 66 | Temp 97.6°F | Resp 20 | Ht 63.0 in | Wt 159.4 lb

## 2015-01-30 DIAGNOSIS — E039 Hypothyroidism, unspecified: Secondary | ICD-10-CM

## 2015-01-30 DIAGNOSIS — G20 Parkinson's disease: Secondary | ICD-10-CM

## 2015-01-30 DIAGNOSIS — Z1231 Encounter for screening mammogram for malignant neoplasm of breast: Secondary | ICD-10-CM

## 2015-01-30 DIAGNOSIS — I10 Essential (primary) hypertension: Secondary | ICD-10-CM | POA: Diagnosis not present

## 2015-01-30 DIAGNOSIS — M170 Bilateral primary osteoarthritis of knee: Secondary | ICD-10-CM

## 2015-01-30 DIAGNOSIS — E119 Type 2 diabetes mellitus without complications: Secondary | ICD-10-CM | POA: Diagnosis not present

## 2015-01-30 DIAGNOSIS — G20A1 Parkinson's disease without dyskinesia, without mention of fluctuations: Secondary | ICD-10-CM

## 2015-01-30 MED ORDER — METFORMIN HCL 500 MG PO TABS: 500.0000 mg | ORAL_TABLET | Freq: Two times a day (BID) | ORAL | Status: DC

## 2015-01-30 NOTE — Progress Notes (Signed)
Patient ID: Kara Moore, female   DOB: 08/20/1935, 80 y.o.   MRN: [id number]   Location:  [facility]  Code Status: DNR  No Known Allergies  Chief Complaint  Patient presents with  . Medical Management of Chronic Issues    HPI: Patient is a 80 y.o. white female seen in the office today for medical mgt of chronic diseases.    Eye problem.  Has been to eye doctor.  Lower left lid in eyelash had pimple like bump, but very red inside--was put on eye drops for 2 wks.  Improved, but still one very red area.  Had a chalazion.  Has a lid specialist appt next Monday.    Right knee still giving her a lot of problems.  Pain shoots from outside to inside.  Gets bad cramps in legs at night.  Ok if remembers to get pain pill.  In back of knee it stays sore.  Christmas night, legs and knees were bothering her so bad that she couldn't walk--went to ED.  Lasix was increased for several days due to edema.  Pain meds temporarily increased.  Went to orthopedics--Dr. Cranston Neighborhris Gaines on 11/22/14--got cortisone shots in both knees.  If not helpful, could get synvisc shots.  Went back in 4 wks, but felt she did not need the injections.  Is using the walker most all of the time. Dr. Frances FurbishAthar has supported the need to use regularly also.  Had some PT also.  Got the rollator walker from therapy for a week or so.     Has had some falls.  First time missed commode.  Happened a second time so now has potty chair over commode to help stability.  The leg pain got better just so quickly as it got worse.    BP is at goal now.    Was having urgency of urination.  Has improved.  Now using pads and depends to wear.    hba1c was 8.7.  copay is still $185 for tradjenta.    Review of Systems:  Review of Systems  Constitutional: Positive for malaise/fatigue.  HENT: Negative for congestion.   Eyes: Negative for blurred vision.  Respiratory: Negative for cough and shortness of breath.     Cardiovascular: Negative for chest pain and PND.  Gastrointestinal: Negative for abdominal pain.  Genitourinary: Negative for dysuria.  Musculoskeletal: Positive for joint pain and falls.  Skin: Negative for rash.  Neurological: Negative for dizziness.  Psychiatric/Behavioral: Positive for depression and memory loss.       Mood improved this visit     Past Medical History  Diagnosis Date  . Benign essential hypertension   . Diabetes mellitus without complication   . Hyperlipidemia LDL goal < 100   . Hypothyroidism   . Memory loss   . Confusion   . Senile osteoporosis   . Macular degeneration     Past Surgical History  Procedure Laterality Date  . Adenoidectomy  2011    Social History:   reports that she has never smoked. She does not have any smokeless tobacco history on file. She reports that she does not drink alcohol or use illicit drugs.  Family History  Problem Relation Age of Onset  . Tremor Maternal Grandmother   . Diabetes Paternal Grandfather   . Heart attack Father   . Cancer Sister   . Heart attack Brother   . Diabetes Brother   . Tremor Maternal Aunt   .  Diabetes Paternal Grandmother     Medications: Patient's Medications  New Prescriptions   No medications on file  Previous Medications   ACETAMINOPHEN (TYLENOL) 500 MG TABLET    Take 500 mg by mouth. (STANDING ORDER) take 1 tablet evert 4 hours as need for fever up to 101.  headach and or minor discomfort.  ( don not exceed 2000 mg)  **MAX DOSE IS 3,000/24 HRS FROM ALL SOURCES88   ALUM & MAG HYDROXIDE-SIMETH (MAALOX PLUS) 400-400-40 MG/5ML SUSPENSION    Take by mouth every 6 (six) hours as needed for indigestion. Take 30 ML up to four times a day as needed   ALUMINUM-MAGNESIUM HYDROXIDE 200-200 MG/5ML SUSPENSION    Take 5 mLs by mouth. (standing order) 30 cc (2 TBSO) orally up to 4 times a day as needed for heartburn or indigestion ** not to exceed 4 doses in 24 hours**   ASPIRIN 81 MG CHEWABLE TABLET     Chew 81 mg by mouth daily.    CALCIUM CARB-CHOLECALCIFEROL 500-200 MG-UNIT TABS    Take by mouth daily.   CALCIUM-VITAMIN D (OSCAL WITH D) 500-200 MG-UNIT PER TABLET    Take 1 tablet by mouth daily.   CARBIDOPA-LEVODOPA (SINEMET IR) 25-100 MG PER TABLET    Take 1 tablet by mouth 3 (three) times daily. Follow titration instructions provided. For Parkinson's Disease   CHOLECALCIFEROL 2000 UNITS CAPS    Take 1 capsule (2,000 Units total) by mouth daily.   FLUTICASONE (FLONASE) 50 MCG/ACT NASAL SPRAY    Place 2 sprays into the nose as needed.    FUROSEMIDE (LASIX) 20 MG TABLET    Take 1/2 tablet orally every day   GLIPIZIDE (GLUCOTROL) 5 MG TABLET    Take 5 mg by mouth daily before breakfast. For Diabetes (elevated blood sugar)   GUAIFENESIN (ROBITUSSIN) 100 MG/5ML LIQUID    Take 100 mg by mouth. (Standing Order) 2 tsp ( 10cc) orally evry six hours as need for co;ugh**NOT TO EXCEED 4 DOSES   HYDROCODONE-ACETAMINOPHEN (NORCO) 5-325 MG PER TABLET    Take one tablet by mouth every 6 hours as needed for severe pain. Do not exceed 3g of APAP per day   HYDROCORTISONE (ANUSOL-HC) 2.5 % RECTAL CREAM    Place 1 application rectally as needed for hemorrhoids or itching.   LEVOTHYROXINE (SYNTHROID, LEVOTHROID) 50 MCG TABLET    TAKE 1 TABLET BY MOUTH DAILY BEFORE BREAKFAST ON EMPTY STOMACH *BINGO CARD*   LINAGLIPTIN (TRADJENTA) 5 MG TABS TABLET    Take 1 tablet (5 mg total) by mouth daily. For diabetes   LOPERAMIDE (IMODIUM) 2 MG CAPSULE    Take 2 mg by mouth. Take one tablet orally with each loose stool as need for diarrhea * NTE 8 doses in 24 hours*   MAGNESIUM HYDROXIDE (MILK OF MAGNESIA) 400 MG/5ML SUSPENSION    Take by mouth daily as needed for constipation.   MIRTAZAPINE (REMERON) 15 MG TABLET    Take 15 mg by mouth at bedtime.   MULTIPLE VITAMINS-MINERALS (ICAPS MV) TABS    Take 2 tablets by mouth. Take 2 capsules daily   NEOMYCIN-BACITRACIN-POLYMYXIN (NEOSPORIN) OINTMENT    Apply 1 application topically as  needed for wound care. apply to eye   POTASSIUM CHLORIDE (K-DUR,KLOR-CON) 10 MEQ TABLET    Take 10 mEq by mouth daily. Take to prevent potassium loss (due to taking fluid pill)   PRAVASTATIN (PRAVACHOL) 40 MG TABLET    Take 1 tablet (40 mg total) by mouth daily.  For Cholesterol   RANITIDINE (ZANTAC) 150 MG CAPSULE    Take 150 mg by mouth 2 (two) times daily.    TRAMADOL (ULTRAM) 50 MG TABLET    Take 1 tablet (50 mg total) by mouth 2 (two) times daily.   TRAZODONE (DESYREL) 50 MG TABLET    Take 50 mg by mouth at bedtime as needed.   Modified Medications   No medications on file  Discontinued Medications   No medications on file     Physical Exam: Filed Vitals:   01/30/15 1007  BP: 132/78  Pulse: 66  Temp: 97.6 F (36.4 C)  TempSrc: Oral  Resp: 20  Height: 5\' 3"  (1.6 m)  Weight: 159 lb 6.4 oz (72.303 kg)  SpO2: 94%  Physical Exam  Constitutional: She is oriented to person, place, and time. She appears well-developed and well-nourished.  Cardiovascular: Normal rate, regular rhythm, normal heart sounds and intact distal pulses.   Pulmonary/Chest: Effort normal and breath sounds normal.  Abdominal: Bowel sounds are normal.  Musculoskeletal: Normal range of motion. She exhibits tenderness.  Right knee  Neurological: She is alert and oriented to person, place, and time.  Skin: Skin is warm and dry.  Psychiatric:  Flat affect    Labs reviewed: Basic Metabolic Panel:  Recent Labs  16/10/96 1007 10/24/14 0920 01/24/15 0913  NA 137 137 138  K 4.8 4.4 4.6  CL 97 101 98  CO2 21 22 23   GLUCOSE 202* 133* 176*  BUN 11 16 10   CREATININE 0.78 0.79 0.80  CALCIUM 9.7 9.6 9.3  TSH 6.550* 3.230 3.020   Liver Function Tests:  Recent Labs  07/19/14 1007  AST 15  ALT 22  ALKPHOS 105  BILITOT 0.4  PROT 6.8   No results for input(s): LIPASE, AMYLASE in the last 8760 hours. No results for input(s): AMMONIA in the last 8760 hours. CBC:  Recent Labs  07/19/14 1007  01/24/15 0913  WBC 5.7 6.9  NEUTROABS 3.6 4.9  HGB 15.0 14.6  HCT 44.4 44.9  MCV 92 92   Lipid Panel:  Recent Labs  07/19/14 1007 10/24/14 0920  CHOL 203* 169  HDL 67 61  LDLCALC 116* 93  TRIG 98 74  CHOLHDL 3.0 2.8   Lab Results  Component Value Date   HGBA1C 8.7* 01/24/2015   Assessment/Plan 1. Parkinson's disease (tremor, stiffness, slow motion, unstable posture) -cont current sinemet -follows with Dr. Frances Furbish -improved again recently -using her walker more, but still not 100% of the time as recommended  2. Primary osteoarthritis of both knees -right knee more bothersome than left -cannot tolerate narcotics and cannot be on long term nsaids with diabetes and risk of nephropathy  3. Essential hypertension, benign -bp at goal and has h/o orthostasis so avoid excessive reduction  4. Encounter for screening mammogram for breast cancer - MM DIGITAL SCREENING BILATERAL; Future at [facility] placed  5. Hypothyroidism, unspecified hypothyroidism type -cont current synthroid separate from other meds first thing in am - TSH; Future  6. Diabetes mellitus type 2, uncomplicated - no controlled with hba1c over 8 -will add metformin 500mg  po bid with meals to regimen--WITH meals part emphasized in orders to prevent nausea -no lows so continue glipizide - CBC with Differential/Platelet; Future - Hemoglobin A1c; Future - Lipid panel; Future  Labs/tests ordered: Orders Placed This Encounter  Procedures  . MM DIGITAL SCREENING BILATERAL    Standing Status: Future     Number of Occurrences:  Standing Expiration Date: 03/31/2016    Order Specific Question:  Reason for exam:    Answer:  routine screening    Order Specific Question:  Preferred imaging location?    Answer:  [facility]  . CBC with Differential/Platelet    Standing Status: Future     Number of Occurrences:      Standing Expiration Date: 10/02/2015  . Hemoglobin A1c    Standing Status:  Future     Number of Occurrences:      Standing Expiration Date: 10/02/2015  . TSH    Standing Status: Future     Number of Occurrences:      Standing Expiration Date: 10/02/2015  . Lipid panel    Standing Status: Future     Number of Occurrences:      Standing Expiration Date: 10/02/2015    Order Specific Question:  Has the patient fasted?    Answer:  Yes    Next appt:  4 mos with labs before  Enedina Pair L. Marieli Rudy, D.O. Geriatrics [facility] [address], [place] [address] Cell Phone (Mon-Fri 8am-5pm):  [phone] On Call:  [phone] & follow prompts after 5pm & weekends Office Phone:  [phone] Office Fax:  [phone]

## 2015-01-31 DIAGNOSIS — M25561 Pain in right knee: Secondary | ICD-10-CM | POA: Diagnosis not present

## 2015-01-31 DIAGNOSIS — G20 Parkinson's disease: Secondary | ICD-10-CM | POA: Diagnosis not present

## 2015-01-31 DIAGNOSIS — E119 Type 2 diabetes mellitus without complications: Secondary | ICD-10-CM | POA: Diagnosis not present

## 2015-01-31 DIAGNOSIS — Z9181 History of falling: Secondary | ICD-10-CM | POA: Diagnosis not present

## 2015-02-02 ENCOUNTER — Ambulatory Visit: Payer: Self-pay | Admitting: Internal Medicine

## 2015-02-02 DIAGNOSIS — M79661 Pain in right lower leg: Secondary | ICD-10-CM | POA: Diagnosis not present

## 2015-02-02 DIAGNOSIS — G20 Parkinson's disease: Secondary | ICD-10-CM | POA: Diagnosis not present

## 2015-02-02 DIAGNOSIS — R600 Localized edema: Secondary | ICD-10-CM | POA: Diagnosis not present

## 2015-02-02 DIAGNOSIS — Z9181 History of falling: Secondary | ICD-10-CM | POA: Diagnosis not present

## 2015-02-02 DIAGNOSIS — E119 Type 2 diabetes mellitus without complications: Secondary | ICD-10-CM | POA: Diagnosis not present

## 2015-02-02 DIAGNOSIS — M25561 Pain in right knee: Secondary | ICD-10-CM | POA: Diagnosis not present

## 2015-02-06 DIAGNOSIS — H0016 Chalazion left eye, unspecified eyelid: Secondary | ICD-10-CM | POA: Diagnosis not present

## 2015-02-08 DIAGNOSIS — Z9181 History of falling: Secondary | ICD-10-CM | POA: Diagnosis not present

## 2015-02-08 DIAGNOSIS — M25561 Pain in right knee: Secondary | ICD-10-CM | POA: Diagnosis not present

## 2015-02-08 DIAGNOSIS — G20 Parkinson's disease: Secondary | ICD-10-CM | POA: Diagnosis not present

## 2015-02-08 DIAGNOSIS — E119 Type 2 diabetes mellitus without complications: Secondary | ICD-10-CM | POA: Diagnosis not present

## 2015-02-09 DIAGNOSIS — M25561 Pain in right knee: Secondary | ICD-10-CM | POA: Diagnosis not present

## 2015-02-09 DIAGNOSIS — G20 Parkinson's disease: Secondary | ICD-10-CM | POA: Diagnosis not present

## 2015-02-09 DIAGNOSIS — E119 Type 2 diabetes mellitus without complications: Secondary | ICD-10-CM | POA: Diagnosis not present

## 2015-02-09 DIAGNOSIS — Z9181 History of falling: Secondary | ICD-10-CM | POA: Diagnosis not present

## 2015-02-15 DIAGNOSIS — G20 Parkinson's disease: Secondary | ICD-10-CM | POA: Diagnosis not present

## 2015-02-15 DIAGNOSIS — M25561 Pain in right knee: Secondary | ICD-10-CM | POA: Diagnosis not present

## 2015-02-15 DIAGNOSIS — E119 Type 2 diabetes mellitus without complications: Secondary | ICD-10-CM | POA: Diagnosis not present

## 2015-02-15 DIAGNOSIS — Z9181 History of falling: Secondary | ICD-10-CM | POA: Diagnosis not present

## 2015-02-17 DIAGNOSIS — Z9181 History of falling: Secondary | ICD-10-CM | POA: Diagnosis not present

## 2015-02-17 DIAGNOSIS — G20 Parkinson's disease: Secondary | ICD-10-CM | POA: Diagnosis not present

## 2015-02-17 DIAGNOSIS — M25561 Pain in right knee: Secondary | ICD-10-CM | POA: Diagnosis not present

## 2015-02-17 DIAGNOSIS — E119 Type 2 diabetes mellitus without complications: Secondary | ICD-10-CM | POA: Diagnosis not present

## 2015-02-21 ENCOUNTER — Ambulatory Visit
Admit: 2015-02-21 | Disposition: A | Discharge status: Home / Self Care | Payer: Self-pay | Attending: Internal Medicine | Admitting: Internal Medicine

## 2015-02-21 DIAGNOSIS — Z1231 Encounter for screening mammogram for malignant neoplasm of breast: Secondary | ICD-10-CM | POA: Diagnosis not present

## 2015-02-27 ENCOUNTER — Other Ambulatory Visit: Payer: Self-pay | Admitting: *Deleted

## 2015-02-27 ENCOUNTER — Encounter: Payer: Self-pay | Admitting: Internal Medicine

## 2015-02-27 DIAGNOSIS — M170 Bilateral primary osteoarthritis of knee: Secondary | ICD-10-CM

## 2015-02-27 DIAGNOSIS — H0016 Chalazion left eye, unspecified eyelid: Secondary | ICD-10-CM | POA: Diagnosis not present

## 2015-02-27 MED ORDER — TRAMADOL HCL 50 MG PO TABS: ORAL_TABLET | ORAL | Status: DC

## 2015-02-27 NOTE — Telephone Encounter (Signed)
[facility] # [phone] Fax: [phone]

## 2015-03-10 NOTE — Discharge Summary (Signed)
PATIENT NAME:  Kara Moore, Kara Moore MR#:  [id number] DATE OF BIRTH:  August 12, 1935  DISCHARGE SUMMARY ADDENDUM  DATE OF ADMISSION:  04/30/2013 DATE OF DISCHARGE:  05/02/2013  The patient was actually discharged on 05/02/2013. No changes in her medications or discharged medications. She will be discharged today.     ____________________________ Janyth ContesSital P. Juliene PinaMody, MD spm:dm D: 05/02/2013 13:10:47 ET T: 05/02/2013 14:14:10 ET JOB#: [id number]  cc: Soniyah Mcglory P. Juliene PinaMody, MD, <Dictator> Janyth ContesSITAL P Hadi Dubin MD ELECTRONICALLY SIGNED 05/03/2013 14:20

## 2015-03-10 NOTE — Discharge Summary (Signed)
PATIENT NAME:  Kara Moore, Kara Moore MR#:  [id number] DATE OF BIRTH:  1935-06-30  DATE OF ADMISSION:  04/30/2013 DATE OF DISCHARGE:  05/02/2013   Addendum  We did do a pelvic x-ray and it does show nondisplaced fracture of the superior and inferior  pubic rami on the right. This is obviously nonoperable. Supportive care with pain medications and physical therapy as tolerated.   ____________________________ Lam Bjorklund P. Juliene PinaMody, MD spm:jm D: 05/01/2013 14:19:13 ET T: 05/01/2013 14:48:43 ET JOB#: [id number]  cc: Malajah Oceguera P. Juliene PinaMody, MD, <Dictator> Madilyn Cephas P Kymber Kosar MD ELECTRONICALLY SIGNED 05/02/2013 13:10

## 2015-03-10 NOTE — H&P (Signed)
PATIENT NAME:  Kara Moore, Kara Moore MR#:  [id number] DATE OF BIRTH:  1935-05-05  DATE OF ADMISSION:  04/30/2013  PRIMARY CARE PHYSICIAN: [facility] Ulanda EdisonEmma Williams at [facility].   CHIEF COMPLAINT: Difficulty walking and frequent falls.   HISTORY OF PRESENT ILLNESS: This is a 80 year old female brought to the Emergency Room as she has had significant weakness in her lower extremities and difficulty ambulating. The patient apparently has had multiple falls within the past month. She was arranged for home health physical therapy and occupational therapy a few weeks back, which is now finished. This morning, she was unable to even take a few steps or even get out of bed. The patient's niece brought her to the ER for further evaluation. The patient usually has sitters that come in to take care of her, usually 3 hours in the morning and 3 hours in the evening. She is able to feed herself but they come to help bathe her, and the sitters come to help move her around a bit. The patient denies any numbness or tingling, any blurry vision, any syncope or any other associated symptoms presently.  The patient was [facility] a few weeks back, diagnosed with suspected Parkinson's, underwent an MRI which as per the family did not show any acute disease process. Given her profound weakness and her frequent falls over the past month, she was brought to the ER for further evaluation.   REVIEW OF SYSTEMS:    CONSTITUTIONAL: No documented fever. No weight gain or weight loss. EYES: No blurry or double vision.  EARS, NOSE, THROAT: No tinnitus. No postnasal drip. No redness of oropharynx.  RESPIRATORY: No cough, no wheeze, no hemoptysis.  CARDIOVASCULAR: No chest pain, no orthopnea, no palpitations, no syncope.  GASTROINTESTINAL: No nausea, no vomiting, no diarrhea, no abdominal pain, no melena, no hematochezia.  GENITOURINARY: No dysuria or hematuria.  ENDOCRINE: No polyuria or nocturia, heat or  cold intolerance.  HEMATOLOGIC: No anemia, no bruising, no bleeding.  INTEGUMENTARY: No rashes, no lesions.  MUSCULOSKELETAL: No arthritis, no swelling, no gout.  NEUROLOGIC: No numbness, no tingling, no ataxia, no seizure-type activity.  PSYCHIATRIC: No anxiety, no insomnia, no ADD. Positive Parkinson's.   PAST MEDICAL HISTORY: Consistent with diabetes, hypertension, hyperlipidemia, hypothyroidism, GERD.   ALLERGIES: No known drug allergies.   SOCIAL HISTORY: No smoking. No alcohol abuse. No illicit drug abuse. Lives at home by herself.   FAMILY HISTORY: Both mother and father died from complications of MI. Father also had tuberculosis.   CURRENT MEDICATIONS: Tylenol with codeine 1 tab q.6 hours as needed, aspirin 81 mg daily, calcium carbonate 1 tab at noon, Sinemet 25/100 mg 1/2 tab t.i.d., cyclobenzaprine 10 mg 1/2 tab t.i.d. as needed, Diovan 160 mg daily, Flonase 2 sprays to each nostril daily, Lasix 20 mg 1/2 tab daily, glipizide XL 10 mg daily, Synthroid 25 mcg daily, lovastatin 40 mg daily, potassium chloride 10 mEq daily, ranitidine 150 mg b.i.d. vitamin D3 with 5000 international units weekly on Sunday.   PHYSICAL EXAMINATION ON ADMISSION:  VITAL SIGNS: Temperature is 98.5, pulse 86, respirations 20, blood pressure 147/65, sats 96% on room air.  GENERAL: She is a pleasant-appearing female, lethargic, but in no apparent distress.  HEENT: Atraumatic, normocephalic. Extraocular muscles are intact. Pupils equal and reactive to light. Sclerae anicteric. No conjunctival injection. No pharyngeal erythema.  NECK: Supple. There is no jugular venous distention, no bruits, no lymphadenopathy, no thyromegaly.  HEART: Regular rate and rhythm. No  murmurs, no rubs, no clicks.  LUNGS: Clear to auscultation bilaterally. No rales, no rhonchi, no wheezes.  ABDOMEN: Soft, flat, nontender, nondistended. Has good bowel sounds. No hepatosplenomegaly appreciated.  EXTREMITIES: No evidence of any  cyanosis, clubbing or peripheral edema. Has +2 pedal and radial pulses bilaterally.  NEUROLOGICAL: The patient is alert, awake and oriented x 3. She is globally weak, more in her lower extremities, about 2/5 strength in the lower extremities bilaterally, 4/5 strength in the upper extremities bilaterally. She does have a parkinsonian tremor, otherwise a nonfocal neurological exam.  SKIN: Moist and warm with no rashes.  LYMPHATIC: There is no cervical or axillary lymphadenopathy.   LABORATORY AND RADIOLOGICAL DATA: Serum glucose 160, BUN 31, creatinine 0.9, sodium 135, potassium 4.2, chloride 102, bicarbonate 27. LFTs are within normal limits. Troponin less than 0.02. White cell count is 13.8, hemoglobin 12.5, hematocrit 36.2, platelet count 289. Urinalysis within normal limits.   The patient did have a CT of the head done, which showed no evidence of any acute intracranial process.   ASSESSMENT AND PLAN: This is a 80 year old female with a history of diabetes, hypertension, hyperlipidemia, hypothyroidism, recently diagnosed suspected Parkinson's disease, presents to the hospital due to frequent falls and having difficulty ambulating.  1.  Frequent falls/difficulty ambulating: The exact etiology of this is unclear, but suspected to be related to progressive Parkinson's disease. I will observe the patient overnight. Will get a physical therapy consult to assess mobility, the patient may need home health or possible placement to a short-term rehab based on the physical therapy evaluation.  2.  Hypertension: She is hemodynamically stable. Continue Diovan.  3.  Parkinson's disease: Continue with her Sinemet. 4.  Diabetes: Continue carb-controlled diet and continue her glipizide.  5.  Hyperlipidemia: Continue lovastatin.  6.  Hypothyroidism: Continue Synthroid.  7.  Gastroesophageal reflux disease: Continue ranitidine.   CODE STATUS: The patient is a full code. The plan was discussed with the patient and  the patient's family and they are in agreement.   TIME SPENT: 50 minutes.    ____________________________ Rolly Pancake. Cherlynn Kaiser, MD vjs:jm D: 04/30/2013 16:10:96 ET T: 04/30/2013 20:20:38 ET JOB#: [id number]  cc: Rolly Pancake. Cherlynn Kaiser, MD, <Dictator> Houston Siren MD ELECTRONICALLY SIGNED 05/07/2013 20:39

## 2015-03-10 NOTE — Discharge Summary (Signed)
PATIENT NAME:  Kara Moore, Laurana E MR#:  [id number] DATE OF BIRTH:  [birth year]  DATE OF ADMISSION:  04/30/2013 DATE OF DISCHARGE:  05/01/2013   ADMISSION DIAGNOSES:  1.  Frequent falls/difficulty ambulating.  2.  Hypertension.  3.  Parkinson's disease. 4.  Diabetes.  5.  Hyperlipidemia.  6.  Hypothyroidism.  7.  Gastroesophageal reflux disease.   DISCHARGE DIAGNOSES: 1.  Frequent falls with gait instability secondary to Parkinson's disease.  2.  Hypertension.  3.  History of Parkinson's disease.  4.  Diabetes. 5.  Hyperlipidemia.  6.  Hypothyroidism.  7.  Gastroesophageal reflux disease.    CONSULTS: Physical therapy.   No new laboratories at discharge.   CT of the head showed no acute intracranial hemorrhage or CVA.  HOSPITAL COURSE: This is a 80 year old female with history of Parkinson's disease and gait instability for some time who presents after a fall. For further details, please refer to the H and P.  1.  Frequent falls with difficulty ambulating and gait instability. This has been worked up in the past and thought to be secondary to Parkinson's disease. Physical therapy saw the patient in consultation and recommended home with home health care, but the patient would like to go to skilled nursing facility. There is a bed today.  Case management has talked to the patient and the family about the cost of the skilled nursing facility, as the patient is observation status.  2.  Hypertension, stable on Diovan.  3.  Parkinson's disease. The patient will continue Sinemet 4.  Diabetes. The patient will continue glipizide.  5.  Hyperlipidemia, on lovastatin.  6.  Hypothyroidism, on Synthroid.   DISCHARGE MEDICATIONS: 1.  Diovan 160 mg daily.  2.  Vitamin D3 5000 international units on Sundays.  3.  Synthroid 25 mcg daily.  4.  Aspirin 81 mg daily.  5.  Lasix 20 mg 1/2 tablet in the morning.  6.  Glipizide 10 mg daily.  7.  Potassium chloride 10 mEq daily.  8.  Calcium carbonate  1 tablet daily at noon.  9.  Fluticasone nasal spray 2 sprays daily as needed.  10.  Ranitidine 150 mg b.i.d. p.r.n.  11.  ICaps 2 tablets daily.  12.  Lovastatin 40 mg at bedtime.  13.  Acetaminophen/codeine No. 3, 1 tablet q.6 hours p.r.n. pain.  14.  Carbidopa/levodopa 25/100, 1/2 tablet t.i.d.  15.  Witch hazel topical 1 to 2 hours as needed for perineal discomfort.   DISCHARGE DIET: ADA diet. Dietary supplement: Ensure once a day.   DISCHARGE ACTIVITY: As tolerated.   DISCHARGE REFERRAL: Physical therapy.  DISCHARGE FOLLOWUP: The patient can follow up with the MD at the facility in 1 week.   TIME SPENT: Approximately 35 minutes. The patient is medically stable for discharge.    ____________________________ Elliot Simoneaux P. Juliene PinaMody, MD spm:jm D: 05/01/2013 13:57:39 ET T: 05/01/2013 14:09:41 ET JOB#: [id number]  cc: Korryn Pancoast P. Juliene PinaMody, MD, <Dictator> Janyth ContesSITAL P Kadin Bera MD ELECTRONICALLY SIGNED 05/01/2013 14:26

## 2015-04-10 ENCOUNTER — Ambulatory Visit (INDEPENDENT_AMBULATORY_CARE_PROVIDER_SITE_OTHER): Payer: Medicare Other | Admitting: Neurology

## 2015-04-10 ENCOUNTER — Encounter: Payer: Self-pay | Admitting: Neurology

## 2015-04-10 VITALS — BP 130/78 | HR 72 | Resp 16 | Ht 63.0 in | Wt 153.0 lb

## 2015-04-10 DIAGNOSIS — Z9181 History of falling: Secondary | ICD-10-CM | POA: Diagnosis not present

## 2015-04-10 DIAGNOSIS — M25561 Pain in right knee: Secondary | ICD-10-CM | POA: Diagnosis not present

## 2015-04-10 DIAGNOSIS — R269 Unspecified abnormalities of gait and mobility: Secondary | ICD-10-CM | POA: Diagnosis not present

## 2015-04-10 DIAGNOSIS — G20 Parkinson's disease: Secondary | ICD-10-CM

## 2015-04-10 DIAGNOSIS — R296 Repeated falls: Secondary | ICD-10-CM | POA: Diagnosis not present

## 2015-04-10 DIAGNOSIS — Z8781 Personal history of (healed) traumatic fracture: Secondary | ICD-10-CM

## 2015-04-10 NOTE — Patient Instructions (Signed)
We will continue with Sinemet 1 pill 3 times a day.   Use your walker at all times.   Drink more water.   Change positions slowly.   Consider seeing orthopedics again for your right knee pain.

## 2015-04-10 NOTE — Progress Notes (Signed)
Subjective:    Patient ID: Kara Moore is a 80 y.o. female.  HPI     Interim history:  Ms. Kuck is a very pleasant 80 year old right-handed woman with an underlying medical history of diabetes, hyperlipidemia, hypothyroidism, vitamin D deficiency, reflux disease, and hypertension and osteoporosis who presents for followup consultation of her gait disorder and parkinsonism, probable L sided predominant PD, complicated by recurrent falls. She is accompanied by her niece, [name], again today. I last saw her on 12/08/2014, at which time her niece reported several falls. She was drooling more. She had knee pain and swelling. She had gone to the emergency room. She had seen orthopedics and received injections into both knees which improved her pain significantly. They were considering Synvisc injections. She had had some therapy at [facility]. She was not drinking enough water. She was using a 2 wheeled walker more consistently. She went to [facility] and had x-rays of her knees and ultrasound of her legs which did not show any clots per [name]. I suggested we increase her Sinemet to 1 pill 3 times a day. I asked her to use her 2 wheeled walker consistently.   Today, 04/10/2015: She reports R knee pain. She did not go back to ortho. Additional information is provided by [name]. She was seen by her PCP, Dr. Mariea Clonts on 01/29/14. I reviewed the note. She resides at [facility]. She has an occasional L hand tremor, especially when tired. She has not fallen recently. A1c on 01/24/15 was 8.7. She had chalazion on the L eye, improved. Her memory is stable. [name] does not have any acute concerns.  Previously:  I saw her on 06/10/2014, at which time [name] reported that they did not fill the order for the walker. The patient had a steroid injection into the right knee which helped marginally. She was reporting more knee pain on the left. She had an x-ray of her right knee. I  asked her to use a rolling walker at all times for gait safety. We talked about her gait disorder and the complexity of her situation. I continued low-dose Sinemet.  I saw her on 01/18/2014, at which time we talked about her using her walker at all times. She was on Remeron for sleep. I prescribed a rolling walker with seat. She indicated that she would not want to use a walker.   I saw her on 09/09/2013, at which time I felt that her gait disorder with recurrent falls was out of proportion to her parkinsonism. I felt that her gait disorder was a function of advancing age, previous pelvic fracture, and arthritis. She had finished inpatient rehabilitation at the time. We talked about her recent additional testing including head CT, EEG, carotid Doppler study, cardiac monitor, all of which were unrevealing. She was advised to continue using her 2 wheeled walker at all times.   I saw her on 06/17/2013 after a recent fall and suggested further workup for a recent episode of confusion and her recurrent falls. I ordered CTH, EEG, C. Doppler, cardiac monitor and suggested a referral to geriatrics. She has since then started seeing Dr. Mariea Clonts.   Her head CT from 06/22/2013 was negative for any acute findings.   Her EEG from 06/25/2013 was normal in the awake and sleep states.   Her carotid Doppler study from 06/25/2013 was negative for any significant stenoses.   Her cardiac event monitor from 07/01/2013 through 07/30/2013 was consistent with normal sinus rhythm with  occasional PVCs, no evidence of atrial fibrillation. This was interpreted by Dr. Dola Argyle.   She had an Echocardiogram on 04/15/13 at the [facility] in [place]. She has arthritis, in her R knee and had 2 injections before.   She lives at [facility] in [place]. She feels like "I have legs again". She is tolerating the C/L 25/100 mg 1/2 tid.   I first met her on 03/26/2013 at which time her niece and the patient reported a  approximately two-month history of decrease in fine motor skills, decrease in mobility, gait changes and recurrent falls. She reported becoming slower than usual taking longer to dress herself, needing assistance with some of her ADLs. She was started on C/L at the time. She has had urinary urgency. She recently had physical therapy and her therapist noted lack of ability, inability to dress herself and drooling. The patient had to give up driving.   She had a MRI brain on 04/08/13: Abnormal MRI scan of the brain showing mild changes of age-appropriate chronic microvascular ischemia and generalized cerebral atrophy.   On 02/11/13 she fell out of her bed and had difficulty getting back up. Her nephew was visiting from [place], but he could not hear her, as he is hard of hearing and she was calling in a soft voice. She developed rhabdomyolysis. In the latter part of April she was taken to [facility] in [place], then to the ER for suspected cellulitis. She had an US of the LEs and did not have a blood clot. She was given lasix for swelling. The next day she fell, and then again, and then again the next day. After these falls, she started having care 24/7 through [facility]. She has been in PT since the end of April. She has been using a 2 wheeled walker since then.   She has a strong FHx of tremors on her mother's side (including M, MGM and cousins). Both of her siblings passed away.   She never had a TIA or a stroke.   She fell at home on 04/30/13, while coming out of the bathroom. She has been having issues with constipation and had taken Dulcolax suppositories, but also took Mag citrate and her niece found her on the floor and she had soiled herself and for some reason had not pushed her Lifeline button. She denied hitting her head or LOC. Her niece called 52 and she was taken to [facility] to the ER and was discharged to home from there. Then she was admitted to inpt rehab at [facility] on  05/02/13. She fell while in rehab and was found to have a pelvic Fx b/l, reportedly old. She has had some confusion intermittently.    Her Past Medical History Is Significant For: Past Medical History  Diagnosis Date  . Benign essential hypertension   . Diabetes mellitus without complication   . Hyperlipidemia LDL goal < 100   . Hypothyroidism   . Memory loss   . Confusion   . Senile osteoporosis   . Macular degeneration     Her Past Surgical History Is Significant For: Past Surgical History  Procedure Laterality Date  . Adenoidectomy  2011    Her Family History Is Significant For: Family History  Problem Relation Age of Onset  . Tremor Maternal Grandmother   . Diabetes Paternal Grandfather   . Heart attack Father   . Cancer Sister   . Heart attack Brother   . Diabetes Brother   .  Tremor Maternal Aunt   . Diabetes Paternal Grandmother     Her Social History Is Significant For: History   Social History  . Marital Status: Single    Spouse Name: N/A  . Number of Children: N/A  . Years of Education: N/A   Social History Main Topics  . Smoking status: Never Smoker   . Smokeless tobacco: Not on file  . Alcohol Use: No  . Drug Use: No  . Sexual Activity: Not on file   Other Topics Concern  . None   Social History Narrative    Her Allergies Are:  No Known Allergies:   Her Current Medications Are:  Outpatient Encounter Prescriptions as of 04/10/2015  Medication Sig  . calcium carbonate (OS-CAL - DOSED IN MG OF ELEMENTAL CALCIUM) 1250 (500 CA) MG tablet Take 1 tablet by mouth.  Marland Kitchen acetaminophen (TYLENOL) 500 MG tablet Take 500 mg by mouth. (STANDING ORDER) take 1 tablet evert 4 hours as need for fever up to 101.  headach and or minor discomfort.  ( don not exceed 2000 mg)  **MAX DOSE IS 3,000/24 HRS FROM ALL SOURCES88  . aspirin 81 MG chewable tablet Chew 81 mg by mouth daily.   . carbidopa-levodopa (SINEMET IR) 25-100 MG per tablet Take 1 tablet by mouth 3 (three)  times daily. Follow titration instructions provided. For Parkinson's Disease  . Cholecalciferol 2000 UNITS CAPS Take 1 capsule (2,000 Units total) by mouth daily.  . fluticasone (FLONASE) 50 MCG/ACT nasal spray Place 2 sprays into the nose as needed.   . furosemide (LASIX) 20 MG tablet Take 1/2 tablet orally every day  . glipiZIDE (GLUCOTROL) 5 MG tablet Take 5 mg by mouth daily before breakfast. For Diabetes (elevated blood sugar)  . guaiFENesin (ROBITUSSIN) 100 MG/5ML liquid Take 100 mg by mouth. (Standing Order) 2 tsp ( 10cc) orally evry six hours as need for co;ugh**NOT TO EXCEED 4 DOSES  . HYDROcodone-acetaminophen (NORCO) 5-325 MG per tablet Take one tablet by mouth every 6 hours as needed for severe pain. Do not exceed 3g of APAP per day  . hydrocortisone (ANUSOL-HC) 2.5 % rectal cream Place 1 application rectally as needed for hemorrhoids or itching.  . levothyroxine (SYNTHROID, LEVOTHROID) 50 MCG tablet TAKE 1 TABLET BY MOUTH DAILY BEFORE BREAKFAST ON EMPTY STOMACH *BINGO CARD*  . loperamide (IMODIUM) 2 MG capsule Take 2 mg by mouth. Take one tablet orally with each loose stool as need for diarrhea * NTE 8 doses in 24 hours*  . magnesium hydroxide (MILK OF MAGNESIA) 400 MG/5ML suspension Take by mouth daily as needed for constipation.  . metFORMIN (GLUCOPHAGE) 500 MG tablet Take 1 tablet (500 mg total) by mouth 2 (two) times daily with a meal.  . mirtazapine (REMERON) 15 MG tablet Take 15 mg by mouth at bedtime.  . Multiple Vitamins-Minerals (ICAPS MV) TABS Take 2 tablets by mouth. Take 2 capsules daily  . neomycin-bacitracin-polymyxin (NEOSPORIN) ointment Apply 1 application topically as needed for wound care. apply to eye  . potassium chloride (K-DUR,KLOR-CON) 10 MEQ tablet Take 10 mEq by mouth daily. Take to prevent potassium loss (due to taking fluid pill)  . pravastatin (PRAVACHOL) 40 MG tablet Take 1 tablet (40 mg total) by mouth daily. For Cholesterol  . ranitidine (ZANTAC) 150 MG  capsule Take 150 mg by mouth 2 (two) times daily.   . traMADol (ULTRAM) 50 MG tablet Take one tablet by mouth twice daily for pain  . traZODone (DESYREL) 50 MG tablet Take 50  mg by mouth at bedtime as needed.   . [DISCONTINUED] alum & mag hydroxide-simeth (MAALOX PLUS) 400-400-40 MG/5ML suspension Take by mouth every 6 (six) hours as needed for indigestion. Take 30 ML up to four times a day as needed  . [DISCONTINUED] aluminum-magnesium hydroxide 200-200 MG/5ML suspension Take 5 mLs by mouth. (standing order) 30 cc (2 TBSO) orally up to 4 times a day as needed for heartburn or indigestion ** not to exceed 4 doses in 24 hours**  . [DISCONTINUED] Calcium Carb-Cholecalciferol 500-200 MG-UNIT TABS Take by mouth daily.  . [DISCONTINUED] calcium-vitamin D (OSCAL WITH D) 500-200 MG-UNIT per tablet Take 1 tablet by mouth daily.   No facility-administered encounter medications on file as of 04/10/2015.  :  Review of Systems:  Out of a complete 14 point review of systems, all are reviewed and negative with the exception of these symptoms as listed below:    Review of Systems  Musculoskeletal:       Knee and foot pain in R leg   All other systems reviewed and are negative.   Objective:  Neurologic Exam  Physical Exam Physical Examination:   Filed Vitals:   04/10/15 1302  BP: 130/78  Pulse: 72  Resp: 16    General Examination: The patient is a very pleasant 80 y.o. female in no acute distress. She is calm and cooperative with the exam. She denies Auditory Hallucinations and Visual Hallucinations.    HEENT: Normocephalic, atraumatic, pupils are equal, round and reactive to light and accommodation. Extraocular tracking shows moderate saccadic breakdown without nystagmus noted. Hearing is intact. Face is symmetric with moderate facial masking and normal facial sensation. Excess seborrhoic changes are seen on her face. There is no lip, neck or jaw tremor. Neck is moderately rigid with intact  passive ROM. There are no carotid bruits on auscultation. Oropharynx exam reveals mild mouth dryness. No significant airway crowding is noted. Mallampati is class II. Tongue protrudes centrally and palate elevates symmetrically. Mild to moderate drooling is noted.  Chest: is clear to auscultation without wheezing, rhonchi or crackles noted.  Heart: sounds are regular and normal with a 1/6 early systolic murmur, no rubs or gallops noted, unchanged.   Abdomen: is soft, non-tender and non-distended with normal bowel sounds appreciated on auscultation.  Extremities: There is trace edema in the lower extremities bilaterally, from the knee on down. She is wearing compression stockings up to the knees.   Skin: is warm and dry with no trophic changes noted.   Musculoskeletal: exam reveals no obvious joint deformities, tenderness or joint swelling or erythema, with the exception of right knee pain reported. She has no significant joint swelling that I could tell.  Neurologically:  Mental status: The patient is awake and alert, paying good attention. She is able to mostly provide the history. Her nieces provide some details. She is oriented to: person, place, time/date, situation, day of week, month of year and year. Her memory, attention, language and knowledge are impaired, mildly. There is no aphasia, agnosia, apraxia or anomia. There is a mild degree of bradyphrenia. Speech is mildly hypophonic with no dysarthria noted. Mood is congruent and affect is normal.   In May 2014: Her MMSE score was 29/30. CDT 4/4. AFT (Animal Fluency Test) score was 14.  On 12/08/2014: MMSE:27/30, CDT: 1/4, AFT: 18/min.   On 04/10/2015: MMSE: 29/30, CDT: 4/4, AFT: 12/min.   Cranial nerves are as described above under HEENT exam. In addition, shoulder shrug is normal with equal shoulder  height noted.  Motor exam: Normal bulk, and strength for age is noted. Tone is mildly rigid with absence of cogwheeling. There is overall  moderate bradykinesia. There is no drift or rebound. There is a mild bilateral upper extremity resting tremor, and no significant lower extremity tremor today.    Romberg is negative. Reflexes are 1+ in the upper extremities and 1+ in the lower extremities.Fine motor skills: Finger taps, hand movements, and rapid alternating patting are moderately impaired on the L and mild to moderately impaired on the R. Foot taps and foot agility are moderately impaired bilaterally, L worse than R.    Cerebellar testing shows no dysmetria or intention tremor on finger to nose testing. There is no truncal or gait ataxia.   Sensory exam is intact to light touch, PP and temperature sense in the UEs and LEs.   Gait, station and balance: she stands with mild difficulty and pushes herself up and takes 2 attempts. She does report some right knee pain with standing. She has a moderately stooped posture. She walks slowly and insecurely and has some instability when turning. She uses her 2 wheeled walker today. Her balance is mild to moderately impaired.   Assessment and Plan:   In summary, TIFFENY MINCHEW is a very pleasant 80 year old female with a history of slowness, stiffness, recurrent falls, problems with fine motor skills, her posture, as well as drooling. She has evidence of left-sided predominant Parkinson's disease vs parkinsonism, and a significant gait disorder with recurrent falls with injuries including pelvic fracture in the past. Her gait disorder is likely a function of multiple additional issues including advancing age, previous pelvic fracture, arthritis, especially affecting her knees and tendency towards dehydration. She is in assisted living, [place] and has had some in-house physical therapy. Thankfully, she has not fallen recently. She is advised to use her walker at all times. I suggested, she continue Sinemet 1 pill 3 times a day.  She is at risk for injury because of advanced age, history of  osteoporosis and vitamin D deficiency, diabetes, advanced age and prior injuries. I  asked her to increase her water intake, and use her walker at all times. She has had some improvement of her arthritic pain with steroid injections to both knees, but reports recent increase in R knee pain. Lately her hemoglobin A1c was 8.7 in March. She is supposed to have blood work again in July. She is encouraged to consider going back to orthopedics if her right knee pain continues to bother her. Her memory is stable, which is reassuring.   In the past, she had testing including CTH, EEG, C. Doppler, cardiac monitor, all of which were unrevealing. I would like to see her back in about 4 months from now, sooner if the need arises.  I answered all their questions today and the patient and her knees were in agreement.   I spent 25 minutes in total face-to-face time with the patient, more than 50% of which was spent in counseling and coordination of care, reviewing test results, reviewing medication and discussing or reviewing the diagnosis of PD, its prognosis and treatment options.

## 2015-04-20 DIAGNOSIS — H3531 Nonexudative age-related macular degeneration: Secondary | ICD-10-CM | POA: Diagnosis not present

## 2015-05-17 DIAGNOSIS — E11329 Type 2 diabetes mellitus with mild nonproliferative diabetic retinopathy without macular edema: Secondary | ICD-10-CM | POA: Diagnosis not present

## 2015-05-30 ENCOUNTER — Other Ambulatory Visit: Payer: Medicare Other

## 2015-05-30 DIAGNOSIS — E119 Type 2 diabetes mellitus without complications: Secondary | ICD-10-CM | POA: Diagnosis not present

## 2015-05-30 DIAGNOSIS — E039 Hypothyroidism, unspecified: Secondary | ICD-10-CM

## 2015-05-31 LAB — CBC WITH DIFFERENTIAL/PLATELET
Basophils Absolute: 0 10*3/uL (ref 0.0–0.2)
Basos: 0 %
EOS (ABSOLUTE): 0.1 10*3/uL (ref 0.0–0.4)
Eos: 2 %
Hematocrit: 43.4 % (ref 34.0–46.6)
Hemoglobin: 14.3 g/dL (ref 11.1–15.9)
Immature Grans (Abs): 0 10*3/uL (ref 0.0–0.1)
Immature Granulocytes: 0 %
Lymphocytes Absolute: 1.7 10*3/uL (ref 0.7–3.1)
Lymphs: 26 %
MCH: 30.1 pg (ref 26.6–33.0)
MCHC: 32.9 g/dL (ref 31.5–35.7)
MCV: 91 fL (ref 79–97)
Monocytes Absolute: 0.4 10*3/uL (ref 0.1–0.9)
Monocytes: 6 %
Neutrophils Absolute: 4.1 10*3/uL (ref 1.4–7.0)
Neutrophils: 66 %
Platelets: 274 10*3/uL (ref 150–379)
RBC: 4.75 x10E6/uL (ref 3.77–5.28)
RDW: 14.2 % (ref 12.3–15.4)
WBC: 6.3 10*3/uL (ref 3.4–10.8)

## 2015-05-31 LAB — HEMOGLOBIN A1C
Est. average glucose Bld gHb Est-mCnc: 148 mg/dL
Hgb A1c MFr Bld: 6.8 % — ABNORMAL HIGH (ref 4.8–5.6)

## 2015-05-31 LAB — LIPID PANEL
Chol/HDL Ratio: 2.6 ratio units (ref 0.0–4.4)
Cholesterol, Total: 166 mg/dL (ref 100–199)
HDL: 65 mg/dL (ref >39)
LDL Calculated: 82 mg/dL (ref 0–99)
Triglycerides: 94 mg/dL (ref 0–149)
VLDL Cholesterol Cal: 19 mg/dL (ref 5–40)

## 2015-05-31 LAB — TSH: TSH: 2.85 u[IU]/mL (ref 0.450–4.500)

## 2015-06-01 ENCOUNTER — Encounter: Payer: Self-pay | Admitting: Internal Medicine

## 2015-06-01 ENCOUNTER — Ambulatory Visit (INDEPENDENT_AMBULATORY_CARE_PROVIDER_SITE_OTHER): Payer: Medicare Other | Admitting: Internal Medicine

## 2015-06-01 VITALS — BP 130/90 | HR 67 | Temp 97.7°F | Resp 20 | Ht 63.0 in | Wt 148.4 lb

## 2015-06-01 DIAGNOSIS — E1149 Type 2 diabetes mellitus with other diabetic neurological complication: Secondary | ICD-10-CM | POA: Diagnosis not present

## 2015-06-01 DIAGNOSIS — G20A1 Parkinson's disease without dyskinesia, without mention of fluctuations: Secondary | ICD-10-CM

## 2015-06-01 DIAGNOSIS — E039 Hypothyroidism, unspecified: Secondary | ICD-10-CM | POA: Diagnosis not present

## 2015-06-01 DIAGNOSIS — I10 Essential (primary) hypertension: Secondary | ICD-10-CM

## 2015-06-01 DIAGNOSIS — H0015 Chalazion left lower eyelid: Secondary | ICD-10-CM

## 2015-06-01 DIAGNOSIS — G20 Parkinson's disease: Secondary | ICD-10-CM

## 2015-06-01 DIAGNOSIS — E785 Hyperlipidemia, unspecified: Secondary | ICD-10-CM | POA: Diagnosis not present

## 2015-06-01 DIAGNOSIS — M170 Bilateral primary osteoarthritis of knee: Secondary | ICD-10-CM

## 2015-06-01 NOTE — Progress Notes (Signed)
Patient ID: Kara Moore, female   DOB: 07-21-1935, 80 y.o.   MRN: [id number]   Location:  [facility]  Code Status: DNR Goals of Care: Advanced Directive information Does patient have an advance directive?: Yes, Type of Advance Directive: Healthcare Power of [place];Living will, Does patient want to make changes to advanced directive?: No - Patient declined   Chief Complaint  Patient presents with  . Medical Management of Chronic Issues    4 month follow-up    HPI: Patient is a 80 y.o. white female seen in the office today for medical mgt of chronic diseases (4 month f/u).  She lives at [facility] and she is accompanied by her 2 nieces.     Has had chalazion on her eye.  Has seen regular eye doctor and a specialist.  Last visit was 6/2.  Was not bethering her but everyone was concerned aout it.  Got compresses and ointment initially, but then referred to [facility] re: lid. She was not concerned.  Followed up again 6/2 and had regular mac degen check-up.  Still felt ok.  No change with mac degen.  Then f/u with regular eye doctor again and he gave her a Rx for drops for the eye.  Left eye.    Re: Parkinson's, she saw Dr. Frances Furbish in May.  No falls.  Is using her two wheeled walker.  Says she is probably not doing a lot better with her water--b/c she ran out of lemon flavoring.    Right knee pain--has not gone back to orthopedics re: this.  Had her right leg ultrasound and report is missing from epic.  Calf on right is still painful as the compression hose are being put on, but after they're on, it feels better unless she bumps it.  Hba1c 6.8.  Review of twice weekly cbgs shows glucose 94 to max 158, mostly under 130.     Lost 4 lbs since last visit.  Says she does not like the food a lot of the time and has cut back on her snacks.    Review of Systems:  Review of Systems  Constitutional: Positive for weight loss. Negative for fever,  chills and malaise/fatigue.  HENT: Negative for congestion.   Eyes: Negative for blurred vision, double vision, photophobia, pain, discharge and redness.       Chalazion on left lower lid  Respiratory: Negative for shortness of breath.   Cardiovascular: Negative for chest pain and leg swelling.  Gastrointestinal: Negative for abdominal pain, constipation, blood in stool and melena.  Genitourinary: Negative for dysuria.  Musculoskeletal: Positive for joint pain. Negative for falls.  Neurological: Positive for tremors. Negative for dizziness, loss of consciousness, weakness and headaches.       Parkinson's  Endo/Heme/Allergies:       Diabetes  Psychiatric/Behavioral: Positive for memory loss. Negative for depression and hallucinations. The patient is not nervous/anxious and does not have insomnia.     Past Medical History  Diagnosis Date  . Benign essential hypertension   . Diabetes mellitus without complication   . Hyperlipidemia LDL goal < 100   . Hypothyroidism   . Memory loss   . Confusion   . Senile osteoporosis   . Macular degeneration     Past Surgical History  Procedure Laterality Date  . Adenoidectomy  2011    No Known Allergies Medications: Patient's Medications  New Prescriptions   No medications on file  Previous Medications  ACETAMINOPHEN (TYLENOL) 500 MG TABLET    Take 500 mg by mouth. (STANDING ORDER) take 1 tablet evert 4 hours as need for fever up to 101.  headach and or minor discomfort.  ( don not exceed 2000 mg)  **MAX DOSE IS 3,000/24 HRS FROM ALL SOURCES88   ALUM & MAG HYDROXIDE-SIMETH (GERI-LANTA PO)    Take 30 mLs by mouth as needed. For pain   ASPIRIN 81 MG CHEWABLE TABLET    Chew 81 mg by mouth daily.    CALCIUM CARBONATE (OS-CAL - DOSED IN MG OF ELEMENTAL CALCIUM) 1250 (500 CA) MG TABLET    Take 1 tablet by mouth.   CARBIDOPA-LEVODOPA (SINEMET IR) 25-100 MG PER TABLET    Take 1 tablet by mouth 3 (three) times daily. Follow titration instructions  provided. For Parkinson's Disease   CHOLECALCIFEROL 2000 UNITS CAPS    Take 1 capsule (2,000 Units total) by mouth daily.   FLUTICASONE (FLONASE) 50 MCG/ACT NASAL SPRAY    Place 2 sprays into the nose as needed.    FUROSEMIDE (LASIX) 20 MG TABLET    Take 1/2 tablet orally every day   GLIPIZIDE (GLUCOTROL) 5 MG TABLET    Take 5 mg by mouth daily before breakfast. For Diabetes (elevated blood sugar)   GUAIFENESIN (ROBITUSSIN) 100 MG/5ML LIQUID    Take 100 mg by mouth. (Standing Order) 2 tsp ( 10cc) orally evry six hours as need for co;ugh**NOT TO EXCEED 4 DOSES   HYDROCODONE-ACETAMINOPHEN (NORCO) 5-325 MG PER TABLET    Take one tablet by mouth every 6 hours as needed for severe pain. Do not exceed 3g of APAP per day   HYDROCORTISONE (ANUSOL-HC) 2.5 % RECTAL CREAM    Place 1 application rectally as needed for hemorrhoids or itching.   LEVOTHYROXINE (SYNTHROID, LEVOTHROID) 50 MCG TABLET    TAKE 1 TABLET BY MOUTH DAILY BEFORE BREAKFAST ON EMPTY STOMACH *BINGO CARD*   LOPERAMIDE (IMODIUM) 2 MG CAPSULE    Take 2 mg by mouth. Take one tablet orally with each loose stool as need for diarrhea * NTE 8 doses in 24 hours*   MAGNESIUM HYDROXIDE (MILK OF MAGNESIA) 400 MG/5ML SUSPENSION    Take by mouth daily as needed for constipation.   METFORMIN (GLUCOPHAGE) 500 MG TABLET    Take 1 tablet (500 mg total) by mouth 2 (two) times daily with a meal.   MIRTAZAPINE (REMERON) 15 MG TABLET    Take 15 mg by mouth at bedtime.   MULTIPLE VITAMINS-MINERALS (ICAPS MV) TABS    Take 2 tablets by mouth. Take 2 capsules daily   NEOMYCIN-BACITRACIN-POLYMYXIN (NEOSPORIN) OINTMENT    Apply 1 application topically as needed for wound care. apply to eye   POTASSIUM CHLORIDE (K-DUR,KLOR-CON) 10 MEQ TABLET    Take 10 mEq by mouth daily. Take to prevent potassium loss (due to taking fluid pill)   PRAVASTATIN (PRAVACHOL) 40 MG TABLET    Take 1 tablet (40 mg total) by mouth daily. For Cholesterol   RANITIDINE (ZANTAC) 150 MG CAPSULE     Take 150 mg by mouth 2 (two) times daily.    TRAMADOL (ULTRAM) 50 MG TABLET    Take one tablet by mouth twice daily for pain   TRAZODONE (DESYREL) 50 MG TABLET    Take 50 mg by mouth at bedtime as needed.   Modified Medications   No medications on file  Discontinued Medications   No medications on file    Physical Exam: Filed Vitals:   06/01/15  1016  BP: 130/90  Pulse: 67  Temp: 97.7 F (36.5 C)  TempSrc: Oral  Resp: 20  Height: 5\' 3"  (1.6 m)  Weight: 148 lb 6.4 oz (67.314 kg)  SpO2: 95%   Physical Exam  Constitutional: She appears well-developed and well-nourished. No distress.  Eyes:  Left eye with small visible chalazion on inner lower lid; no associated swelling, tenderness or erythema, drainage at present  Cardiovascular: Normal rate, regular rhythm, normal heart sounds and intact distal pulses.   Pulmonary/Chest: Effort normal and breath sounds normal. No respiratory distress.  Abdominal: Soft. Bowel sounds are normal. She exhibits no distension. There is no tenderness.  Musculoskeletal: Normal range of motion. She exhibits tenderness.  Right knee and posterior calf  Neurological: She is alert.  Skin: Skin is warm and dry.  Psychiatric:  Flat affect, but she's more conversive and participating in discussion today--some history still obtained from her nieces    Labs reviewed: Basic Metabolic Panel:  Recent Labs  16/08/9611/07/15 0920 11/12/14 0034 01/24/15 0913 05/30/15 0842  NA 137 139 138  --   K 4.4 3.8 4.6  --   CL 101 105 98  --   CO2 22 28 23   --   GLUCOSE 133* 174* 176*  --   BUN 16 10 10   --   CREATININE 0.79 0.73 0.80  --   CALCIUM 9.6 8.8 9.3  --   TSH 3.230  --  3.020 2.850   Liver Function Tests:  Recent Labs  07/19/14 1007 11/12/14 0034  AST 15 22  ALT 22 10*  ALKPHOS 105 81  BILITOT 0.4  --   PROT 6.8 6.9  ALBUMIN  --  3.3*   No results for input(s): LIPASE, AMYLASE in the last 8760 hours. No results for input(s): AMMONIA in the last  8760 hours. CBC:  Recent Labs  07/19/14 1007 11/12/14 0034 01/24/15 0913 05/30/15 0842  WBC 5.7 6.9 6.9 6.3  NEUTROABS 3.6  --  4.9 4.1  HGB 15.0 13.6 14.6  --   HCT 44.4 41.1 44.9 43.4  MCV 92 91 92  --   PLT  --  252  --   --    Lipid Panel:  Recent Labs  07/19/14 1007 10/24/14 0920 05/30/15 0842  CHOL 203* 169 166  HDL 67 61 65  LDLCALC 116* 93 82  TRIG 98 74 94  CHOLHDL 3.0 2.8 2.6   Lab Results  Component Value Date   HGBA1C 6.8* 05/30/2015   Lab Results  Component Value Date   TSH 2.850 05/30/2015    Procedures since last visit: RLE venous doppler at [facility] (copy never received so 30 mins waiting for copy to be faxed from 4 mos ago):  No DVT or baker's cyst.  Mammogram at [facility]: normal  Assessment/Plan 1. Parkinson's disease (tremor, stiffness, slow motion, unstable posture) -doing better lately--seems she is using her walker a bit more though she remains very resistant to it -cont current sinemet and keep f/u with Dr. Frances FurbishAthar  2. Primary osteoarthritis of both knees -not bothersome enough as of today for her to return to ortho for a repeat injection, cont tramadol and tylenol for this and walking with her walker for exercise  3. Essential hypertension, benign -bp at goal with lasix only anymore considering her PD and fall risk--I had tried to avoid putting her on lasix, but I think she got it in the ER and it got continued due to edema which is not  a real indication--she has refused compression hose  4. Hypothyroidism, unspecified hypothyroidism type -cont current synthroid with last tsh normal this month  5. Diabetes mellitus with neurological manifestations, controlled -well controlled on current metformin, glipizide -is also on her statin, baby asa, but is not on ace/arb due to low bp problems  6. Hyperlipidemia -cont statin therapy, last LDL 82 which is satisfactory with her Parkinson's and dementia  7. Chalazion of left lower  eyelid -has seen two ophthalmologists about this and was treated with a course of antibiotics--still present, but not bothering her so continue to monitor  Labs/tests ordered:  Orders Placed This Encounter  Procedures  . Hemoglobin A1c    Standing Status: Future     Number of Occurrences:      Standing Expiration Date: 01/30/2016  . TSH    Standing Status: Future     Number of Occurrences:      Standing Expiration Date: 01/30/2016  . Basic metabolic panel    Standing Status: Future     Number of Occurrences:      Standing Expiration Date: 01/30/2016  . CBC with Differential/Platelet    Standing Status: Future     Number of Occurrences:      Standing Expiration Date: 01/30/2016   Next appt:  4 mos with labs before  Aurelie Dicenzo L. Tynasia Mccaul, D.O. Geriatrics [facility] [address] [place], [place] [address] Cell Phone (Mon-Fri 8am-5pm):  [phone] On Call:  [phone] & follow prompts after 5pm & weekends Office Phone:  [phone] Office Fax:  [phone]

## 2015-07-13 DIAGNOSIS — R2689 Other abnormalities of gait and mobility: Secondary | ICD-10-CM | POA: Diagnosis not present

## 2015-07-18 DIAGNOSIS — R2689 Other abnormalities of gait and mobility: Secondary | ICD-10-CM | POA: Diagnosis not present

## 2015-08-14 ENCOUNTER — Other Ambulatory Visit: Payer: Self-pay | Admitting: *Deleted

## 2015-08-14 DIAGNOSIS — M170 Bilateral primary osteoarthritis of knee: Secondary | ICD-10-CM

## 2015-08-14 MED ORDER — TRAMADOL HCL 50 MG PO TABS: ORAL_TABLET | ORAL | Status: DC

## 2015-08-14 NOTE — Telephone Encounter (Signed)
[employer]. To be faxed to [facility] #[phone] Fax#: [phone]

## 2015-08-15 ENCOUNTER — Ambulatory Visit (INDEPENDENT_AMBULATORY_CARE_PROVIDER_SITE_OTHER): Payer: Medicare Other | Admitting: Neurology

## 2015-08-15 ENCOUNTER — Encounter: Payer: Self-pay | Admitting: Neurology

## 2015-08-15 VITALS — BP 150/88 | HR 78 | Resp 14 | Ht 63.0 in | Wt 149.0 lb

## 2015-08-15 DIAGNOSIS — G20 Parkinson's disease: Secondary | ICD-10-CM | POA: Diagnosis not present

## 2015-08-15 DIAGNOSIS — Z9181 History of falling: Secondary | ICD-10-CM | POA: Diagnosis not present

## 2015-08-15 DIAGNOSIS — R296 Repeated falls: Secondary | ICD-10-CM

## 2015-08-15 DIAGNOSIS — R269 Unspecified abnormalities of gait and mobility: Secondary | ICD-10-CM | POA: Diagnosis not present

## 2015-08-15 NOTE — Progress Notes (Signed)
Subjective:    Patient ID: Kara Moore is a 80 y.o. female.  HPI     Interim history:   Kara Moore is a very pleasant 80 year old right-handed woman with an underlying medical history of diabetes, hyperlipidemia, hypothyroidism, vitamin D deficiency, reflux disease, and hypertension and osteoporosis who presents for followup consultation of her gait disorder and parkinsonism, probable L sided predominant PD, complicated by recurrent falls. She is accompanied by her niece, Kara Moore, again today. I last saw her on 04/10/2015, at which time she reported right knee pain. She did not go back to orthopedics. She was seen by her PCP recently. She was still residing at [facility]. She had occasional left arm and hand tremor, especially worse when tired. She had no recent falls. Her latest A1c in March 2016 was 8.7. She had left eye surgery for removal of the chalazion. Kara Moore had no new concerns. Her memory was felt to be stable. Her MMSE was 29/30, CDT: 4/4, AFT: 12/min. I suggested she continue with Sinemet 1 pill 3 times a day.   Today, 08/15/2015: She reports doing well. Kara Moore feels, she is doing well. She has not had any recent falls. She uses a rolling walker with seat. For doctor's appointments she uses a 2 wheeled walker which is easier to transport. She has lost some weight. Her diabetes numbers have improved. Her memory is stable. Her appetite is reasonably good. She may not always drink enough water. She is looking into getting physical therapy at the [facility]. She has an appointment for labs on 09/28/2015 and a follow-up with Dr. Mariea Clonts on 10/02/2015. She has not had her flu shot yet.  Previously:  I saw her on 12/08/2014, at which time her niece reported several falls. She was drooling more. She had knee pain and swelling. She had gone to the emergency room. She had seen orthopedics and received injections into both knees which improved her pain significantly. They were  considering Synvisc injections. She had had some therapy at [facility]. She was not drinking enough water. She was using a 2 wheeled walker more consistently. She went to [facility] and had x-rays of her knees and ultrasound of her legs which did not show any clots per Kara Moore. I suggested we increase her Sinemet to 1 pill 3 times a day. I asked her to use her 2 wheeled walker consistently.   I saw her on 06/10/2014, at which time Kara Moore reported that they did not fill the order for the walker. The patient had a steroid injection into the right knee which helped marginally. She was reporting more knee pain on the left. She had an x-ray of her right knee. I asked her to use a rolling walker at all times for gait safety. We talked about her gait disorder and the complexity of her situation. I continued low-dose Sinemet.  I saw her on 01/18/2014, at which time we talked about her using her walker at all times. She was on Remeron for sleep. I prescribed a rolling walker with seat. She indicated that she would not want to use a walker.   I saw her on 09/09/2013, at which time I felt that her gait disorder with recurrent falls was out of proportion to her parkinsonism. I felt that her gait disorder was a function of advancing age, previous pelvic fracture, and arthritis. She had finished inpatient rehabilitation at the time. We talked about her recent additional testing including head CT, EEG, carotid  Doppler study, cardiac monitor, all of which were unrevealing. She was advised to continue using her 2 wheeled walker at all times.   I saw her on 06/17/2013 after a recent fall and suggested further workup for a recent episode of confusion and her recurrent falls. I ordered CTH, EEG, C. Doppler, cardiac monitor and suggested a referral to geriatrics. She has since then started seeing Dr. Mariea Clonts.   Her head CT from 06/22/2013 was negative for any acute findings.   Her EEG from 06/25/2013 was normal in  the awake and sleep states.   Her carotid Doppler study from 06/25/2013 was negative for any significant stenoses.   Her cardiac event monitor from 07/01/2013 through 07/30/2013 was consistent with normal sinus rhythm with occasional PVCs, no evidence of atrial fibrillation. This was interpreted by Dr. Dola [place].   She had an Echocardiogram on 04/15/13 at the [facility] in [place]. She has arthritis, in her R knee and had 2 injections before.   She lives at [facility] in [place]. She feels like "I have legs again". She is tolerating the C/L 25/100 mg 1/2 tid.   I first met her on 03/26/2013 at which time her niece and the patient reported a approximately two-month history of decrease in fine motor skills, decrease in mobility, gait changes and recurrent falls. She reported becoming slower than usual taking longer to dress herself, needing assistance with some of her ADLs. She was started on C/L at the time. She has had urinary urgency. She recently had physical therapy and her therapist noted lack of ability, inability to dress herself and drooling. The patient had to give up driving.   She had a MRI brain on 04/08/13: Abnormal MRI scan of the brain showing mild changes of age-appropriate chronic microvascular ischemia and generalized cerebral atrophy.   On 02/11/13 she fell out of her bed and had difficulty getting back up. Her nephew was visiting from [place], but he could not hear her, as he is hard of hearing and she was calling in a soft voice. She developed rhabdomyolysis. In the latter part of April she was taken to [facility] in [place], then to the ER for suspected cellulitis. She had an US of the LEs and did not have a blood clot. She was given lasix for swelling. The next day she fell, and then again, and then again the next day. After these falls, she started having care 24/7 through [facility]. She has been in PT since the end of April. She has been using a 2 wheeled walker since then.    She has a strong FHx of tremors on her mother's side (including M, MGM and cousins). Both of her siblings passed away.   She never had a TIA or a stroke.   She fell at home on 04/30/13, while coming out of the bathroom. She has been having issues with constipation and had taken Dulcolax suppositories, but also took Mag citrate and her niece found her on the floor and she had soiled herself and for some reason had not pushed her Lifeline button. She denied hitting her head or LOC. Her niece called 28 and she was taken to [facility] to the ER and was discharged to home from there. Then she was admitted to inpt rehab at [facility] center on 05/02/13. She fell while in rehab and was found to have a pelvic Fx b/l, reportedly old. She has had some confusion intermittently.    Her Past  Medical History Is Significant For: Past Medical History  Diagnosis Date  . Benign essential hypertension   . Diabetes mellitus without complication   . Hyperlipidemia LDL goal < 100   . Hypothyroidism   . Memory loss   . Confusion   . Senile osteoporosis   . Macular degeneration     Her Past Surgical History Is Significant For: Past Surgical History  Procedure Laterality Date  . Adenoidectomy  2011    Her Family History Is Significant For: Family History  Problem Relation Age of Onset  . Tremor Maternal Grandmother   . Diabetes Paternal Grandfather   . Heart attack Father   . Cancer Sister   . Heart attack Brother   . Diabetes Brother   . Tremor Maternal Aunt   . Diabetes Paternal Grandmother     Her Social History Is Significant For: Social History   Social History  . Marital Status: Single    Spouse Name: N/A  . Number of Children: N/A  . Years of Education: N/A   Social History Main Topics  . Smoking status: Never Smoker   . Smokeless tobacco: None  . Alcohol Use: No  . Drug Use: No  . Sexual Activity: Not Asked   Other Topics Concern  . None   Social History  Narrative    Her Allergies Are:  No Known Allergies:   Her Current Medications Are:  Outpatient Encounter Prescriptions as of 08/15/2015  Medication Sig  . acetaminophen (TYLENOL) 500 MG tablet Take 500 mg by mouth. (STANDING ORDER) take 1 tablet evert 4 hours as need for fever up to 101.  headach and or minor discomfort.  ( don not exceed 2000 mg)  **MAX DOSE IS 3,000/24 HRS FROM ALL SOURCES88  . Alum & Mag Hydroxide-Simeth (GERI-LANTA PO) Take 30 mLs by mouth as needed. For pain  . aspirin 81 MG chewable tablet Chew 81 mg by mouth daily.   . calcium carbonate (OS-CAL - DOSED IN MG OF ELEMENTAL CALCIUM) 1250 (500 CA) MG tablet Take 1 tablet by mouth.  . carbidopa-levodopa (SINEMET IR) 25-100 MG per tablet Take 1 tablet by mouth 3 (three) times daily. Follow titration instructions provided. For Parkinson's Disease  . Cholecalciferol 2000 UNITS CAPS Take 1 capsule (2,000 Units total) by mouth daily.  . fluticasone (FLONASE) 50 MCG/ACT nasal spray Place 2 sprays into the nose as needed.   . furosemide (LASIX) 20 MG tablet Take 1/2 tablet orally every day  . glipiZIDE (GLUCOTROL) 5 MG tablet Take 5 mg by mouth daily before breakfast. For Diabetes (elevated blood sugar)  . guaiFENesin (ROBITUSSIN) 100 MG/5ML liquid Take 100 mg by mouth. (Standing Order) 2 tsp ( 10cc) orally evry six hours as need for co;ugh**NOT TO EXCEED 4 DOSES  . HYDROcodone-acetaminophen (NORCO) 5-325 MG per tablet Take one tablet by mouth every 6 hours as needed for severe pain. Do not exceed 3g of APAP per day  . hydrocortisone (ANUSOL-HC) 2.5 % rectal cream Place 1 application rectally as needed for hemorrhoids or itching.  . levothyroxine (SYNTHROID, LEVOTHROID) 50 MCG tablet TAKE 1 TABLET BY MOUTH DAILY BEFORE BREAKFAST ON EMPTY STOMACH *BINGO CARD*  . loperamide (IMODIUM) 2 MG capsule Take 2 mg by mouth. Take one tablet orally with each loose stool as need for diarrhea * NTE 8 doses in 24 hours*  . magnesium hydroxide  (MILK OF MAGNESIA) 400 MG/5ML suspension Take by mouth daily as needed for constipation.  . metFORMIN (GLUCOPHAGE) 500 MG tablet Take  1 tablet (500 mg total) by mouth 2 (two) times daily with a meal.  . mirtazapine (REMERON) 15 MG tablet Take 15 mg by mouth at bedtime.  . Multiple Vitamins-Minerals (ICAPS MV) TABS Take 2 tablets by mouth. Take 2 capsules daily  . potassium chloride (K-DUR,KLOR-CON) 10 MEQ tablet Take 10 mEq by mouth daily. Take to prevent potassium loss (due to taking fluid pill)  . pravastatin (PRAVACHOL) 40 MG tablet Take 1 tablet (40 mg total) by mouth daily. For Cholesterol  . ranitidine (ZANTAC) 150 MG capsule Take 150 mg by mouth 2 (two) times daily.   . traMADol (ULTRAM) 50 MG tablet Take one tablet by mouth twice daily for pain  . traZODone (DESYREL) 50 MG tablet Take 50 mg by mouth at bedtime as needed.   . [DISCONTINUED] neomycin-bacitracin-polymyxin (NEOSPORIN) ointment Apply 1 application topically as needed for wound care. apply to eye   No facility-administered encounter medications on file as of 08/15/2015.  :  Review of Systems:  Out of a complete 14 point review of systems, all are reviewed and negative with the exception of these symptoms as listed below:   Review of Systems  Neurological:       [facility] has offered to do PT with patient for upper body strength. Family and patient is wondering what Dr. Guadelupe Sabin opinion is?     Objective:  Neurologic Exam  Physical Exam Physical Examination:   Filed Vitals:   08/15/15 1142  BP: 150/88  Pulse: 78  Resp: 14    General Examination: The patient is a very pleasant 80 y.o. female in no acute distress. She is calm and cooperative with the exam. She denies Auditory Hallucinations and Visual Hallucinations. She is in good spirits.  HEENT: Normocephalic, atraumatic, pupils are equal, round and reactive to light and accommodation. Extraocular tracking shows moderate saccadic breakdown without  nystagmus noted. Hearing is intact. Face is symmetric with moderate facial masking and normal facial sensation. Excess seborrhoic changes are seen on her face. There is no lip, neck or jaw tremor. Neck is moderately rigid with intact passive ROM. There are no carotid bruits on auscultation. Oropharynx exam reveals mild mouth dryness. No significant airway crowding is noted. Mallampati is class II. Tongue protrudes centrally and palate elevates symmetrically. Mild to moderate drooling is noted.  Chest: is clear to auscultation without wheezing, rhonchi or crackles noted.  Heart: sounds are regular and normal with a 1/6 early systolic murmur, no rubs or gallops noted, unchanged.   Abdomen: is soft, non-tender and non-distended with normal bowel sounds appreciated on auscultation.  Extremities: There is no edema in the lower extremities bilaterally. She is wearing compression stockings up to the knees.   Skin: is warm and dry with no trophic changes noted.   Musculoskeletal: exam reveals no obvious joint deformities, tenderness or joint swelling or erythema, with the exception of right knee pain reported and mild R medial knee swelling noted. could tell.  Neurologically:  Mental status: The patient is awake and alert, paying good attention. She is able to mostly provide the history. Her nieces provide some details. She is oriented to: person, place, time/date, situation, day of week, month of year and year. Her memory, attention, language and knowledge are impaired, mildly. There is no aphasia, agnosia, apraxia or anomia. There is a mild degree of bradyphrenia. Speech is mildly hypophonic with no dysarthria noted. Mood is congruent and affect is normal.   In May 2014: Her MMSE score was 29/30.  CDT 4/4. AFT (Animal Fluency Test) score was 14.  On 12/08/2014: MMSE:27/30, CDT: 1/4, AFT: 18/min.   On 04/10/2015: MMSE: 29/30, CDT: 4/4, AFT: 12/min.   Cranial nerves are as described above under HEENT  exam. In addition, shoulder shrug is normal with equal shoulder height noted.  Motor exam: Normal bulk, and strength for age is noted. Tone is mildly rigid with absence of cogwheeling. There is overall moderate bradykinesia. There is no drift or rebound. There is a mild bilateral upper extremity resting tremor, and no significant lower extremity tremor today.    Romberg is negative. Reflexes are 1+ in the upper extremities and 1+ in the lower extremities. Fine motor skills: Finger taps, hand movements, and rapid alternating patting are moderately impaired on the L and mild to moderately impaired on the R. Foot taps and foot agility are moderately impaired bilaterally, L worse than R.    Cerebellar testing shows no dysmetria or intention tremor on finger to nose testing. There is no truncal or gait ataxia.   Sensory exam is intact to light touch in the UEs and LEs.   Gait, station and balance: she stands with mild difficulty and pushes herself up and takes 2 attempts. She does report some right knee pain with standing. She has a moderately stooped posture. She walks fairly well with her walker. This appears to be better than last time. She turns somewhat slowly in 3 steps but has no instability when turning. Her balance is mildly to moderately impaired.  Assessment and Plan:   In summary, HARUKO MERSCH is a very pleasant 80 year old female with a history of slowness, stiffness, recurrent falls, problems with fine motor skills, her posture, as well as drooling. She has evidence of left-sided predominant Parkinson's disease vs parkinsonism, and a significant gait disorder with recurrent falls with injuries including pelvic fracture in the past. Her gait disorder is likely a function of multiple additional issues including advancing age, previous pelvic fracture, arthritis, especially affecting her knees and tendency towards dehydration. She is in assisted living at [facility] and has had some  in-house physical therapy. Thankfully, she has not fallen recently. She is advised to use her walker at all times. I suggested, she continue Sinemet 1 pill 3 times a day.  She is at risk for injury because of advanced age, history of osteoporosis and vitamin D deficiency, diabetes, advanced age and prior injuries and most importantly prior falls. I  asked her to increase her water intake, and use her walker at all times. She has had some improvement of her arthritic pain with steroid injections to both knees in the past but has some residual right knee pain. Her A1c has improved. She is going to have blood work again in November. I suggested we pursue physical therapy and occupational therapy at her assisted living facility. Her memory seems to be stable and she feels the same way. We will continue to monitor numbers as we go along.   In the past, she had testing including CTH, EEG, C. Doppler, cardiac monitor, all of which were unrevealing. I would like to see her back in about 4 months from now, sooner if the need arises.  I answered all their questions today and the patient and Kara Moore were in agreement.   I spent 25 minutes in total face-to-face time with the patient, more than 50% of which was spent in counseling and coordination of care, reviewing test results, reviewing medication and discussing or reviewing the  diagnosis of PD, its prognosis and treatment options.

## 2015-08-15 NOTE — Patient Instructions (Signed)
We will continue the Sinemet 25/100 mg 3 times a day. Your memory is stable. Try to drink enough water, about 6-8 glasses. We will do some PT and OT at [facility]. Follow up in 4 months.

## 2015-08-16 DIAGNOSIS — M6281 Muscle weakness (generalized): Secondary | ICD-10-CM | POA: Diagnosis not present

## 2015-08-18 DIAGNOSIS — Z23 Encounter for immunization: Secondary | ICD-10-CM | POA: Diagnosis not present

## 2015-08-18 DIAGNOSIS — M6281 Muscle weakness (generalized): Secondary | ICD-10-CM | POA: Diagnosis not present

## 2015-08-21 DIAGNOSIS — M6281 Muscle weakness (generalized): Secondary | ICD-10-CM | POA: Diagnosis not present

## 2015-08-21 DIAGNOSIS — R2689 Other abnormalities of gait and mobility: Secondary | ICD-10-CM | POA: Diagnosis not present

## 2015-08-21 DIAGNOSIS — R279 Unspecified lack of coordination: Secondary | ICD-10-CM | POA: Diagnosis not present

## 2015-08-22 DIAGNOSIS — R279 Unspecified lack of coordination: Secondary | ICD-10-CM | POA: Diagnosis not present

## 2015-08-22 DIAGNOSIS — M6281 Muscle weakness (generalized): Secondary | ICD-10-CM | POA: Diagnosis not present

## 2015-08-22 DIAGNOSIS — R2689 Other abnormalities of gait and mobility: Secondary | ICD-10-CM | POA: Diagnosis not present

## 2015-08-23 DIAGNOSIS — R2689 Other abnormalities of gait and mobility: Secondary | ICD-10-CM | POA: Diagnosis not present

## 2015-08-23 DIAGNOSIS — M6281 Muscle weakness (generalized): Secondary | ICD-10-CM | POA: Diagnosis not present

## 2015-08-23 DIAGNOSIS — R279 Unspecified lack of coordination: Secondary | ICD-10-CM | POA: Diagnosis not present

## 2015-08-24 DIAGNOSIS — M6281 Muscle weakness (generalized): Secondary | ICD-10-CM | POA: Diagnosis not present

## 2015-08-24 DIAGNOSIS — R2689 Other abnormalities of gait and mobility: Secondary | ICD-10-CM | POA: Diagnosis not present

## 2015-08-24 DIAGNOSIS — R279 Unspecified lack of coordination: Secondary | ICD-10-CM | POA: Diagnosis not present

## 2015-08-25 DIAGNOSIS — R279 Unspecified lack of coordination: Secondary | ICD-10-CM | POA: Diagnosis not present

## 2015-08-25 DIAGNOSIS — R2689 Other abnormalities of gait and mobility: Secondary | ICD-10-CM | POA: Diagnosis not present

## 2015-08-25 DIAGNOSIS — M6281 Muscle weakness (generalized): Secondary | ICD-10-CM | POA: Diagnosis not present

## 2015-08-29 DIAGNOSIS — R279 Unspecified lack of coordination: Secondary | ICD-10-CM | POA: Diagnosis not present

## 2015-08-29 DIAGNOSIS — M6281 Muscle weakness (generalized): Secondary | ICD-10-CM | POA: Diagnosis not present

## 2015-08-29 DIAGNOSIS — R2689 Other abnormalities of gait and mobility: Secondary | ICD-10-CM | POA: Diagnosis not present

## 2015-08-30 DIAGNOSIS — R279 Unspecified lack of coordination: Secondary | ICD-10-CM | POA: Diagnosis not present

## 2015-08-30 DIAGNOSIS — M6281 Muscle weakness (generalized): Secondary | ICD-10-CM | POA: Diagnosis not present

## 2015-08-30 DIAGNOSIS — R2689 Other abnormalities of gait and mobility: Secondary | ICD-10-CM | POA: Diagnosis not present

## 2015-08-31 DIAGNOSIS — R2689 Other abnormalities of gait and mobility: Secondary | ICD-10-CM | POA: Diagnosis not present

## 2015-08-31 DIAGNOSIS — M6281 Muscle weakness (generalized): Secondary | ICD-10-CM | POA: Diagnosis not present

## 2015-08-31 DIAGNOSIS — R279 Unspecified lack of coordination: Secondary | ICD-10-CM | POA: Diagnosis not present

## 2015-09-01 DIAGNOSIS — M6281 Muscle weakness (generalized): Secondary | ICD-10-CM | POA: Diagnosis not present

## 2015-09-01 DIAGNOSIS — R279 Unspecified lack of coordination: Secondary | ICD-10-CM | POA: Diagnosis not present

## 2015-09-01 DIAGNOSIS — R2689 Other abnormalities of gait and mobility: Secondary | ICD-10-CM | POA: Diagnosis not present

## 2015-09-04 DIAGNOSIS — R2689 Other abnormalities of gait and mobility: Secondary | ICD-10-CM | POA: Diagnosis not present

## 2015-09-04 DIAGNOSIS — R279 Unspecified lack of coordination: Secondary | ICD-10-CM | POA: Diagnosis not present

## 2015-09-04 DIAGNOSIS — M6281 Muscle weakness (generalized): Secondary | ICD-10-CM | POA: Diagnosis not present

## 2015-09-05 DIAGNOSIS — R279 Unspecified lack of coordination: Secondary | ICD-10-CM | POA: Diagnosis not present

## 2015-09-05 DIAGNOSIS — R2689 Other abnormalities of gait and mobility: Secondary | ICD-10-CM | POA: Diagnosis not present

## 2015-09-05 DIAGNOSIS — M6281 Muscle weakness (generalized): Secondary | ICD-10-CM | POA: Diagnosis not present

## 2015-09-06 DIAGNOSIS — R2689 Other abnormalities of gait and mobility: Secondary | ICD-10-CM | POA: Diagnosis not present

## 2015-09-06 DIAGNOSIS — R279 Unspecified lack of coordination: Secondary | ICD-10-CM | POA: Diagnosis not present

## 2015-09-06 DIAGNOSIS — M6281 Muscle weakness (generalized): Secondary | ICD-10-CM | POA: Diagnosis not present

## 2015-09-07 DIAGNOSIS — R279 Unspecified lack of coordination: Secondary | ICD-10-CM | POA: Diagnosis not present

## 2015-09-07 DIAGNOSIS — M6281 Muscle weakness (generalized): Secondary | ICD-10-CM | POA: Diagnosis not present

## 2015-09-07 DIAGNOSIS — R2689 Other abnormalities of gait and mobility: Secondary | ICD-10-CM | POA: Diagnosis not present

## 2015-09-08 DIAGNOSIS — R279 Unspecified lack of coordination: Secondary | ICD-10-CM | POA: Diagnosis not present

## 2015-09-08 DIAGNOSIS — R2689 Other abnormalities of gait and mobility: Secondary | ICD-10-CM | POA: Diagnosis not present

## 2015-09-08 DIAGNOSIS — M6281 Muscle weakness (generalized): Secondary | ICD-10-CM | POA: Diagnosis not present

## 2015-09-11 DIAGNOSIS — R279 Unspecified lack of coordination: Secondary | ICD-10-CM | POA: Diagnosis not present

## 2015-09-11 DIAGNOSIS — M6281 Muscle weakness (generalized): Secondary | ICD-10-CM | POA: Diagnosis not present

## 2015-09-11 DIAGNOSIS — R2689 Other abnormalities of gait and mobility: Secondary | ICD-10-CM | POA: Diagnosis not present

## 2015-09-12 DIAGNOSIS — M6281 Muscle weakness (generalized): Secondary | ICD-10-CM | POA: Diagnosis not present

## 2015-09-12 DIAGNOSIS — R2689 Other abnormalities of gait and mobility: Secondary | ICD-10-CM | POA: Diagnosis not present

## 2015-09-12 DIAGNOSIS — R279 Unspecified lack of coordination: Secondary | ICD-10-CM | POA: Diagnosis not present

## 2015-09-13 DIAGNOSIS — M6281 Muscle weakness (generalized): Secondary | ICD-10-CM | POA: Diagnosis not present

## 2015-09-13 DIAGNOSIS — R2689 Other abnormalities of gait and mobility: Secondary | ICD-10-CM | POA: Diagnosis not present

## 2015-09-13 DIAGNOSIS — R279 Unspecified lack of coordination: Secondary | ICD-10-CM | POA: Diagnosis not present

## 2015-09-15 DIAGNOSIS — R2689 Other abnormalities of gait and mobility: Secondary | ICD-10-CM | POA: Diagnosis not present

## 2015-09-15 DIAGNOSIS — M6281 Muscle weakness (generalized): Secondary | ICD-10-CM | POA: Diagnosis not present

## 2015-09-15 DIAGNOSIS — R279 Unspecified lack of coordination: Secondary | ICD-10-CM | POA: Diagnosis not present

## 2015-09-18 DIAGNOSIS — R2689 Other abnormalities of gait and mobility: Secondary | ICD-10-CM | POA: Diagnosis not present

## 2015-09-18 DIAGNOSIS — R279 Unspecified lack of coordination: Secondary | ICD-10-CM | POA: Diagnosis not present

## 2015-09-18 DIAGNOSIS — M6281 Muscle weakness (generalized): Secondary | ICD-10-CM | POA: Diagnosis not present

## 2015-09-19 DIAGNOSIS — R279 Unspecified lack of coordination: Secondary | ICD-10-CM | POA: Diagnosis not present

## 2015-09-19 DIAGNOSIS — M6281 Muscle weakness (generalized): Secondary | ICD-10-CM | POA: Diagnosis not present

## 2015-09-19 DIAGNOSIS — R2689 Other abnormalities of gait and mobility: Secondary | ICD-10-CM | POA: Diagnosis not present

## 2015-09-20 DIAGNOSIS — M6281 Muscle weakness (generalized): Secondary | ICD-10-CM | POA: Diagnosis not present

## 2015-09-20 DIAGNOSIS — R2689 Other abnormalities of gait and mobility: Secondary | ICD-10-CM | POA: Diagnosis not present

## 2015-09-20 DIAGNOSIS — R279 Unspecified lack of coordination: Secondary | ICD-10-CM | POA: Diagnosis not present

## 2015-09-21 DIAGNOSIS — R2689 Other abnormalities of gait and mobility: Secondary | ICD-10-CM | POA: Diagnosis not present

## 2015-09-21 DIAGNOSIS — M6281 Muscle weakness (generalized): Secondary | ICD-10-CM | POA: Diagnosis not present

## 2015-09-21 DIAGNOSIS — R279 Unspecified lack of coordination: Secondary | ICD-10-CM | POA: Diagnosis not present

## 2015-09-22 DIAGNOSIS — M6281 Muscle weakness (generalized): Secondary | ICD-10-CM | POA: Diagnosis not present

## 2015-09-22 DIAGNOSIS — R279 Unspecified lack of coordination: Secondary | ICD-10-CM | POA: Diagnosis not present

## 2015-09-22 DIAGNOSIS — R2689 Other abnormalities of gait and mobility: Secondary | ICD-10-CM | POA: Diagnosis not present

## 2015-09-25 DIAGNOSIS — M6281 Muscle weakness (generalized): Secondary | ICD-10-CM | POA: Diagnosis not present

## 2015-09-25 DIAGNOSIS — R279 Unspecified lack of coordination: Secondary | ICD-10-CM | POA: Diagnosis not present

## 2015-09-25 DIAGNOSIS — R2689 Other abnormalities of gait and mobility: Secondary | ICD-10-CM | POA: Diagnosis not present

## 2015-09-26 ENCOUNTER — Other Ambulatory Visit: Payer: Medicare Other

## 2015-09-26 DIAGNOSIS — E039 Hypothyroidism, unspecified: Secondary | ICD-10-CM | POA: Diagnosis not present

## 2015-09-26 DIAGNOSIS — E1149 Type 2 diabetes mellitus with other diabetic neurological complication: Secondary | ICD-10-CM | POA: Diagnosis not present

## 2015-09-27 DIAGNOSIS — R279 Unspecified lack of coordination: Secondary | ICD-10-CM | POA: Diagnosis not present

## 2015-09-27 DIAGNOSIS — R2689 Other abnormalities of gait and mobility: Secondary | ICD-10-CM | POA: Diagnosis not present

## 2015-09-27 DIAGNOSIS — M6281 Muscle weakness (generalized): Secondary | ICD-10-CM | POA: Diagnosis not present

## 2015-09-27 LAB — BASIC METABOLIC PANEL
BUN/Creatinine Ratio: 15 (ref 11–26)
BUN: 11 mg/dL (ref 8–27)
CO2: 24 mmol/L (ref 18–29)
Calcium: 9.8 mg/dL (ref 8.7–10.3)
Chloride: 98 mmol/L (ref 97–106)
Creatinine, Ser: 0.71 mg/dL (ref 0.57–1.00)
GFR calc Af Amer: 94 mL/min/{1.73_m2} (ref >59)
GFR calc non Af Amer: 82 mL/min/{1.73_m2} (ref >59)
Glucose: 143 mg/dL — ABNORMAL HIGH (ref 65–99)
Potassium: 4.5 mmol/L (ref 3.5–5.2)
Sodium: 138 mmol/L (ref 136–144)

## 2015-09-27 LAB — CBC WITH DIFFERENTIAL/PLATELET
Basophils Absolute: 0 10*3/uL (ref 0.0–0.2)
Basos: 0 %
EOS (ABSOLUTE): 0.1 10*3/uL (ref 0.0–0.4)
Eos: 2 %
Hematocrit: 43.7 % (ref 34.0–46.6)
Hemoglobin: 14.6 g/dL (ref 11.1–15.9)
Immature Grans (Abs): 0 10*3/uL (ref 0.0–0.1)
Immature Granulocytes: 0 %
Lymphocytes Absolute: 1.2 10*3/uL (ref 0.7–3.1)
Lymphs: 19 %
MCH: 30.8 pg (ref 26.6–33.0)
MCHC: 33.4 g/dL (ref 31.5–35.7)
MCV: 92 fL (ref 79–97)
Monocytes Absolute: 0.4 10*3/uL (ref 0.1–0.9)
Monocytes: 7 %
Neutrophils Absolute: 4.4 10*3/uL (ref 1.4–7.0)
Neutrophils: 72 %
Platelets: 259 10*3/uL (ref 150–379)
RBC: 4.74 x10E6/uL (ref 3.77–5.28)
RDW: 13.6 % (ref 12.3–15.4)
WBC: 6.1 10*3/uL (ref 3.4–10.8)

## 2015-09-27 LAB — TSH: TSH: 4.2 u[IU]/mL (ref 0.450–4.500)

## 2015-09-27 LAB — HEMOGLOBIN A1C
Est. average glucose Bld gHb Est-mCnc: 148 mg/dL
Hgb A1c MFr Bld: 6.8 % — ABNORMAL HIGH (ref 4.8–5.6)

## 2015-09-28 ENCOUNTER — Other Ambulatory Visit: Payer: Medicare Other

## 2015-09-28 DIAGNOSIS — R279 Unspecified lack of coordination: Secondary | ICD-10-CM | POA: Diagnosis not present

## 2015-09-28 DIAGNOSIS — M6281 Muscle weakness (generalized): Secondary | ICD-10-CM | POA: Diagnosis not present

## 2015-09-28 DIAGNOSIS — R2689 Other abnormalities of gait and mobility: Secondary | ICD-10-CM | POA: Diagnosis not present

## 2015-10-02 ENCOUNTER — Encounter: Payer: Self-pay | Admitting: Internal Medicine

## 2015-10-02 ENCOUNTER — Telehealth: Payer: Self-pay

## 2015-10-02 ENCOUNTER — Other Ambulatory Visit: Payer: Self-pay

## 2015-10-02 ENCOUNTER — Ambulatory Visit (INDEPENDENT_AMBULATORY_CARE_PROVIDER_SITE_OTHER): Payer: Medicare Other | Admitting: Internal Medicine

## 2015-10-02 VITALS — BP 126/82 | HR 67 | Temp 97.8°F | Resp 20 | Ht 63.0 in | Wt 151.0 lb

## 2015-10-02 DIAGNOSIS — E1142 Type 2 diabetes mellitus with diabetic polyneuropathy: Secondary | ICD-10-CM

## 2015-10-02 DIAGNOSIS — I10 Essential (primary) hypertension: Secondary | ICD-10-CM

## 2015-10-02 DIAGNOSIS — I872 Venous insufficiency (chronic) (peripheral): Secondary | ICD-10-CM

## 2015-10-02 DIAGNOSIS — M170 Bilateral primary osteoarthritis of knee: Secondary | ICD-10-CM

## 2015-10-02 DIAGNOSIS — E039 Hypothyroidism, unspecified: Secondary | ICD-10-CM

## 2015-10-02 DIAGNOSIS — G20 Parkinson's disease: Secondary | ICD-10-CM | POA: Diagnosis not present

## 2015-10-02 DIAGNOSIS — Z23 Encounter for immunization: Secondary | ICD-10-CM | POA: Diagnosis not present

## 2015-10-02 DIAGNOSIS — Z780 Asymptomatic menopausal state: Secondary | ICD-10-CM

## 2015-10-02 DIAGNOSIS — G20A1 Parkinson's disease without dyskinesia, without mention of fluctuations: Secondary | ICD-10-CM

## 2015-10-02 MED ORDER — ZOSTER VACCINE LIVE 19400 UNT/0.65ML ~~LOC~~ SOLR: 0.6500 mL | Freq: Once | SUBCUTANEOUS | Status: DC

## 2015-10-02 NOTE — Telephone Encounter (Signed)
Called spoke with some one at [facility] asked them for the last records of patients Bone Density test they asked to have consent form sent to them first, I have mailed consent form to patient to be filled out and signed, so we can fax it back to the Center.

## 2015-10-02 NOTE — Progress Notes (Signed)
Patient ID: Kara Moore, female   DOB: [birth year], 80 y.o.   MRN: [id number]   Location: [facility] Provider: Gwenith Spitz. Renato Gails, D.O., C.M.D.  Code Status: DNR Goals of Care: Advanced Directive information Does patient have an advance directive?: Yes  Chief Complaint  Patient presents with  . Medical Management of Chronic Issues    4 month follow-up for DM, Hypertension, Hypothyroidism    HPI: Patient is a 80 y.o. female seen in the office today for med mgt of chronic diseases.  She says she is fine.  Labs reviewed and looking great.   DMII:  hba1c stable at 6.8  OT done per Dr. Teofilo Pod recommendation.  She had been walking a lot with therapy and she says they told her she could go w/o the walker, but she was in a lot of pain and tired afterward.    BP at goal with current meds.  No dizziness.  Right knee OA with bone spur greater than left with pain:  Refuses to go back to ortho just yet for another shot.    PD:  Continues to walk with walker when when wants to and not use it other times despite multiple physicians' recommendations.  Continues on sinemet.  Chronic venous insufficiency:  Continues compression hose and 1/2 tab lasix.  Insomnia:  Sleeps too much she says.  Falls asleep easily.  Has not used trazodone on MAR provided by [employer].  Review of Systems:  Review of Systems  Constitutional: Negative for fever and chills.  HENT: Positive for hearing loss. Negative for congestion.   Eyes: Positive for blurred vision.  Respiratory: Negative for shortness of breath.   Cardiovascular: Negative for chest pain and leg swelling.  Gastrointestinal: Negative for abdominal pain, constipation, blood in stool and melena.  Genitourinary: Positive for urgency and frequency.  Musculoskeletal: Positive for joint pain. Negative for falls.  Skin: Negative for rash.  Neurological: Positive for tremors. Negative for dizziness.       Parkinson's  Psychiatric/Behavioral:  Positive for memory loss. Negative for depression.    Past Medical History  Diagnosis Date  . Benign essential hypertension   . Diabetes mellitus without complication (HCC)   . Hyperlipidemia LDL goal < 100   . Hypothyroidism   . Memory loss   . Confusion   . Senile osteoporosis   . Macular degeneration     Past Surgical History  Procedure Laterality Date  . Adenoidectomy  2011    No Known Allergies    Medication List       This list is accurate as of: 10/02/15 10:28 AM.  Always use your most recent med list.               acetaminophen 500 MG tablet  Commonly known as:  TYLENOL  Take 500 mg by mouth. (STANDING ORDER) take 1 tablet evert 4 hours as need for fever up to 101.  headach and or minor discomfort.  ( don not exceed 2000 mg)  **MAX DOSE IS 3,000/24 HRS FROM ALL SOURCES88     aspirin 81 MG chewable tablet  Chew 81 mg by mouth daily.     calcium carbonate 1250 (500 CA) MG tablet  Commonly known as:  OS-CAL - dosed in mg of elemental calcium  Take 1 tablet by mouth.     carbidopa-levodopa 25-100 MG tablet  Commonly known as:  SINEMET IR  Take 1 tablet by mouth 3 (three) times daily. Follow titration instructions provided. For  Parkinson's Disease     Cholecalciferol 2000 UNITS Caps  Take 1 capsule (2,000 Units total) by mouth daily.     fluticasone 50 MCG/ACT nasal spray  Commonly known as:  FLONASE  Place 2 sprays into the nose as needed.     furosemide 20 MG tablet  Commonly known as:  LASIX  Take 1/2 tablet orally every day     GERI-LANTA PO  Take 30 mLs by mouth as needed. For pain     glipiZIDE 5 MG tablet  Commonly known as:  GLUCOTROL  Take 5 mg by mouth daily before breakfast. For Diabetes (elevated blood sugar)     guaiFENesin 100 MG/5ML liquid  Commonly known as:  ROBITUSSIN  Take 100 mg by mouth. (Standing Order) 2 tsp ( 10cc) orally evry six hours as need for co;ugh**NOT TO EXCEED 4 DOSES     HYDROcodone-acetaminophen 5-325 MG  tablet  Commonly known as:  NORCO  Take one tablet by mouth every 6 hours as needed for severe pain. Do not exceed 3g of APAP per day     hydrocortisone 2.5 % rectal cream  Commonly known as:  ANUSOL-HC  Place 1 application rectally as needed for hemorrhoids or itching.     ICAPS MV Tabs  Take 2 tablets by mouth. Take 2 capsules daily     levothyroxine 50 MCG tablet  Commonly known as:  SYNTHROID, LEVOTHROID  TAKE 1 TABLET BY MOUTH DAILY BEFORE BREAKFAST ON EMPTY STOMACH *BINGO CARD*     loperamide 2 MG capsule  Commonly known as:  IMODIUM  Take 2 mg by mouth. Take one tablet orally with each loose stool as need for diarrhea * NTE 8 doses in 24 hours*     magnesium hydroxide 400 MG/5ML suspension  Commonly known as:  MILK OF MAGNESIA  Take by mouth daily as needed for constipation.     metFORMIN 500 MG tablet  Commonly known as:  GLUCOPHAGE  Take 1 tablet (500 mg total) by mouth 2 (two) times daily with a meal.     mirtazapine 15 MG tablet  Commonly known as:  REMERON  Take 15 mg by mouth at bedtime.     potassium chloride 10 MEQ tablet  Commonly known as:  K-DUR,KLOR-CON  Take 10 mEq by mouth daily. Take to prevent potassium loss (due to taking fluid pill)     pravastatin 40 MG tablet  Commonly known as:  PRAVACHOL  Take 1 tablet (40 mg total) by mouth daily. For Cholesterol     ranitidine 150 MG capsule  Commonly known as:  ZANTAC  Take 150 mg by mouth 2 (two) times daily.     traMADol 50 MG tablet  Commonly known as:  ULTRAM  Take one tablet by mouth twice daily for pain     traZODone 50 MG tablet  Commonly known as:  DESYREL  Take 50 mg by mouth at bedtime as needed.        Health Maintenance  Topic Date Due  . ZOSTAVAX  11/22/1996  . DEXA SCAN  11/22/2001  . TETANUS/TDAP  02/16/2014  . INFLUENZA VACCINE  06/19/2015  . FOOT EXAM  07/22/2015  . OPHTHALMOLOGY EXAM  10/11/2015  . URINE MICROALBUMIN  10/29/2015  . HEMOGLOBIN A1C  03/25/2016  . PNA vac  Low Risk Adult  Completed    Physical Exam: Filed Vitals:   10/02/15 1011  BP: 126/82  Pulse: 67  Temp: 97.8 F (36.6 C)  TempSrc: Oral  Resp:  20  Height:  (1.6 m)  Weight: 151 lb (68.493 kg)  SpO2: 96%   Body mass index is 26.76 kg/(m^2). Physical Exam  Constitutional: She is oriented to person, place, and time. She appears well-developed and well-nourished. No distress.  Cardiovascular: Normal rate, regular rhythm, normal heart sounds and intact distal pulses.   Pulmonary/Chest: Effort normal and breath sounds normal. No respiratory distress.  Abdominal: Bowel sounds are normal.  Neurological: She is alert and oriented to person, place, and time.  Skin: Skin is warm and dry.  Diabetic foot exam done--to get RN who specializes in diabetic foot care to come and trim long toenails that are causing some pain in her shoes    Labs reviewed: Basic Metabolic Panel:  Recent Labs  45/40/98 0034 01/24/15 0913 05/30/15 0842 09/26/15 0850  NA 139 138  --  138  K 3.8 4.6  --  4.5  CL 105 98  --  98  CO2 28 23  --  24  GLUCOSE 174* 176*  --  143*  BUN 10 10  --  11  CREATININE 0.73 0.80  --  0.71  CALCIUM 8.8 9.3  --  9.8  TSH  --  3.020 2.850 4.200   Liver Function Tests:  Recent Labs  11/12/14 0034  AST 22  ALT 10*  ALKPHOS 81  BILITOT 0.4  PROT 6.9  ALBUMIN 3.3*   No results for input(s): LIPASE, AMYLASE in the last 8760 hours. No results for input(s): AMMONIA in the last 8760 hours. CBC:  Recent Labs  11/12/14 0034 01/24/15 0913 05/30/15 0842 09/26/15 0850  WBC 6.9 6.9 6.3 6.1  NEUTROABS  --  4.9 4.1 4.4  HGB 13.6 14.6  --   --   HCT 41.1 44.9 43.4 43.7  MCV 91 92  --   --   PLT 252  --   --   --    Lipid Panel:  Recent Labs  10/24/14 0920 05/30/15 0842  CHOL 169 166  HDL 61 65  LDLCALC 93 82  TRIG 74 94  CHOLHDL 2.8 2.6   Lab Results  Component Value Date   HGBA1C 6.8* 09/26/2015    Procedures since last visit: Need to get  record of bone density  Assessment/Plan 1. Parkinson's disease (tremor, stiffness, slow motion, unstable posture) (HCC) -cont sinemet, walker use and f/u with Dr. Frances Furbish -doing well from this perspective recently  2. Primary osteoarthritis of both knees -right worse than left, cont ambulation with walker and recommended she go back for a knee injection at ortho due to worsening pain, but she says she's not ready yet  3. Controlled type 2 diabetes mellitus with diabetic polyneuropathy, without long-term current use of insulin (HCC) - well controlled with diet, oral agents, hba1c 6.8 - Hemoglobin A1c; Future - Basic metabolic panel; Future - Lipid panel; Future  4. Essential hypertension, benign -bp well controlled with current agents, no changes, no dizziness  5. Chronic venous insufficiency -well controlled with compression hose and 1/2 tab lasix, electrolytes stable -no changes  6. Hypothyroidism, unspecified hypothyroidism type -last tsh wnl, cont current synthroid as above  7. Need for zoster vaccination -she doesn't think she's going to get this despite recs by myself and Dr. Frances Furbish to do so - zoster vaccine live, PF, (ZOSTAVAX) 11914 UNT/0.65ML injection; Inject 19,400 Units into the skin once.  Dispense: 1 each; Refill: 0  Labs/tests ordered:   Orders Placed This Encounter  Procedures  . Hemoglobin A1c  Standing Status: Future     Number of Occurrences:      Standing Expiration Date: 05/31/2016  . Basic metabolic panel    Standing Status: Future     Number of Occurrences:      Standing Expiration Date: 05/31/2016    Order Specific Question:  Has the patient fasted?    Answer:  Yes  . Lipid panel    Standing Status: Future     Number of Occurrences:      Standing Expiration Date: 05/31/2016    Order Specific Question:  Has the patient fasted?    Answer:  Yes    Next appt:  01/31/2016 med mgt, labs before   Jasaiah Karwowski L. Kalifa Cadden, D.O. Geriatrics [facility] [address], [place] [address] Cell Phone (Mon-Fri 8am-5pm):  [phone] On Call:  [phone] & follow prompts after 5pm & weekends Office Phone:  [phone] Office Fax:  [phone]

## 2015-10-18 DIAGNOSIS — H353130 Nonexudative age-related macular degeneration, bilateral, stage unspecified: Secondary | ICD-10-CM | POA: Diagnosis not present

## 2015-10-18 LAB — HM DIABETES EYE EXAM

## 2015-10-25 NOTE — Telephone Encounter (Signed)
error 

## 2015-10-27 ENCOUNTER — Encounter: Payer: Self-pay | Admitting: *Deleted

## 2015-11-02 ENCOUNTER — Telehealth: Payer: Self-pay | Admitting: *Deleted

## 2015-11-02 NOTE — Telephone Encounter (Signed)
Received fax from [facility] for Dr. Renato Gailseed to review and sign FL2 Form, Physician's orders, Diet order and standing orders for patient. To be faxed back to [facility] Fax#: [phone]

## 2015-12-20 ENCOUNTER — Ambulatory Visit (INDEPENDENT_AMBULATORY_CARE_PROVIDER_SITE_OTHER): Payer: Medicare Other | Admitting: Neurology

## 2015-12-20 ENCOUNTER — Encounter: Payer: Self-pay | Admitting: Neurology

## 2015-12-20 VITALS — BP 134/74 | HR 76 | Resp 16 | Ht 63.0 in | Wt 153.0 lb

## 2015-12-20 DIAGNOSIS — Z9181 History of falling: Secondary | ICD-10-CM | POA: Diagnosis not present

## 2015-12-20 DIAGNOSIS — R296 Repeated falls: Secondary | ICD-10-CM | POA: Diagnosis not present

## 2015-12-20 DIAGNOSIS — G20 Parkinson's disease: Secondary | ICD-10-CM

## 2015-12-20 DIAGNOSIS — R269 Unspecified abnormalities of gait and mobility: Secondary | ICD-10-CM | POA: Diagnosis not present

## 2015-12-20 NOTE — Progress Notes (Signed)
Subjective:    Patient ID: Kara Moore is a 81 y.o. female.  HPI     Interim history:   Kara Moore is a very pleasant 81 year old right-handed woman with an underlying medical history of diabetes, hyperlipidemia, hypothyroidism, vitamin D deficiency, reflux disease, and hypertension and osteoporosis who presents for followup consultation of her gait disorder and parkinsonism, probable L sided predominant PD, complicated by recurrent falls. She is accompanied by her niece, Kara Moore, again today. I last saw her on 08/15/2015, at which time she was doing fairly well, her niece agreed. She had no recent falls. She was using her walker. Her diabetes numbers had improved and her memory was stable. She felt that her appetite was good but she does not always drink enough water. She was advised to continue with Sinemet 3 times a day and use her walker at all times. She was encouraged to drink more water. I ordered physical therapy and occupational therapy at [facility].  Today, 12/20/2015: She reports that she has more eye problems, hazy, no diplopia, no visual loss, had eye exam on 10/18/15 and had a stable eye exam, she had stable mild dry macular degeneration and stable mild cataracts, and glasses were unchanged. I reviewed the MAR: she takes tramadol 50 mg bid, remeron 15 mg at night, trazodone 50 mg, C/L 3 times a day, written for 8 AM, 2 PM and 8 PM. She has some knee pain. Thankfully, she has not fallen.   Previously:  I saw her on 04/10/2015, at which time she reported right knee pain. She did not go back to orthopedics. She was seen by her PCP recently. She was still residing at [facility] assisted living. She had occasional left arm and hand tremor, especially worse when tired. She had no recent falls. Her latest A1c in March 2016 was 8.7. She had left eye surgery for removal of the chalazion. Kara Moore had no new concerns. Her memory was felt to be stable. Her MMSE was 29/30, CDT: 4/4, AFT:  12/min. I suggested she continue with Sinemet 1 pill 3 times a day.   I saw her on 12/08/2014, at which time her niece reported several falls. She was drooling more. She had knee pain and swelling. She had gone to the emergency room. She had seen orthopedics and received injections into both knees which improved her pain significantly. They were considering Synvisc injections. She had had some therapy at [facility]. She was not drinking enough water. She was using a 2 wheeled walker more consistently. She went to [facility] and had x-rays of her knees and ultrasound of her legs which did not show any clots per Kara Moore. I suggested we increase her Sinemet to 1 pill 3 times a day. I asked her to use her 2 wheeled walker consistently.   I saw her on 06/10/2014, at which time Kara Moore reported that they did not fill the order for the walker. The patient had a steroid injection into the right knee which helped marginally. She was reporting more knee pain on the left. She had an x-ray of her right knee. I asked her to use a rolling walker at all times for gait safety. We talked about her gait disorder and the complexity of her situation. I continued low-dose Sinemet.  I saw her on 01/18/2014, at which time we talked about her using her walker at all times. She was on Remeron for sleep. I prescribed a rolling walker with seat. She indicated that  she would not want to use a walker.   I saw her on 09/09/2013, at which time I felt that her gait disorder with recurrent falls was out of proportion to her parkinsonism. I felt that her gait disorder was a function of advancing age, previous pelvic fracture, and arthritis. She had finished inpatient rehabilitation at the time. We talked about her recent additional testing including head CT, EEG, carotid Doppler study, cardiac monitor, all of which were unrevealing. She was advised to continue using her 2 wheeled walker at all times.   I saw her on  06/17/2013 after a recent fall and suggested further workup for a recent episode of confusion and her recurrent falls. I ordered CTH, EEG, C. Doppler, cardiac monitor and suggested a referral to geriatrics. She has since then started seeing Dr. Mariea Clonts.   Her head CT from 06/22/2013 was negative for any acute findings.   Her EEG from 06/25/2013 was normal in the awake and sleep states.   Her carotid Doppler study from 06/25/2013 was negative for any significant stenoses.   Her cardiac event monitor from 07/01/2013 through 07/30/2013 was consistent with normal sinus rhythm with occasional PVCs, no evidence of atrial fibrillation. This was interpreted by Dr. Dola Argyle.   She had an Echocardiogram on 04/15/13 at the [facility] in [place]. She has arthritis, in her R knee and had 2 injections before.   She lives at [facility] in [place]. She feels like "I have legs again". She is tolerating the C/L 25/100 mg 1/2 tid.   I first met her on 03/26/2013 at which time her niece and the patient reported a approximately two-month history of decrease in fine motor skills, decrease in mobility, gait changes and recurrent falls. She reported becoming slower than usual taking longer to dress herself, needing assistance with some of her ADLs. She was started on C/L at the time. She has had urinary urgency. She recently had physical therapy and her therapist noted lack of ability, inability to dress herself and drooling. The patient had to give up driving.   She had a MRI brain on 04/08/13: Abnormal MRI scan of the brain showing mild changes of age-appropriate chronic microvascular ischemia and generalized cerebral atrophy.   On 02/11/13 she fell out of her bed and had difficulty getting back up. Her nephew was visiting from [place], but he could not hear her, as he is hard of hearing and she was calling in a soft voice. She developed rhabdomyolysis. In the latter part of April she was taken to [facility] in [place],  then to the ER for suspected cellulitis. She had an US of the LEs and did not have a blood clot. She was given lasix for swelling. The next day she fell, and then again, and then again the next day. After these falls, she started having care 24/7 through [facility]. She has been in PT since the end of April. She has been using a 2 wheeled walker since then.   She has a strong FHx of tremors on her mother's side (including M, MGM and cousins). Both of her siblings passed away.   She never had a TIA or a stroke.   She fell at home on 04/30/13, while coming out of the bathroom. She has been having issues with constipation and had taken Dulcolax suppositories, but also took Mag citrate and her niece found her on the floor and she had soiled herself and for some reason had not pushed her  Lifeline button. She denied hitting her head or LOC. Her niece called 35 and she was taken to [facility] to the ER and was discharged to home from there. Then she was admitted to inpt rehab at [facility] center on 05/02/13. She fell while in rehab and was found to have a pelvic Fx b/l, reportedly old. She has had some confusion intermittently.    Her Past Medical History Is Significant For: Past Medical History  Diagnosis Date  . Benign essential hypertension   . Diabetes mellitus without complication ([place])   . Hyperlipidemia LDL goal < 100   . Hypothyroidism   . Memory loss   . Confusion   . Senile osteoporosis   . Macular degeneration     Her Past Surgical History Is Significant For: Past Surgical History  Procedure Laterality Date  . Adenoidectomy  2011    Her Family History Is Significant For: Family History  Problem Relation Age of Onset  . Tremor Maternal Grandmother   . Diabetes Paternal Grandfather   . Heart attack Father   . Cancer Sister   . Heart attack Brother   . Diabetes Brother   . Tremor Maternal Aunt   . Diabetes Paternal Grandmother     Her Social History Is  Significant For: Social History   Social History  . Marital Status: Single    Spouse Name: N/A  . Number of Children: N/A  . Years of Education: N/A   Social History Main Topics  . Smoking status: Never Smoker   . Smokeless tobacco: None  . Alcohol Use: No  . Drug Use: No  . Sexual Activity: Not Asked   Other Topics Concern  . None   Social History Narrative    Her Allergies Are:  No Known Allergies:   Her Current Medications Are:  Outpatient Encounter Prescriptions as of 12/20/2015  Medication Sig  . acetaminophen (TYLENOL) 500 MG tablet Take 500 mg by mouth. (STANDING ORDER) take 1 tablet evert 4 hours as need for fever up to 101.  headach and or minor discomfort.  ( don not exceed 2000 mg)  **MAX DOSE IS 3,000/24 HRS FROM ALL SOURCES88  . Alum & Mag Hydroxide-Simeth (GERI-LANTA PO) Take 30 mLs by mouth as needed. For pain  . aspirin 81 MG chewable tablet Chew 81 mg by mouth daily.   . calcium carbonate (OS-CAL - DOSED IN MG OF ELEMENTAL CALCIUM) 1250 (500 CA) MG tablet Take 1 tablet by mouth.  . carbidopa-levodopa (SINEMET IR) 25-100 MG per tablet Take 1 tablet by mouth 3 (three) times daily. Follow titration instructions provided. For Parkinson's Disease  . Cholecalciferol 2000 UNITS CAPS Take 1 capsule (2,000 Units total) by mouth daily.  . fluticasone (FLONASE) 50 MCG/ACT nasal spray Place 2 sprays into the nose as needed.   . furosemide (LASIX) 20 MG tablet Take 1/2 tablet orally every day  . glipiZIDE (GLUCOTROL) 5 MG tablet Take 5 mg by mouth daily before breakfast. For Diabetes (elevated blood sugar)  . guaiFENesin (ROBITUSSIN) 100 MG/5ML liquid Take 100 mg by mouth. (Standing Order) 2 tsp ( 10cc) orally evry six hours as need for co;ugh**NOT TO EXCEED 4 DOSES  . HYDROcodone-acetaminophen (NORCO) 5-325 MG per tablet Take one tablet by mouth every 6 hours as needed for severe pain. Do not exceed 3g of APAP per day  . hydrocortisone (ANUSOL-HC) 2.5 % rectal cream Place 1  application rectally as needed for hemorrhoids or itching.  . levothyroxine (SYNTHROID, LEVOTHROID) 50  MCG tablet TAKE 1 TABLET BY MOUTH DAILY BEFORE BREAKFAST ON EMPTY STOMACH *BINGO CARD*  . loperamide (IMODIUM) 2 MG capsule Take 2 mg by mouth. Take one tablet orally with each loose stool as need for diarrhea * NTE 8 doses in 24 hours*  . magnesium hydroxide (MILK OF MAGNESIA) 400 MG/5ML suspension Take by mouth daily as needed for constipation.  . metFORMIN (GLUCOPHAGE) 500 MG tablet Take 1 tablet (500 mg total) by mouth 2 (two) times daily with a meal.  . mirtazapine (REMERON) 15 MG tablet Take 15 mg by mouth at bedtime.  . Multiple Vitamins-Minerals (ICAPS MV) TABS Take 2 tablets by mouth. Take 2 capsules daily  . potassium chloride (K-DUR,KLOR-CON) 10 MEQ tablet Take 10 mEq by mouth daily. Take to prevent potassium loss (due to taking fluid pill)  . pravastatin (PRAVACHOL) 40 MG tablet Take 1 tablet (40 mg total) by mouth daily. For Cholesterol  . ranitidine (ZANTAC) 150 MG capsule Take 150 mg by mouth 2 (two) times daily.   . traMADol (ULTRAM) 50 MG tablet Take one tablet by mouth twice daily for pain  . traZODone (DESYREL) 50 MG tablet Take 50 mg by mouth at bedtime as needed.   . zoster vaccine live, PF, (ZOSTAVAX) 16109 UNT/0.65ML injection Inject 19,400 Units into the skin once. (Patient not taking: Reported on 12/20/2015)   No facility-administered encounter medications on file as of 12/20/2015.  :  Review of Systems:  Out of a complete 14 point review of systems, all are reviewed and negative with the exception of these symptoms as listed below:   Review of Systems  Gastrointestinal: Positive for constipation.  Neurological:       Leg cramps at night. Increased shaking when doing new activities.     Objective:  Neurologic Exam  Physical Exam Physical Examination:   Filed Vitals:   12/20/15 1420  BP: 134/74  Pulse: 76  Resp: 16    General Examination: The patient is a  very pleasant 81 y.o. female in no acute distress. She is calm and cooperative with the exam. She denies Auditory Hallucinations and Visual Hallucinations. She is in good spirits.  HEENT: Normocephalic, atraumatic, pupils are equal, round and reactive to light and accommodation. Extraocular tracking shows moderate saccadic breakdown without nystagmus noted. Hearing is intact. She has mild cataracts. Face is symmetric with moderate facial masking and normal facial sensation. Excess seborrhoic changes are seen on her face. There is no lip, neck or jaw tremor. Neck is moderately rigid with intact passive ROM. There are no carotid bruits on auscultation. Oropharynx exam reveals mild mouth dryness. No significant airway crowding is noted. Mallampati is class II. Tongue protrudes centrally and palate elevates symmetrically. No drooling is noted.  Chest: is clear to auscultation without wheezing, rhonchi or crackles noted.  Heart: sounds are regular and normal without murmur, no rubs or gallops noted, unchanged.   Abdomen: is soft, non-tender and non-distended with normal bowel sounds appreciated on auscultation.  Extremities: There is no edema in the lower extremities bilaterally. She is wearing compression stockings up to the knees.   Skin: is warm and dry with no trophic changes noted.   Musculoskeletal: exam reveals no obvious joint deformities, tenderness or joint swelling or erythema, with mild R knee pain and mild R medial knee swelling noted.   Neurologically:  Mental status: The patient is awake and alert, paying good attention. She is able to mostly provide the history. Her nieces provides some details. She is oriented  to: person, place, time/date, situation, day of week, month of year and year. Her memory, attention, language and knowledge are impaired, mildly. There is no aphasia, agnosia, apraxia or anomia. There is a mild degree of bradyphrenia. Speech is mildly hypophonic with no dysarthria  noted. Mood is congruent and affect is normal.   In May 2014: Her MMSE score was 29/30. CDT 4/4. AFT (Animal Fluency Test) score was 14.  On 12/08/2014: MMSE:27/30, CDT: 1/4, AFT: 18/min.   On 04/10/2015: MMSE: 29/30, CDT: 4/4, AFT: 12/min.   On 12/20/2015: MMSE: 29/30, CDT: 4/4, AFT: 13/min.   Cranial nerves are as described above under HEENT exam. In addition, shoulder shrug is normal with equal shoulder height noted.  Motor exam: Normal bulk, and strength for age is noted. Tone is mildly rigid with absence of cogwheeling. There is overall moderate bradykinesia. There is no drift or rebound. There is a very intermittent and slight resting tremor in both upper extremity is, left more than right.     Romberg is negative. Reflexes are 1+ in the upper extremities and 1+ in the lower extremities. Fine motor skills: Finger taps, hand movements, and rapid alternating patting are moderately impaired on the L and mild to moderately impaired on the R. Foot taps and foot agility are moderately impaired bilaterally, L worse than R.    Cerebellar testing shows no dysmetria or intention tremor on finger to nose testing. There is no truncal or gait ataxia.   Sensory exam is intact to light touch in the UEs and LEs.    Gait, station and balance: she stands with mild difficulty and pushes herself up and takes 2 attempts. She does report some right knee pain with standing. She has a moderately stooped posture. She walks fairly well with and her walker. This appears to be better than last time. She turns somewhat slowly in 3 steps but has no instability when turning.  is Her balance is mildly to moderately impaired.  Assessment and Plan:   In summary, MEGHANNE PLETZ is a very pleasant 81 year old female with a history of slowness, stiffness, recurrent falls, problems with fine motor skills, her posture, as well as drooling. She has evidence of left-sided predominant Parkinson's disease vs parkinsonism, and a  significant gait disorder with recurrent falls with injuries including pelvic fracture in the past. Her gait disorder is likely a function of multiple additional issues including advancing age, previous pelvic fracture, arthritis, especially affecting her knees and tendency towards dehydration. She is in assisted living at [facility] and has had some in-house physical therapy. Thankfully, she has not fallen recently and overall has done rather well. She has intermittent knee pain. She is on standing doses of tramadol 50 mg twice daily. She has been on Sinemet 1 pill 3 times a day. She has been fairly stable motor-wise and I do not think we need to increase the Sinemet quite yet. She is in agreement. She is advised to continue to use her 2 wheeled walker and stay better hydrated. She is advised her memory has remained stable. We will continue to monitor. I would like to see her back in 4 months, sooner if needed. She has had some visual disturbances and sometimes this can be seen in Parkinson's patients, and often this is related to poor smooth pursuit. She has had her eyes checked recently with stable findings.  She has not had her shingles vaccine. She is encouraged to get this. She is reluctant for some reason  to get this shot.  She is at risk for injury because of advanced age, history of osteoporosis and vitamin D deficiency, diabetes, advanced age and prior injuries and most importantly prior falls. I  asked her to increase her water intake, and use her walker at all times. She has had some improvement of her arthritic pain with steroid injections to both knees in the past but has some residual right knee pain.   In the past, she had testing including CTH, EEG, C. Doppler, cardiac monitor, all of which were unrevealing. I would like to see her back in about 4 months from now, sooner if the need arises.  I answered all their questions today and the patient and Kara Moore were in agreement.   I spent 25  minutes in total face-to-face time with the patient, more than 50% of which was spent in counseling and coordination of care, reviewing test results, reviewing medication and discussing or reviewing the diagnosis of PD, its prognosis and treatment options.

## 2015-12-20 NOTE — Patient Instructions (Signed)
We will continue with Sinemet 1 pill 3 times a day.  Your memory is stable.  Drink more water.  Follow up in 4 months.

## 2016-01-11 ENCOUNTER — Other Ambulatory Visit: Payer: Self-pay

## 2016-01-11 DIAGNOSIS — M170 Bilateral primary osteoarthritis of knee: Secondary | ICD-10-CM

## 2016-01-11 MED ORDER — TRAMADOL HCL 50 MG PO TABS: ORAL_TABLET | ORAL | Status: DC

## 2016-01-31 ENCOUNTER — Other Ambulatory Visit: Payer: Medicare Other

## 2016-01-31 DIAGNOSIS — E1142 Type 2 diabetes mellitus with diabetic polyneuropathy: Secondary | ICD-10-CM

## 2016-02-01 LAB — BASIC METABOLIC PANEL
BUN/Creatinine Ratio: 15 (ref 11–26)
BUN: 11 mg/dL (ref 8–27)
CO2: 24 mmol/L (ref 18–29)
Calcium: 9.3 mg/dL (ref 8.7–10.3)
Chloride: 98 mmol/L (ref 96–106)
Creatinine, Ser: 0.71 mg/dL (ref 0.57–1.00)
GFR calc Af Amer: 94 mL/min/{1.73_m2} (ref >59)
GFR calc non Af Amer: 81 mL/min/{1.73_m2} (ref >59)
Glucose: 144 mg/dL — ABNORMAL HIGH (ref 65–99)
Potassium: 4.2 mmol/L (ref 3.5–5.2)
Sodium: 140 mmol/L (ref 134–144)

## 2016-02-01 LAB — LIPID PANEL
Chol/HDL Ratio: 2.4 ratio units (ref 0.0–4.4)
Cholesterol, Total: 177 mg/dL (ref 100–199)
HDL: 73 mg/dL (ref >39)
LDL Calculated: 89 mg/dL (ref 0–99)
Triglycerides: 75 mg/dL (ref 0–149)
VLDL Cholesterol Cal: 15 mg/dL (ref 5–40)

## 2016-02-01 LAB — HEMOGLOBIN A1C
Est. average glucose Bld gHb Est-mCnc: 143 mg/dL
Hgb A1c MFr Bld: 6.6 % — ABNORMAL HIGH (ref 4.8–5.6)

## 2016-02-02 ENCOUNTER — Ambulatory Visit: Payer: Medicare Other | Admitting: Internal Medicine

## 2016-02-05 ENCOUNTER — Encounter: Payer: Self-pay | Admitting: Internal Medicine

## 2016-02-05 ENCOUNTER — Ambulatory Visit (INDEPENDENT_AMBULATORY_CARE_PROVIDER_SITE_OTHER): Payer: Medicare Other | Admitting: Internal Medicine

## 2016-02-05 VITALS — BP 130/78 | HR 68 | Temp 97.4°F | Ht 63.0 in | Wt 152.0 lb

## 2016-02-05 DIAGNOSIS — G20 Parkinson's disease: Secondary | ICD-10-CM | POA: Diagnosis not present

## 2016-02-05 DIAGNOSIS — E785 Hyperlipidemia, unspecified: Secondary | ICD-10-CM | POA: Diagnosis not present

## 2016-02-05 DIAGNOSIS — M170 Bilateral primary osteoarthritis of knee: Secondary | ICD-10-CM | POA: Diagnosis not present

## 2016-02-05 DIAGNOSIS — I10 Essential (primary) hypertension: Secondary | ICD-10-CM | POA: Diagnosis not present

## 2016-02-05 DIAGNOSIS — G4719 Other hypersomnia: Secondary | ICD-10-CM | POA: Diagnosis not present

## 2016-02-05 DIAGNOSIS — G20A1 Parkinson's disease without dyskinesia, without mention of fluctuations: Secondary | ICD-10-CM

## 2016-02-05 DIAGNOSIS — E1142 Type 2 diabetes mellitus with diabetic polyneuropathy: Secondary | ICD-10-CM | POA: Diagnosis not present

## 2016-02-05 DIAGNOSIS — I872 Venous insufficiency (chronic) (peripheral): Secondary | ICD-10-CM | POA: Diagnosis not present

## 2016-02-05 MED ORDER — MIRTAZAPINE 7.5 MG PO TABS: 7.5000 mg | ORAL_TABLET | Freq: Every day | ORAL | Status: DC

## 2016-02-05 MED ORDER — PRAVASTATIN SODIUM 80 MG PO TABS: 80.0000 mg | ORAL_TABLET | Freq: Every day | ORAL | Status: DC

## 2016-02-05 MED ORDER — TETANUS-DIPHTH-ACELL PERTUSSIS 5-2.5-18.5 LF-MCG/0.5 IM SUSP: 0.5000 mL | Freq: Once | INTRAMUSCULAR | Status: DC

## 2016-02-05 NOTE — Progress Notes (Signed)
Patient ID: Kara Moore, female   DOB: 08/07/1935, 81 y.o.   MRN: [id number]   [facility]  [facility] Provider:  Onesti Bonfiglio L. Renato Gailseed, D.O., C.M.D.  Code Status: DNR Goals of Care:  Advanced Directives 02/05/2016  Does patient have an advance directive? No  Type of Advance Directive -  Does patient want to make changes to advanced directive? -  Copy of advanced directive(s) in chart? -     Chief Complaint  Patient presents with  . Medical Management of Chronic Issues    4 mth follow-up  . Immunizations    declined shingles vaccine, discuss td    HPI: Patient is a 81 y.o. female seen today for medical management of chronic diseases.    Wanted to discuss tdap booster.  Agrees to this, Rx will be printed.  DMII:  Glucose levels are all in satisfactory range.  118-147  Hyperlipidemia:  Not quite at goal of <70 (is 89).  Can still go up on pravachol.  Parkinson's:  No falls.  Using walker outside, but not inside despite recommendations by myself and Dr. Frances FurbishAthar.  Has not had freezing.    Hypothyroidism:  Last tsh wnl.    Osteoarthritis:  Right knee stays swollen.  Sometimes cramps and foot might, too.  Does c/o knee and leg cramps frequently.  Also c/o indigestion and constipation.  Has as needed bowel regimen.  Is eating an apple a day.  It's helping her.  Seems indigestion times vary.  Has prn tums.  Not daily, but probably more than 1x per week.  They do help.    Sleeping at a moment's notice.  Is sleeping after breakfast.  Wakes up very early in the morning.    Past Medical History  Diagnosis Date  . Benign essential hypertension   . Diabetes mellitus without complication (HCC)   . Hyperlipidemia LDL goal < 100   . Hypothyroidism   . Memory loss   . Confusion   . Senile osteoporosis   . Macular degeneration     Past Surgical History  Procedure Laterality Date  . Adenoidectomy  2011    No Known Allergies    Medication List       This list is accurate as of:  02/05/16 10:15 AM.  Always use your most recent med list.               acetaminophen 500 MG tablet  Commonly known as:  TYLENOL  Take 500 mg by mouth. (STANDING ORDER) take 1 tablet evert 4 hours as need for fever up to 101.  headach and or minor discomfort.  ( don not exceed 2000 mg)  **MAX DOSE IS 3,000/24 HRS FROM ALL SOURCES88     aspirin 81 MG chewable tablet  Chew 81 mg by mouth daily.     calcium carbonate 1250 (500 Ca) MG tablet  Commonly known as:  OS-CAL - dosed in mg of elemental calcium  Take 1 tablet by mouth.     carbidopa-levodopa 25-100 MG tablet  Commonly known as:  SINEMET IR  Take 1 tablet by mouth 3 (three) times daily. Follow titration instructions provided. For Parkinson's Disease     Cholecalciferol 2000 units Caps  Take 1 capsule (2,000 Units total) by mouth daily.     fluticasone 50 MCG/ACT nasal spray  Commonly known as:  FLONASE  Place 2 sprays into the nose as needed.     furosemide 20 MG tablet  Commonly known as:  LASIX  Take 1/2 tablet orally every day     glipiZIDE 5 MG tablet  Commonly known as:  GLUCOTROL  Take 5 mg by mouth daily before breakfast. For Diabetes (elevated blood sugar)     hydrocortisone 2.5 % rectal cream  Commonly known as:  ANUSOL-HC  Place 1 application rectally as needed for hemorrhoids or itching.     ICAPS MV Tabs  Take 2 tablets by mouth. Take 2 capsules daily     levothyroxine 50 MCG tablet  Commonly known as:  SYNTHROID, LEVOTHROID  TAKE 1 TABLET BY MOUTH DAILY BEFORE BREAKFAST ON EMPTY STOMACH *BINGO CARD*     loperamide 2 MG capsule  Commonly known as:  IMODIUM  Take 2 mg by mouth. Take one tablet orally with each loose stool as need for diarrhea * NTE 8 doses in 24 hours*     magnesium hydroxide 400 MG/5ML suspension  Commonly known as:  MILK OF MAGNESIA  Take by mouth daily as needed for constipation.     metFORMIN 500 MG tablet  Commonly known as:  GLUCOPHAGE  Take 1 tablet (500 mg total) by mouth  2 (two) times daily with a meal.     MINTOX 200-200-20 MG/5ML suspension  Generic drug:  alum & mag hydroxide-simeth  Take 30 mLs by mouth every 6 (six) hours as needed for indigestion or heartburn.     mirtazapine 15 MG tablet  Commonly known as:  REMERON  Take 15 mg by mouth at bedtime.     potassium chloride 10 MEQ tablet  Commonly known as:  K-DUR,KLOR-CON  Take 10 mEq by mouth daily. Take to prevent potassium loss (due to taking fluid pill)     pravastatin 40 MG tablet  Commonly known as:  PRAVACHOL  Take 1 tablet (40 mg total) by mouth daily. For Cholesterol     ranitidine 150 MG capsule  Commonly known as:  ZANTAC  Take 150 mg by mouth 2 (two) times daily.     traMADol 50 MG tablet  Commonly known as:  ULTRAM  Take one tablet by mouth twice daily for pain     traZODone 50 MG tablet  Commonly known as:  DESYREL  Take 50 mg by mouth at bedtime as needed.        Review of Systems:  Review of Systems  Constitutional: Positive for malaise/fatigue. Negative for fever and chills.  HENT: Negative for congestion and hearing loss.   Eyes: Negative for blurred vision.  Respiratory: Negative for shortness of breath.   Cardiovascular: Negative for chest pain and palpitations.  Gastrointestinal: Positive for heartburn and constipation. Negative for nausea, vomiting, abdominal pain, diarrhea, blood in stool and melena.  Genitourinary: Positive for urgency. Negative for dysuria.  Musculoskeletal: Positive for joint pain. Negative for falls.  Skin: Negative for itching and rash.  Neurological: Positive for tremors and sensory change. Negative for weakness.  Endo/Heme/Allergies:       DMII, hypothyroid    Health Maintenance  Topic Date Due  . ZOSTAVAX  11/22/1996  . TETANUS/TDAP  02/16/2014  . URINE MICROALBUMIN  10/29/2015  . INFLUENZA VACCINE  06/18/2016  . HEMOGLOBIN A1C  08/02/2016  . FOOT EXAM  10/01/2016  . OPHTHALMOLOGY EXAM  10/17/2016  . DEXA SCAN  Completed    . PNA vac Low Risk Adult  Completed    Physical Exam: Filed Vitals:   02/05/16 0956  BP: 130/78  Pulse: 68  Temp: 97.4 F (36.3 C)  TempSrc: Oral  Height: 5'  3" (1.6 m)  Weight: 152 lb (68.947 kg)  SpO2: 95%   Body mass index is 26.93 kg/(m^2). Physical Exam  Constitutional: She is oriented to person, place, and time. She appears well-developed and well-nourished. No distress.  HENT:  Head: Normocephalic and atraumatic.  Cardiovascular: Normal rate, regular rhythm and intact distal pulses.   Murmur heard. Pulmonary/Chest: Effort normal and breath sounds normal. No respiratory distress.  Abdominal: Soft. Bowel sounds are normal. She exhibits no distension. There is no tenderness.  Musculoskeletal: Normal range of motion. She exhibits no tenderness.  Neurological: She is alert and oriented to person, place, and time. No cranial nerve deficit. She exhibits abnormal muscle tone. Coordination abnormal.  Shuffling gait, uses walker  Skin: Skin is warm and dry.  Psychiatric:  Flat affect    Labs reviewed: Basic Metabolic Panel:  Recent Labs  16/10/96 0842 09/26/15 0850 01/31/16 0855  NA  --  138 140  K  --  4.5 4.2  CL  --  98 98  CO2  --  24 24  GLUCOSE  --  143* 144*  BUN  --  11 11  CREATININE  --  0.71 0.71  CALCIUM  --  9.8 9.3  TSH 2.850 4.200  --    Liver Function Tests: No results for input(s): AST, ALT, ALKPHOS, BILITOT, PROT, ALBUMIN in the last 8760 hours. No results for input(s): LIPASE, AMYLASE in the last 8760 hours. No results for input(s): AMMONIA in the last 8760 hours. CBC:  Recent Labs  05/30/15 0842 09/26/15 0850  WBC 6.3 6.1  NEUTROABS 4.1 4.4  HCT 43.4 43.7  MCV 91 92  PLT 274 259   Lipid Panel:  Recent Labs  05/30/15 0842 01/31/16 0855  CHOL 166 177  HDL 65 73  LDLCALC 82 89  TRIG 94 75  CHOLHDL 2.6 2.4   Lab Results  Component Value Date   HGBA1C 6.6* 01/31/2016    Assessment/Plan 1. Parkinson's disease (tremor,  stiffness, slow motion, unstable posture) (HCC) -cont current sinemet as per neurology  2. Primary osteoarthritis of both knees -cont tylenol, tramadol when severe, walking for exercise with walker  3. Controlled type 2 diabetes mellitus with diabetic polyneuropathy, without long-term current use of insulin (HCC) -well controlled -cont metformin, pravachol, glipizide -f/u hba1c next time  4. Essential hypertension, benign -bp well controlled with only lasix -bp cannot tolerate more medications  5. Chronic venous insufficiency -cont compression hose, elevating feet, avoiding high sodium foods and adding sodium to food  6. Hyperlipidemia -cont pravachol  due to her diabetes primarily -tolerating just fine; lipids improving   7. Excessive daytime sleepiness -advised she needs to be active in the daytimes so she will sleep well at night  Next appt:  06/07/2016 for med mgt  Federica Allport L. Emary Zalar, D.O. Geriatrics [facility] [address], [place] [address] Cell Phone (Mon-Fri 8am-5pm):  [phone] On Call:  [phone] & follow prompts after 5pm & weekends Office Phone:  [phone] Office Fax:  [phone]

## 2016-04-08 ENCOUNTER — Other Ambulatory Visit: Payer: Self-pay | Admitting: *Deleted

## 2016-04-08 DIAGNOSIS — M170 Bilateral primary osteoarthritis of knee: Secondary | ICD-10-CM

## 2016-04-08 MED ORDER — TRAMADOL HCL 50 MG PO TABS: ORAL_TABLET | ORAL | Status: DC

## 2016-04-08 NOTE — Telephone Encounter (Signed)
[facility] Fax [facility]

## 2016-04-16 DIAGNOSIS — H353221 Exudative age-related macular degeneration, left eye, with active choroidal neovascularization: Secondary | ICD-10-CM | POA: Diagnosis not present

## 2016-04-16 LAB — HM DIABETES EYE EXAM

## 2016-04-24 ENCOUNTER — Ambulatory Visit: Payer: Medicare Other | Admitting: Neurology

## 2016-05-03 DIAGNOSIS — H353221 Exudative age-related macular degeneration, left eye, with active choroidal neovascularization: Secondary | ICD-10-CM | POA: Diagnosis not present

## 2016-05-08 ENCOUNTER — Encounter: Payer: Self-pay | Admitting: Neurology

## 2016-05-08 ENCOUNTER — Ambulatory Visit (INDEPENDENT_AMBULATORY_CARE_PROVIDER_SITE_OTHER): Payer: Medicare Other | Admitting: Neurology

## 2016-05-08 VITALS — BP 132/76 | HR 72 | Resp 16 | Ht 63.0 in | Wt 151.0 lb

## 2016-05-08 DIAGNOSIS — G20 Parkinson's disease: Secondary | ICD-10-CM

## 2016-05-08 MED ORDER — CARBIDOPA-LEVODOPA 25-100 MG PO TABS: 1.0000 | ORAL_TABLET | Freq: Four times a day (QID) | ORAL | Status: DC

## 2016-05-08 NOTE — Patient Instructions (Signed)
We will increase your sinemet to 1 pill 4 times a day.  Please increase your water intake.  Try to eat 1 banana a day, may help with leg cramping.  For your right knee pain, consider seeing ortho again.

## 2016-05-08 NOTE — Progress Notes (Signed)
Subjective:    Patient ID: Kara Moore is a 81 y.o. female.  HPI     Interim history:   Kara Moore is a very pleasant 81 year old right-handed woman with an underlying medical history of diabetes, hyperlipidemia, hypothyroidism, vitamin D deficiency, reflux disease, and hypertension and osteoporosis who presents for followup consultation of her gait disorder and parkinsonism, probable L sided predominant PD, complicated by recurrent falls. She is accompanied by her niece, Kara Moore, again today. I last saw her on 12/20/2015, at which time she reported more eye problems. She was on tramadol 50 mg bid, remeron 15 mg at night, trazodone 50 mg, C/L 3 times a day, written for 8 AM, 2 PM and 8 PM. She has some knee pain. Thankfully, she had not fallen.    Today, 05/08/2016: She reports more right knee pain and leg cramps on the right side. She does not always drink enough water. She has been given a Rollator type walker but does not take it with her during appointments because neither one of them can lift the walker up into the car. She has not had any recent falls. She had an injection into the left eye recently for her macular degeneration. She has no pain. She does have significant scleral hemorrhage. She has not recently been seen by orthopedics. She is currently no longer on trazodone and takes Remeron 7.5 mg at night which was reduced by her primary care physician.  Previously:  I saw her on 08/15/2015, at which time she was doing fairly well, her niece agreed. She had no recent falls. She was using her walker. Her diabetes numbers had improved and her memory was stable. She felt that her appetite was good but she does not always drink enough water. She was advised to continue with Sinemet 3 times a day and use her walker at all times. She was encouraged to drink more water. I ordered physical therapy and occupational therapy at [facility].  I saw her on 04/10/2015, at which time she  reported right knee pain. She did not go back to orthopedics. She was seen by her PCP recently. She was still residing at [facility] assisted [facility]. She had occasional left arm and hand tremor, especially worse when tired. She had no recent falls. Her latest A1c in March 2016 was 8.7. She had left eye surgery for removal of the chalazion. Kara Moore had no new concerns. Her memory was felt to be stable. Her MMSE was 29/30, CDT: 4/4, AFT: 12/min. I suggested she continue with Sinemet 1 pill 3 times a day.   I saw her on 12/08/2014, at which time her niece reported several falls. She was drooling more. She had knee pain and swelling. She had gone to the emergency room. She had seen orthopedics and received injections into both knees which improved her pain significantly. They were considering Synvisc injections. She had had some therapy at [facility]. She was not drinking enough water. She was using a 2 wheeled walker more consistently. She went to [facility] and had x-rays of her knees and ultrasound of her legs which did not show any clots per Kara Moore. I suggested we increase her Sinemet to 1 pill 3 times a day. I asked her to use her 2 wheeled walker consistently.   I saw her on 06/10/2014, at which time Kara Moore reported that they did not fill the order for the walker. The patient had a steroid injection into the right knee which helped marginally.  She was reporting more knee pain on the left. She had an x-ray of her right knee. I asked her to use a rolling walker at all times for gait safety. We talked about her gait disorder and the complexity of her situation. I continued low-dose Sinemet.  I saw her on 01/18/2014, at which time we talked about her using her walker at all times. She was on Remeron for sleep. I prescribed a rolling walker with seat. She indicated that she would not want to use a walker.   I saw her on 09/09/2013, at which time I felt that her gait disorder with recurrent  falls was out of proportion to her parkinsonism. I felt that her gait disorder was a function of advancing age, previous pelvic fracture, and arthritis. She had finished inpatient rehabilitation at the time. We talked about her recent additional testing including head CT, EEG, carotid Doppler study, cardiac monitor, all of which were unrevealing. She was advised to continue using her 2 wheeled walker at all times.   I saw her on 06/17/2013 after a recent fall and suggested further workup for a recent episode of confusion and her recurrent falls. I ordered CTH, EEG, C. Doppler, cardiac monitor and suggested a referral to geriatrics. She has since then started seeing Dr. Mariea Clonts.   Her head CT from 06/22/2013 was negative for any acute findings.   Her EEG from 06/25/2013 was normal in the awake and sleep states.   Her carotid Doppler study from 06/25/2013 was negative for any significant stenoses.   Her cardiac event monitor from 07/01/2013 through 07/30/2013 was consistent with normal sinus rhythm with occasional PVCs, no evidence of atrial fibrillation. This was interpreted by Dr. Dola Argyle.   She had an Echocardiogram on 04/15/13 at the [facility] in [place]. She has arthritis, in her R knee and had 2 injections before.   She lives at [facility] in [place]. She feels like "I have legs again". She is tolerating the C/L 25/100 mg 1/2 tid.   I first met her on 03/26/2013 at which time her niece and the patient reported a approximately two-month history of decrease in fine motor skills, decrease in mobility, gait changes and recurrent falls. She reported becoming slower than usual taking longer to dress herself, needing assistance with some of her ADLs. She was started on C/L at the time. She has had urinary urgency. She recently had physical therapy and her therapist noted lack of ability, inability to dress herself and drooling. The patient had to give up driving.   She had a MRI brain on  04/08/13: Abnormal MRI scan of the brain showing mild changes of age-appropriate chronic microvascular ischemia and generalized cerebral atrophy.   On 02/11/13 she fell out of her bed and had difficulty getting back up. Her nephew was visiting from [place], but he could not hear her, as he is hard of hearing and she was calling in a soft voice. She developed rhabdomyolysis. In the latter part of April she was taken to [facility] in [place], then to the ER for suspected cellulitis. She had an US of the LEs and did not have a blood clot. She was given lasix for swelling. The next day she fell, and then again, and then again the next day. After these falls, she started having care 24/7 through [facility]. She has been in PT since the end of April. She has been using a 2 wheeled walker since then.   She has  a strong FHx of tremors on her mother's side (including M, MGM and cousins). Both of her siblings passed away.   She never had a TIA or a stroke.   She fell at home on 04/30/13, while coming out of the bathroom. She has been having issues with constipation and had taken Dulcolax suppositories, but also took Mag citrate and her niece found her on the floor and she had soiled herself and for some reason had not pushed her Lifeline button. She denied hitting her head or LOC. Her niece called 54 and she was taken to [facility] to the ER and was discharged to home from there. Then she was admitted to inpt rehab at [facility] center on 05/02/13. She fell while in rehab and was found to have a pelvic Fx b/l, reportedly old. She has had some confusion intermittently.    Her Past Medical History Is Significant For: Past Medical History  Diagnosis Date  . Benign essential hypertension   . Diabetes mellitus without complication ([place])   . Hyperlipidemia LDL goal < 100   . Hypothyroidism   . Memory loss   . Confusion   . Senile osteoporosis   . Macular degeneration     Her Past Surgical History Is  Significant For: Past Surgical History  Procedure Laterality Date  . Adenoidectomy  2011    Her Family History Is Significant For: Family History  Problem Relation Age of Onset  . Tremor Maternal Grandmother   . Diabetes Paternal Grandfather   . Heart attack Father   . Cancer Sister   . Heart attack Brother   . Diabetes Brother   . Tremor Maternal Aunt   . Diabetes Paternal Grandmother     Her Social History Is Significant For: Social History   Social History  . Marital Status: Single    Spouse Name: N/A  . Number of Children: N/A  . Years of Education: N/A   Social History Main Topics  . Smoking status: Never Smoker   . Smokeless tobacco: None  . Alcohol Use: No  . Drug Use: No  . Sexual Activity: Not Asked   Other Topics Concern  . None   Social History Narrative    Her Allergies Are:  No Known Allergies:   Her Current Medications Are:  Outpatient Encounter Prescriptions as of 05/08/2016  Medication Sig  . acetaminophen (TYLENOL) 500 MG tablet Take 500 mg by mouth. (STANDING ORDER) take 1 tablet evert 4 hours as need for fever up to 101.  headach and or minor discomfort.  ( don not exceed 2000 mg)  **MAX DOSE IS 3,000/24 HRS FROM ALL SOURCES88  . alum & mag hydroxide-simeth (MINTOX) 200-200-20 MG/5ML suspension Take 30 mLs by mouth every 6 (six) hours as needed for indigestion or heartburn.  Marland Kitchen aspirin 81 MG chewable tablet Chew 81 mg by mouth daily.   . calcium carbonate (OS-CAL - DOSED IN MG OF ELEMENTAL CALCIUM) 1250 (500 CA) MG tablet Take 1 tablet by mouth.  . carbidopa-levodopa (SINEMET IR) 25-100 MG per tablet Take 1 tablet by mouth 3 (three) times daily. Follow titration instructions provided. For Parkinson's Disease  . Cholecalciferol 2000 UNITS CAPS Take 1 capsule (2,000 Units total) by mouth daily.  . fluticasone (FLONASE) 50 MCG/ACT nasal spray Place 2 sprays into the nose as needed.   . furosemide (LASIX) 20 MG tablet Take 1/2 tablet orally every day   . glipiZIDE (GLUCOTROL) 5 MG tablet Take 5 mg by mouth daily  before breakfast. For Diabetes (elevated blood sugar)  . hydrocortisone (ANUSOL-HC) 2.5 % rectal cream Place 1 application rectally as needed for hemorrhoids or itching.  . levothyroxine (SYNTHROID, LEVOTHROID) 50 MCG tablet TAKE 1 TABLET BY MOUTH DAILY BEFORE BREAKFAST ON EMPTY STOMACH *BINGO CARD*  . loperamide (IMODIUM) 2 MG capsule Take 2 mg by mouth. Take one tablet orally with each loose stool as need for diarrhea * NTE 8 doses in 24 hours*  . magnesium hydroxide (MILK OF MAGNESIA) 400 MG/5ML suspension Take by mouth daily as needed for constipation.  . metFORMIN (GLUCOPHAGE) 500 MG tablet Take 1 tablet (500 mg total) by mouth 2 (two) times daily with a meal.  . mirtazapine (REMERON) 7.5 MG tablet Take 1 tablet (7.5 mg total) by mouth at bedtime.  . Multiple Vitamins-Minerals (ICAPS MV) TABS Take 2 tablets by mouth. Take 2 capsules daily  . potassium chloride (K-DUR,KLOR-CON) 10 MEQ tablet Take 10 mEq by mouth daily. Take to prevent potassium loss (due to taking fluid pill)  . pravastatin (PRAVACHOL) 80 MG tablet Take 1 tablet (80 mg total) by mouth daily. For Cholesterol  . ranitidine (ZANTAC) 150 MG capsule Take 150 mg by mouth 2 (two) times daily.   . Tdap (BOOSTRIX) 5-2.5-18.5 LF-MCG/0.5 injection Inject 0.5 mLs into the muscle once.  . traMADol (ULTRAM) 50 MG tablet Take one tablet by mouth twice daily for pain   No facility-administered encounter medications on file as of 05/08/2016.  :  Review of Systems:  Out of a complete 14 point review of systems, all are reviewed and negative with the exception of these symptoms as listed below:    Review of Systems  Eyes:       Started injections in L eye for MD  Gastrointestinal: Positive for constipation.  Neurological:       Patient has started having R leg cramps at night. Today she has R knee pain.     Objective:  Neurologic Exam  Physical Exam Physical Examination:    Filed Vitals:   05/08/16 1512  BP: 132/76  Pulse: 72  Resp: 16   General Examination: The patient is a very pleasant 81 y.o. female in no acute distress. She is calm and cooperative with the exam. She is in good spirits today.  HEENT: Normocephalic, atraumatic, pupils are equal, round and reactive to light and accommodation. Significant left scleral hemorrhage, no pain reported. Extraocular tracking shows moderate saccadic breakdown without nystagmus noted. Hearing is intact. She has mild cataracts. Face is symmetric with moderate facial masking and normal facial sensation. Excess seborrhoic changes are seen on her face. There is no lip, neck or jaw tremor. Neck is moderately rigid with intact passive ROM. There are no carotid bruits on auscultation. Oropharynx exam reveals mild mouth dryness. No significant airway crowding is noted. Mallampati is class II. Tongue protrudes centrally and palate elevates symmetrically. No drooling is noted.  Chest: is clear to auscultation without wheezing, rhonchi or crackles noted.  Heart: sounds are regular and normal without murmur, no rubs or gallops noted, unchanged.   Abdomen: is soft, non-tender and non-distended with normal bowel sounds appreciated on auscultation.  Extremities: There is no edema in the lower extremities bilaterally. She is wearing compression stockings up to the knees.   Skin: is warm and dry with no trophic changes noted.   Musculoskeletal: exam reveals no obvious joint deformities, tenderness or joint swelling or erythema, with the exception of right knee pain and mild knee swelling noted, some valgus  deformity of R knee.   Neurologically:  Mental status: The patient is awake and alert, paying good attention. She is able to mostly provide the history. Her niece provides details. She is oriented to: person, place, time/date, situation, day of week, month of year and year. Her memory, attention, language and knowledge are  impaired, mildly. There is no aphasia, agnosia, apraxia or anomia. There is a mild degree of bradyphrenia. Speech is mildly hypophonic with no dysarthria noted. Mood is congruent and affect is normal.   In May 2014: Her MMSE score was 29/30. CDT 4/4. AFT (Animal Fluency Test) score was 14.  On 12/08/2014: MMSE:27/30, CDT: 1/4, AFT: 18/min.   On 04/10/2015: MMSE: 29/30, CDT: 4/4, AFT: 12/min.   On 12/20/2015: MMSE: 29/30, CDT: 4/4, AFT: 13/min.   Cranial nerves are as described above under HEENT exam. In addition, shoulder shrug is normal with equal shoulder height noted.  Motor exam: Normal bulk, and strength for age is noted. Tone is mildly rigid with absence of cogwheeling. There is overall moderate bradykinesia. There is no drift or rebound. There is a very intermittent and slight resting tremor in both upper extremity is, left more than right.     Romberg is negative. Reflexes are 1+ in the upper extremities and 1+ in the lower extremities. Fine motor skills: Finger taps, hand movements, and rapid alternating patting are moderately impaired on the L and mild to moderately impaired on the R. Foot taps and foot agility are moderately impaired bilaterally, L worse than R.    Cerebellar testing shows no dysmetria or intention tremor on finger to nose testing. There is no truncal or gait ataxia.   Sensory exam is intact to light touch in the UEs and LEs.    Gait, station and balance: she stands with mild difficulty and pushes herself up and takes 2 attempts. She does report some right knee pain with standing. She has a moderately stooped posture. She walks fairly well with and her walker. She turns somewhat slowly in 3 steps but has no instability when turning. Her balance is mildly to moderately impaired.  Assessment and Plan:   In summary, Kara Moore is a very pleasant 81 year old female with an underlying medical history of diabetes, hyperlipidemia, hypothyroidism, vitamin D deficiency,  reflux disease, and hypertension and osteoporosis who presents for followup consultation of her gait disorder and parkinsonism, probable L sided predominant PD, complicated by recurrent falls, including fall with injury, she suffered a pelvic fracture in the past. She uses her 2 wheeled walker or her 4 wheeled walker consistently and has not had any recent falls. She reports worsening right leg cramps and right knee pain. I suggested she consider seeing orthopedics again. She had in the past received cortisone injection, around December 2015. She did not want to pursue Synvisc injections as I understand. She continues to be at risk for falls. Her gait disorder is likely a function of multiple additional issues including advancing age, previous pelvic fracture, arthritis, especially affecting her knees and tendency towards dehydration. She is in assisted living at [facility] and has had some in-house physical therapy. Thankfully, she has not fallen recently and overall has done reasonably well. Today, I suggested that she increase her water intake and try to eat a banana per day. In addition, we will try an increased dose of Sinemet, one pill 4 times a day, at 8, 12, 4 PM and 8 PM. We will monitor her memory scores. Her niece reports  that her memory is worsening. I would like to see her back in 4 months, sooner if needed. She has not had her shingles vaccine. She is encouraged to get this. She is reluctant for some reason to get this shot. In the past, she had testing including CTH, EEG, C. Doppler, cardiac monitor, all of which were unrevealing. I would like to see her back in about 4 months from now, sooner if the need arises. I answered all their questions today and the patient and Kara Moore were in agreement.  I spent 25 minutes in total face-to-face time with the patient, more than 50% of which was spent in counseling and coordination of care, reviewing test results, reviewing medication and discussing or  reviewing the diagnosis of PD, its prognosis and treatment options.

## 2016-05-24 ENCOUNTER — Emergency Department
Admission: EM | Admit: 2016-05-24 | Discharge: 2016-05-24 | Disposition: A | Discharge status: Home / Self Care | Payer: Medicare Other | Attending: Emergency Medicine | Admitting: Emergency Medicine

## 2016-05-24 ENCOUNTER — Emergency Department: Payer: Medicare Other

## 2016-05-24 DIAGNOSIS — R410 Disorientation, unspecified: Secondary | ICD-10-CM

## 2016-05-24 DIAGNOSIS — Y999 Unspecified external cause status: Secondary | ICD-10-CM | POA: Insufficient documentation

## 2016-05-24 DIAGNOSIS — R296 Repeated falls: Secondary | ICD-10-CM | POA: Insufficient documentation

## 2016-05-24 DIAGNOSIS — Z7984 Long term (current) use of oral hypoglycemic drugs: Secondary | ICD-10-CM | POA: Diagnosis not present

## 2016-05-24 DIAGNOSIS — E119 Type 2 diabetes mellitus without complications: Secondary | ICD-10-CM | POA: Diagnosis not present

## 2016-05-24 DIAGNOSIS — M810 Age-related osteoporosis without current pathological fracture: Secondary | ICD-10-CM | POA: Diagnosis not present

## 2016-05-24 DIAGNOSIS — N390 Urinary tract infection, site not specified: Secondary | ICD-10-CM | POA: Diagnosis not present

## 2016-05-24 DIAGNOSIS — R531 Weakness: Secondary | ICD-10-CM | POA: Diagnosis not present

## 2016-05-24 DIAGNOSIS — Z79899 Other long term (current) drug therapy: Secondary | ICD-10-CM | POA: Insufficient documentation

## 2016-05-24 DIAGNOSIS — W19XXXA Unspecified fall, initial encounter: Secondary | ICD-10-CM | POA: Diagnosis not present

## 2016-05-24 DIAGNOSIS — E039 Hypothyroidism, unspecified: Secondary | ICD-10-CM | POA: Insufficient documentation

## 2016-05-24 DIAGNOSIS — M1990 Unspecified osteoarthritis, unspecified site: Secondary | ICD-10-CM | POA: Diagnosis not present

## 2016-05-24 DIAGNOSIS — Z7982 Long term (current) use of aspirin: Secondary | ICD-10-CM | POA: Insufficient documentation

## 2016-05-24 DIAGNOSIS — E785 Hyperlipidemia, unspecified: Secondary | ICD-10-CM | POA: Insufficient documentation

## 2016-05-24 DIAGNOSIS — Y92002 Bathroom of unspecified non-institutional (private) residence single-family (private) house as the place of occurrence of the external cause: Secondary | ICD-10-CM | POA: Insufficient documentation

## 2016-05-24 DIAGNOSIS — I10 Essential (primary) hypertension: Secondary | ICD-10-CM | POA: Diagnosis not present

## 2016-05-24 DIAGNOSIS — R4182 Altered mental status, unspecified: Secondary | ICD-10-CM | POA: Diagnosis not present

## 2016-05-24 DIAGNOSIS — Z7951 Long term (current) use of inhaled steroids: Secondary | ICD-10-CM | POA: Diagnosis not present

## 2016-05-24 DIAGNOSIS — M1711 Unilateral primary osteoarthritis, right knee: Secondary | ICD-10-CM | POA: Diagnosis not present

## 2016-05-24 DIAGNOSIS — Y939 Activity, unspecified: Secondary | ICD-10-CM | POA: Insufficient documentation

## 2016-05-24 LAB — CBC WITH DIFFERENTIAL/PLATELET
BASOS ABS: 0 10*3/uL (ref 0–0.1)
BASOS PCT: 0 %
EOS ABS: 0 10*3/uL (ref 0–0.7)
EOS PCT: 0 %
HEMATOCRIT: 43.9 % (ref 35.0–47.0)
Hemoglobin: 15 g/dL (ref 12.0–16.0)
LYMPHS ABS: 0.4 10*3/uL — AB (ref 1.0–3.6)
LYMPHS PCT: 8 %
MCH: 31.1 pg (ref 26.0–34.0)
MCHC: 34.2 g/dL (ref 32.0–36.0)
MCV: 90.9 fL (ref 80.0–100.0)
MONOS PCT: 1 %
Monocytes Absolute: 0 10*3/uL — ABNORMAL LOW (ref 0.2–0.9)
NEUTROS PCT: 91 %
Neutro Abs: 5.4 10*3/uL (ref 1.4–6.5)
PLATELETS: 236 10*3/uL (ref 150–440)
RBC: 4.83 MIL/uL (ref 3.80–5.20)
RDW: 13.6 % (ref 11.5–14.5)
WBC: 5.9 10*3/uL (ref 3.6–11.0)

## 2016-05-24 LAB — URINALYSIS COMPLETE WITH MICROSCOPIC (ARMC ONLY)
BILIRUBIN URINE: NEGATIVE
Bacteria, UA: NONE SEEN
Hgb urine dipstick: NEGATIVE
Leukocytes, UA: NEGATIVE
NITRITE: NEGATIVE
Protein, ur: NEGATIVE mg/dL
SPECIFIC GRAVITY, URINE: 1.021 (ref 1.005–1.030)
Squamous Epithelial / LPF: NONE SEEN
pH: 6 (ref 5.0–8.0)

## 2016-05-24 LAB — BASIC METABOLIC PANEL
Anion gap: 11 (ref 5–15)
BUN: 19 mg/dL (ref 6–20)
CHLORIDE: 101 mmol/L (ref 101–111)
CO2: 23 mmol/L (ref 22–32)
CREATININE: 0.51 mg/dL (ref 0.44–1.00)
Calcium: 9.7 mg/dL (ref 8.9–10.3)
GFR calc Af Amer: >60 mL/min (ref >60)
GFR calc non Af Amer: >60 mL/min (ref >60)
Glucose, Bld: 232 mg/dL — ABNORMAL HIGH (ref 65–99)
Potassium: 4.2 mmol/L (ref 3.5–5.1)
Sodium: 135 mmol/L (ref 135–145)

## 2016-05-24 LAB — TROPONIN I

## 2016-05-24 NOTE — ED Provider Notes (Signed)
[facility] Emergency Department Provider Note  ____________________________________________  Time seen: Approximately 6:23 PM  I have reviewed the triage vital signs and the nursing notes.   HISTORY  Chief Complaint Altered Mental Status and Fall   HPI Kara Moore is a 81 y.o. female history of hypertension, diabetes, osteoporosis, dementia, Parkinson's disease and history of falls who presents after a fall and altered mental status. Patient lives at [facility] and had a unwitnessed fall in the bathroom. Patient does not remember falling but does remember trying to get up in the bathroom floor. Patientsssue and also does not remember the events preceding the fall. She was found by staff who felt patient was confused and EMS was called. Patient is now accompanied by her 2 nieces who report the patient is at her baseline. Patient was seen by [facility] earlier today for a cortisone injection over her right knee that she has had chronic problems with. Patient denies headache, chest pain, shortness of breath, palpitations, abdominal pain, nausea, vomiting, dysuria, fever, neck pain, back pain, hip pain. Patient is not on any blood thinners. History is limited due to patient's dementia.  Past Medical History  Diagnosis Date  . Benign essential hypertension   . Diabetes mellitus without complication (HCC)   . Hyperlipidemia LDL goal < 100   . Hypothyroidism   . Memory loss   . Confusion   . Senile osteoporosis   . Macular degeneration     Patient Active Problem List   Diagnosis Date Noted  . Thyroid activity decreased 07/21/2014  . Primary osteoarthritis of both knees 07/21/2014  . Essential hypertension, benign 07/21/2014  . Other and unspecified hyperlipidemia 07/21/2014  . Osteoarthritis of right knee 01/31/2014  . Diabetes mellitus type 2, uncomplicated (HCC) 01/17/2014  . Unspecified hypothyroidism 01/17/2014  . Right calf pain 01/17/2014   . Parkinson's disease (tremor, stiffness, slow motion, unstable posture) (HCC) 10/19/2013  . Memory loss   . Hypothyroidism   . Macular degeneration   . Senile osteoporosis   . Hyperlipidemia   . Benign essential hypertension   . Recurrent falls 06/17/2013    Past Surgical History  Procedure Laterality Date  . Adenoidectomy  2011    Current Outpatient Rx  Name  Route  Sig  Dispense  Refill  . acetaminophen (TYLENOL) 500 MG tablet   Oral   Take 500 mg by mouth. (STANDING ORDER) take 1 tablet evert 4 hours as need for fever up to 101.  headach and or minor discomfort.  ( don not exceed 2000 mg)  **MAX DOSE IS 3,000/24 HRS FROM ALL SOURCES88         . alum & mag hydroxide-simeth (MINTOX) 200-200-20 MG/5ML suspension   Oral   Take 30 mLs by mouth every 6 (six) hours as needed for indigestion or heartburn.         Marland Kitchen aspirin 81 MG chewable tablet   Oral   Chew 81 mg by mouth daily.          . calcium carbonate (OS-CAL - DOSED IN MG OF ELEMENTAL CALCIUM) 1250 (500 CA) MG tablet   Oral   Take 1 tablet by mouth.         . carbidopa-levodopa (SINEMET IR) 25-100 MG tablet   Oral   Take 1 tablet by mouth 4 (four) times daily. Take at 8, 12, 4 PM, 8 PM.   120 tablet   5   . Cholecalciferol 2000 UNITS CAPS  Oral   Take 1 capsule (2,000 Units total) by mouth daily.   30 each   3   . fluticasone (FLONASE) 50 MCG/ACT nasal spray   Nasal   Place 2 sprays into the nose as needed.          . furosemide (LASIX) 20 MG tablet      Take 1/2 tablet orally every day         . glipiZIDE (GLUCOTROL) 5 MG tablet   Oral   Take 5 mg by mouth daily before breakfast. For Diabetes (elevated blood sugar)         . hydrocortisone (ANUSOL-HC) 2.5 % rectal cream   Rectal   Place 1 application rectally as needed for hemorrhoids or itching.         . levothyroxine (SYNTHROID, LEVOTHROID) 50 MCG tablet      TAKE 1 TABLET BY MOUTH DAILY BEFORE BREAKFAST ON EMPTY STOMACH *BINGO  CARD*   30 tablet   2     Maximum Refills Reached   . loperamide (IMODIUM) 2 MG capsule   Oral   Take 2 mg by mouth. Take one tablet orally with each loose stool as need for diarrhea * NTE 8 doses in 24 hours*         . magnesium hydroxide (MILK OF MAGNESIA) 400 MG/5ML suspension   Oral   Take by mouth daily as needed for constipation.         . metFORMIN (GLUCOPHAGE) 500 MG tablet   Oral   Take 1 tablet (500 mg total) by mouth 2 (two) times daily with a meal.   60 tablet   3   . mirtazapine (REMERON) 7.5 MG tablet   Oral   Take 1 tablet (7.5 mg total) by mouth at bedtime.   90 tablet   3   . Multiple Vitamins-Minerals (ICAPS MV) TABS   Oral   Take 2 tablets by mouth. Take 2 capsules daily         . potassium chloride (K-DUR,KLOR-CON) 10 MEQ tablet   Oral   Take 10 mEq by mouth daily. Take to prevent potassium loss (due to taking fluid pill)         . pravastatin (PRAVACHOL) 80 MG tablet   Oral   Take 1 tablet (80 mg total) by mouth daily. For Cholesterol   90 tablet   3   . ranitidine (ZANTAC) 150 MG capsule   Oral   Take 150 mg by mouth 2 (two) times daily.          . Tdap (BOOSTRIX) 5-2.5-18.5 LF-MCG/0.5 injection   Intramuscular   Inject 0.5 mLs into the muscle once.   0.5 mL   0   . traMADol (ULTRAM) 50 MG tablet      Take one tablet by mouth twice daily for pain   60 tablet   5     Allergies Review of patient's allergies indicates no known allergies.  Family History  Problem Relation Age of Onset  . Tremor Maternal Grandmother   . Diabetes Paternal Grandfather   . Heart attack Father   . Cancer Sister   . Heart attack Brother   . Diabetes Brother   . Tremor Maternal Aunt   . Diabetes Paternal Grandmother     Social History Social History  Substance Use Topics  . Smoking status: Never Smoker   . Smokeless tobacco: None  . Alcohol Use: No    Review of Systems Constitutional: Negative for  fever. Eyes: Negative for visual  changes. ENT: Negative for sore throat. Cardiovascular: Negative for chest pain. Respiratory: Negative for shortness of breath. Gastrointestinal: Negative for abdominal pain, vomiting or diarrhea. Genitourinary: Negative for dysuria. Musculoskeletal: Negative for back pain. Skin: Negative for rash. Neurological: Negative for headaches, weakness or numbness.  ____________________________________________   PHYSICAL EXAM:  VITAL SIGNS: ED Triage Vitals  Enc Vitals Group     BP 05/24/16 1809 163/92 mmHg     Pulse Rate 05/24/16 1809 100     Resp 05/24/16 1809 18     Temp 05/24/16 1809 97.9 F (36.6 C)     Temp Source 05/24/16 1809 Oral     SpO2 05/24/16 1809 97 %     Weight 05/24/16 1809 152 lb (68.947 kg)     Height 05/24/16 1809 5\' 6"  (1.676 m)     Head Cir --      Peak Flow --      Pain Score --      Pain Loc --      Pain Edu? --      Excl. in GC? --     Constitutional: Alert and oriented. Well appearing and in no apparent distress. HEENT:      Head: Normocephalic and atraumatic.         Eyes: Conjunctivae are normal. Sclera is non-icteric. EOMI. PERRL      Mouth/Throat: Mucous membranes are moist.       Neck: Supple with no signs of meningismus. No c-spine ttp Cardiovascular: Regular rate and rhythm. No murmurs, gallops, or rubs. 2+ symmetrical distal pulses are present in all extremities. No JVD. Respiratory: Normal respiratory effort. Lungs are clear to auscultation bilaterally. No wheezes, crackles, or rhonchi.  Gastrointestinal: Soft, non tender, and non distended with positive bowel sounds. No rebound or guarding. Genitourinary: No suprapubic tenderness. No CVA tenderness. Musculoskeletal: Mild swelling of the right knee, full painless range of motion of all extremities. No T and L-spine tenderness Neurologic: Normal speech and language. Face is symmetric. Moving all extremities. No gross focal neurologic deficits are appreciated. Skin: Skin is warm, dry and intact.  No rash noted. Psychiatric: Mood and affect are normal. Speech and behavior are normal.  ____________________________________________   LABS (all labs ordered are listed, but only abnormal results are displayed)  Labs Reviewed  BASIC METABOLIC PANEL - Abnormal; Notable for the following:    Glucose, Bld 232 (*)    All other components within normal limits  CBC WITH DIFFERENTIAL/PLATELET - Abnormal; Notable for the following:    Lymphs Abs 0.4 (*)    Monocytes Absolute 0.0 (*)    All other components within normal limits  URINALYSIS COMPLETEWITH MICROSCOPIC ([facility] ONLY) - Abnormal; Notable for the following:    Color, Urine YELLOW (*)    APPearance CLEAR (*)    Glucose, UA >500 (*)    Ketones, ur TRACE (*)    All other components within normal limits  URINE CULTURE  TROPONIN I   ____________________________________________  EKG  ED ECG REPORT I, Nita Sicklearolina Jerimah Witucki, the attending physician, personally viewed and interpreted this ECG.  Normal sinus rhythm, rate of 86, normal intervals, normal axis, no ST elevations or depressions. ____________________________________________  RADIOLOGY  Head CT: negative  ____________________________________________   PROCEDURES  Procedure(s) performed: None Critical Care performed:  None ____________________________________________   INITIAL IMPRESSION / ASSESSMENT AND PLAN / ED COURSE  81 y.o. female history of hypertension, diabetes, osteoporosis, dementia, Parkinson's disease and history of falls who presents after  a fall and altered mental status. Patient is back to baseline according to family and is no complaints at this time. However because of her dementia she does not remember the events preceding the fall or the fall itself. We'll get basic labs, urinalysis, EKG, and a head CT and if patient remains at her baseline with negative workup we'll discharge her back to her facility.  ----------------------------------------- 7:50  PM on 05/24/2016 -----------------------------------------  Workup essentially unremarkable. Patient remains at baseline. Patient will be discharged to the care of her niece this will take her back to her facility. Recommend a follow-up with her doctor on Monday.  Pertinent labs & imaging results that were available during my care of the patient were reviewed by me and considered in my medical decision making (see chart for details).    I discussed my evaluation of the patient's symptoms, my clinical impression, and my proposed outpatient treatment plan with patient/ family members. We have discussed anticipatory guidance, scheduled follow-up, and careful return precautions. The patient expresses understanding and is comfortable with the discharge plan. All patient's questions were answered.   ____________________________________________   FINAL CLINICAL IMPRESSION(S) / ED DIAGNOSES  Final diagnoses:  Fall, initial encounter  Confusion      NEW MEDICATIONS STARTED DURING THIS VISIT:  New Prescriptions   No medications on file     Note:  This document was prepared using Dragon voice recognition software and may include unintentional dictation errors.     Nita Sicklearolina Peter Daquila, MD 05/24/16 [phone]

## 2016-05-24 NOTE — ED Notes (Signed)
Pt to ED from [facility] c/o fall & altered mental status. Per EMS pt fell today denies hitting head, and staff at [facility] reports pt not acting as herself concerned about a possible UTI. Pt family also reports pt receiving a cortisone shot and having fluid drained off her right knee today. Pt alert and oriented in no acute distress at this time.

## 2016-05-25 LAB — URINE CULTURE: Culture: <10000 — AB

## 2016-05-31 DIAGNOSIS — E119 Type 2 diabetes mellitus without complications: Secondary | ICD-10-CM | POA: Diagnosis not present

## 2016-05-31 DIAGNOSIS — M179 Osteoarthritis of knee, unspecified: Secondary | ICD-10-CM | POA: Diagnosis not present

## 2016-05-31 DIAGNOSIS — G20 Parkinson's disease: Secondary | ICD-10-CM | POA: Diagnosis not present

## 2016-05-31 DIAGNOSIS — E039 Hypothyroidism, unspecified: Secondary | ICD-10-CM | POA: Diagnosis not present

## 2016-06-01 LAB — LIPID PANEL
CHOLESTEROL: 154 mg/dL (ref 0–200)
HDL: 73 mg/dL — AB (ref 35–70)
LDL Cholesterol: 69 mg/dL
Triglycerides: 58 mg/dL (ref 40–160)

## 2016-06-01 LAB — BASIC METABOLIC PANEL
BUN: 15 mg/dL (ref 4–21)
Creatinine: 0.7 mg/dL (ref 0.5–1.1)
Glucose: 156 mg/dL
POTASSIUM: 4.5 mmol/L (ref 3.4–5.3)
SODIUM: 136 mmol/L — AB (ref 137–147)

## 2016-06-01 LAB — TSH: TSH: 2.59 u[IU]/mL (ref 0.41–5.90)

## 2016-06-01 LAB — CBC AND DIFFERENTIAL
HCT: 42 % (ref 36–46)
HEMOGLOBIN: 14.3 g/dL (ref 12.0–16.0)
Platelets: 278 10*3/uL (ref 150–399)
WBC: 8.5 10^3/mL

## 2016-06-01 LAB — HEMOGLOBIN A1C: Hemoglobin A1C: 6.8

## 2016-06-04 ENCOUNTER — Other Ambulatory Visit: Payer: Self-pay | Admitting: *Deleted

## 2016-06-04 DIAGNOSIS — M170 Bilateral primary osteoarthritis of knee: Secondary | ICD-10-CM

## 2016-06-04 MED ORDER — TRAMADOL HCL 50 MG PO TABS: ORAL_TABLET | ORAL | Status: DC

## 2016-06-04 NOTE — Telephone Encounter (Signed)
[facility]

## 2016-06-07 ENCOUNTER — Encounter: Payer: Self-pay | Admitting: Internal Medicine

## 2016-06-07 ENCOUNTER — Ambulatory Visit (INDEPENDENT_AMBULATORY_CARE_PROVIDER_SITE_OTHER): Payer: Medicare Other | Admitting: Internal Medicine

## 2016-06-07 VITALS — BP 140/80 | HR 67 | Temp 97.8°F | Wt 146.0 lb

## 2016-06-07 DIAGNOSIS — M1711 Unilateral primary osteoarthritis, right knee: Secondary | ICD-10-CM

## 2016-06-07 DIAGNOSIS — E119 Type 2 diabetes mellitus without complications: Secondary | ICD-10-CM | POA: Diagnosis not present

## 2016-06-07 DIAGNOSIS — R413 Other amnesia: Secondary | ICD-10-CM

## 2016-06-07 DIAGNOSIS — I10 Essential (primary) hypertension: Secondary | ICD-10-CM

## 2016-06-07 DIAGNOSIS — G20 Parkinson's disease: Secondary | ICD-10-CM | POA: Diagnosis not present

## 2016-06-07 DIAGNOSIS — E039 Hypothyroidism, unspecified: Secondary | ICD-10-CM | POA: Diagnosis not present

## 2016-06-07 DIAGNOSIS — H3530 Unspecified macular degeneration: Secondary | ICD-10-CM

## 2016-06-07 DIAGNOSIS — H353221 Exudative age-related macular degeneration, left eye, with active choroidal neovascularization: Secondary | ICD-10-CM | POA: Diagnosis not present

## 2016-06-07 DIAGNOSIS — G20A1 Parkinson's disease without dyskinesia, without mention of fluctuations: Secondary | ICD-10-CM

## 2016-06-07 NOTE — Progress Notes (Signed)
Location:  [facility] clinic Provider:  Child Campoy L. Renato Gails, D.O., C.M.D.  Code Status: DNR Goals of Care:  Advanced Directives 06/07/2016  Does patient have an advance directive? No  Type of Advance Directive -  Does patient want to make changes to advanced directive? -  Copy of advanced directive(s) in chart? -  never received POA    Chief Complaint  Patient presents with  . Medical Management of Chronic Issues    follow-up    HPI: Patient is a 81 y.o. female seen today for medical management of chronic diseases.    Went to ED on 05/24/16 for a fall.  She fell going out of the bathroom, in the bathroom or off the toilet or in the bedroom--very unclear.  Another resident found her sitting on the bed.  Her sister is unclear on how she could have gotten up off the floor on her own.  She had just seen ortho about her knee and she had a cortisone shot in it and had 10-15 cc fluid drawn off the right knee.   Still has some sharp pain occasionally through the knee, but otherwise helped as not achy all the time or limiting her activity anymore.  Feeling all right.  She denies difficulty getting around, but has bad days of fatigue.  Harder to get up out of chair now.  Probably has been a year since PT was done per her sister.    C/o leg cramps sometimes in lower thigh or upper thigh and mainly on the right.  Mostly these happen at night, but happened twice when eating during the day last week.  Dr. Frances Furbish suggested increased water and eating a banana.  She can't eat a whole banana.  Discussed almonds for magnesium.  Also has tried a spoonful of mustard, also tried tonic water.    Has started treatment in her left eye for macular degeneration.  Goes this afternoon for the second shot in the eye.  1st injection was 6/1.  Does note improvement in her close vision.    Past Medical History  Diagnosis Date  . Benign essential hypertension   . Diabetes mellitus without complication (HCC)   .  Hyperlipidemia LDL goal < 100   . Hypothyroidism   . Memory loss   . Confusion   . Senile osteoporosis   . Macular degeneration     Past Surgical History  Procedure Laterality Date  . Adenoidectomy  2011    No Known Allergies    Medication List       This list is accurate as of: 06/07/16 10:46 AM.  Always use your most recent med list.               acetaminophen 500 MG tablet  Commonly known as:  TYLENOL  Take 500 mg by mouth as needed.     aspirin 81 MG chewable tablet  Chew 81 mg by mouth daily.     calcium carbonate 1250 (500 Ca) MG tablet  Commonly known as:  OS-CAL - dosed in mg of elemental calcium  Take 1 tablet by mouth daily.     carbidopa-levodopa 25-100 MG tablet  Commonly known as:  SINEMET IR  Take 1 tablet by mouth 4 (four) times daily. Take at 8, 12, 4 PM, 8 PM.     Cholecalciferol 2000 units Caps  Take 1 capsule (2,000 Units total) by mouth daily.     fluticasone 50 MCG/ACT nasal spray  Commonly known as:  FLONASE  Place 2 sprays into the nose as needed.     furosemide 20 MG tablet  Commonly known as:  LASIX  Take 1/2 tablet orally every day     glipiZIDE 5 MG tablet  Commonly known as:  GLUCOTROL  Take 5 mg by mouth daily before breakfast. For Diabetes (elevated blood sugar)     hydrocortisone 2.5 % rectal cream  Commonly known as:  ANUSOL-HC  Place 1 application rectally as needed for hemorrhoids or itching.     ICAPS MV Tabs  Take 2 tablets by mouth daily.     levothyroxine 50 MCG tablet  Commonly known as:  SYNTHROID, LEVOTHROID  TAKE 1 TABLET BY MOUTH DAILY BEFORE BREAKFAST ON EMPTY STOMACH *BINGO CARD*     loperamide 2 MG capsule  Commonly known as:  IMODIUM  Take 2 mg by mouth. Take one tablet orally with each loose stool as need for diarrhea * NTE 8 doses in 24 hours*     magnesium hydroxide 400 MG/5ML suspension  Commonly known as:  MILK OF MAGNESIA  Take by mouth daily as needed for constipation.     metFORMIN 500 MG  tablet  Commonly known as:  GLUCOPHAGE  Take 1 tablet (500 mg total) by mouth 2 (two) times daily with a meal.     MINTOX 200-200-20 MG/5ML suspension  Generic drug:  alum & mag hydroxide-simeth  Take 30 mLs by mouth every 6 (six) hours as needed for indigestion or heartburn.     mirtazapine 7.5 MG tablet  Commonly known as:  REMERON  Take 1 tablet (7.5 mg total) by mouth at bedtime.     potassium chloride 10 MEQ tablet  Commonly known as:  K-DUR,KLOR-CON  Take 10 mEq by mouth daily. Take to prevent potassium loss (due to taking fluid pill)     pravastatin 80 MG tablet  Commonly known as:  PRAVACHOL  Take 1 tablet (80 mg total) by mouth daily. For Cholesterol     ranitidine 150 MG capsule  Commonly known as:  ZANTAC  Take 150 mg by mouth at bedtime.     traMADol 50 MG tablet  Commonly known as:  ULTRAM  Take one tablet by mouth twice daily for pain        Review of Systems:  Review of Systems  Constitutional: Positive for malaise/fatigue. Negative for chills and fever.  HENT: Positive for hearing loss.   Eyes: Negative for blurred vision.  Respiratory: Negative for cough and shortness of breath.   Cardiovascular: Positive for leg swelling. Negative for chest pain and palpitations.  Gastrointestinal: Negative for abdominal pain, blood in stool, constipation and melena.  Genitourinary: Positive for urgency. Negative for dysuria, flank pain and hematuria.  Musculoskeletal: Positive for falls and joint pain.  Skin: Negative for itching and rash.  Neurological: Negative for dizziness and loss of consciousness.  Endo/Heme/Allergies: Bruises/bleeds easily.  Psychiatric/Behavioral: Positive for depression and memory loss.    Health Maintenance  Topic Date Due  . ZOSTAVAX  11/22/1996  . TETANUS/TDAP  02/16/2014  . URINE MICROALBUMIN  10/29/2015  . INFLUENZA VACCINE  06/18/2016  . FOOT EXAM  10/01/2016  . HEMOGLOBIN A1C  12/02/2016  . OPHTHALMOLOGY EXAM  04/16/2017  .  DEXA SCAN  Completed  . PNA vac Low Risk Adult  Completed    Physical Exam: Filed Vitals:   06/07/16 1030  BP: 140/80  Pulse: 67  Temp: 97.8 F (36.6 C)  TempSrc: Oral  Weight: 146 lb (66.225  kg)  SpO2: 98%   Body mass index is 23.58 kg/(m^2). Physical Exam  Constitutional: She appears well-developed and well-nourished. No distress.  Cardiovascular: Normal rate, regular rhythm, normal heart sounds and intact distal pulses.   Pulmonary/Chest: Effort normal and breath sounds normal. No respiratory distress.  Abdominal: Soft. Bowel sounds are normal. She exhibits no distension. There is no tenderness. There is no guarding.  Musculoskeletal: Normal range of motion.  Increased difficulty getting up out of chair  Neurological: She is alert. She exhibits abnormal muscle tone. Coordination abnormal.  Oriented x 2; walks with walker  Skin: Skin is warm and dry.    Labs reviewed: Basic Metabolic Panel:  Recent Labs  47/82/9510/07/03 0850 01/31/16 0855 05/24/16 1915 06/01/16  NA 138 140 135 136*  K 4.5 4.2 4.2 4.5  CL 98 98 101  --   CO2 24 24 23   --   GLUCOSE 143* 144* 232*  --   BUN 11 11 19 15   CREATININE 0.71 0.71 0.51 0.7  CALCIUM 9.8 9.3 9.7  --   TSH 4.200  --   --  2.59   Liver Function Tests: No results for input(s): AST, ALT, ALKPHOS, BILITOT, PROT, ALBUMIN in the last 8760 hours. No results for input(s): LIPASE, AMYLASE in the last 8760 hours. No results for input(s): AMMONIA in the last 8760 hours. CBC:  Recent Labs  09/26/15 0850 05/24/16 1915 06/01/16  WBC 6.1 5.9 8.5  NEUTROABS 4.4 5.4  --   HGB  --  15.0 14.3  HCT 43.7 43.9 42  MCV 92 90.9  --   PLT 259 236 278   Lipid Panel:  Recent Labs  01/31/16 0855 06/01/16  CHOL 177 154  HDL 73 73*  LDLCALC 89 69  TRIG 75 58  CHOLHDL 2.4  --    Lab Results  Component Value Date   HGBA1C 6.8 06/01/2016    Procedures since last visit: Ct Head Wo Contrast  05/24/2016  CLINICAL DATA:  Assisted living  patient who fell, with diminished mental status. EXAM: CT HEAD WITHOUT CONTRAST TECHNIQUE: Contiguous axial images were obtained from the base of the skull through the vertex without intravenous contrast. COMPARISON:  06/22/2013 FINDINGS: Generalized brain atrophy. No evidence of old or acute focal infarction, mass lesion, hemorrhage, hydrocephalus or extra-axial collection. No skull fracture. Sinuses, middle ears and mastoids are clear. There is atherosclerotic calcification of the major vessels at the base of the brain. IMPRESSION: No acute or traumatic finding.  Age related atrophy. Electronically Signed   By: Paulina FusiMark  Shogry M.D.   On: 05/24/2016 19:19    Assessment/Plan 1. Benign essential hypertension -bp at goal on current regimen, cont the same and monitor  2. Type 2 diabetes mellitus without complication, without long-term current use of insulin (HCC) -well controlled with oral agents -doubt her diet is appropriate at her ALF, but has the order for diabetic low sodium diet - cont metformin and glipizide -has not had hypoglycemia  3. Hypothyroidism, unspecified hypothyroidism type -TSH at goal, cont same synthroid and monitor  4. Parkinson's disease (tremor, stiffness, slow motion, unstable posture) (HCC) -gradually worsening -cont sinemet per neuro -cont walker use at all times  5. Primary osteoarthritis of right knee -doing a little better now (not too far out from her last knee injection)--gets at ortho  6. Macular degeneration -had been worsening so she's now getting injections for treatment and notes improvement since her second one  7. Memory loss -gradually worsening as welll -her  sister comes to visits and fills in gaps -not on meds, but does have some dementia (requires cues for some personal care and medication mgt, help at appts)  Labs/tests ordered:  Bmp, hba1c before outside facility and send copy here Next appt:  10/14/2016 for annual   Mikale Silversmith L. Kaliope Quinonez,  D.O. Geriatrics [facility] [address], [place] [address] Cell Phone (Mon-Fri 8am-5pm):  [phone] On Call:  [phone] & follow prompts after 5pm & weekends Office Phone:  [phone] Office Fax:  [phone]

## 2016-06-13 ENCOUNTER — Telehealth: Payer: Self-pay | Admitting: *Deleted

## 2016-06-13 NOTE — Telephone Encounter (Signed)
Received fax from [facility] #[phone] for Dr. Renato Gails to review and sign FL2 Form and Standing House orders. Given to Dr. Renato Gails to review and sign.

## 2016-06-15 ENCOUNTER — Encounter: Payer: Self-pay | Admitting: Internal Medicine

## 2016-07-02 ENCOUNTER — Encounter: Payer: Self-pay | Admitting: Internal Medicine

## 2016-07-19 DIAGNOSIS — H353221 Exudative age-related macular degeneration, left eye, with active choroidal neovascularization: Secondary | ICD-10-CM | POA: Diagnosis not present

## 2016-08-23 DIAGNOSIS — Z23 Encounter for immunization: Secondary | ICD-10-CM | POA: Diagnosis not present

## 2016-08-28 ENCOUNTER — Other Ambulatory Visit: Payer: Self-pay | Admitting: *Deleted

## 2016-08-28 DIAGNOSIS — M170 Bilateral primary osteoarthritis of knee: Secondary | ICD-10-CM

## 2016-08-28 MED ORDER — TRAMADOL HCL 50 MG PO TABS: ORAL_TABLET | ORAL | 5 refills | Status: DC

## 2016-08-28 NOTE — Telephone Encounter (Signed)
[facility] [phone] Fax:[phone] sent fax requesting RF on medication.

## 2016-09-05 ENCOUNTER — Telehealth: Payer: Self-pay | Admitting: Internal Medicine

## 2016-09-05 NOTE — Telephone Encounter (Signed)
left msg asking pt to call and schedule AWV. VDM (dee-dee)

## 2016-09-06 DIAGNOSIS — H353221 Exudative age-related macular degeneration, left eye, with active choroidal neovascularization: Secondary | ICD-10-CM | POA: Diagnosis not present

## 2016-09-12 ENCOUNTER — Encounter: Payer: Self-pay | Admitting: Neurology

## 2016-09-12 ENCOUNTER — Ambulatory Visit (INDEPENDENT_AMBULATORY_CARE_PROVIDER_SITE_OTHER): Payer: Medicare Other | Admitting: Neurology

## 2016-09-12 VITALS — BP 140/88 | HR 76 | Resp 20 | Ht 63.0 in | Wt 148.0 lb

## 2016-09-12 DIAGNOSIS — R269 Unspecified abnormalities of gait and mobility: Secondary | ICD-10-CM | POA: Diagnosis not present

## 2016-09-12 DIAGNOSIS — G20 Parkinson's disease: Secondary | ICD-10-CM | POA: Diagnosis not present

## 2016-09-12 DIAGNOSIS — Z9181 History of falling: Secondary | ICD-10-CM | POA: Diagnosis not present

## 2016-09-12 DIAGNOSIS — R296 Repeated falls: Secondary | ICD-10-CM | POA: Diagnosis not present

## 2016-09-12 MED ORDER — CARBIDOPA-LEVODOPA 25-100 MG PO TABS: 1.0000 | ORAL_TABLET | Freq: Four times a day (QID) | ORAL | 5 refills | Status: AC

## 2016-09-12 NOTE — Telephone Encounter (Signed)
left another msg asking if pt can come at 1:00 on 11/27 to meet with nurse first for AWV. VDM (DD)

## 2016-09-12 NOTE — Progress Notes (Signed)
Subjective:    Patient ID: Kara Moore is a 81 y.o. female.  HPI     Interim history:   Kara Moore is a very pleasant 81 year old right-handed woman with an underlying medical history of diabetes, hyperlipidemia, hypothyroidism, vitamin D deficiency, reflux disease, and hypertension and osteoporosis who presents for followup consultation of her gait disorder and parkinsonism, probable L sided predominant PD, complicated by recurrent falls. She is accompanied by her niece, Kara Moore, again today. I last saw her on 05/08/2016, at which time she reported right knee pain and leg cramps on the right side. She was not always hydrating well enough, was using the rolling walker. Thankfully she had no recent falls. She had injection into her left eye for her macular degeneration. She was no longer on trazodone was taking Remeron 7.5 mg at night, which was reduced by her primary care physician.  Today, 09/12/2016: She reports doing okay. Had a fall on 05/24/16, had R knee injection earlier that day, fell in bathroom or off commode, unclear. Per [facility] was taken to ER. I reviewed the ER note, CTH neg for acute changes on 05/24/16, age related atrophy. Does not have much in the way of pain on an ongoing or daily basis. Of note, takes tramadol 50 mg's twice daily on a scheduled basis. Has been on Sinemet 4 times a day, motor-wise fairly stable, not much constipation typically, does not always drink enough water and may not always use her walker at all times per niece. Had another injection for macular degeneration.  Previously:   I saw her on 12/20/2015, at which time she reported more eye problems. She was on tramadol 50 mg bid, remeron 15 mg at night, trazodone 50 mg, C/L 3 times a day, written for 8 AM, 2 PM and 8 PM. She has some knee pain. Thankfully, she had not fallen.    I saw her on 08/15/2015, at which time she was doing fairly well, her niece agreed. She had no recent falls. She was using her  walker. Her diabetes numbers had improved and her memory was stable. She felt that her appetite was good but she does not always drink enough water. She was advised to continue with Sinemet 3 times a day and use her walker at all times. She was encouraged to drink more water. I ordered physical therapy and occupational therapy at [facility].   I saw her on 04/10/2015, at which time she reported right knee pain. She did not go back to orthopedics. She was seen by her PCP recently. She was still residing at [facility]. She had occasional left arm and hand tremor, especially worse when tired. She had no recent falls. Her latest A1c in March 2016 was 8.7. She had left eye surgery for removal of the chalazion. Kara Moore had no new concerns. Her memory was felt to be stable. Her MMSE was 29/30, CDT: 4/4, AFT: 12/min. I suggested she continue with Sinemet 1 pill 3 times a day.    I saw her on 12/08/2014, at which time her niece reported several falls. She was drooling more. She had knee pain and swelling. She had gone to the emergency room. She had seen orthopedics and received injections into both knees which improved her pain significantly. They were considering Synvisc injections. She had had some therapy at [facility]. She was not drinking enough water. She was using a 2 wheeled walker more consistently. She went to [facility] and had  x-rays of her knees and ultrasound of her legs which did not show any clots per Kara Moore. I suggested we increase her Sinemet to 1 pill 3 times a day. I asked her to use her 2 wheeled walker consistently.    I saw her on 06/10/2014, at which time Kara Moore reported that they did not fill the order for the walker. The patient had a steroid injection into the right knee which helped marginally. She was reporting more knee pain on the left. She had an x-ray of her right knee. I asked her to use a rolling walker at all times for gait safety. We talked  about her gait disorder and the complexity of her situation. I continued low-dose Sinemet.   I saw her on 01/18/2014, at which time we talked about her using her walker at all times. She was on Remeron for sleep. I prescribed a rolling walker with seat. She indicated that she would not want to use a walker.    I saw her on 09/09/2013, at which time I felt that her gait disorder with recurrent falls was out of proportion to her parkinsonism. I felt that her gait disorder was a function of advancing age, previous pelvic fracture, and arthritis. She had finished inpatient rehabilitation at the time. We talked about her recent additional testing including head CT, EEG, carotid Doppler study, cardiac monitor, all of which were unrevealing. She was advised to continue using her 2 wheeled walker at all times.   I saw her on 06/17/2013 after a recent fall and suggested further workup for a recent episode of confusion and her recurrent falls. I ordered CTH, EEG, C. Doppler, cardiac monitor and suggested a referral to geriatrics. She has since then started seeing Kara Moore.   Her head CT from 06/22/2013 was negative for any acute findings.   Her EEG from 06/25/2013 was normal in the awake and sleep states.   Her carotid Doppler study from 06/25/2013 was negative for any significant stenoses.   Her cardiac event monitor from 07/01/2013 through 07/30/2013 was consistent with normal sinus rhythm with occasional PVCs, no evidence of atrial fibrillation. This was interpreted by Dr. Dola Moore.   She had an Echocardiogram on 04/15/13 at the [facility] in [place]. She has arthritis, in her R knee and had 2 injections before.   She lives at [facility] in [place]. She feels like "I have legs again". She is tolerating the C/L 25/100 mg 1/2 tid.   I first met her on 03/26/2013 at which time her niece and the patient reported a approximately two-month history of decrease in fine motor skills, decrease in  mobility, gait changes and recurrent falls. She reported becoming slower than usual taking longer to dress herself, needing assistance with some of her ADLs. She was started on C/L at the time. She has had urinary urgency. She recently had physical therapy and her therapist noted lack of ability, inability to dress herself and drooling. The patient had to give up driving.   She had a MRI brain on 04/08/13: Abnormal MRI scan of the brain showing mild changes of age-appropriate chronic microvascular ischemia and generalized cerebral atrophy.   On 02/11/13 she fell out of her bed and had difficulty getting back up. Her nephew was visiting from [place], but he could not hear her, as he is hard of hearing and she was calling in a soft voice. She developed rhabdomyolysis. In the latter part of April she was taken to [facility]  in [place], then to the ER for suspected cellulitis. She had an US of the LEs and did not have a blood clot. She was given lasix for swelling. The next day she fell, and then again, and then again the next day. After these falls, she started having care 24/7 through [facility]. She has been in PT since the end of April. She has been using a 2 wheeled walker since then.   She has a strong FHx of tremors on her mother's side (including M, MGM and cousins). Both of her siblings passed away.   She never had a TIA or a stroke.   She fell at home on 04/30/13, while coming out of the bathroom. She has been having issues with constipation and had taken Dulcolax suppositories, but also took Mag citrate and her niece found her on the floor and she had soiled herself and for some reason had not pushed her Lifeline button. She denied hitting her head or LOC. Her niece called 46 and she was taken to [facility] to the ER and was discharged to home from there. Then she was admitted to inpt rehab at [facility] center on 05/02/13. She fell while in rehab and was found to have a pelvic Fx b/l, reportedly  old. She has had some confusion intermittently.   Her Past Medical History Is Significant For: Past Medical History:  Diagnosis Date  . Benign essential hypertension   . Confusion   . Diabetes mellitus without complication ([place])   . Hyperlipidemia LDL goal < 100   . Hypothyroidism   . Macular degeneration   . Memory loss   . Senile osteoporosis     Her Past Surgical History Is Significant For: Past Surgical History:  Procedure Laterality Date  . ADENOIDECTOMY  2011    Her Family History Is Significant For: Family History  Problem Relation Age of Onset  . Tremor Maternal Grandmother   . Diabetes Paternal Grandfather   . Heart attack Father   . Cancer Sister   . Heart attack Brother   . Diabetes Brother   . Tremor Maternal Aunt   . Diabetes Paternal Grandmother     Her Social History Is Significant For: Social History   Social History  . Marital status: Single    Spouse name: N/A  . Number of children: N/A  . Years of education: N/A   Social History Main Topics  . Smoking status: Never Smoker  . Smokeless tobacco: Never Used  . Alcohol use No  . Drug use: No  . Sexual activity: Not Asked   Other Topics Concern  . None   Social History Narrative  . None    Her Allergies Are:  No Known Allergies:   Her Current Medications Are:  Outpatient Encounter Prescriptions as of 09/12/2016  Medication Sig  . acetaminophen (TYLENOL) 500 MG tablet Take 500 mg by mouth as needed.   Marland Kitchen alum & mag hydroxide-simeth (MINTOX) 627-035-00 MG/5ML suspension Take 30 mLs by mouth every 6 (six) hours as needed for indigestion or heartburn.  Marland Kitchen aspirin 81 MG chewable tablet Chew 81 mg by mouth daily.   . calcium carbonate (OS-CAL - DOSED IN MG OF ELEMENTAL CALCIUM) 1250 (500 CA) MG tablet Take 1 tablet by mouth daily.   . carbidopa-levodopa (SINEMET IR) 25-100 MG tablet Take 1 tablet by mouth 4 (four) times daily. Take at 8, 12, 4 PM, 8 PM.  . Cholecalciferol 2000 UNITS CAPS Take  1 capsule (2,000  Units total) by mouth daily.  . fluticasone (FLONASE) 50 MCG/ACT nasal spray Place 2 sprays into the nose as needed.   . furosemide (LASIX) 20 MG tablet Take 1/2 tablet orally every day  . glipiZIDE (GLUCOTROL) 5 MG tablet Take 5 mg by mouth daily before breakfast. For Diabetes (elevated blood sugar)  . hydrocortisone (ANUSOL-HC) 2.5 % rectal cream Place 1 application rectally as needed for hemorrhoids or itching.  . levothyroxine (SYNTHROID, LEVOTHROID) 50 MCG tablet TAKE 1 TABLET BY MOUTH DAILY BEFORE BREAKFAST ON EMPTY STOMACH *BINGO CARD*  . loperamide (IMODIUM) 2 MG capsule Take 2 mg by mouth. Take one tablet orally with each loose stool as need for diarrhea * NTE 8 doses in 24 hours*  . magnesium hydroxide (MILK OF MAGNESIA) 400 MG/5ML suspension Take by mouth daily as needed for constipation.  . metFORMIN (GLUCOPHAGE) 500 MG tablet Take 1 tablet (500 mg total) by mouth 2 (two) times daily with a meal.  . mirtazapine (REMERON) 7.5 MG tablet Take 1 tablet (7.5 mg total) by mouth at bedtime.  . Multiple Vitamins-Minerals (ICAPS MV) TABS Take 2 tablets by mouth daily.   . potassium chloride (K-DUR,KLOR-CON) 10 MEQ tablet Take 10 mEq by mouth daily. Take to prevent potassium loss (due to taking fluid pill)  . pravastatin (PRAVACHOL) 80 MG tablet Take 1 tablet (80 mg total) by mouth daily. For Cholesterol  . ranitidine (ZANTAC) 150 MG capsule Take 150 mg by mouth at bedtime.   . traMADol (ULTRAM) 50 MG tablet Take one tablet by mouth twice daily for pain   No facility-administered encounter medications on file as of 09/12/2016.   :  Review of Systems:  Out of a complete 14 point review of systems, all are reviewed and negative with the exception of these symptoms as listed below: Review of Systems  Neurological:       Pt states that she had a fall on July the 7th. Pt is also complaining of frequent leg cramps. MMSE 23/30, CDT 3/4, AFT 12/min.    Objective:  Neurologic  Exam  Physical Exam Physical Examination:   Vitals:   09/12/16 1309  BP: 140/88  Pulse: 76  Resp: 20   General Examination: The patient is a very pleasant 81 y.o. female in no acute distress. She is calm and cooperative with the exam. She is in good spirits today, but quieter than usual.   HEENT: Normocephalic, atraumatic, pupils are equal, round and reactive to light and accommodation. Mild scleral hemorrhage on L, no pain reported. Extraocular tracking shows moderate saccadic breakdown without nystagmus noted. Hearing is intact. She has mild cataracts. Face is symmetric with moderate facial masking and normal facial sensation. Excess seborrhoic changes are seen on her face. There is no lip, neck or jaw tremor. Neck is moderately rigid with intact passive ROM. There are no carotid bruits on auscultation. Oropharynx exam reveals mild mouth dryness. No significant airway crowding is noted. Mallampati is class II. Tongue protrudes centrally and palate elevates symmetrically. No drooling is noted.  Chest: is clear to auscultation without wheezing, rhonchi or crackles noted.  Heart: sounds are regular and normal without murmur, no rubs or gallops noted, unchanged.   Abdomen: is soft, non-tender and non-distended with normal bowel sounds appreciated on auscultation.  Extremities: There is no edema in the lower extremities bilaterally. She is wearing compression stockings up to the knees.   Skin: is warm and dry with no trophic changes noted.   Musculoskeletal: exam reveals no obvious  joint deformities, tenderness or joint swelling or erythema, with the exception of right knee pain and mild knee swelling noted, some valgus deformity of R knee.   Neurologically:  Mental status: The patient is awake and alert, paying good attention. She is able to mostly provide the history. Her niece provides details. She is oriented to: person, place, time/date, situation, day of week, month of year and year.  Her memory, attention, language and knowledge are impaired, mildly. There is no aphasia, agnosia, apraxia or anomia. There is a mild degree of bradyphrenia. Speech is mildly hypophonic with no dysarthria noted. Mood is congruent and affect is normal.   In May 2014: Her MMSE score was 29/30. CDT 4/4. AFT (Animal Fluency Test) score was 14.  On 12/08/2014: MMSE:27/30, CDT: 1/4, AFT: 18/min.   On 04/10/2015: MMSE: 29/30, CDT: 4/4, AFT: 12/min.   On 12/20/2015: MMSE: 29/30, CDT: 4/4, AFT: 13/min.   On 09/12/2016: MMSE: 23/30, 4/4, AFT: 12/min.   Cranial nerves are as described above under HEENT exam. In addition, shoulder shrug is normal with equal shoulder height noted.  Motor exam: Normal bulk, and strength for age is noted. Tone is mildly rigid with absence of cogwheeling. There is overall moderate bradykinesia. There is no drift or rebound. There is a very intermittent and slight resting tremor in both upper extremity is, left more than right.     Romberg is negative. Reflexes are 1+ in the upper extremities and 1+ in the lower extremities. Fine motor skills: Finger taps, hand movements, and rapid alternating patting are moderately impaired on the L and mild to moderately impaired on the R. Foot taps and foot agility are moderately impaired bilaterally, L worse than R.    Cerebellar testing shows no dysmetria or intention tremor on finger to nose testing. There is no truncal or gait ataxia.   Sensory exam is intact to light touch in the UEs and LEs.    Gait, station and balance: she stands with mild difficulty and pushes herself up and takes 2 attempts, does not need help. She does not report knee pain with standing. She has a moderately stooped posture, mild lean to R. She walks fairly well with and her walker, but slower and turns somewhat more slowly in 5 steps but has no instability when turning. Her balance is mildly to moderately impaired. May have mild veering to the L.  Assessment and  Plan:   In summary, JARED CAHN is a very pleasant 81 year old female with an underlying medical history of diabetes, hyperlipidemia, hypothyroidism, vitamin D deficiency, reflux disease, and hypertension and osteoporosis who presents for followup consultation of her gait disorder and parkinsonism, probable L sided predominant PD, complicated by recurrent falls, including fall with injury (suffered a pelvic fracture in the past). She has been using her 2 wheeled walker or her 4 wheeled walker more consistently, but took a fall in July 2017, thankfully without injury. She reports worsening leg cramps. For knee pain she has been seeing orthopedics at [facility] clinic. She had received injections in December 2015 and also July 2017. She is on tramadol daily, 50 mg twice daily, I'm wondering if she needs to have an as a standing prescription, I have encouraged the patient and her niece to discuss this with her primary care physician, Kara Moore. Furthermore, her memory scores have gradually declined, and I suggested we could try her on low-dose generic Namenda 5 mg once daily for now with gradual and cautious increase. I went  over potential side effects and patient prefers to discuss this first with primary care physician. She has an appointment coming up in one month and we can certainly wait until then, Kara Moore can also prescribed at the time. I would suggest a very slow increase and monitoring for side effects given the age factor and Parkinson's disease. I suggested a four-month checkup, sooner as needed. I've also encouraged her to make an appointment with orthopedics again. It sounds like she had good success with knee injections. As far as I recall, she did not want to pursue Synvisc injections, however. She continues to be at risk for falls. Her gait disorder is likely a function of multiple additional issues including advancing age, previous pelvic fracture, arthritis, especially affecting her knees and  tendency towards dehydration no also a tendency to lean to one side. She continues to reside in assisted living at [facility] and has had some in-house physical therapy.  I suggested she continue with Sinemet 1 pill 4 times a day and encouraged her to drink more water, advised her to continue to use her walker at all times.  In the past, she had testing including CTH, EEG, C. Doppler, cardiac monitor, all of which were unrevealing. I would like to see her back in about 4 months from now, sooner if the need arises. I answered all their questions today and the patient and Kara Moore were in agreement.  I spent 25 minutes in total face-to-face time with the patient, more than 50% of which was spent in counseling and coordination of care, reviewing test results, reviewing medication and discussing or reviewing the diagnosis of PD, its prognosis and treatment options.

## 2016-09-12 NOTE — Patient Instructions (Addendum)
We will continue your sinemet at 1 pill 4 times a day.  Please discuss with Dr. Renato Gailseed, the ongoing need for scheduled tramadol twice daily.  You may need to see the orthopedic doctor/PA again. I would like for us to start you on a memory medication, called Namenda 5 mg once daily with gradual build up. Side effects may include: headaches, nausea, confusion, hallucination, personality changes. If you are having mild side effects, try to stick with the treatment as these initial side effects may go away after the first 10-14 days.   As you indicate, that you would like to check with Dr. Renato Gailseed first, I will hold off on a prescription for now, as you have an appointment coming up in one month with her. She can prescribe this medication as well.

## 2016-10-04 ENCOUNTER — Other Ambulatory Visit: Payer: Self-pay | Admitting: Internal Medicine

## 2016-10-04 DIAGNOSIS — N189 Chronic kidney disease, unspecified: Secondary | ICD-10-CM | POA: Diagnosis not present

## 2016-10-04 DIAGNOSIS — E119 Type 2 diabetes mellitus without complications: Secondary | ICD-10-CM | POA: Diagnosis not present

## 2016-10-05 LAB — HGB A1C W/O EAG: Hgb A1c MFr Bld: 6.7 % — ABNORMAL HIGH (ref 4.8–5.6)

## 2016-10-05 LAB — BASIC METABOLIC PANEL
BUN/Creatinine Ratio: 17 (ref 12–28)
BUN: 12 mg/dL (ref 8–27)
CO2: 24 mmol/L (ref 18–29)
Calcium: 9.7 mg/dL (ref 8.7–10.3)
Chloride: 98 mmol/L (ref 96–106)
Creatinine, Ser: 0.69 mg/dL (ref 0.57–1.00)
GFR calc Af Amer: 96 mL/min/{1.73_m2} (ref >59)
GFR calc non Af Amer: 83 mL/min/{1.73_m2} (ref >59)
Glucose: 142 mg/dL — ABNORMAL HIGH (ref 65–99)
Potassium: 4.6 mmol/L (ref 3.5–5.2)
Sodium: 139 mmol/L (ref 134–144)

## 2016-10-07 ENCOUNTER — Telehealth: Payer: Self-pay

## 2016-10-07 NOTE — Telephone Encounter (Signed)
Nurse from [facility] called, patient's right foot hurting, some redness early, started this morning. This happens off and on, but today it hurts more than any other time.. Offer appt for this morning with Abbey ChattersJessica Eubanks NP at 11:30. She asked the patient, she will have to call her sister and get back with Kara Moore.

## 2016-10-14 ENCOUNTER — Encounter: Payer: Self-pay | Admitting: Internal Medicine

## 2016-10-14 ENCOUNTER — Ambulatory Visit (INDEPENDENT_AMBULATORY_CARE_PROVIDER_SITE_OTHER): Payer: Medicare Other

## 2016-10-14 ENCOUNTER — Ambulatory Visit (INDEPENDENT_AMBULATORY_CARE_PROVIDER_SITE_OTHER): Payer: Medicare Other | Admitting: Internal Medicine

## 2016-10-14 VITALS — BP 142/80 | HR 78 | Temp 97.4°F | Ht 63.0 in | Wt 145.8 lb

## 2016-10-14 VITALS — BP 142/80 | HR 78 | Temp 97.4°F | Wt 145.0 lb

## 2016-10-14 DIAGNOSIS — G20 Parkinson's disease: Secondary | ICD-10-CM | POA: Diagnosis not present

## 2016-10-14 DIAGNOSIS — M79661 Pain in right lower leg: Secondary | ICD-10-CM | POA: Diagnosis not present

## 2016-10-14 DIAGNOSIS — R413 Other amnesia: Secondary | ICD-10-CM | POA: Diagnosis not present

## 2016-10-14 DIAGNOSIS — G20A1 Parkinson's disease without dyskinesia, without mention of fluctuations: Secondary | ICD-10-CM

## 2016-10-14 DIAGNOSIS — I10 Essential (primary) hypertension: Secondary | ICD-10-CM | POA: Diagnosis not present

## 2016-10-14 DIAGNOSIS — E119 Type 2 diabetes mellitus without complications: Secondary | ICD-10-CM | POA: Diagnosis not present

## 2016-10-14 DIAGNOSIS — Z0000 Encounter for general adult medical examination without abnormal findings: Secondary | ICD-10-CM | POA: Diagnosis not present

## 2016-10-14 DIAGNOSIS — E039 Hypothyroidism, unspecified: Secondary | ICD-10-CM | POA: Diagnosis not present

## 2016-10-14 DIAGNOSIS — D692 Other nonthrombocytopenic purpura: Secondary | ICD-10-CM

## 2016-10-14 DIAGNOSIS — M170 Bilateral primary osteoarthritis of knee: Secondary | ICD-10-CM | POA: Diagnosis not present

## 2016-10-14 DIAGNOSIS — N3281 Overactive bladder: Secondary | ICD-10-CM

## 2016-10-14 MED ORDER — MEMANTINE HCL 5 MG PO TABS: 5.0000 mg | ORAL_TABLET | Freq: Every day | ORAL | 3 refills | Status: DC

## 2016-10-14 MED ORDER — TRAMADOL HCL 50 MG PO TABS: 50.0000 mg | ORAL_TABLET | Freq: Every day | ORAL | 5 refills | Status: DC

## 2016-10-14 NOTE — Progress Notes (Signed)
Provider:  Gwenith Spitziffany L. Renato Gailseed, D.O., C.M.D. Location:   [facility]   Place of Service:  Clinic  Previous PCP: Bufford SpikesEED, Shonta Bourque, DO Patient Care Team: Kermit Baloiffany L Abdias Hickam, DO as PCP - General (Geriatric Medicine) Lockie Molahadwick Brasington, MD as Referring Physician (Ophthalmology)  Extended Emergency Contact Information Primary Emergency Contact: PopponessetGreen,[name] Address: [address]          [place], [place] [address] [place] Home Phone: [phone] Mobile Phone: [phone] Relation: Niece Secondary Emergency Contact: Lenward ChancellorFaucette,Sandra Address: [address]          [place], [place] [address] [place] Home Phone: [phone] Mobile Phone: [phone] Relation: Niece  Code Status: DNR Goals of Care: Advanced Directive information Advanced Directives 10/14/2016  Does Patient Have a Medical Advance Directive? Yes  Type of Advance Directive Healthcare Power of Attorney  Does patient want to make changes to medical advance directive? -  Copy of Healthcare Power of Attorney in Chart? Yes   Chief Complaint  Patient presents with  . Medical Management of Chronic Issues    extended visit    HPI: Patient is a 81 y.o. female seen today for an "extended visit".    Her memory has been worsening and Dr. Frances FurbishAthar has recommended she begin namenda 5mg  daily which I agree with.  Pt notes she is more forgetful also.    She also has been having spasms of her leg.  Has had them all morning.  She says she left the breakfast table this am, and by the time she reached her room, she was hurting badly.  She did get relief with tramadol this am.   The right knee does swell routinely.  She still does not think it's bad enough to go to ortho for a shot.    Is having urinary frequency at hs.  Thinks she has to go, only goes a little at a time.  Also goes often in the daytime.  Urgent also.  No dysuria.  No abdominal pain.  Discussed myrbetriq (others too anticholinergic).  We don't want to start two  new pills at once and have not checked a urine sample.    Has barely touched anything and has a bruise on her left arm and hand. Asks what from.  Discussed aging process and thinning skin.  Past Medical History:  Diagnosis Date  . Benign essential hypertension   . Confusion   . Diabetes mellitus without complication (HCC)   . Hyperlipidemia LDL goal < 100   . Hypothyroidism   . Macular degeneration   . Memory loss   . Senile osteoporosis    Past Surgical History:  Procedure Laterality Date  . ADENOIDECTOMY  2011    reports that she has never smoked. She has never used smokeless tobacco. She reports that she does not drink alcohol or use drugs.  Functional Status Survey:    Family History  Problem Relation Age of Onset  . Heart attack Father   . Cancer Sister   . Heart attack Brother   . Diabetes Brother   . Tremor Maternal Grandmother   . Diabetes Paternal Grandfather   . Tremor Maternal Aunt   . Diabetes Paternal Grandmother     Health Maintenance  Topic Date Due  . URINE MICROALBUMIN  10/29/2015  . FOOT EXAM  10/01/2016  . ZOSTAVAX  11/17/2017 (Originally 11/22/1996)  . TETANUS/TDAP  11/17/2017 (Originally 02/16/2014)  . HEMOGLOBIN A1C  04/03/2017  . OPHTHALMOLOGY EXAM  04/16/2017  . INFLUENZA VACCINE  Addressed  . DEXA SCAN  Completed  . PNA vac Low Risk Adult  Completed    No Known Allergies    Medication List       Accurate as of 10/14/16  2:03 PM. Always use your most recent med list.          acetaminophen 500 MG tablet Commonly known as:  TYLENOL Take 500 mg by mouth as needed.   aspirin 81 MG chewable tablet Chew 81 mg by mouth daily.   calcium carbonate 1250 (500 Ca) MG tablet Commonly known as:  OS-CAL - dosed in mg of elemental calcium Take 1 tablet by mouth daily.   carbidopa-levodopa 25-100 MG tablet Commonly known as:  SINEMET IR Take 1 tablet by mouth 4 (four) times daily. Take at 8, 12, 4 PM, 8 PM.   Cholecalciferol 2000 units  Caps Take 1 capsule (2,000 Units total) by mouth daily.   fluticasone 50 MCG/ACT nasal spray Commonly known as:  FLONASE Place 2 sprays into the nose as needed.   furosemide 20 MG tablet Commonly known as:  LASIX Take 1/2 tablet orally every day   glipiZIDE 5 MG tablet Commonly known as:  GLUCOTROL Take 5 mg by mouth daily before breakfast. For Diabetes (elevated blood sugar)   ICAPS MV Tabs Take 2 tablets by mouth daily.   levothyroxine 50 MCG tablet Commonly known as:  SYNTHROID, LEVOTHROID TAKE 1 TABLET BY MOUTH DAILY BEFORE BREAKFAST ON EMPTY STOMACH *BINGO CARD*   loperamide 2 MG capsule Commonly known as:  IMODIUM Take 2 mg by mouth. Take one tablet orally with each loose stool as need for diarrhea * NTE 8 doses in 24 hours*   magnesium hydroxide 400 MG/5ML suspension Commonly known as:  MILK OF MAGNESIA Take by mouth daily as needed for constipation.   metFORMIN 500 MG tablet Commonly known as:  GLUCOPHAGE Take 1 tablet (500 mg total) by mouth 2 (two) times daily with a meal.   MINTOX 200-200-20 MG/5ML suspension Generic drug:  alum & mag hydroxide-simeth Take 30 mLs by mouth every 6 (six) hours as needed for indigestion or heartburn.   mirtazapine 7.5 MG tablet Commonly known as:  REMERON Take 1 tablet (7.5 mg total) by mouth at bedtime.   potassium chloride 10 MEQ tablet Commonly known as:  K-DUR,KLOR-CON Take 10 mEq by mouth daily. Take to prevent potassium loss (due to taking fluid pill)   pravastatin 80 MG tablet Commonly known as:  PRAVACHOL Take 1 tablet (80 mg total) by mouth daily. For Cholesterol   ranitidine 150 MG capsule Commonly known as:  ZANTAC Take 150 mg by mouth at bedtime.   traMADol 50 MG tablet Commonly known as:  ULTRAM Take one tablet by mouth twice daily for pain       Review of Systems  Constitutional: Positive for malaise/fatigue. Negative for chills and fever.  HENT: Positive for hearing loss.   Respiratory: Negative  for shortness of breath.   Cardiovascular: Negative for chest pain, palpitations and leg swelling.  Gastrointestinal: Positive for constipation. Negative for abdominal pain, blood in stool and melena.  Genitourinary: Positive for frequency and urgency. Negative for dysuria, flank pain and hematuria.  Musculoskeletal: Positive for joint pain and myalgias. Negative for falls.  Skin: Negative for itching and rash.       Easy bruising on arms and hands  Neurological: Positive for weakness. Negative for dizziness and loss of consciousness.  Endo/Heme/Allergies: Bruises/bleeds easily.  Psychiatric/Behavioral: Positive for memory loss. Negative  for depression. The patient is not nervous/anxious.     Vitals:   10/14/16 1357  BP: (!) 142/80  Pulse: 78  Temp: 97.4 F (36.3 C)  TempSrc: Oral  SpO2: 97%  Weight: 145 lb (65.8 kg)   Body mass index is 25.69 kg/m. Physical Exam  Constitutional: She is oriented to person, place, and time. She appears well-developed and well-nourished. No distress.  HENT:  Head: Normocephalic and atraumatic.  Cardiovascular: Normal rate, regular rhythm, normal heart sounds and intact distal pulses.   Pulmonary/Chest: Effort normal and breath sounds normal. No respiratory distress.  Abdominal: Bowel sounds are normal. There is no tenderness.  Musculoskeletal: Normal range of motion.  Slow shuffling gait with rolling walker with skis  Neurological: She is alert and oriented to person, place, and time.  Skin: Skin is warm and dry.  Small purple ecchymoses on left arm  Psychiatric: She has a normal mood and affect.  Masked facies    Labs reviewed: Basic Metabolic Panel:  Recent Labs  14/78/29 0855 05/24/16 1915 06/01/16 10/04/16 0850  NA 140 135 136* 139  K 4.2 4.2 4.5 4.6  CL 98 101  --  98  CO2 24 23  --  24  GLUCOSE 144* 232*  --  142*  BUN 11 19 15 12   CREATININE 0.71 0.51 0.7 0.69  CALCIUM 9.3 9.7  --  9.7   Liver Function Tests: No results  for input(s): AST, ALT, ALKPHOS, BILITOT, PROT, ALBUMIN in the last 8760 hours. No results for input(s): LIPASE, AMYLASE in the last 8760 hours. No results for input(s): AMMONIA in the last 8760 hours. CBC:  Recent Labs  05/24/16 1915 06/01/16  WBC 5.9 8.5  NEUTROABS 5.4  --   HGB 15.0 14.3  HCT 43.9 42  MCV 90.9  --   PLT 236 278   Cardiac Enzymes:  Recent Labs  05/24/16 1915  TROPONINI <0.03   BNP: Invalid input(s): POCBNP Lab Results  Component Value Date   HGBA1C 6.7 (H) 10/04/2016   Lab Results  Component Value Date   TSH 2.59 06/01/2016   Lab Results  Component Value Date   VITAMINB12 402 08/30/2013   Lab Results  Component Value Date   FOLATE 18.9 08/30/2013   Notes from Dr. Frances Furbish reviewed  Assessment/Plan 1. Parkinson's disease (tremor, stiffness, slow motion, unstable posture) (HCC) -cont current sinemet therapy per neurology -she did not participate in her PT that I ordered last time  2. Type 2 diabetes mellitus without complication, without long-term current use of insulin (HCC) -well controlled with current therapy and w/o hypoglycemia  3. Hypothyroidism, unspecified type -cont current synthroid,  Lab Results  Component Value Date   TSH 2.59 06/01/2016    4. Essential hypertension, benign -bp satisfactory with h/o orthostatic hypotension, cont same regimen  5. Right calf pain -seems related to her right knee OA -needs to f/u with ortho for another injection, but keeps putting it off and c/o pain -will change her regimen due to sleepiness with tramadol in the daytime -will schedule 1000mg  tylenol at breakfast and supper and then tramadol just at bedtime  6. Memory loss - memantine (NAMENDA) 5 MG tablet; Take 1 tablet (5 mg total) by mouth daily.  Dispense: 30 tablet; Refill: 3 -f/u in 6 wks, if doing well might increase to bid  7. OAB (overactive bladder) - will check urine sample to ensure there is nothing else causing this, but  suspect a bit of neurogenic bladder from her  PD  - Urinalysis with Reflex Microscopic -if unremarkable, might consider low dose myrbetriq if Dr. Frances Furbish is ok with this  8. Primary osteoarthritis of both knees - decrease tramadol and schedule tylenol again (had this in the past, but was not getting relief) -needs f/u with ortho for knee injection - traMADol (ULTRAM) 50 MG tablet; Take 1 tablet (50 mg total) by mouth at bedtime. Take one tablet by mouth twice daily for pain  Dispense: 30 tablet; Refill: 5  9. Senile purpura (HCC) -noted on left arm today and pt asked about this--educated on age-related process  Labs/tests ordered:   Orders Placed This Encounter  Procedures  . Urinalysis with Reflex Microscopic   Korry Dalgleish L. Nathanal Hermiz, D.O. Geriatrics [facility] [address], [place] [address] Cell Phone (Mon-Fri 8am-5pm):  [phone] On Call:  [phone] & follow prompts after 5pm & weekends Office Phone:  [phone] Office Fax:  [phone]

## 2016-10-14 NOTE — Patient Instructions (Addendum)
[name] , Thank you for taking time to come for your Medicare Wellness Visit. I appreciate your ongoing commitment to your health goals. Please review the following plan we discussed and let me know if I can assist you in the future.   These are the goals we discussed: Goals    . Increase water intake          Starting 10/14/16, I will attempt to drink 5 glasses of water per day.        This is a list of the screening recommended for you and due dates:  Health Maintenance  Topic Date Due  . Urine Protein Check  10/29/2015  . Complete foot exam   10/01/2016  . Shingles Vaccine  11/17/2017*  . Tetanus Vaccine  11/17/2017*  . Hemoglobin A1C  04/03/2017  . Eye exam for diabetics  04/16/2017  . Flu Shot  Addressed  . DEXA scan (bone density measurement)  Completed  . Pneumonia vaccines  Completed  *Topic was postponed. The date shown is not the original due date.  Preventive Care for Adults  A healthy lifestyle and preventive care can promote health and wellness. Preventive health guidelines for adults include the following key practices.  . A routine yearly physical is a good way to check with your health care provider about your health and preventive screening. It is a chance to share any concerns and updates on your health and to receive a thorough exam.  . Visit your dentist for a routine exam and preventive care every 6 months. Brush your teeth twice a day and floss once a day. Good oral hygiene prevents tooth decay and gum disease.  . The frequency of eye exams is based on your age, health, family medical history, use  of contact lenses, and other factors. Follow your health care provider's ecommendations for frequency of eye exams.  . Eat a healthy diet. Foods like vegetables, fruits, whole grains, low-fat dairy products, and lean protein foods contain the nutrients you need without too many calories. Decrease your intake of foods high in solid fats, added sugars, and  salt. Eat the right amount of calories for you. Get information about a proper diet from your health care provider, if necessary.  . Regular physical exercise is one of the most important things you can do for your health. Most adults should get at least 150 minutes of moderate-intensity exercise (any activity that increases your heart rate and causes you to sweat) each week. In addition, most adults need muscle-strengthening exercises on 2 or more days a week.  Silver Sneakers may be a benefit available to you. To determine eligibility, you may visit the website: www.silversneakers.com or contact program at [phone] Mon-Fri between 8AM-8PM.   . Maintain a healthy weight. The body mass index (BMI) is a screening tool to identify possible weight problems. It provides an estimate of body fat based on height and weight. Your health care provider can find your BMI and can help you achieve or maintain a healthy weight.   For adults 20 years and older: ? A BMI below 18.5 is considered underweight. ? A BMI of 18.5 to 24.9 is normal. ? A BMI of 25 to 29.9 is considered overweight. ? A BMI of 30 and above is considered obese.   . Maintain normal blood lipids and cholesterol levels by exercising and minimizing your intake of saturated fat. Eat a balanced diet with plenty of fruit and vegetables. Blood tests for lipids and cholesterol  should begin at age 27 and be repeated every 5 years. If your lipid or cholesterol levels are high, you are over 50, or you are at high risk for heart disease, you may need your cholesterol levels checked more frequently. Ongoing high lipid and cholesterol levels should be treated with medicines if diet and exercise are not working.  . If you smoke, find out from your health care provider how to quit. If you do not use tobacco, please do not start.  . If you choose to drink alcohol, please do not consume more than 2 drinks per day. One drink is considered to be 12 ounces  (355 mL) of beer, 5 ounces (148 mL) of wine, or 1.5 ounces (44 mL) of liquor.  . If you are 92-67 years old, ask your health care provider if you should take aspirin to prevent strokes.  . Use sunscreen. Apply sunscreen liberally and repeatedly throughout the day. You should seek shade when your shadow is shorter than you. Protect yourself by wearing long sleeves, pants, a wide-brimmed hat, and sunglasses year round, whenever you are outdoors.  . Once a month, do a whole body skin exam, using a mirror to look at the skin on your back. Tell your health care provider of new moles, moles that have irregular borders, moles that are larger than a pencil eraser, or moles that have changed in shape or color.

## 2016-10-14 NOTE — Progress Notes (Signed)
Quick Notes   Health Maintenance:   Received Flu shot at [facility]; will think about TDAP and Shingles. Due for Urine Micro.    Abnormal Screen:     Patient Concerns:  Pt's niece would like to discuss pt starting Namenda (per Neuro.) pt is having right leg spasms; Also would like to discuss Voiding at night and what she can do about it.     Nurse Concerns:  None

## 2016-10-14 NOTE — Progress Notes (Signed)
Subjective:   Kara Moore is a 81 y.o. female who presents for Medicare Annual (Subsequent) preventive examination.  Review of Systems:  Cardiac Risk Factors include: advanced age (>2455men, 73>65 women);diabetes mellitus;hypertension     Objective:     Vitals: BP (!) 142/80 (BP Location: Left Arm, Patient Position: Sitting, Cuff Size: Normal)   Pulse 78   Temp 97.4 F (36.3 C) (Oral)   Ht 5\' 3"  (1.6 m)   Wt 145 lb 12.8 oz (66.1 kg)   SpO2 97%   BMI 25.83 kg/m   Body mass index is 25.83 kg/m.   Tobacco History  Smoking Status  . Never Smoker  Smokeless Tobacco  . Never Used     Counseling given: No   Past Medical History:  Diagnosis Date  . Benign essential hypertension   . Confusion   . Diabetes mellitus without complication (HCC)   . Hyperlipidemia LDL goal < 100   . Hypothyroidism   . Macular degeneration   . Memory loss   . Senile osteoporosis    Past Surgical History:  Procedure Laterality Date  . ADENOIDECTOMY  2011   Family History  Problem Relation Age of Onset  . Heart attack Father   . Cancer Sister   . Heart attack Brother   . Diabetes Brother   . Tremor Maternal Grandmother   . Diabetes Paternal Grandfather   . Tremor Maternal Aunt   . Diabetes Paternal Grandmother    History  Sexual Activity  . Sexual activity: No    Outpatient Encounter Prescriptions as of 10/14/2016  Medication Sig  . acetaminophen (TYLENOL) 500 MG tablet Take 500 mg by mouth as needed.   Marland Kitchen. alum & mag hydroxide-simeth (MINTOX) 200-200-20 MG/5ML suspension Take 30 mLs by mouth every 6 (six) hours as needed for indigestion or heartburn.  Marland Kitchen. aspirin 81 MG chewable tablet Chew 81 mg by mouth daily.   . calcium carbonate (OS-CAL - DOSED IN MG OF ELEMENTAL CALCIUM) 1250 (500 CA) MG tablet Take 1 tablet by mouth daily.   . carbidopa-levodopa (SINEMET IR) 25-100 MG tablet Take 1 tablet by mouth 4 (four) times daily. Take at 8, 12, 4 PM, 8 PM.  . Cholecalciferol 2000  UNITS CAPS Take 1 capsule (2,000 Units total) by mouth daily.  . fluticasone (FLONASE) 50 MCG/ACT nasal spray Place 2 sprays into the nose as needed.   . furosemide (LASIX) 20 MG tablet Take 1/2 tablet orally every day  . glipiZIDE (GLUCOTROL) 5 MG tablet Take 5 mg by mouth daily before breakfast. For Diabetes (elevated blood sugar)  . levothyroxine (SYNTHROID, LEVOTHROID) 50 MCG tablet TAKE 1 TABLET BY MOUTH DAILY BEFORE BREAKFAST ON EMPTY STOMACH *BINGO CARD*  . loperamide (IMODIUM) 2 MG capsule Take 2 mg by mouth. Take one tablet orally with each loose stool as need for diarrhea * NTE 8 doses in 24 hours*  . magnesium hydroxide (MILK OF MAGNESIA) 400 MG/5ML suspension Take by mouth daily as needed for constipation.  . metFORMIN (GLUCOPHAGE) 500 MG tablet Take 1 tablet (500 mg total) by mouth 2 (two) times daily with a meal.  . mirtazapine (REMERON) 7.5 MG tablet Take 1 tablet (7.5 mg total) by mouth at bedtime.  . Multiple Vitamins-Minerals (ICAPS MV) TABS Take 2 tablets by mouth daily.   . potassium chloride (K-DUR,KLOR-CON) 10 MEQ tablet Take 10 mEq by mouth daily. Take to prevent potassium loss (due to taking fluid pill)  . pravastatin (PRAVACHOL) 80 MG tablet Take 1 tablet (  80 mg total) by mouth daily. For Cholesterol  . ranitidine (ZANTAC) 150 MG capsule Take 150 mg by mouth at bedtime.   . traMADol (ULTRAM) 50 MG tablet Take one tablet by mouth twice daily for pain  . [DISCONTINUED] hydrocortisone (ANUSOL-HC) 2.5 % rectal cream Place 1 application rectally as needed for hemorrhoids or itching.  . [DISCONTINUED] ranitidine (ZANTAC) 150 MG tablet Take 1 tablet by mouth at bedtime.   No facility-administered encounter medications on file as of 10/14/2016.     Activities of Daily Living In your present state of health, do you have any difficulty performing the following activities: 10/14/2016  Hearing? N  Vision? Y  Difficulty concentrating or making decisions? Y  Walking or climbing  stairs? Y  Dressing or bathing? Y  Doing errands, shopping? Y  Preparing Food and eating ? Y  Using the Toilet? Y  In the past six months, have you accidently leaked urine? Y  Do you have problems with loss of bowel control? N  Managing your Medications? Y  Managing your Finances? Y  Housekeeping or managing your Housekeeping? Y  Some recent data might be hidden    Patient Care Team: Kermit Balo, DO as PCP - General (Geriatric Medicine) Lockie Mola, MD as Referring Physician (Ophthalmology)    Assessment:    Exercise Activities and Dietary recommendations Current Exercise Habits: Home exercise routine, Type of exercise: walking, Time (Minutes): 15, Frequency (Times/Week): 3, Weekly Exercise (Minutes/Week): 45, Intensity: Mild  Goals    . Increase water intake          Starting 10/14/16, I will attempt to drink 5 glasses of water per day.       Fall Risk Fall Risk  10/14/2016 06/07/2016 05/08/2016 02/05/2016 10/02/2015  Falls in the past year? Yes Yes No No No  Number falls in past yr: 1 1 - - -  Injury with Fall? No No - - -  Risk for fall due to : Impaired balance/gait;Impaired vision - Impaired balance/gait - -  Follow up Falls prevention discussed - - - -   Depression Screen PHQ 2/9 Scores 10/14/2016 06/07/2016 02/05/2016 07/21/2014  PHQ - 2 Score 0 0 0 0     Cognitive Function MMSE - Mini Mental State Exam 09/12/2016 12/20/2015 04/10/2015 12/08/2014  Orientation to time 4 5 4 5   Orientation to Place 4 4 5 5   Registration 3 3 3 3   Attention/ Calculation 3 4 5 5   Recall 2 3 3 1   Language- name 2 objects 2 2 2 2   Language- repeat 0 1 1 1   Language- follow 3 step command 3 3 3 3   Language- read & follow direction 1 1 1 1   Write a sentence 1 1 1 1   Copy design 0 1 1 0  Total score 23 28 29 27         Immunization History  Administered Date(s) Administered  . Influenza-Unspecified 09/18/2013, 07/19/2014, 07/20/2015  . Pneumococcal Conjugate-13 10/28/2014    . Pneumococcal-Unspecified 11/18/2009  . Td 02/17/2004   Screening Tests Health Maintenance  Topic Date Due  . URINE MICROALBUMIN  10/29/2015  . FOOT EXAM  10/01/2016  . ZOSTAVAX  11/17/2017 (Originally 11/22/1996)  . TETANUS/TDAP  11/17/2017 (Originally 02/16/2014)  . HEMOGLOBIN A1C  04/03/2017  . OPHTHALMOLOGY EXAM  04/16/2017  . INFLUENZA VACCINE  Addressed  . DEXA SCAN  Completed  . PNA vac Low Risk Adult  Completed      Plan:    I have  personally reviewed and addressed the Medicare Annual Wellness questionnaire and have noted the following in the patient's chart:  A. Medical and social history B. Use of alcohol, tobacco or illicit drugs  C. Current medications and supplements D. Functional ability and status E.  Nutritional status F.  Physical activity G. Advance directives H. List of other physicians I.  Hospitalizations, surgeries, and ER visits in previous 12 months J.  Vitals K. Screenings to include hearing, vision, cognitive, depression L. Referrals and appointments - none  In addition, I have reviewed and discussed with patient certain preventive protocols, quality metrics, and best practice recommendations. A written personalized care plan for preventive services as well as general preventive health recommendations were provided to patient.  See attached scanned questionnaire for additional information.   Signed,   Nilda CalamityAlisa McCutcheon, LPN Health Advisor    I reviewed health advisor's note, was available for consultation and agree with the assessment and plan as written.  All of pt and niece's concerns were addressed during the extended visit that followed this.   Osiel Stick L. Juno Bozard, D.O. Geriatrics [facility] [address] [place], [place] [address] Cell Phone (Mon-Fri 8am-5pm):  [phone] On Call:  [phone] & follow prompts after 5pm & weekends Office Phone:  [phone] Office Fax:  [phone]

## 2016-10-15 LAB — URINALYSIS, MICROSCOPIC ONLY
Bacteria, UA: NONE SEEN [HPF]
Casts: NONE SEEN [LPF]
RBC / HPF: NONE SEEN RBC/HPF (ref <=2)
Yeast: NONE SEEN [HPF]

## 2016-10-15 LAB — URINALYSIS, ROUTINE W REFLEX MICROSCOPIC
Bilirubin Urine: NEGATIVE
Glucose, UA: NEGATIVE
Hgb urine dipstick: NEGATIVE
Nitrite: NEGATIVE
Protein, ur: NEGATIVE
Specific Gravity, Urine: 1.024 (ref 1.001–1.035)
pH: 6 (ref 5.0–8.0)

## 2016-11-08 DIAGNOSIS — H353221 Exudative age-related macular degeneration, left eye, with active choroidal neovascularization: Secondary | ICD-10-CM | POA: Diagnosis not present

## 2016-11-25 ENCOUNTER — Ambulatory Visit: Payer: Medicare Other | Admitting: Internal Medicine

## 2016-11-27 ENCOUNTER — Other Ambulatory Visit: Payer: Self-pay | Admitting: *Deleted

## 2016-11-27 DIAGNOSIS — M170 Bilateral primary osteoarthritis of knee: Secondary | ICD-10-CM

## 2016-11-27 MED ORDER — TRAMADOL HCL 50 MG PO TABS: ORAL_TABLET | ORAL | 0 refills | Status: DC

## 2016-11-27 NOTE — Telephone Encounter (Signed)
[facility] [phone] Fax: [phone]

## 2016-12-02 ENCOUNTER — Ambulatory Visit: Payer: Medicare Other | Admitting: Internal Medicine

## 2016-12-12 ENCOUNTER — Encounter: Payer: Self-pay | Admitting: Internal Medicine

## 2016-12-12 ENCOUNTER — Ambulatory Visit (INDEPENDENT_AMBULATORY_CARE_PROVIDER_SITE_OTHER): Payer: Medicare Other | Admitting: Internal Medicine

## 2016-12-12 VITALS — BP 128/70 | HR 65 | Temp 98.1°F | Wt 146.0 lb

## 2016-12-12 DIAGNOSIS — G20A1 Parkinson's disease without dyskinesia, without mention of fluctuations: Secondary | ICD-10-CM

## 2016-12-12 DIAGNOSIS — R413 Other amnesia: Secondary | ICD-10-CM | POA: Diagnosis not present

## 2016-12-12 DIAGNOSIS — N3946 Mixed incontinence: Secondary | ICD-10-CM | POA: Diagnosis not present

## 2016-12-12 DIAGNOSIS — G20 Parkinson's disease: Secondary | ICD-10-CM | POA: Diagnosis not present

## 2016-12-12 DIAGNOSIS — M1711 Unilateral primary osteoarthritis, right knee: Secondary | ICD-10-CM | POA: Diagnosis not present

## 2016-12-12 MED ORDER — MEMANTINE HCL 5 MG PO TABS: 5.0000 mg | ORAL_TABLET | Freq: Two times a day (BID) | ORAL | 3 refills | Status: DC

## 2016-12-12 NOTE — Progress Notes (Signed)
[facility] Provider:  Prisila Dlouhy L. Renato Gailseed, D.O., C.M.D.  Code Status: DNR Goals of Care:  Advanced Directives 10/14/2016  Does Patient Have a Medical Advance Directive? Yes  Type of Advance Directive Healthcare Power of Attorney  Does patient want to make changes to medical advance directive? -  Copy of Healthcare Power of Attorney in Chart? Yes     Chief Complaint  Patient presents with  . Medical Management of Chronic Issues    6week follow-up    HPI: Patient is a 82 y.o. female seen today for f/u on memory loss.  She was started on namenda 5mg  po daily the last visit on 11/27 and there are thoughts that if she is doing well, we can move to bid.  Tolerating it fine.  Right leg more painful at knee.  Standing on it was hard yesterday, but she still has not wanted it to go to orthopedics.    She says she cannot stop urinating if it wants to come.  Sometimes she'll stop it, but not always.  Also leaks with coughing and sneezing.  We checked her urine last time for infection and it was negative.  Her sister mentions that she has depends on hand for night and pads for the day.    Past Medical History:  Diagnosis Date  . Benign essential hypertension   . Confusion   . Diabetes mellitus without complication (HCC)   . Hyperlipidemia LDL goal < 100   . Hypothyroidism   . Macular degeneration   . Memory loss   . Senile osteoporosis     Past Surgical History:  Procedure Laterality Date  . ADENOIDECTOMY  2011    No Known Allergies  Allergies as of 12/12/2016   No Known Allergies     Medication List       Accurate as of 12/12/16  1:32 PM. Always use your most recent med list.          acetaminophen 500 MG tablet Commonly known as:  TYLENOL Take 1,000 mg by mouth 2 (two) times daily.   aspirin 81 MG chewable tablet Chew 81 mg by mouth daily.   calcium carbonate 1250 (500 Ca) MG tablet Commonly known as:  OS-CAL - dosed in mg of elemental calcium Take 1  tablet by mouth daily.   carbidopa-levodopa 25-100 MG tablet Commonly known as:  SINEMET IR Take 1 tablet by mouth 4 (four) times daily. Take at 8, 12, 4 PM, 8 PM.   Cholecalciferol 2000 units Caps Take 1 capsule (2,000 Units total) by mouth daily.   fluticasone 50 MCG/ACT nasal spray Commonly known as:  FLONASE Place 2 sprays into the nose as needed.   furosemide 20 MG tablet Commonly known as:  LASIX Take 1/2 tablet orally every day   glipiZIDE 5 MG tablet Commonly known as:  GLUCOTROL Take 5 mg by mouth daily before breakfast. For Diabetes (elevated blood sugar)   ICAPS MV Tabs Take 2 tablets by mouth daily.   levothyroxine 50 MCG tablet Commonly known as:  SYNTHROID, LEVOTHROID TAKE 1 TABLET BY MOUTH DAILY BEFORE BREAKFAST ON EMPTY STOMACH *BINGO CARD*   loperamide 2 MG capsule Commonly known as:  IMODIUM Take 2 mg by mouth. Take one tablet orally with each loose stool as need for diarrhea * NTE 8 doses in 24 hours*   magnesium hydroxide 400 MG/5ML suspension Commonly known as:  MILK OF MAGNESIA Take by mouth daily as needed for constipation.   memantine 5  MG tablet Commonly known as:  NAMENDA Take 1 tablet (5 mg total) by mouth daily.   metFORMIN 500 MG tablet Commonly known as:  GLUCOPHAGE Take 1 tablet (500 mg total) by mouth 2 (two) times daily with a meal.   MINTOX 200-200-20 MG/5ML suspension Generic drug:  alum & mag hydroxide-simeth Take 30 mLs by mouth every 6 (six) hours as needed for indigestion or heartburn.   mirtazapine 7.5 MG tablet Commonly known as:  REMERON Take 1 tablet (7.5 mg total) by mouth at bedtime.   potassium chloride 10 MEQ tablet Commonly known as:  K-DUR,KLOR-CON Take 10 mEq by mouth daily. Take to prevent potassium loss (due to taking fluid pill)   pravastatin 80 MG tablet Commonly known as:  PRAVACHOL Take 1 tablet (80 mg total) by mouth daily. For Cholesterol   ranitidine 150 MG capsule Commonly known as:  ZANTAC Take  150 mg by mouth at bedtime.   traMADol 50 MG tablet Commonly known as:  ULTRAM Take one tablet by mouth twice daily for pain; Take one tablet by mouth at bedtime for pain       Review of Systems:  Review of Systems  Constitutional: Negative for chills, fever and malaise/fatigue.  HENT: Positive for hearing loss.   Eyes: Negative for blurred vision.  Respiratory: Negative for shortness of breath.   Cardiovascular: Negative for chest pain and palpitations.  Gastrointestinal: Negative for abdominal pain, blood in stool, constipation and melena.  Genitourinary: Positive for frequency and urgency. Negative for dysuria, flank pain and hematuria.  Musculoskeletal: Positive for joint pain. Negative for falls and myalgias.  Neurological: Positive for tremors. Negative for dizziness, loss of consciousness and weakness.  Psychiatric/Behavioral: Positive for memory loss. Negative for depression. The patient does not have insomnia.     Health Maintenance  Topic Date Due  . URINE MICROALBUMIN  10/29/2015  . FOOT EXAM  10/01/2016  . ZOSTAVAX  11/17/2017 (Originally 11/22/1996)  . TETANUS/TDAP  11/17/2017 (Originally 02/16/2014)  . HEMOGLOBIN A1C  04/03/2017  . OPHTHALMOLOGY EXAM  04/16/2017  . INFLUENZA VACCINE  Completed  . DEXA SCAN  Completed  . PNA vac Low Risk Adult  Completed    Physical Exam: Vitals:   12/12/16 1318  BP: 128/70  Pulse: 65  Temp: 98.1 F (36.7 C)  TempSrc: Oral  SpO2: 98%  Weight: 146 lb (66.2 kg)   Body mass index is 25.86 kg/m. Physical Exam  Constitutional: She appears well-developed and well-nourished. No distress.  Cardiovascular: Normal rate, regular rhythm, normal heart sounds and intact distal pulses.   Pulmonary/Chest: Effort normal and breath sounds normal. No respiratory distress.  Abdominal: Bowel sounds are normal.  Musculoskeletal: Normal range of motion. She exhibits tenderness.  Right knee tender  Neurological: She is alert. No cranial nerve  deficit.  Oriented to person and place, repeats herself throughout the visit  Skin: Skin is warm and dry. Capillary refill takes less than 2 seconds.  Psychiatric:  Masked facies and flat affect    Labs reviewed: Basic Metabolic Panel:  Recent Labs  16/10/96 0855 05/24/16 1915 06/01/16 10/04/16 0850  NA 140 135 136* 139  K 4.2 4.2 4.5 4.6  CL 98 101  --  98  CO2 24 23  --  24  GLUCOSE 144* 232*  --  142*  BUN 11 19 15 12   CREATININE 0.71 0.51 0.7 0.69  CALCIUM 9.3 9.7  --  9.7  TSH  --   --  2.59  --  Liver Function Tests: No results for input(s): AST, ALT, ALKPHOS, BILITOT, PROT, ALBUMIN in the last 8760 hours. No results for input(s): LIPASE, AMYLASE in the last 8760 hours. No results for input(s): AMMONIA in the last 8760 hours. CBC:  Recent Labs  05/24/16 1915 06/01/16  WBC 5.9 8.5  NEUTROABS 5.4  --   HGB 15.0 14.3  HCT 43.9 42  MCV 90.9  --   PLT 236 278   Lipid Panel:  Recent Labs  01/31/16 0855 06/01/16  CHOL 177 154  HDL 73 73*  LDLCALC 89 69  TRIG 75 58  CHOLHDL 2.4  --    Lab Results  Component Value Date   HGBA1C 6.7 (H) 10/04/2016   Assessment/Plan 1. Mixed incontinence urge and stress - Ambulatory referral to Urology to determine if any is neurogenic or retention related to PD -might consider myrbetriq   2. Parkinson's disease (tremor, stiffness, slow motion, unstable posture) (HCC) -cont current therapy as per Dr. Frances Furbish  3. Memory loss - tolerating namenda 5mg  daily well for over 6 wks so will increase to bid now - memantine (NAMENDA) 5 MG tablet; Take 1 tablet (5 mg total) by mouth 2 (two) times daily.  Dispense: 60 tablet; Refill: 3  4. Primary osteoarthritis of right knee -finally agrees to go back to orthopedics for injection b/c it's bothering her enough  Labs/tests ordered:   Orders Placed This Encounter  Procedures  . Ambulatory referral to Urology    Referral Priority:   Routine    Referral Type:   Consultation     Referral Reason:   Specialty Services Required    Requested Specialty:   Urology    Number of Visits Requested:   1    Next appt:  03/14/2017   Katelin Kutsch L. Artemio Dobie, D.O. Geriatrics [facility] [address], [place] [address] Cell Phone (Mon-Fri 8am-5pm):  [phone] On Call:  [phone] & follow prompts after 5pm & weekends Office Phone:  [phone] Office Fax:  [phone]

## 2016-12-19 DIAGNOSIS — H353221 Exudative age-related macular degeneration, left eye, with active choroidal neovascularization: Secondary | ICD-10-CM | POA: Diagnosis not present

## 2016-12-19 LAB — HM DIABETES EYE EXAM

## 2016-12-20 DIAGNOSIS — M1711 Unilateral primary osteoarthritis, right knee: Secondary | ICD-10-CM | POA: Diagnosis not present

## 2016-12-20 DIAGNOSIS — M170 Bilateral primary osteoarthritis of knee: Secondary | ICD-10-CM | POA: Diagnosis not present

## 2016-12-23 ENCOUNTER — Telehealth: Payer: Self-pay | Admitting: *Deleted

## 2016-12-23 NOTE — Telephone Encounter (Signed)
[facility] #[phone] Fax: [phone] for FL2 Form and standing/Phyician Orders to be completed. Placed in Dr. Ernest Mallickeed's folder to review and sign.

## 2017-01-01 DIAGNOSIS — M6281 Muscle weakness (generalized): Secondary | ICD-10-CM | POA: Diagnosis not present

## 2017-01-01 DIAGNOSIS — R262 Difficulty in walking, not elsewhere classified: Secondary | ICD-10-CM | POA: Diagnosis not present

## 2017-01-01 DIAGNOSIS — R296 Repeated falls: Secondary | ICD-10-CM | POA: Diagnosis not present

## 2017-01-01 DIAGNOSIS — R278 Other lack of coordination: Secondary | ICD-10-CM | POA: Diagnosis not present

## 2017-01-02 DIAGNOSIS — R296 Repeated falls: Secondary | ICD-10-CM | POA: Diagnosis not present

## 2017-01-02 DIAGNOSIS — M6281 Muscle weakness (generalized): Secondary | ICD-10-CM | POA: Diagnosis not present

## 2017-01-02 DIAGNOSIS — R262 Difficulty in walking, not elsewhere classified: Secondary | ICD-10-CM | POA: Diagnosis not present

## 2017-01-02 DIAGNOSIS — R278 Other lack of coordination: Secondary | ICD-10-CM | POA: Diagnosis not present

## 2017-01-06 DIAGNOSIS — R296 Repeated falls: Secondary | ICD-10-CM | POA: Diagnosis not present

## 2017-01-06 DIAGNOSIS — M6281 Muscle weakness (generalized): Secondary | ICD-10-CM | POA: Diagnosis not present

## 2017-01-06 DIAGNOSIS — R278 Other lack of coordination: Secondary | ICD-10-CM | POA: Diagnosis not present

## 2017-01-06 DIAGNOSIS — R262 Difficulty in walking, not elsewhere classified: Secondary | ICD-10-CM | POA: Diagnosis not present

## 2017-01-10 DIAGNOSIS — R278 Other lack of coordination: Secondary | ICD-10-CM | POA: Diagnosis not present

## 2017-01-10 DIAGNOSIS — R296 Repeated falls: Secondary | ICD-10-CM | POA: Diagnosis not present

## 2017-01-10 DIAGNOSIS — R262 Difficulty in walking, not elsewhere classified: Secondary | ICD-10-CM | POA: Diagnosis not present

## 2017-01-10 DIAGNOSIS — M6281 Muscle weakness (generalized): Secondary | ICD-10-CM | POA: Diagnosis not present

## 2017-01-13 ENCOUNTER — Ambulatory Visit (INDEPENDENT_AMBULATORY_CARE_PROVIDER_SITE_OTHER): Payer: Medicare Other | Admitting: Neurology

## 2017-01-13 ENCOUNTER — Encounter: Payer: Self-pay | Admitting: Neurology

## 2017-01-13 VITALS — BP 173/89 | HR 72 | Resp 20 | Ht 63.0 in | Wt 138.0 lb

## 2017-01-13 DIAGNOSIS — R413 Other amnesia: Secondary | ICD-10-CM

## 2017-01-13 DIAGNOSIS — R296 Repeated falls: Secondary | ICD-10-CM | POA: Diagnosis not present

## 2017-01-13 DIAGNOSIS — G20 Parkinson's disease: Secondary | ICD-10-CM | POA: Diagnosis not present

## 2017-01-13 DIAGNOSIS — R220 Localized swelling, mass and lump, head: Secondary | ICD-10-CM

## 2017-01-13 DIAGNOSIS — M6281 Muscle weakness (generalized): Secondary | ICD-10-CM | POA: Diagnosis not present

## 2017-01-13 DIAGNOSIS — R262 Difficulty in walking, not elsewhere classified: Secondary | ICD-10-CM | POA: Diagnosis not present

## 2017-01-13 DIAGNOSIS — R278 Other lack of coordination: Secondary | ICD-10-CM | POA: Diagnosis not present

## 2017-01-13 NOTE — Patient Instructions (Addendum)
We will continue with your sinemet.  Continue your memory medication.  Continue PT. Use your walker at all times.  Please have your face checked by PCP or dermatologist for redness, mild swelling and warmth noted.

## 2017-01-13 NOTE — Progress Notes (Signed)
Subjective:    Patient ID: Kara Moore is a 82 y.o. female.  HPI     Interim history:  Kara Moore is a very pleasant 82 year old right-handed woman with an underlying medical history of diabetes, hyperlipidemia, hypothyroidism, vitamin D deficiency, reflux disease, and hypertension and osteoporosis who presents for followup consultation of her gait disorder and parkinsonism, probable L sided predominant PD, complicated by recurrent falls. She is accompanied by her niece, Kara Moore, again today. I last saw her on 09/12/2016, at which time she reported a fall in July. She had right knee injection earlier today and fell in the bathroom, unclear if she will off commode. She was taken to the emergency room, she had a head CT without contrast which was negative for any acute changes. She had age-related atrophy on head CT. She was on tramadol 50 mg twice daily scheduled.  Today, 01/13/2017: She reports doing okay, no recent fall, or maybe one per [place]. Memory worse, and she has been on namenda generic 5 mg bid since she saw Kara Moore on 12/12/16 and I reviewed the note. Started 5 mg qd in Nov 2017. Is referred to urology. Has lost some weight, about 10 lb in 6 months. Per niece, has PT, is in the process of getting a new knee brace for the right knee. Has seen orthopedics, not a candidate for knee replacement surgery.    The patient's allergies, current medications, family history, past medical history, past social history, past surgical history and problem list were reviewed and updated as appropriate.    Previously:    I saw her on 05/08/2016, at which time she reported right knee pain and leg cramps on the right side. She was not always hydrating well enough, was using the rolling walker. Thankfully she had no recent falls. She had injection into her left eye for her macular degeneration. She was no longer on trazodone was taking Remeron 7.5 mg at night, which was reduced by her primary care  physician.   I saw her on 12/20/2015, at which time she reported more eye problems. She was on tramadol 50 mg bid, remeron 15 mg at night, trazodone 50 mg, C/L 3 times a day, written for 8 AM, 2 PM and 8 PM. She has some knee pain. Thankfully, she had not fallen.    I saw her on 08/15/2015, at which time she was doing fairly well, her niece agreed. She had no recent falls. She was using her walker. Her diabetes numbers had improved and her memory was stable. She felt that her appetite was good but she does not always drink enough water. She was advised to continue with Sinemet 3 times a day and use her walker at all times. She was encouraged to drink more water. I ordered physical therapy and occupational therapy at [facility].   I saw her on 04/10/2015, at which time she reported right knee pain. She did not go back to orthopedics. She was seen by her PCP recently. She was still residing at [facility] assisted living. She had occasional left arm and hand tremor, especially worse when tired. She had no recent falls. Her latest A1c in March 2016 was 8.7. She had left eye surgery for removal of the chalazion. Kara Moore had no new concerns. Her memory was felt to be stable. Her MMSE was 29/30, CDT: 4/4, AFT: 12/min. I suggested she continue with Sinemet 1 pill 3 times a day.    I saw her on 12/08/2014, at  which time her niece reported several falls. She was drooling more. She had knee pain and swelling. She had gone to the emergency room. She had seen orthopedics and received injections into both knees which improved her pain significantly. They were considering Synvisc injections. She had had some therapy at [facility]. She was not drinking enough water. She was using a 2 wheeled walker more consistently. She went to [facility] and had x-rays of her knees and ultrasound of her legs which did not show any clots per Kara Moore. I suggested we increase her Sinemet to 1 pill 3 times a day. I  asked her to use her 2 wheeled walker consistently.    I saw her on 06/10/2014, at which time Kara Moore reported that they did not fill the order for the walker. The patient had a steroid injection into the right knee which helped marginally. She was reporting more knee pain on the left. She had an x-ray of her right knee. I asked her to use a rolling walker at all times for gait safety. We talked about her gait disorder and the complexity of her situation. I continued low-dose Sinemet.   I saw her on 01/18/2014, at which time we talked about her using her walker at all times. She was on Remeron for sleep. I prescribed a rolling walker with seat. She indicated that she would not want to use a walker.    I saw her on 09/09/2013, at which time I felt that her gait disorder with recurrent falls was out of proportion to her parkinsonism. I felt that her gait disorder was a function of advancing age, previous pelvic fracture, and arthritis. She had finished inpatient rehabilitation at the time. We talked about her recent additional testing including head CT, EEG, carotid Doppler study, cardiac monitor, all of which were unrevealing. She was advised to continue using her 2 wheeled walker at all times.   I saw her on 06/17/2013 after a recent fall and suggested further workup for a recent episode of confusion and her recurrent falls. I ordered CTH, EEG, C. Doppler, cardiac monitor and suggested a referral to geriatrics. She has since then started seeing Kara Moore.   Her head CT from 06/22/2013 was negative for any acute findings.   Her EEG from 06/25/2013 was normal in the awake and sleep states.   Her carotid Doppler study from 06/25/2013 was negative for any significant stenoses.   Her cardiac event monitor from 07/01/2013 through 07/30/2013 was consistent with normal sinus rhythm with occasional PVCs, no evidence of atrial fibrillation. This was interpreted by Dr. Dola Argyle.   She had an Echocardiogram on  04/15/13 at the [facility] in [place]. She has arthritis, in her R knee and had 2 injections before.   She lives at [facility] in [place]. She feels like "I have legs again". She is tolerating the C/L 25/100 mg 1/2 tid.   I first met her on 03/26/2013 at which time her niece and the patient reported a approximately two-month history of decrease in fine motor skills, decrease in mobility, gait changes and recurrent falls. She reported becoming slower than usual taking longer to dress herself, needing assistance with some of her ADLs. She was started on C/L at the time. She has had urinary urgency. She recently had physical therapy and her therapist noted lack of ability, inability to dress herself and drooling. The patient had to give up driving.   She had a MRI brain on  04/08/13: Abnormal MRI scan of the brain showing mild changes of age-appropriate chronic microvascular ischemia and generalized cerebral atrophy.   On 02/11/13 she fell out of her bed and had difficulty getting back up. Her nephew was visiting from [place], but he could not hear her, as he is hard of hearing and she was calling in a soft voice. She developed rhabdomyolysis. In the latter part of April she was taken to [facility] in [place], then to the ER for suspected cellulitis. She had an US of the LEs and did not have a blood clot. She was given lasix for swelling. The next day she fell, and then again, and then again the next day. After these falls, she started having care 24/7 through [facility]. She has been in PT since the end of April. She has been using a 2 wheeled walker since then.   She has a strong FHx of tremors on her mother's side (including M, MGM and cousins). Both of her siblings passed away.   She never had a TIA or a stroke.   She fell at home on 04/30/13, while coming out of the bathroom. She has been having issues with constipation and had taken Dulcolax suppositories, but also took Mag citrate and her niece found  her on the floor and she had soiled herself and for some reason had not pushed her Lifeline button. She denied hitting her head or LOC. Her niece called 4 and she was taken to [facility] to the ER and was discharged to home from there. Then she was admitted to inpt rehab at [facility] center on 05/02/13. She fell while in rehab and was found to have a pelvic Fx b/l, reportedly old. She has had some confusion intermittently.   Her Past Medical History Is Significant For: Past Medical History:  Diagnosis Date  . Benign essential hypertension   . Confusion   . Diabetes mellitus without complication ([place])   . Hyperlipidemia LDL goal < 100   . Hypothyroidism   . Macular degeneration   . Memory loss   . Senile osteoporosis     Her Past Surgical History Is Significant For: Past Surgical History:  Procedure Laterality Date  . ADENOIDECTOMY  2011    Her Family History Is Significant For: Family History  Problem Relation Age of Onset  . Heart attack Father   . Cancer Sister   . Heart attack Brother   . Diabetes Brother   . Tremor Maternal Grandmother   . Diabetes Paternal Grandfather   . Tremor Maternal Aunt   . Diabetes Paternal Grandmother     Her Social History Is Significant For: Social History   Social History  . Marital status: Single    Spouse name: N/A  . Number of children: N/A  . Years of education: N/A   Social History Main Topics  . Smoking status: Never Smoker  . Smokeless tobacco: Never Used  . Alcohol use No  . Drug use: No  . Sexual activity: No   Other Topics Concern  . None   Social History Narrative  . None    Her Allergies Are:  No Known Allergies:   Her Current Medications Are:  Outpatient Encounter Prescriptions as of 01/13/2017  Medication Sig  . acetaminophen (TYLENOL) 500 MG tablet Take 1,000 mg by mouth 2 (two) times daily.  Marland Kitchen alum & mag hydroxide-simeth (MINTOX) 270-786-75 MG/5ML suspension Take 30 mLs by mouth every  6 (six) hours as needed for indigestion  or heartburn.  Marland Kitchen aspirin 81 MG chewable tablet Chew 81 mg by mouth daily.   . calcium carbonate (OS-CAL - DOSED IN MG OF ELEMENTAL CALCIUM) 1250 (500 CA) MG tablet Take 1 tablet by mouth daily.   . carbidopa-levodopa (SINEMET IR) 25-100 MG tablet Take 1 tablet by mouth 4 (four) times daily. Take at 8, 12, 4 PM, 8 PM.  . Cholecalciferol 2000 UNITS CAPS Take 1 capsule (2,000 Units total) by mouth daily.  . fluticasone (FLONASE) 50 MCG/ACT nasal spray Place 2 sprays into the nose as needed.   . furosemide (LASIX) 20 MG tablet Take 1/2 tablet orally every day  . glipiZIDE (GLUCOTROL) 5 MG tablet Take 5 mg by mouth daily before breakfast. For Diabetes (elevated blood sugar)  . levothyroxine (SYNTHROID, LEVOTHROID) 50 MCG tablet TAKE 1 TABLET BY MOUTH DAILY BEFORE BREAKFAST ON EMPTY STOMACH *BINGO CARD*  . loperamide (IMODIUM) 2 MG capsule Take 2 mg by mouth. Take one tablet orally with each loose stool as need for diarrhea * NTE 8 doses in 24 hours*  . magnesium hydroxide (MILK OF MAGNESIA) 400 MG/5ML suspension Take by mouth daily as needed for constipation.  . memantine (NAMENDA) 5 MG tablet Take 1 tablet (5 mg total) by mouth 2 (two) times daily.  . metFORMIN (GLUCOPHAGE) 500 MG tablet Take 1 tablet (500 mg total) by mouth 2 (two) times daily with a meal.  . mirtazapine (REMERON) 7.5 MG tablet Take 1 tablet (7.5 mg total) by mouth at bedtime.  . Multiple Vitamins-Minerals (ICAPS MV) TABS Take 2 tablets by mouth daily.   . potassium chloride (K-DUR,KLOR-CON) 10 MEQ tablet Take 10 mEq by mouth daily. Take to prevent potassium loss (due to taking fluid pill)  . pravastatin (PRAVACHOL) 80 MG tablet Take 1 tablet (80 mg total) by mouth daily. For Cholesterol  . ranitidine (ZANTAC) 150 MG capsule Take 150 mg by mouth at bedtime.   . traMADol (ULTRAM) 50 MG tablet Take one tablet by mouth twice daily for pain; Take one tablet by mouth at bedtime for pain   No  facility-administered encounter medications on file as of 01/13/2017.   :  Review of Systems:  Out of a complete 14 point review of systems, all are reviewed and negative with the exception of these symptoms as listed below: Review of Systems  Neurological:       Pt presents today to discuss her PD. Pt is going to need a brace for her right knee. Pt is not being cooperative at the ALF. Pt is having PT at the ALF and the therapist is concerned about her gait.    Objective:  Neurologic Exam  Physical Exam Physical Examination:   Vitals:   01/13/17 1514  BP: (!) 173/89  Pulse: 72  Resp: 20   General Examination: The patient is a very pleasant 82 y.o. female in no acute distress. She appears mildly frail and deconditioned. She has lost weight. She is well groomed.  HEENT: Normocephalic, atraumatic, pupils are equal, round and reactive to light and accommodation. Extraocular tracking moderately impaired. She has a decreased eye blink rate. She has moderate facial masking, she has significant steroid changes on her face bilaterally, mild redness of the right cheek which seems stable but left cheek is warm, mildly swollen and more erythematous. She denies any itching or recent change in her face cream. Oropharynx exam reveals mild drooling, no significant airway crowding, tongue protrudes centrally and palate elevates symmetrically. Mallampati is class II. She has  no lip, neck or jaw tremor, neck is moderately rigid. Hearing is mildly impaired. Speech is moderately hypophonic.  Chest: Clear to auscultation without wheezing, rhonchi or crackles noted.  Heart: S1+S2+0, regular and normal without murmurs, rubs or gallops noted.   Abdomen: Soft, non-tender and non-distended with normal bowel sounds appreciated on auscultation.  Extremities: There is no pitting edema in the distal lower extremities bilaterally.   Skin: Warm and dry without trophic changes noted.  Musculoskeletal: exam reveals  no obvious joint deformities, tenderness or joint swelling or erythema, with the exception of right knee pain.   Neurologically:  Mental status: The patient is awake, alert and oriented in all 4 spheres. Her immediate and remote memory, attention, language skills and fund of knowledge are impaired.  There is mild slowness in thinking. Speech is moderately hypophonic. Mood appears normal, she does not appear to be anxious.  In May 2014: Her MMSE score was 29/30. CDT 4/4. AFT (Animal Fluency Test) score was 14.  On 12/08/2014: MMSE:27/30, CDT: 1/4, AFT: 18/min.   On 04/10/2015: MMSE: 29/30, CDT: 4/4, AFT: 12/min.   On 12/20/2015: MMSE: 29/30, CDT: 4/4, AFT: 13/min.   On 09/12/2016: MMSE: 23/30, 4/4, AFT: 12/min.    Cranial nerves II - XII are as described above under HEENT exam. In addition: shoulder shrug is normal with equal shoulder height noted. Motor exam: Normal bulk,  and strength for age, tone is mildly rigid with no significant cogwheeling noted. She has moderate bradykinesia. She has no resting tremor today. Romberg is not testable safely. Reflexes are 1+ in the upper extremities, trace in the lower extremities. Fine motor skills are moderately impaired on the left and slightly better on the right. She has no intention tremor on cerebellar testing.  Sensory exam is intact to light touch.  Gait, station and balance: she stands with moderate difficulty and pushes herself up, does not require help. She does not report pain with standing. She has a moderately stooped posture, mildly into the right, stable. She walks with her 2 wheeled walker, she has some trouble turning today. Her balance is impaired.  Assessment and Plan:  In summary, Kara Moore is a very pleasant 82 y.o.-year old female with an underlying medical history of arthritis, hyperlipidemia, hypothyroidism, vitamin D deficiency, reflux disease, diabetes, hypertension, osteoporosis, who presents for follow-up consultation of  her Parkinson's disease, left-sided predominant, complicated by recurrent falls, including falls with injury that she suffered a pelvic fracture in the past, and memory loss as well as urinary incontinence. She has had more trouble reading she also reports today. This could in part be secondary to her macular degeneration and also advancing Parkinson's disease. I suggested that she continue with Sinemet 1 pill 4 times a day, she has been on Namenda generic 5 mg twice daily since January and was previously started on 5 mg once daily about 3 months ago. She has been able to tolerate this and I suggested she continue with this. For her gait disorder she is advised to continue with physical therapy and she may benefit from the knee brace which is an the making. In addition, she is advised to monitor her weight and her appetite, blood pressure is also little bit higher than what is typical for her. They are advised to monitor. Furthermore, she is noted to have left facial swelling and redness as well as warmth, I have asked him to have that looked at if it continues for the next day or so. She  denies any pain with it or itching. I suggested a six-month follow-up routinely, sooner as needed. I answered all the questions today and the patient and her niece were in agreement. I spent 25 minutes in total face-to-face time with the patient, more than 50% of which was spent in counseling and coordination of care, reviewing test results, reviewing medication and discussing or reviewing the diagnosis of PD, its prognosis and treatment options. Pertinent laboratory and imaging test results that were available during this visit with the patient were reviewed by me and considered in my medical decision making (see chart for details).

## 2017-01-16 DIAGNOSIS — R262 Difficulty in walking, not elsewhere classified: Secondary | ICD-10-CM | POA: Diagnosis not present

## 2017-01-16 DIAGNOSIS — R296 Repeated falls: Secondary | ICD-10-CM | POA: Diagnosis not present

## 2017-01-16 DIAGNOSIS — M6281 Muscle weakness (generalized): Secondary | ICD-10-CM | POA: Diagnosis not present

## 2017-01-16 DIAGNOSIS — R278 Other lack of coordination: Secondary | ICD-10-CM | POA: Diagnosis not present

## 2017-01-17 DIAGNOSIS — R296 Repeated falls: Secondary | ICD-10-CM | POA: Diagnosis not present

## 2017-01-17 DIAGNOSIS — M6281 Muscle weakness (generalized): Secondary | ICD-10-CM | POA: Diagnosis not present

## 2017-01-17 DIAGNOSIS — R278 Other lack of coordination: Secondary | ICD-10-CM | POA: Diagnosis not present

## 2017-01-17 DIAGNOSIS — R262 Difficulty in walking, not elsewhere classified: Secondary | ICD-10-CM | POA: Diagnosis not present

## 2017-01-20 DIAGNOSIS — R296 Repeated falls: Secondary | ICD-10-CM | POA: Diagnosis not present

## 2017-01-20 DIAGNOSIS — R262 Difficulty in walking, not elsewhere classified: Secondary | ICD-10-CM | POA: Diagnosis not present

## 2017-01-20 DIAGNOSIS — R278 Other lack of coordination: Secondary | ICD-10-CM | POA: Diagnosis not present

## 2017-01-20 DIAGNOSIS — M6281 Muscle weakness (generalized): Secondary | ICD-10-CM | POA: Diagnosis not present

## 2017-01-21 DIAGNOSIS — R278 Other lack of coordination: Secondary | ICD-10-CM | POA: Diagnosis not present

## 2017-01-21 DIAGNOSIS — M6281 Muscle weakness (generalized): Secondary | ICD-10-CM | POA: Diagnosis not present

## 2017-01-21 DIAGNOSIS — R262 Difficulty in walking, not elsewhere classified: Secondary | ICD-10-CM | POA: Diagnosis not present

## 2017-01-21 DIAGNOSIS — R296 Repeated falls: Secondary | ICD-10-CM | POA: Diagnosis not present

## 2017-01-22 DIAGNOSIS — R278 Other lack of coordination: Secondary | ICD-10-CM | POA: Diagnosis not present

## 2017-01-22 DIAGNOSIS — M6281 Muscle weakness (generalized): Secondary | ICD-10-CM | POA: Diagnosis not present

## 2017-01-22 DIAGNOSIS — R262 Difficulty in walking, not elsewhere classified: Secondary | ICD-10-CM | POA: Diagnosis not present

## 2017-01-22 DIAGNOSIS — R296 Repeated falls: Secondary | ICD-10-CM | POA: Diagnosis not present

## 2017-01-23 DIAGNOSIS — R262 Difficulty in walking, not elsewhere classified: Secondary | ICD-10-CM | POA: Diagnosis not present

## 2017-01-23 DIAGNOSIS — R278 Other lack of coordination: Secondary | ICD-10-CM | POA: Diagnosis not present

## 2017-01-23 DIAGNOSIS — R296 Repeated falls: Secondary | ICD-10-CM | POA: Diagnosis not present

## 2017-01-23 DIAGNOSIS — M6281 Muscle weakness (generalized): Secondary | ICD-10-CM | POA: Diagnosis not present

## 2017-01-27 DIAGNOSIS — M6281 Muscle weakness (generalized): Secondary | ICD-10-CM | POA: Diagnosis not present

## 2017-01-27 DIAGNOSIS — R262 Difficulty in walking, not elsewhere classified: Secondary | ICD-10-CM | POA: Diagnosis not present

## 2017-01-27 DIAGNOSIS — R296 Repeated falls: Secondary | ICD-10-CM | POA: Diagnosis not present

## 2017-01-27 DIAGNOSIS — R278 Other lack of coordination: Secondary | ICD-10-CM | POA: Diagnosis not present

## 2017-01-30 DIAGNOSIS — R262 Difficulty in walking, not elsewhere classified: Secondary | ICD-10-CM | POA: Diagnosis not present

## 2017-01-30 DIAGNOSIS — R278 Other lack of coordination: Secondary | ICD-10-CM | POA: Diagnosis not present

## 2017-01-30 DIAGNOSIS — H353221 Exudative age-related macular degeneration, left eye, with active choroidal neovascularization: Secondary | ICD-10-CM | POA: Diagnosis not present

## 2017-01-30 DIAGNOSIS — R296 Repeated falls: Secondary | ICD-10-CM | POA: Diagnosis not present

## 2017-01-30 DIAGNOSIS — M6281 Muscle weakness (generalized): Secondary | ICD-10-CM | POA: Diagnosis not present

## 2017-02-03 DIAGNOSIS — R278 Other lack of coordination: Secondary | ICD-10-CM | POA: Diagnosis not present

## 2017-02-03 DIAGNOSIS — R296 Repeated falls: Secondary | ICD-10-CM | POA: Diagnosis not present

## 2017-02-03 DIAGNOSIS — M6281 Muscle weakness (generalized): Secondary | ICD-10-CM | POA: Diagnosis not present

## 2017-02-03 DIAGNOSIS — R262 Difficulty in walking, not elsewhere classified: Secondary | ICD-10-CM | POA: Diagnosis not present

## 2017-02-04 DIAGNOSIS — R278 Other lack of coordination: Secondary | ICD-10-CM | POA: Diagnosis not present

## 2017-02-04 DIAGNOSIS — R296 Repeated falls: Secondary | ICD-10-CM | POA: Diagnosis not present

## 2017-02-04 DIAGNOSIS — R262 Difficulty in walking, not elsewhere classified: Secondary | ICD-10-CM | POA: Diagnosis not present

## 2017-02-04 DIAGNOSIS — M6281 Muscle weakness (generalized): Secondary | ICD-10-CM | POA: Diagnosis not present

## 2017-02-05 DIAGNOSIS — M6281 Muscle weakness (generalized): Secondary | ICD-10-CM | POA: Diagnosis not present

## 2017-02-05 DIAGNOSIS — R278 Other lack of coordination: Secondary | ICD-10-CM | POA: Diagnosis not present

## 2017-02-05 DIAGNOSIS — R296 Repeated falls: Secondary | ICD-10-CM | POA: Diagnosis not present

## 2017-02-05 DIAGNOSIS — R262 Difficulty in walking, not elsewhere classified: Secondary | ICD-10-CM | POA: Diagnosis not present

## 2017-02-06 DIAGNOSIS — R351 Nocturia: Secondary | ICD-10-CM | POA: Diagnosis not present

## 2017-02-06 DIAGNOSIS — M6281 Muscle weakness (generalized): Secondary | ICD-10-CM | POA: Diagnosis not present

## 2017-02-06 DIAGNOSIS — N3946 Mixed incontinence: Secondary | ICD-10-CM | POA: Diagnosis not present

## 2017-02-06 DIAGNOSIS — R278 Other lack of coordination: Secondary | ICD-10-CM | POA: Diagnosis not present

## 2017-02-06 DIAGNOSIS — R262 Difficulty in walking, not elsewhere classified: Secondary | ICD-10-CM | POA: Diagnosis not present

## 2017-02-06 DIAGNOSIS — R296 Repeated falls: Secondary | ICD-10-CM | POA: Diagnosis not present

## 2017-02-06 DIAGNOSIS — R350 Frequency of micturition: Secondary | ICD-10-CM | POA: Diagnosis not present

## 2017-02-10 DIAGNOSIS — R262 Difficulty in walking, not elsewhere classified: Secondary | ICD-10-CM | POA: Diagnosis not present

## 2017-02-10 DIAGNOSIS — M6281 Muscle weakness (generalized): Secondary | ICD-10-CM | POA: Diagnosis not present

## 2017-02-10 DIAGNOSIS — R296 Repeated falls: Secondary | ICD-10-CM | POA: Diagnosis not present

## 2017-02-10 DIAGNOSIS — R278 Other lack of coordination: Secondary | ICD-10-CM | POA: Diagnosis not present

## 2017-02-11 DIAGNOSIS — M6281 Muscle weakness (generalized): Secondary | ICD-10-CM | POA: Diagnosis not present

## 2017-02-11 DIAGNOSIS — R296 Repeated falls: Secondary | ICD-10-CM | POA: Diagnosis not present

## 2017-02-11 DIAGNOSIS — R278 Other lack of coordination: Secondary | ICD-10-CM | POA: Diagnosis not present

## 2017-02-11 DIAGNOSIS — R262 Difficulty in walking, not elsewhere classified: Secondary | ICD-10-CM | POA: Diagnosis not present

## 2017-02-12 DIAGNOSIS — R278 Other lack of coordination: Secondary | ICD-10-CM | POA: Diagnosis not present

## 2017-02-12 DIAGNOSIS — M6281 Muscle weakness (generalized): Secondary | ICD-10-CM | POA: Diagnosis not present

## 2017-02-12 DIAGNOSIS — R296 Repeated falls: Secondary | ICD-10-CM | POA: Diagnosis not present

## 2017-02-12 DIAGNOSIS — R262 Difficulty in walking, not elsewhere classified: Secondary | ICD-10-CM | POA: Diagnosis not present

## 2017-02-13 DIAGNOSIS — R262 Difficulty in walking, not elsewhere classified: Secondary | ICD-10-CM | POA: Diagnosis not present

## 2017-02-13 DIAGNOSIS — R278 Other lack of coordination: Secondary | ICD-10-CM | POA: Diagnosis not present

## 2017-02-13 DIAGNOSIS — R296 Repeated falls: Secondary | ICD-10-CM | POA: Diagnosis not present

## 2017-02-13 DIAGNOSIS — M6281 Muscle weakness (generalized): Secondary | ICD-10-CM | POA: Diagnosis not present

## 2017-02-18 DIAGNOSIS — R262 Difficulty in walking, not elsewhere classified: Secondary | ICD-10-CM | POA: Diagnosis not present

## 2017-02-18 DIAGNOSIS — R296 Repeated falls: Secondary | ICD-10-CM | POA: Diagnosis not present

## 2017-02-21 DIAGNOSIS — R262 Difficulty in walking, not elsewhere classified: Secondary | ICD-10-CM | POA: Diagnosis not present

## 2017-02-21 DIAGNOSIS — R296 Repeated falls: Secondary | ICD-10-CM | POA: Diagnosis not present

## 2017-02-24 DIAGNOSIS — R262 Difficulty in walking, not elsewhere classified: Secondary | ICD-10-CM | POA: Diagnosis not present

## 2017-02-24 DIAGNOSIS — R296 Repeated falls: Secondary | ICD-10-CM | POA: Diagnosis not present

## 2017-02-26 DIAGNOSIS — R262 Difficulty in walking, not elsewhere classified: Secondary | ICD-10-CM | POA: Diagnosis not present

## 2017-02-26 DIAGNOSIS — R296 Repeated falls: Secondary | ICD-10-CM | POA: Diagnosis not present

## 2017-03-06 ENCOUNTER — Encounter: Payer: Self-pay | Admitting: Internal Medicine

## 2017-03-14 ENCOUNTER — Encounter: Payer: Self-pay | Admitting: Internal Medicine

## 2017-03-14 ENCOUNTER — Ambulatory Visit (INDEPENDENT_AMBULATORY_CARE_PROVIDER_SITE_OTHER): Payer: Medicare Other | Admitting: Internal Medicine

## 2017-03-14 VITALS — BP 140/80 | HR 67 | Temp 97.6°F | Wt 142.0 lb

## 2017-03-14 DIAGNOSIS — G20 Parkinson's disease: Secondary | ICD-10-CM | POA: Diagnosis not present

## 2017-03-14 DIAGNOSIS — E039 Hypothyroidism, unspecified: Secondary | ICD-10-CM | POA: Diagnosis not present

## 2017-03-14 DIAGNOSIS — M1711 Unilateral primary osteoarthritis, right knee: Secondary | ICD-10-CM | POA: Diagnosis not present

## 2017-03-14 DIAGNOSIS — N3946 Mixed incontinence: Secondary | ICD-10-CM | POA: Diagnosis not present

## 2017-03-14 DIAGNOSIS — E119 Type 2 diabetes mellitus without complications: Secondary | ICD-10-CM

## 2017-03-14 DIAGNOSIS — I10 Essential (primary) hypertension: Secondary | ICD-10-CM

## 2017-03-14 DIAGNOSIS — G20A1 Parkinson's disease without dyskinesia, without mention of fluctuations: Secondary | ICD-10-CM

## 2017-03-14 LAB — CBC WITH DIFFERENTIAL/PLATELET
Basophils Absolute: 0 cells/uL (ref 0–200)
Basophils Relative: 0 %
Eosinophils Absolute: 102 cells/uL (ref 15–500)
Eosinophils Relative: 2 %
HCT: 41 % (ref 35.0–45.0)
Hemoglobin: 13.5 g/dL (ref 11.7–15.5)
Lymphocytes Relative: 27 %
Lymphs Abs: 1377 cells/uL (ref 850–3900)
MCH: 30.5 pg (ref 27.0–33.0)
MCHC: 32.9 g/dL (ref 32.0–36.0)
MCV: 92.8 fL (ref 80.0–100.0)
MPV: 10.2 fL (ref 7.5–12.5)
Monocytes Absolute: 357 cells/uL (ref 200–950)
Monocytes Relative: 7 %
Neutro Abs: 3264 cells/uL (ref 1500–7800)
Neutrophils Relative %: 64 %
Platelets: 258 10*3/uL (ref 140–400)
RBC: 4.42 MIL/uL (ref 3.80–5.10)
RDW: 14.2 % (ref 11.0–15.0)
WBC: 5.1 10*3/uL (ref 3.8–10.8)

## 2017-03-14 LAB — COMPLETE METABOLIC PANEL WITH GFR
AG Ratio: 1.5 Ratio (ref 1.0–2.5)
ALT: 8 U/L (ref 6–29)
AST: 10 U/L (ref 10–35)
Albumin: 4 g/dL (ref 3.6–5.1)
Alkaline Phosphatase: 60 U/L (ref 33–130)
BUN/Creatinine Ratio: 23.5 Ratio — ABNORMAL HIGH (ref 6–22)
BUN: 16 mg/dL (ref 7–25)
CO2: 29 mmol/L (ref 20–31)
Calcium: 9.3 mg/dL (ref 8.6–10.4)
Chloride: 102 mmol/L (ref 98–110)
Creat: 0.68 mg/dL (ref 0.60–0.88)
GFR, Est African American: >89 mL/min (ref >=60)
GFR, Est Non African American: 83 mL/min (ref >=60)
Globulin: 2.6 g/dL (ref 1.9–3.7)
Glucose, Bld: 131 mg/dL — ABNORMAL HIGH (ref 65–99)
Potassium: 4.3 mmol/L (ref 3.5–5.3)
Sodium: 138 mmol/L (ref 135–146)
Total Bilirubin: 0.5 mg/dL (ref 0.2–1.2)
Total Protein: 6.6 g/dL (ref 6.1–8.1)

## 2017-03-14 LAB — TSH: TSH: 3.88 mIU/L

## 2017-03-14 NOTE — Progress Notes (Signed)
Location:  [facility] clinic Provider:  Luisalberto Beegle L. Renato Gails, D.O., C.M.D.  Code Status: DNR Goals of Care:  Advanced Directives 03/14/2017  Does Patient Have a Medical Advance Directive? No  Type of Advance Directive -  Does patient want to make changes to medical advance directive? -  Copy of Healthcare Power of Attorney in Chart? -  Would patient like information on creating a medical advance directive? No - Patient declined  Note that advance directives and DNR are not on file for some reason.  We must have discussed this before EPIC.   Chief Complaint  Patient presents with  . Medical Management of Chronic Issues    follow-up, right knee pain    HPI: Patient is a 82 y.o. female seen today for medical management of chronic diseases.    Up 4 lbs here from 01/13/17 to 142 lbs.  There were concerns that she was losing because her intake has been less and she does not always like the food at [facility]. She reports that she will exchange food items with others at the table until they have what they want but about once a week, nothing is appealing.  She primarily c/o her right knee today.  When she saw ortho, it was bone on bone and a brace was measured for and provided, tried on.  Now Deriona will not wear it, saying that it hurts her knee more to wear it.  [name] has been trying to get her to go back so that they can either convince her to wear it or come up with something else, but Lasheena has been refusing outright to go. Walks with walker and says she cannot even sit or bend her knee if she is using the brace even though it has a hinge on it.  I'm sure she could not figure out how to put it on by herself.  No difficulties with hypoglycemia.  Taking her metformin and glipizide  Mood poor today and [name] is clearly frustrated with her.  Short term memory is progressively worsening despite namenda and she has a bad attitude today and something negative to say about everything.  BP a bit up,  but she's agitated.   Past Medical History:  Diagnosis Date  . Benign essential hypertension   . Confusion   . Diabetes mellitus without complication (HCC)   . Hyperlipidemia LDL goal < 100   . Hypothyroidism   . Macular degeneration   . Memory loss   . Senile osteoporosis     Past Surgical History:  Procedure Laterality Date  . ADENOIDECTOMY  2011    No Known Allergies  Allergies as of 03/14/2017   No Known Allergies     Medication List       Accurate as of 03/14/17 10:34 AM. Always use your most recent med list.          acetaminophen 500 MG tablet Commonly known as:  TYLENOL Take 1,000 mg by mouth 2 (two) times daily.   aspirin 81 MG chewable tablet Chew 81 mg by mouth daily.   calcium carbonate 1250 (500 Ca) MG tablet Commonly known as:  OS-CAL - dosed in mg of elemental calcium Take 1 tablet by mouth daily.   carbidopa-levodopa 25-100 MG tablet Commonly known as:  SINEMET IR Take 1 tablet by mouth 4 (four) times daily. Take at 8, 12, 4 PM, 8 PM.   Cholecalciferol 2000 units Caps Take 1 capsule (2,000 Units total) by mouth daily.  fluticasone 50 MCG/ACT nasal spray Commonly known as:  FLONASE Place 2 sprays into the nose as needed.   furosemide 20 MG tablet Commonly known as:  LASIX Take 1/2 tablet orally every day   glipiZIDE 5 MG tablet Commonly known as:  GLUCOTROL Take 5 mg by mouth daily before breakfast. For Diabetes (elevated blood sugar)   ICAPS MV Tabs Take 2 tablets by mouth daily.   levothyroxine 50 MCG tablet Commonly known as:  SYNTHROID, LEVOTHROID TAKE 1 TABLET BY MOUTH DAILY BEFORE BREAKFAST ON EMPTY STOMACH *BINGO CARD*   loperamide 2 MG capsule Commonly known as:  IMODIUM Take 2 mg by mouth. Take one tablet orally with each loose stool as need for diarrhea * NTE 8 doses in 24 hours*   magnesium hydroxide 400 MG/5ML suspension Commonly known as:  MILK OF MAGNESIA Take by mouth daily as needed for constipation.     memantine 5 MG tablet Commonly known as:  NAMENDA Take 1 tablet (5 mg total) by mouth 2 (two) times daily.   metFORMIN 500 MG tablet Commonly known as:  GLUCOPHAGE Take 1 tablet (500 mg total) by mouth 2 (two) times daily with a meal.   MINTOX 200-200-20 MG/5ML suspension Generic drug:  alum & mag hydroxide-simeth Take 30 mLs by mouth every 6 (six) hours as needed for indigestion or heartburn.   mirtazapine 7.5 MG tablet Commonly known as:  REMERON Take 1 tablet (7.5 mg total) by mouth at bedtime.   MYRBETRIQ 50 MG Tb24 tablet Generic drug:  mirabegron ER Take 50 mg by mouth daily.   potassium chloride 10 MEQ tablet Commonly known as:  K-DUR,KLOR-CON Take 10 mEq by mouth daily. Take to prevent potassium loss (due to taking fluid pill)   pravastatin 80 MG tablet Commonly known as:  PRAVACHOL Take 1 tablet (80 mg total) by mouth daily. For Cholesterol   ranitidine 150 MG capsule Commonly known as:  ZANTAC Take 150 mg by mouth at bedtime.   traMADol 50 MG tablet Commonly known as:  ULTRAM Take one tablet by mouth twice daily for pain; Take one tablet by mouth at bedtime for pain       Review of Systems:  Review of Systems  Constitutional: Negative for chills, fever and malaise/fatigue.  HENT: Negative for congestion.   Eyes: Negative for blurred vision.  Respiratory: Negative for shortness of breath.   Cardiovascular: Negative for chest pain, palpitations and leg swelling.  Gastrointestinal: Negative for abdominal pain, blood in stool, constipation and melena.  Genitourinary: Positive for frequency. Negative for dysuria, flank pain, hematuria and urgency.       Stress inc persists  Musculoskeletal: Positive for joint pain. Negative for falls.       Left knee  Skin: Negative for itching and rash.  Neurological: Positive for tremors. Negative for dizziness, loss of consciousness, weakness and headaches.  Endo/Heme/Allergies: Bruises/bleeds easily.   Psychiatric/Behavioral: Positive for memory loss.    Health Maintenance  Topic Date Due  . URINE MICROALBUMIN  10/29/2015  . FOOT EXAM  10/01/2016  . TETANUS/TDAP  11/17/2017 (Originally 02/16/2014)  . HEMOGLOBIN A1C  04/03/2017  . INFLUENZA VACCINE  06/18/2017  . OPHTHALMOLOGY EXAM  12/19/2017  . DEXA SCAN  Completed  . PNA vac Low Risk Adult  Completed    Physical Exam: Vitals:   03/14/17 1026  BP: 140/80  Pulse: 67  Temp: 97.6 F (36.4 C)  TempSrc: Oral  SpO2: 96%  Weight: 142 lb (64.4 kg)   Body mass  index is 25.15 kg/m. Physical Exam  Constitutional: She appears well-developed and well-nourished. No distress.  Cardiovascular: Normal rate, regular rhythm, normal heart sounds and intact distal pulses.   Pulmonary/Chest: Effort normal and breath sounds normal. No respiratory distress.  Musculoskeletal: Normal range of motion.  Walks with stooped posture with rolling walker with skis; limps on right leg  Neurological: She is alert.  Oriented to person and place, not time, poor historian and getting frustrated easily today, repeating the same questions throughout visit; masked facies and hypophonia  Skin: Skin is warm and dry. Capillary refill takes less than 2 seconds.  Psychiatric: She has a normal mood and affect.    Labs reviewed: Basic Metabolic Panel:  Recent Labs  16/10/96 1915 06/01/16 10/04/16 0850  NA 135 136* 139  K 4.2 4.5 4.6  CL 101  --  98  CO2 23  --  24  GLUCOSE 232*  --  142*  BUN CREATININE 0.51 0.7 0.69  CALCIUM 9.7  --  9.7  TSH  --  2.59  --    Liver Function Tests: No results for input(s): AST, ALT, ALKPHOS, BILITOT, PROT, ALBUMIN in the last 8760 hours. No results for input(s): LIPASE, AMYLASE in the last 8760 hours. No results for input(s): AMMONIA in the last 8760 hours. CBC:  Recent Labs  05/24/16 1915 06/01/16  WBC 5.9 8.5  NEUTROABS 5.4  --   HGB 15.0 14.3  HCT 43.9 42  MCV 90.9  --   PLT 236 278   Lipid  Panel:  Recent Labs  06/01/16  CHOL 154  HDL 73*  LDLCALC 69  TRIG 58   Lab Results  Component Value Date   HGBA1C 6.7 (H) 10/04/2016    Procedures since last visit: No results found.  Assessment/Plan 1. Type 2 diabetes mellitus without complication, without long-term current use of insulin (HCC) -has been well-controlled with metformin, pravachol, asa, glipizide, no lows noted, keep regimen the same and f/u labs - Hemoglobin A1c - CBC with Differential/Platelet - COMPLETE METABOLIC PANEL WITH GFR  2. Hypothyroidism, unspecified type -taking her levothyroxine faithfully each am, f/u lab - TSH  3. Parkinson's disease (tremor, stiffness, slow motion, unstable posture) (HCC) -gradually progressive, having more night cramps in her legs which I suspect are related to her PD -cont sinemet IR qid, ?would benefit from CR at hs like some folks I've had  4. Mixed incontinence urge and stress -improved urge/OAB component, stress aspect persists so must use specialty undergarments  5. Primary osteoarthritis of right knee -We discussed gradually working up on the time she's wearing It to prevent her knee from turning inward more than it already has.  Orders were written for staff to help her put it on in the am and off in the pm. -also continues on tramadol  po bid prn and tylenol  po bid scheduled  6. Essential hypertension, benign -bp at upper limits of normal today, but she was in a terrible mood, agitated with [name] and upset about her knee situation  Labs/tests ordered:   Orders Placed This Encounter  Procedures  . Hemoglobin A1c  . CBC with Differential/Platelet  . COMPLETE METABOLIC PANEL WITH GFR  . TSH    Next appt:  07/14/2017 med mgt  Verlena Marlette L. Malique Driskill, D.O. Geriatrics [facility] [address], [place] [address] Cell Phone (Mon-Fri 8am-5pm):  [phone] On Call:  [phone] & follow prompts after 5pm &  weekends Office Phone:  [phone] Office Fax:  [phone]  +

## 2017-03-15 LAB — HEMOGLOBIN A1C
Hgb A1c MFr Bld: 6.5 % — ABNORMAL HIGH (ref <5.7)
Mean Plasma Glucose: 140 mg/dL

## 2017-03-17 ENCOUNTER — Encounter: Payer: Self-pay | Admitting: *Deleted

## 2017-03-17 DIAGNOSIS — H353221 Exudative age-related macular degeneration, left eye, with active choroidal neovascularization: Secondary | ICD-10-CM | POA: Diagnosis not present

## 2017-03-20 DIAGNOSIS — R351 Nocturia: Secondary | ICD-10-CM | POA: Diagnosis not present

## 2017-03-20 DIAGNOSIS — N3946 Mixed incontinence: Secondary | ICD-10-CM | POA: Diagnosis not present

## 2017-03-28 ENCOUNTER — Emergency Department
Admission: EM | Admit: 2017-03-28 | Discharge: 2017-03-28 | Disposition: A | Discharge status: Home / Self Care | Payer: Medicare Other | Attending: Emergency Medicine | Admitting: Emergency Medicine

## 2017-03-28 ENCOUNTER — Emergency Department: Payer: Medicare Other

## 2017-03-28 DIAGNOSIS — Y929 Unspecified place or not applicable: Secondary | ICD-10-CM | POA: Insufficient documentation

## 2017-03-28 DIAGNOSIS — W08XXXA Fall from other furniture, initial encounter: Secondary | ICD-10-CM | POA: Insufficient documentation

## 2017-03-28 DIAGNOSIS — Y939 Activity, unspecified: Secondary | ICD-10-CM | POA: Diagnosis not present

## 2017-03-28 DIAGNOSIS — Z008 Encounter for other general examination: Secondary | ICD-10-CM | POA: Diagnosis not present

## 2017-03-28 DIAGNOSIS — Z79899 Other long term (current) drug therapy: Secondary | ICD-10-CM | POA: Diagnosis not present

## 2017-03-28 DIAGNOSIS — M25551 Pain in right hip: Secondary | ICD-10-CM | POA: Diagnosis not present

## 2017-03-28 DIAGNOSIS — W19XXXA Unspecified fall, initial encounter: Secondary | ICD-10-CM

## 2017-03-28 DIAGNOSIS — G20 Parkinson's disease: Secondary | ICD-10-CM | POA: Diagnosis not present

## 2017-03-28 DIAGNOSIS — E119 Type 2 diabetes mellitus without complications: Secondary | ICD-10-CM | POA: Diagnosis not present

## 2017-03-28 DIAGNOSIS — Y999 Unspecified external cause status: Secondary | ICD-10-CM | POA: Insufficient documentation

## 2017-03-28 DIAGNOSIS — I10 Essential (primary) hypertension: Secondary | ICD-10-CM | POA: Diagnosis not present

## 2017-03-28 DIAGNOSIS — Z7982 Long term (current) use of aspirin: Secondary | ICD-10-CM | POA: Insufficient documentation

## 2017-03-28 DIAGNOSIS — E039 Hypothyroidism, unspecified: Secondary | ICD-10-CM | POA: Diagnosis not present

## 2017-03-28 DIAGNOSIS — Z7984 Long term (current) use of oral hypoglycemic drugs: Secondary | ICD-10-CM | POA: Insufficient documentation

## 2017-03-28 DIAGNOSIS — S79911A Unspecified injury of right hip, initial encounter: Secondary | ICD-10-CM | POA: Diagnosis not present

## 2017-03-28 LAB — CBC
HCT: 39.2 % (ref 35.0–47.0)
HEMOGLOBIN: 13.5 g/dL (ref 12.0–16.0)
MCH: 31.5 pg (ref 26.0–34.0)
MCHC: 34.3 g/dL (ref 32.0–36.0)
MCV: 91.9 fL (ref 80.0–100.0)
Platelets: 221 10*3/uL (ref 150–440)
RBC: 4.27 MIL/uL (ref 3.80–5.20)
RDW: 14.3 % (ref 11.5–14.5)
WBC: 9.8 10*3/uL (ref 3.6–11.0)

## 2017-03-28 LAB — URINALYSIS, COMPLETE (UACMP) WITH MICROSCOPIC
BILIRUBIN URINE: NEGATIVE
Bacteria, UA: NONE SEEN
Glucose, UA: NEGATIVE mg/dL
HGB URINE DIPSTICK: NEGATIVE
KETONES UR: 5 mg/dL — AB
LEUKOCYTES UA: NEGATIVE
NITRITE: NEGATIVE
PROTEIN: NEGATIVE mg/dL
Specific Gravity, Urine: 1.021 (ref 1.005–1.030)
pH: 5 (ref 5.0–8.0)

## 2017-03-28 LAB — BASIC METABOLIC PANEL
ANION GAP: 7 (ref 5–15)
BUN: 18 mg/dL (ref 6–20)
CHLORIDE: 103 mmol/L (ref 101–111)
CO2: 27 mmol/L (ref 22–32)
Calcium: 9.3 mg/dL (ref 8.9–10.3)
Creatinine, Ser: 0.49 mg/dL (ref 0.44–1.00)
GFR calc Af Amer: >60 mL/min (ref >60)
GFR calc non Af Amer: >60 mL/min (ref >60)
Glucose, Bld: 149 mg/dL — ABNORMAL HIGH (ref 65–99)
POTASSIUM: 4.2 mmol/L (ref 3.5–5.1)
SODIUM: 137 mmol/L (ref 135–145)

## 2017-03-28 LAB — CK: Total CK: 45 U/L (ref 38–234)

## 2017-03-28 MED ORDER — CARBIDOPA-LEVODOPA 25-100 MG PO TABS
1.0000 | ORAL_TABLET | ORAL | Status: AC
  Administered 2017-03-28: 1 via ORAL
  Filled 2017-03-28: qty 1

## 2017-03-28 MED ORDER — ACETAMINOPHEN 325 MG PO TABS
650.0000 mg | ORAL_TABLET | ORAL | Status: AC
  Administered 2017-03-28: 650 mg via ORAL
  Filled 2017-03-28: qty 2

## 2017-03-28 NOTE — ED Provider Notes (Signed)
[facility] Emergency Department Provider Note   ____________________________________________   First MD Initiated Contact with Patient 03/28/17 [phone]     (approximate)  I have reviewed the triage vital signs and the nursing notes.   HISTORY  Chief Complaint Fall    HPI Kara Moore is a 82 y.o. female is a history of occasional confusion, diabetes, hypothyroidism and memory loss.  Here for evaluation after a fall. The patient reports she was using her bedside commode, as she went to sit on it and moved backwards and she fell onto her buttock. She is not able to get herself up, and reports this happened during the night but she's not sure exactly what time. She didn't hit her head or injure her neck or back. EMS was called because of the fall, and they noted that she seemed to have some soreness in the right hip area however the patient reports that she doesn't have any pain or discomfort in that area. She presently denies any pain. No chest pain. No headache. No nausea or vomiting.  She is recently seen urology and was started on a new medicine for urinary incontinence, reports that she still continues to have urinary frequently but incontinence has improved. No dysuria. Reports a few weeks ago she was tested for urinary tract infection and did not have one. She does report for the last several weeks she's felt a little more tired than normal, but denies any other concerns.   Past Medical History:  Diagnosis Date  . Benign essential hypertension   . Confusion   . Diabetes mellitus without complication (HCC)   . Hyperlipidemia LDL goal < 100   . Hypothyroidism   . Macular degeneration   . Memory loss   . Senile osteoporosis     Patient Active Problem List   Diagnosis Date Noted  . Mixed incontinence urge and stress 12/12/2016  . Primary osteoarthritis of both knees 07/21/2014  . Essential hypertension, benign 07/21/2014  . Other and unspecified  hyperlipidemia 07/21/2014  . Osteoarthritis of right knee 01/31/2014  . Diabetes mellitus type 2, uncomplicated (HCC) 01/17/2014  . Right calf pain 01/17/2014  . Parkinson's disease (tremor, stiffness, slow motion, unstable posture) (HCC) 10/19/2013  . Memory loss   . Hypothyroidism   . Macular degeneration   . Senile osteoporosis   . Hyperlipidemia   . Recurrent falls 06/17/2013    Past Surgical History:  Procedure Laterality Date  . ADENOIDECTOMY  2011    Prior to Admission medications   Medication Sig Start Date End Date Taking? Authorizing Provider  acetaminophen (TYLENOL) 500 MG tablet Take 1,000 mg by mouth 2 (two) times daily.    [provider]  alum & mag hydroxide-simeth (MINTOX) 200-200-20 MG/5ML suspension Take 30 mLs by mouth every 6 (six) hours as needed for indigestion or heartburn.    [provider]  aspirin 81 MG chewable tablet Chew 81 mg by mouth daily.     [provider]  calcium carbonate (OS-CAL - DOSED IN MG OF ELEMENTAL CALCIUM) 1250 (500 CA) MG tablet Take 1 tablet by mouth daily.     [provider]  carbidopa-levodopa (SINEMET IR) 25-100 MG tablet Take 1 tablet by mouth 4 (four) times daily. Take at 8, 12, 4 PM, 8 PM. 09/12/16   Huston Foley, MD  Cholecalciferol 2000 UNITS CAPS Take 1 capsule (2,000 Units total) by mouth daily. 10/28/14   Reed, Tiffany L, DO  fluticasone (FLONASE) 50 MCG/ACT nasal  spray Place 2 sprays into the nose as needed.     [provider]  furosemide (LASIX) 20 MG tablet Take 1/2 tablet orally every day    [provider]  glipiZIDE (GLUCOTROL) 5 MG tablet Take 5 mg by mouth daily before breakfast. For Diabetes (elevated blood sugar) 10/18/13   Reed, Tiffany L, DO  levothyroxine (SYNTHROID, LEVOTHROID) 50 MCG tablet TAKE 1 TABLET BY MOUTH DAILY BEFORE BREAKFAST ON EMPTY STOMACH *BINGO CARD* 11/03/14   Sharon SellerEubanks, Jessica K, NP  loperamide (IMODIUM) 2 MG capsule Take 2 mg by mouth. Take  one tablet orally with each loose stool as need for diarrhea * NTE 8 doses in 24 hours*    [provider]  magnesium hydroxide (MILK OF MAGNESIA) 400 MG/5ML suspension Take by mouth daily as needed for constipation.    [provider]  memantine (NAMENDA) 5 MG tablet Take 1 tablet (5 mg total) by mouth 2 (two) times daily. 12/12/16   Reed, Tiffany L, DO  metFORMIN (GLUCOPHAGE) 500 MG tablet Take 1 tablet (500 mg total) by mouth 2 (two) times daily with a meal. 01/30/15   Reed, Tiffany L, DO  mirabegron ER (MYRBETRIQ) 50 MG TB24 tablet Take 50 mg by mouth daily.    [provider]  mirtazapine (REMERON) 7.5 MG tablet Take 1 tablet (7.5 mg total) by mouth at bedtime. 02/05/16   Reed, Tiffany L, DO  Multiple Vitamins-Minerals (ICAPS MV) TABS Take 2 tablets by mouth daily.     [provider]  potassium chloride (K-DUR,KLOR-CON) 10 MEQ tablet Take 10 mEq by mouth daily. Take to prevent potassium loss (due to taking fluid pill)    [provider]  pravastatin (PRAVACHOL) 80 MG tablet Take 1 tablet (80 mg total) by mouth daily. For Cholesterol 02/05/16   Reed, Tiffany L, DO  ranitidine (ZANTAC) 150 MG capsule Take 150 mg by mouth at bedtime.     [provider]  traMADol (ULTRAM) 50 MG tablet Take one tablet by mouth twice daily for pain; Take one tablet by mouth at bedtime for pain 11/27/16   Kirt Boysarter, Monica, DO    Allergies Patient has no known allergies.  Family History  Problem Relation Age of Onset  . Heart attack Father   . Cancer Sister   . Heart attack Brother   . Diabetes Brother   . Tremor Maternal Grandmother   . Diabetes Paternal Grandfather   . Tremor Maternal Aunt   . Diabetes Paternal Grandmother     Social History Social History  Substance Use Topics  . Smoking status: Never Smoker  . Smokeless tobacco: Never Used  . Alcohol use No    Review of Systems Constitutional: No fever/chills Eyes: No visual changes. ENT: No  sore throat. Cardiovascular: Denies chest pain. Respiratory: Denies shortness of breath. Gastrointestinal: No abdominal pain.  No nausea, no vomiting.  Genitourinary: Negative for dysuria.See history of present illness Musculoskeletal: Negative for back pain. No neck pain. Denies any pain in the back legs or hips at this time. Skin: Negative for rash. Neurological: Negative for headaches, focal weakness or numbness.  10-point ROS otherwise negative.  ____________________________________________   PHYSICAL EXAM:  VITAL SIGNS: ED Triage Vitals  Enc Vitals Group     BP 03/28/17 0725 (!) 181/92     Pulse Rate 03/28/17 0725 86     Resp 03/28/17 0725 18     Temp 03/28/17 0725 98.2 F (36.8 C)     Temp Source 03/28/17 0725  Oral     SpO2 03/28/17 0725 100 %     Weight 03/28/17 0726 140 lb (63.5 kg)     Height 03/28/17 0726 5\' 5"  (1.651 m)     Head Circumference --      Peak Flow --      Pain Score --      Pain Loc --      Pain Edu? --      Excl. in GC? --     Constitutional: Alert and oriented. Well appearing and in no acute distress.Very pleasant. She is oriented to year day and month. Eyes: Conjunctivae are normal. PERRL. EOMI. Head: Atraumatic. Nose: No congestion/rhinnorhea. Mouth/Throat: Mucous membranes are moist.  Oropharynx non-erythematous. Neck: No stridor.  No cervical tenderness. Full range of motion without pain. Cardiovascular: Normal rate, regular rhythm. Grossly normal heart sounds.  Good peripheral circulation. Respiratory: Normal respiratory effort.  No retractions. Lungs CTAB. Gastrointestinal: Soft and nontender. No distention.  Musculoskeletal: RIGHT Right upper extremity demonstrates normal strength, good use of all muscles. No edema bruising or contusions of the right shoulder/upper arm, right elbow, right forearm / hand. Full range of motion of the right right upper extremity without pain. No evidence of trauma. Strong radial pulse. Intact  median/ulnar/radial neuro-muscular exam.  LEFT Left upper extremity demonstrates normal strength, good use of all muscles. No edema bruising or contusions of the left shoulder/upper arm, left elbow, left forearm / hand. Full range of motion of the left  upper extremity without pain. No evidence of trauma. Strong radial pulse. Intact median/ulnar/radial neuro-muscular exam.  Lower Extremities  No edema. Normal DP/PT pulses bilateral with good cap refill.  Normal neuro-motor function lower extremities bilateral.  RIGHT Right lower extremity demonstrates normal strength, good use of all muscles. No edema bruising or contusions of the right hip, right knee, right ankle. Full range of motion of the right lower extremity without pain. No pain on axial loading. No evidence of trauma.  LEFT Left lower extremity demonstrates normal strength, good use of all muscles. No edema bruising or contusions of the hip,  knee, ankle. Full range of motion of the left lower extremity without pain. No pain on axial loading. No evidence of trauma.  Neurologic:  Normal speech and language. No gross focal neurologic deficits are appreciated. Skin:  Skin is warm, dry and intact. No rash noted. Psychiatric: Mood and affect are normal. Speech and behavior are normal.  ____________________________________________   LABS (all labs ordered are listed, but only abnormal results are displayed)  Labs Reviewed  BASIC METABOLIC PANEL - Abnormal; Notable for the following:       Result Value   Glucose, Bld 149 (*)    All other components within normal limits  URINALYSIS, COMPLETE (UACMP) WITH MICROSCOPIC - Abnormal; Notable for the following:    Color, Urine YELLOW (*)    APPearance CLEAR (*)    Ketones, ur 5 (*)    Squamous Epithelial / LPF 0-5 (*)    All other components within normal limits  URINE CULTURE  CBC  CK   ____________________________________________  EKG  Reviewed and interpreted me at 7:30  AM Heart rate 85 QRS 95 QTC 420 Normal sinus rhythm, no evidence of ischemia or ectopy noted. ____________________________________________  RADIOLOGY   ____________________________________________   PROCEDURES  Procedure(s) performed: None  Procedures  Critical Care performed: No  ____________________________________________   INITIAL IMPRESSION / ASSESSMENT AND PLAN / ED COURSE  Pertinent labs & imaging results that were available during  my care of the patient were reviewed by me and considered in my medical decision making (see chart for details).  Patient presents after a fall. Describes a mechanical fall where her commode slipped while getting onto it. She did fall during the evening, and she is not sure how long she was on the ground but did require assistance to get up and was brought by EMS. At present denies any pain or symptoms, she does report some slight fatigue over the last few weeks and we'll check basic labs and urinalysis and EKG. No evidence of musculoskeletal injury, and the patient is now able to walk using a walker without difficulty or pain.  ----------------------------------------- 10:24 AM on 03/28/2017 -----------------------------------------  Patient resting comfortably, family willing and agreeable taking patient backed [place] [facility]. She is able to relate without difficulty or pain, does note slight cramping in her calves after setting the bed for a while requesting a Tylenol but denies any ongoing concerns.  Return precautions and treatment recommendations and follow-up discussed with the patient who is agreeable with the plan.       ____________________________________________   FINAL CLINICAL IMPRESSION(S) / ED DIAGNOSES  Final diagnoses:  Fall, initial encounter      NEW MEDICATIONS STARTED DURING THIS VISIT:  New Prescriptions   No medications on file     Note:  This document was prepared using Dragon voice recognition  software and may include unintentional dictation errors.     Sharyn Creamer, MD 03/28/17 1024

## 2017-03-28 NOTE — ED Notes (Signed)
Pt verbalized understanding of discharge instructions. NAD at this time. 

## 2017-03-28 NOTE — ED Triage Notes (Signed)
Pt arrived via [facility] from [facility] c/o fall. EMS reports pt had an unwitnessed fall, with unknown down time. EMS reports at scene pt reports right hip discomfort. Pt denies pain now states "it's just a little sore". Pt alert and oriented in no acute distress at this time.

## 2017-03-28 NOTE — Discharge Instructions (Signed)
You have been seen in the Emergency Department (ED) today for a fall.  Your work up does not show any concerning injuries.   ? ?Please follow up with your doctor regarding today's Emergency Department (ED) visit and your recent fall.   ? ?Return to the ED if you have any headache, confusion, slurred speech, weakness/numbness of any arm or leg, or any increased pain. ? ?

## 2017-03-28 NOTE — ED Notes (Signed)
Pt able to ambulate slowly with a walker without distress noted. Pt reports feeling "stiff" but denies pain.

## 2017-03-29 LAB — URINE CULTURE: SPECIAL REQUESTS: NORMAL

## 2017-04-04 ENCOUNTER — Encounter: Payer: Self-pay | Admitting: Emergency Medicine

## 2017-04-04 ENCOUNTER — Emergency Department: Payer: Medicare Other

## 2017-04-04 ENCOUNTER — Emergency Department
Admission: EM | Admit: 2017-04-04 | Discharge: 2017-04-04 | Disposition: A | Discharge status: Home / Self Care | Payer: Medicare Other | Attending: Emergency Medicine | Admitting: Emergency Medicine

## 2017-04-04 ENCOUNTER — Telehealth: Payer: Self-pay

## 2017-04-04 DIAGNOSIS — Z79899 Other long term (current) drug therapy: Secondary | ICD-10-CM | POA: Insufficient documentation

## 2017-04-04 DIAGNOSIS — R252 Cramp and spasm: Secondary | ICD-10-CM

## 2017-04-04 DIAGNOSIS — R410 Disorientation, unspecified: Secondary | ICD-10-CM | POA: Diagnosis present

## 2017-04-04 DIAGNOSIS — E119 Type 2 diabetes mellitus without complications: Secondary | ICD-10-CM | POA: Insufficient documentation

## 2017-04-04 DIAGNOSIS — E039 Hypothyroidism, unspecified: Secondary | ICD-10-CM | POA: Insufficient documentation

## 2017-04-04 DIAGNOSIS — Z7984 Long term (current) use of oral hypoglycemic drugs: Secondary | ICD-10-CM | POA: Diagnosis not present

## 2017-04-04 DIAGNOSIS — M62838 Other muscle spasm: Secondary | ICD-10-CM | POA: Diagnosis not present

## 2017-04-04 DIAGNOSIS — E860 Dehydration: Secondary | ICD-10-CM | POA: Insufficient documentation

## 2017-04-04 DIAGNOSIS — I10 Essential (primary) hypertension: Secondary | ICD-10-CM | POA: Diagnosis not present

## 2017-04-04 DIAGNOSIS — Z7401 Bed confinement status: Secondary | ICD-10-CM | POA: Diagnosis not present

## 2017-04-04 DIAGNOSIS — M79672 Pain in left foot: Secondary | ICD-10-CM | POA: Diagnosis not present

## 2017-04-04 DIAGNOSIS — Z7982 Long term (current) use of aspirin: Secondary | ICD-10-CM | POA: Insufficient documentation

## 2017-04-04 DIAGNOSIS — R4182 Altered mental status, unspecified: Secondary | ICD-10-CM | POA: Diagnosis not present

## 2017-04-04 DIAGNOSIS — R531 Weakness: Secondary | ICD-10-CM | POA: Diagnosis not present

## 2017-04-04 LAB — CBC WITH DIFFERENTIAL/PLATELET
BASOS ABS: 0 10*3/uL (ref 0–0.1)
BASOS PCT: 1 %
EOS ABS: 0.1 10*3/uL (ref 0–0.7)
Eosinophils Relative: 1 %
HCT: 40.3 % (ref 35.0–47.0)
Hemoglobin: 13.4 g/dL (ref 12.0–16.0)
Lymphocytes Relative: 18 %
Lymphs Abs: 1.2 10*3/uL (ref 1.0–3.6)
MCH: 30.6 pg (ref 26.0–34.0)
MCHC: 33.3 g/dL (ref 32.0–36.0)
MCV: 91.8 fL (ref 80.0–100.0)
MONOS PCT: 8 %
Monocytes Absolute: 0.6 10*3/uL (ref 0.2–0.9)
NEUTROS PCT: 72 %
Neutro Abs: 5.1 10*3/uL (ref 1.4–6.5)
PLATELETS: 260 10*3/uL (ref 150–440)
RBC: 4.39 MIL/uL (ref 3.80–5.20)
RDW: 13.9 % (ref 11.5–14.5)
WBC: 7 10*3/uL (ref 3.6–11.0)

## 2017-04-04 LAB — URINALYSIS, COMPLETE (UACMP) WITH MICROSCOPIC
Bacteria, UA: NONE SEEN
Bilirubin Urine: NEGATIVE
GLUCOSE, UA: NEGATIVE mg/dL
Hgb urine dipstick: NEGATIVE
Ketones, ur: 5 mg/dL — AB
Leukocytes, UA: NEGATIVE
Nitrite: NEGATIVE
PROTEIN: NEGATIVE mg/dL
SPECIFIC GRAVITY, URINE: 1.008 (ref 1.005–1.030)
Squamous Epithelial / LPF: NONE SEEN
pH: 7 (ref 5.0–8.0)

## 2017-04-04 LAB — BASIC METABOLIC PANEL
Anion gap: 8 (ref 5–15)
BUN: 17 mg/dL (ref 6–20)
CALCIUM: 9.4 mg/dL (ref 8.9–10.3)
CO2: 28 mmol/L (ref 22–32)
CREATININE: 1.01 mg/dL — AB (ref 0.44–1.00)
Chloride: 100 mmol/L — ABNORMAL LOW (ref 101–111)
GFR calc Af Amer: 59 mL/min — ABNORMAL LOW (ref >60)
GFR calc non Af Amer: 51 mL/min — ABNORMAL LOW (ref >60)
GLUCOSE: 152 mg/dL — AB (ref 65–99)
Potassium: 4.2 mmol/L (ref 3.5–5.1)
Sodium: 136 mmol/L (ref 135–145)

## 2017-04-04 LAB — HEPATIC FUNCTION PANEL
ALT: 17 U/L (ref 14–54)
AST: 15 U/L (ref 15–41)
Albumin: 4 g/dL (ref 3.5–5.0)
Alkaline Phosphatase: 64 U/L (ref 38–126)
Total Bilirubin: 0.6 mg/dL (ref 0.3–1.2)
Total Protein: 7.1 g/dL (ref 6.5–8.1)

## 2017-04-04 LAB — PHOSPHORUS: Phosphorus: 4.1 mg/dL (ref 2.5–4.6)

## 2017-04-04 LAB — MAGNESIUM: Magnesium: 2.1 mg/dL (ref 1.7–2.4)

## 2017-04-04 MED ORDER — ACETAMINOPHEN 500 MG PO TABS
1000.0000 mg | ORAL_TABLET | Freq: Once | ORAL | Status: AC
  Administered 2017-04-04: 1000 mg via ORAL

## 2017-04-04 MED ORDER — ACETAMINOPHEN 500 MG PO TABS
ORAL_TABLET | ORAL | Status: AC
  Administered 2017-04-04: 1000 mg via ORAL
  Filled 2017-04-04: qty 2

## 2017-04-04 MED ORDER — SODIUM CHLORIDE 0.9 % IV BOLUS (SEPSIS)
1000.0000 mL | Freq: Once | INTRAVENOUS | Status: AC
  Administered 2017-04-04: 1000 mL via INTRAVENOUS

## 2017-04-04 NOTE — Telephone Encounter (Signed)
Belinda with [facility] called to inform Dr.Reed patient c/o leg weakness x 2-3 days. Patient w/o swelling or pain.  Patient did experience a fall last Friday 03/28/17  Recent vitals: 140/74, no additional vitals provided  Please advise

## 2017-04-04 NOTE — ED Provider Notes (Signed)
[facility] Emergency Department Provider Note  ____________________________________________   First MD Initiated Contact with Patient 04/04/17 1913     (approximate)  I have reviewed the triage vital signs and the nursing notes.   HISTORY  Chief Complaint Foot Pain (Pt. here via EMS from [facility] for lt. foot cramping.)   HPI Kara Moore is a 82 y.o. female who comes to the emergency department via EMS with roughly 24-48 hours of intermittent left foot cramping. EMS came to her nursing home they said the initial call was for stroke however when they arrived the patient said she had never been weak and her only concern was the cramping in her left leg. Staff said that the patient normally is able to ambulate with a walker but is needed more assistance for the past day or so. They also noted that she was drooling more than usual yesterday.   Past Medical History:  Diagnosis Date  . Benign essential hypertension   . Confusion   . Diabetes mellitus without complication (HCC)   . Hyperlipidemia LDL goal < 100   . Hypothyroidism   . Macular degeneration   . Memory loss   . Senile osteoporosis     Patient Active Problem List   Diagnosis Date Noted  . Mixed incontinence urge and stress 12/12/2016  . Primary osteoarthritis of both knees 07/21/2014  . Essential hypertension, benign 07/21/2014  . Other and unspecified hyperlipidemia 07/21/2014  . Osteoarthritis of right knee 01/31/2014  . Diabetes mellitus type 2, uncomplicated (HCC) 01/17/2014  . Right calf pain 01/17/2014  . Parkinson's disease (tremor, stiffness, slow motion, unstable posture) (HCC) 10/19/2013  . Memory loss   . Hypothyroidism   . Macular degeneration   . Senile osteoporosis   . Hyperlipidemia   . Recurrent falls 06/17/2013    Past Surgical History:  Procedure Laterality Date  . ADENOIDECTOMY  2011    Prior to Admission medications   Medication Sig Start Date End  Date Taking? Authorizing Provider  acetaminophen (TYLENOL) 500 MG tablet Take 1,000 mg by mouth 2 (two) times daily.    [provider]  alum & mag hydroxide-simeth (MINTOX) 200-200-20 MG/5ML suspension Take 30 mLs by mouth every 6 (six) hours as needed for indigestion or heartburn.    [provider]  aspirin 81 MG chewable tablet Chew 81 mg by mouth daily.     [provider]  calcium carbonate (OS-CAL - DOSED IN MG OF ELEMENTAL CALCIUM) 1250 (500 CA) MG tablet Take 1 tablet by mouth daily.     [provider]  carbidopa-levodopa (SINEMET IR) 25-100 MG tablet Take 1 tablet by mouth 4 (four) times daily. Take at 8, 12, 4 PM, 8 PM. 09/12/16   Huston FoleyAthar, Saima, MD  Cholecalciferol 2000 UNITS CAPS Take 1 capsule (2,000 Units total) by mouth daily. 10/28/14   Reed, Tiffany L, DO  fluticasone (FLONASE) 50 MCG/ACT nasal spray Place 2 sprays into the nose as needed.     [provider]  furosemide (LASIX) 20 MG tablet Take 1/2 tablet orally every day    [provider]  glipiZIDE (GLUCOTROL) 5 MG tablet Take 5 mg by mouth daily before breakfast. For Diabetes (elevated blood sugar) 10/18/13   Reed, Tiffany L, DO  guaifenesin (ROBITUSSIN) 100 MG/5ML syrup Take 200 mg by mouth 4 (four) times daily as needed for cough.    [provider]  levothyroxine (SYNTHROID, LEVOTHROID) 50 MCG tablet TAKE 1 TABLET BY MOUTH  DAILY BEFORE BREAKFAST ON EMPTY STOMACH *BINGO CARD* 11/03/14   Sharon Seller, NP  loperamide (IMODIUM) 2 MG capsule Take 2 mg by mouth. Take one tablet orally with each loose stool as need for diarrhea * NTE 8 doses in 24 hours*    [provider]  magnesium hydroxide (MILK OF MAGNESIA) 400 MG/5ML suspension Take by mouth daily as needed for constipation.    [provider]  memantine (NAMENDA) 5 MG tablet Take 1 tablet (5 mg total) by mouth 2 (two) times daily. 12/12/16   Reed, Tiffany L, DO  metFORMIN (GLUCOPHAGE) 500 MG  tablet Take 1 tablet (500 mg total) by mouth 2 (two) times daily with a meal. 01/30/15   Reed, Tiffany L, DO  mirabegron ER (MYRBETRIQ) 50 MG TB24 tablet Take 50 mg by mouth daily.    [provider]  mirtazapine (REMERON) 7.5 MG tablet Take 1 tablet (7.5 mg total) by mouth at bedtime. 02/05/16   Reed, Tiffany L, DO  Multiple Vitamins-Minerals (ICAPS MV) TABS Take 2 tablets by mouth daily.     [provider]  neomycin-bacitracin-polymyxin (NEOSPORIN) [phone] ointment Apply 1 application topically 4 (four) times daily.    [provider]  potassium chloride (K-DUR,KLOR-CON) 10 MEQ tablet Take 10 mEq by mouth daily. Take to prevent potassium loss (due to taking fluid pill)    [provider]  pravastatin (PRAVACHOL) 80 MG tablet Take 1 tablet (80 mg total) by mouth daily. For Cholesterol 02/05/16   Reed, Tiffany L, DO  ranitidine (ZANTAC) 150 MG capsule Take 150 mg by mouth at bedtime.     [provider]  Skin Protectants, Misc. (DIMETHICONE-ZINC OXIDE) cream Apply topically 3 (three) times daily.    [provider]  traMADol (ULTRAM) 50 MG tablet Take one tablet by mouth twice daily for pain; Take one tablet by mouth at bedtime for pain 11/27/16   Kirt Boys, DO    Allergies Patient has no known allergies.  Family History  Problem Relation Age of Onset  . Heart attack Father   . Cancer Sister   . Heart attack Brother   . Diabetes Brother   . Tremor Maternal Grandmother   . Diabetes Paternal Grandfather   . Tremor Maternal Aunt   . Diabetes Paternal Grandmother     Social History Social History  Substance Use Topics  . Smoking status: Never Smoker  . Smokeless tobacco: Never Used  . Alcohol use No    Review of Systems Constitutional: No fever/chills Eyes: No visual changes. ENT: No sore throat. Cardiovascular: Denies chest pain. Respiratory: Denies shortness of breath. Gastrointestinal: No abdominal pain.  No nausea, no  vomiting.  No diarrhea.  No constipation. Genitourinary: Negative for dysuria. Musculoskeletal: Negative for back pain. Skin: Negative for rash. Neurological: Negative for headaches, focal weakness or numbness.   ____________________________________________   PHYSICAL EXAM:  VITAL SIGNS: ED Triage Vitals  Enc Vitals Group     BP      Pulse      Resp      Temp      Temp src      SpO2      Weight      Height      Head Circumference      Peak Flow      Pain Score      Pain Loc      Pain Edu?      Excl. in GC?     Constitutional: Alert  and oriented x 4 Pleasant cooperative chronically ill-appearing but in no distress Eyes: PERRL EOMI. Head: Atraumatic. Nose: No congestion/rhinnorhea. Mouth/Throat: No trismus Neck: No stridor.   Cardiovascular: Normal rate, regular rhythm. Grossly normal heart sounds.  Good peripheral circulation. Respiratory: Normal respiratory effort.  No retractions. Lungs CTAB and moving good air Gastrointestinal: Soft nontender Musculoskeletal: No contractures Neurologic:  Normal speech and language. No gross focal neurologic deficits are appreciated. Skin:  Skin is warm, dry and intact. No rash noted. Psychiatric: Mood and affect are normal. Speech and behavior are normal.    ____________________________________________    ____________________________________________   LABS (all labs ordered are listed, but only abnormal results are displayed)  Labs Reviewed  BASIC METABOLIC PANEL - Abnormal; Notable for the following:       Result Value   Chloride 100 (*)    Glucose, Bld 152 (*)    Creatinine, Ser 1.01 (*)    GFR calc non Af Amer 51 (*)    GFR calc Af Amer 59 (*)    All other components within normal limits  HEPATIC FUNCTION PANEL - Abnormal; Notable for the following:    Bilirubin, Direct <0.1 (*)    All other components within normal limits  CBC WITH DIFFERENTIAL/PLATELET  MAGNESIUM  PHOSPHORUS  URINALYSIS, COMPLETE (UACMP)  WITH MICROSCOPIC    Suggestion of acute kidney injury but otherwise no metabolic derangements __________________________________________  EKG  ED ECG REPORT I, Merrily Brittle, the attending physician, personally viewed and interpreted this ECG.  Date: 04/04/2017 Rate: 85 Rhythm: normal sinus rhythm QRS Axis: normal Intervals: normal ST/T Wave abnormalities: normal Conduction Disturbances: none Narrative Interpretation: unremarkable  ____________________________________________  RADIOLOGY  Head CT with no acute disease ____________________________________________   PROCEDURES  Procedure(s) performed: no  Procedures  Critical Care performed: no  Observation: no ____________________________________________   INITIAL IMPRESSION / ASSESSMENT AND PLAN / ED COURSE  Pertinent labs & imaging results that were available during my care of the patient were reviewed by me and considered in my medical decision making (see chart for details).  The patient is well-appearing and neuro intact but given the possible altered mental status CT scan was obtained which fortunately is negative for acute pathology. Labs are largely unremarkable aside from a doubling in her creatinine. While the creatinine is still normal this likely represents dehydration and acute kidney injury. She does not warrant any electrolyte repletion but I will give her a liter of fluid and discharge her home. The patient and family understands and agrees the plan.    ____________________________________________   FINAL CLINICAL IMPRESSION(S) / ED DIAGNOSES  Final diagnoses:  Dehydration  Muscle cramps      NEW MEDICATIONS STARTED DURING THIS VISIT:  New Prescriptions   No medications on file     Note:  This document was prepared using Dragon voice recognition software and may include unintentional dictation errors.     Merrily Brittle, MD 04/04/17 2043

## 2017-04-04 NOTE — Discharge Instructions (Signed)
Please make sure you remain well-hydrated to help send off these cramps. Return to the emergency department for any concerns and otherwise follow-up with your primary care physician as needed.  It was a pleasure to take care of you today, and thank you for coming to our emergency department.  If you have any questions or concerns before leaving please ask the nurse to grab me and I'm more than happy to go through your aftercare instructions again.  If you were prescribed any opioid pain medication today such as Norco, Vicodin, Percocet, morphine, hydrocodone, or oxycodone please make sure you do not drive when you are taking this medication as it can alter your ability to drive safely.  If you have any concerns once you are home that you are not improving or are in fact getting worse before you can make it to your follow-up appointment, please do not hesitate to call [phone] and come back for further evaluation.  Merrily Brittle MD  Results for orders placed or performed during the hospital encounter of 04/04/17  Basic metabolic panel  Result Value Ref Range   Sodium 136 135 - 145 mmol/L   Potassium 4.2 3.5 - 5.1 mmol/L   Chloride 100 (L) 101 - 111 mmol/L   CO2 28 22 - 32 mmol/L   Glucose, Bld 152 (H) 65 - 99 mg/dL   BUN 17 6 - 20 mg/dL   Creatinine, Ser 0.98 (H) 0.44 - 1.00 mg/dL   Calcium 9.4 8.9 - 11.9 mg/dL   GFR calc non Af Amer 51 (L) >60 mL/min   GFR calc Af Amer 59 (L) >60 mL/min   Anion gap 8 5 - 15  Hepatic function panel  Result Value Ref Range   Total Protein 7.1 6.5 - 8.1 g/dL   Albumin 4.0 3.5 - 5.0 g/dL   AST 15 15 - 41 U/L   ALT 17 14 - 54 U/L   Alkaline Phosphatase 64 38 - 126 U/L   Total Bilirubin 0.6 0.3 - 1.2 mg/dL   Bilirubin, Direct <1.4 (L) 0.1 - 0.5 mg/dL   Indirect Bilirubin NOT CALCULATED 0.3 - 0.9 mg/dL  CBC with Differential  Result Value Ref Range   WBC 7.0 3.6 - 11.0 K/uL   RBC 4.39 3.80 - 5.20 MIL/uL   Hemoglobin 13.4 12.0 - 16.0 g/dL   HCT 78.2 95.6 -  21.3 %   MCV 91.8 80.0 - 100.0 fL   MCH 30.6 26.0 - 34.0 pg   MCHC 33.3 32.0 - 36.0 g/dL   RDW 08.6 57.8 - 46.9 %   Platelets 260 150 - 440 K/uL   Neutrophils Relative % 72 %   Neutro Abs 5.1 1.4 - 6.5 K/uL   Lymphocytes Relative 18 %   Lymphs Abs 1.2 1.0 - 3.6 K/uL   Monocytes Relative 8 %   Monocytes Absolute 0.6 0.2 - 0.9 K/uL   Eosinophils Relative 1 %   Eosinophils Absolute 0.1 0 - 0.7 K/uL   Basophils Relative 1 %   Basophils Absolute 0.0 0 - 0.1 K/uL  Magnesium  Result Value Ref Range   Magnesium 2.1 1.7 - 2.4 mg/dL  Phosphorus  Result Value Ref Range   Phosphorus 4.1 2.5 - 4.6 mg/dL   Ct Head Wo Contrast  Result Date: 04/04/2017 CLINICAL DATA:  Altered mental status. Increased confusion. Leg weakness. EXAM: CT HEAD WITHOUT CONTRAST TECHNIQUE: Contiguous axial images were obtained from the base of the skull through the vertex without intravenous contrast. COMPARISON:  CT head without contrast 05/24/2016. FINDINGS: Brain: Mild atrophy and white matter changes are within normal limits for age. No acute infarct, hemorrhage, or mass lesion is present. The ventricles are proportionate to the degree of atrophy. Slight prominence of the extra-axial spaces is present without change. No significant extra-axial collection is present. The brainstem and cerebellum are within normal limits. Vascular: Atherosclerotic calcifications are present in the cavernous internal carotid arteries and at the dural margin of the right vertebral artery. There is no hyperdense vessel. Skull: The calvarium is intact. No focal lytic or blastic lesions are present. No significant extracranial soft tissue lesion is present. Sinuses/Orbits: The paranasal sinuses and mastoid air cells are clear. The globes and orbits are within normal limits. IMPRESSION: Normal CT of the head for age. No acute or focal intracranial abnormality to explain the patient's altered mental status or leg weakness. Electronically Signed   By:  Marin Robertshristopher  Mattern M.D.   On: 04/04/2017 19:54

## 2017-04-04 NOTE — ED Triage Notes (Signed)
Pt. States intermittent lt. Foot cramping that started yesterday morning.  Pt. Here via EMS from [facility].

## 2017-04-04 NOTE — ED Notes (Signed)
Pt. States lt. Foot cramping started yesterday morning.  PAD intact on both lower extremities.  Pt. Reports difficulty in memory recall for the past month.  Daughter reports pt. Did not appear to be herself this afternoon.

## 2017-04-04 NOTE — ED Notes (Signed)
Pt. Helped up to use bathroom, pt. Unable to walk with cramped lt. Foot.  Pt. Put on bedpan.

## 2017-04-04 NOTE — ED Notes (Signed)
Called and talked with Crystal at [facility] to let them know pt. Condition and they would be returning via [facility].

## 2017-04-04 NOTE — ED Notes (Signed)
Pt. Went home([facility]) via [facility].

## 2017-04-04 NOTE — ED Notes (Signed)
Pt. Used [facility] walker to ambulate to toilet, pt. Denies pain to lt. Foot.

## 2017-04-05 NOTE — Telephone Encounter (Signed)
Apparently, she went to the ED about this where no weakness was reported, only pain.  She had end stage DJD in both knees and cramping related to her parkinson's disease and not drinking enough water b/c of urinary incontinence concerns.

## 2017-04-07 NOTE — Telephone Encounter (Signed)
No answer at [facility], phone just rang, will try again later

## 2017-04-28 DIAGNOSIS — H353221 Exudative age-related macular degeneration, left eye, with active choroidal neovascularization: Secondary | ICD-10-CM | POA: Diagnosis not present

## 2017-05-11 ENCOUNTER — Inpatient Hospital Stay
Admission: EM | Admit: 2017-05-11 | Discharge: 2017-05-14 | DRG: 470 | Disposition: A | Discharge status: Skilled Nursing Facility | Payer: Medicare Other | Attending: Internal Medicine | Admitting: Internal Medicine

## 2017-05-11 ENCOUNTER — Encounter
Admission: EM | Disposition: A | Discharge status: Skilled Nursing Facility | Payer: Self-pay | Source: Home / Self Care | Attending: Internal Medicine

## 2017-05-11 ENCOUNTER — Emergency Department: Payer: Medicare Other

## 2017-05-11 ENCOUNTER — Inpatient Hospital Stay: Payer: Medicare Other | Admitting: Anesthesiology

## 2017-05-11 ENCOUNTER — Inpatient Hospital Stay: Payer: Medicare Other

## 2017-05-11 DIAGNOSIS — S72009A Fracture of unspecified part of neck of unspecified femur, initial encounter for closed fracture: Secondary | ICD-10-CM | POA: Diagnosis not present

## 2017-05-11 DIAGNOSIS — Z7982 Long term (current) use of aspirin: Secondary | ICD-10-CM | POA: Diagnosis not present

## 2017-05-11 DIAGNOSIS — M6281 Muscle weakness (generalized): Secondary | ICD-10-CM | POA: Diagnosis not present

## 2017-05-11 DIAGNOSIS — S0990XA Unspecified injury of head, initial encounter: Secondary | ICD-10-CM | POA: Diagnosis not present

## 2017-05-11 DIAGNOSIS — H3530 Unspecified macular degeneration: Secondary | ICD-10-CM | POA: Diagnosis present

## 2017-05-11 DIAGNOSIS — E039 Hypothyroidism, unspecified: Secondary | ICD-10-CM | POA: Diagnosis present

## 2017-05-11 DIAGNOSIS — Z809 Family history of malignant neoplasm, unspecified: Secondary | ICD-10-CM | POA: Diagnosis not present

## 2017-05-11 DIAGNOSIS — W010XXA Fall on same level from slipping, tripping and stumbling without subsequent striking against object, initial encounter: Secondary | ICD-10-CM | POA: Diagnosis present

## 2017-05-11 DIAGNOSIS — S72041A Displaced fracture of base of neck of right femur, initial encounter for closed fracture: Secondary | ICD-10-CM | POA: Diagnosis not present

## 2017-05-11 DIAGNOSIS — M25351 Other instability, right hip: Secondary | ICD-10-CM | POA: Diagnosis not present

## 2017-05-11 DIAGNOSIS — I10 Essential (primary) hypertension: Secondary | ICD-10-CM | POA: Diagnosis not present

## 2017-05-11 DIAGNOSIS — M170 Bilateral primary osteoarthritis of knee: Secondary | ICD-10-CM | POA: Diagnosis present

## 2017-05-11 DIAGNOSIS — R262 Difficulty in walking, not elsewhere classified: Secondary | ICD-10-CM | POA: Diagnosis not present

## 2017-05-11 DIAGNOSIS — S72001A Fracture of unspecified part of neck of right femur, initial encounter for closed fracture: Secondary | ICD-10-CM

## 2017-05-11 DIAGNOSIS — Z833 Family history of diabetes mellitus: Secondary | ICD-10-CM

## 2017-05-11 DIAGNOSIS — M810 Age-related osteoporosis without current pathological fracture: Secondary | ICD-10-CM | POA: Diagnosis present

## 2017-05-11 DIAGNOSIS — Z8249 Family history of ischemic heart disease and other diseases of the circulatory system: Secondary | ICD-10-CM

## 2017-05-11 DIAGNOSIS — Z7984 Long term (current) use of oral hypoglycemic drugs: Secondary | ICD-10-CM | POA: Diagnosis not present

## 2017-05-11 DIAGNOSIS — Z96641 Presence of right artificial hip joint: Secondary | ICD-10-CM | POA: Diagnosis not present

## 2017-05-11 DIAGNOSIS — E785 Hyperlipidemia, unspecified: Secondary | ICD-10-CM | POA: Diagnosis present

## 2017-05-11 DIAGNOSIS — G20 Parkinson's disease: Secondary | ICD-10-CM | POA: Diagnosis present

## 2017-05-11 DIAGNOSIS — W19XXXA Unspecified fall, initial encounter: Secondary | ICD-10-CM | POA: Diagnosis not present

## 2017-05-11 DIAGNOSIS — Z471 Aftercare following joint replacement surgery: Secondary | ICD-10-CM | POA: Diagnosis not present

## 2017-05-11 DIAGNOSIS — S72011A Unspecified intracapsular fracture of right femur, initial encounter for closed fracture: Principal | ICD-10-CM | POA: Diagnosis present

## 2017-05-11 DIAGNOSIS — Z7951 Long term (current) use of inhaled steroids: Secondary | ICD-10-CM | POA: Diagnosis not present

## 2017-05-11 DIAGNOSIS — Z09 Encounter for follow-up examination after completed treatment for conditions other than malignant neoplasm: Secondary | ICD-10-CM

## 2017-05-11 DIAGNOSIS — M25551 Pain in right hip: Secondary | ICD-10-CM | POA: Diagnosis not present

## 2017-05-11 DIAGNOSIS — Z7401 Bed confinement status: Secondary | ICD-10-CM | POA: Diagnosis not present

## 2017-05-11 DIAGNOSIS — E119 Type 2 diabetes mellitus without complications: Secondary | ICD-10-CM | POA: Diagnosis present

## 2017-05-11 DIAGNOSIS — F0390 Unspecified dementia without behavioral disturbance: Secondary | ICD-10-CM | POA: Diagnosis not present

## 2017-05-11 HISTORY — PX: HIP ARTHROPLASTY: SHX981

## 2017-05-11 LAB — BASIC METABOLIC PANEL
ANION GAP: 8 (ref 5–15)
BUN: 14 mg/dL (ref 6–20)
CO2: 27 mmol/L (ref 22–32)
Calcium: 9.4 mg/dL (ref 8.9–10.3)
Chloride: 103 mmol/L (ref 101–111)
Creatinine, Ser: 0.72 mg/dL (ref 0.44–1.00)
GFR calc Af Amer: >60 mL/min (ref >60)
GLUCOSE: 161 mg/dL — AB (ref 65–99)
POTASSIUM: 4.2 mmol/L (ref 3.5–5.1)
Sodium: 138 mmol/L (ref 135–145)

## 2017-05-11 LAB — URINALYSIS, COMPLETE (UACMP) WITH MICROSCOPIC
Bilirubin Urine: NEGATIVE
GLUCOSE, UA: 50 mg/dL — AB
Hgb urine dipstick: NEGATIVE
KETONES UR: 20 mg/dL — AB
Leukocytes, UA: NEGATIVE
NITRITE: NEGATIVE
PROTEIN: NEGATIVE mg/dL
Specific Gravity, Urine: 1.014 (ref 1.005–1.030)
Squamous Epithelial / LPF: NONE SEEN
WBC UA: NONE SEEN WBC/hpf (ref 0–5)
pH: 8 (ref 5.0–8.0)

## 2017-05-11 LAB — CBC WITH DIFFERENTIAL/PLATELET
Basophils Absolute: 0 10*3/uL (ref 0–0.1)
Basophils Relative: 1 %
Eosinophils Absolute: 0.1 10*3/uL (ref 0–0.7)
Eosinophils Relative: 1 %
HEMATOCRIT: 39.9 % (ref 35.0–47.0)
Hemoglobin: 13.8 g/dL (ref 12.0–16.0)
LYMPHS ABS: 1.3 10*3/uL (ref 1.0–3.6)
Lymphocytes Relative: 16 %
MCH: 31.7 pg (ref 26.0–34.0)
MCHC: 34.7 g/dL (ref 32.0–36.0)
MCV: 91.4 fL (ref 80.0–100.0)
MONO ABS: 0.5 10*3/uL (ref 0.2–0.9)
MONOS PCT: 6 %
NEUTROS ABS: 6 10*3/uL (ref 1.4–6.5)
Neutrophils Relative %: 76 %
Platelets: 223 10*3/uL (ref 150–440)
RBC: 4.36 MIL/uL (ref 3.80–5.20)
RDW: 13.7 % (ref 11.5–14.5)
WBC: 7.9 10*3/uL (ref 3.6–11.0)

## 2017-05-11 LAB — GLUCOSE, CAPILLARY
GLUCOSE-CAPILLARY: 180 mg/dL — AB (ref 65–99)
GLUCOSE-CAPILLARY: 142 mg/dL — AB (ref 65–99)
GLUCOSE-CAPILLARY: 176 mg/dL — AB (ref 65–99)

## 2017-05-11 LAB — HEPATIC FUNCTION PANEL
ALBUMIN: 4.1 g/dL (ref 3.5–5.0)
ALT: 15 U/L (ref 14–54)
AST: 19 U/L (ref 15–41)
Alkaline Phosphatase: 76 U/L (ref 38–126)
BILIRUBIN DIRECT: 0.1 mg/dL (ref 0.1–0.5)
Indirect Bilirubin: 0.6 mg/dL (ref 0.3–0.9)
Total Bilirubin: 0.7 mg/dL (ref 0.3–1.2)
Total Protein: 7.3 g/dL (ref 6.5–8.1)

## 2017-05-11 LAB — MRSA PCR SCREENING: MRSA by PCR: NEGATIVE

## 2017-05-11 LAB — PROTIME-INR
INR: 1.06
Prothrombin Time: 13.8 seconds (ref 11.4–15.2)

## 2017-05-11 SURGERY — HEMIARTHROPLASTY, HIP, DIRECT ANTERIOR APPROACH, FOR FRACTURE
Anesthesia: Spinal | Laterality: Right

## 2017-05-11 MED ORDER — GABAPENTIN 300 MG PO CAPS: 300.0000 mg | ORAL_CAPSULE | Freq: Once | ORAL | Status: DC

## 2017-05-11 MED ORDER — ALUM & MAG HYDROXIDE-SIMETH 200-200-20 MG/5ML PO SUSP: 30.0000 mL | Freq: Four times a day (QID) | ORAL | Status: DC | PRN

## 2017-05-11 MED ORDER — CALCIUM CARBONATE ANTACID 500 MG PO CHEW
3.0000 | CHEWABLE_TABLET | Freq: Every day | ORAL | Status: DC
  Administered 2017-05-12 – 2017-05-14 (×3): 600 mg via ORAL
  Filled 2017-05-11 (×3): qty 3

## 2017-05-11 MED ORDER — MORPHINE SULFATE (PF) 2 MG/ML IV SOLN: 1.0000 mg | INTRAVENOUS | Status: DC | PRN

## 2017-05-11 MED ORDER — FENTANYL CITRATE (PF) 100 MCG/2ML IJ SOLN
50.0000 ug | Freq: Once | INTRAMUSCULAR | Status: AC
  Administered 2017-05-11: 50 ug via INTRAVENOUS
  Filled 2017-05-11: qty 2

## 2017-05-11 MED ORDER — METOCLOPRAMIDE HCL 10 MG PO TABS: 5.0000 mg | ORAL_TABLET | Freq: Three times a day (TID) | ORAL | Status: DC | PRN

## 2017-05-11 MED ORDER — TRANEXAMIC ACID 1000 MG/10ML IV SOLN
1000.0000 mg | INTRAVENOUS | Status: AC
  Administered 2017-05-11: 1000 mg via INTRAVENOUS
  Filled 2017-05-11 (×2): qty 10

## 2017-05-11 MED ORDER — FENTANYL CITRATE (PF) 100 MCG/2ML IJ SOLN
INTRAMUSCULAR | Status: AC
  Filled 2017-05-11: qty 2

## 2017-05-11 MED ORDER — POTASSIUM CHLORIDE CRYS ER 10 MEQ PO TBCR
10.0000 meq | EXTENDED_RELEASE_TABLET | Freq: Every day | ORAL | Status: DC
  Administered 2017-05-12 – 2017-05-14 (×3): 10 meq via ORAL
  Filled 2017-05-11 (×3): qty 1

## 2017-05-11 MED ORDER — ONDANSETRON HCL 4 MG/2ML IJ SOLN: 4.0000 mg | Freq: Four times a day (QID) | INTRAMUSCULAR | Status: DC | PRN

## 2017-05-11 MED ORDER — ASPIRIN 81 MG PO CHEW: 81.0000 mg | CHEWABLE_TABLET | Freq: Every day | ORAL | Status: DC

## 2017-05-11 MED ORDER — MAGNESIUM HYDROXIDE 400 MG/5ML PO SUSP
15.0000 mL | Freq: Every day | ORAL | Status: DC | PRN
  Administered 2017-05-14: 15 mL via ORAL
  Filled 2017-05-11: qty 30

## 2017-05-11 MED ORDER — ONDANSETRON HCL 4 MG/2ML IJ SOLN
INTRAMUSCULAR | Status: DC | PRN
  Administered 2017-05-11: 4 mg via INTRAVENOUS

## 2017-05-11 MED ORDER — CELECOXIB 200 MG PO CAPS
200.0000 mg | ORAL_CAPSULE | Freq: Two times a day (BID) | ORAL | Status: DC
  Administered 2017-05-11 – 2017-05-14 (×6): 200 mg via ORAL
  Filled 2017-05-11 (×6): qty 1

## 2017-05-11 MED ORDER — PHENYLEPHRINE HCL 10 MG/ML IJ SOLN
INTRAMUSCULAR | Status: DC | PRN
  Administered 2017-05-11 (×2): 80 ug via INTRAVENOUS

## 2017-05-11 MED ORDER — BUPIVACAINE HCL (PF) 0.5 % IJ SOLN
INTRAMUSCULAR | Status: DC | PRN
  Administered 2017-05-11: 3 mL

## 2017-05-11 MED ORDER — INSULIN ASPART 100 UNIT/ML ~~LOC~~ SOLN
0.0000 [IU] | Freq: Three times a day (TID) | SUBCUTANEOUS | Status: DC
  Administered 2017-05-12: 1 [IU] via SUBCUTANEOUS
  Administered 2017-05-12: 2 [IU] via SUBCUTANEOUS
  Administered 2017-05-12: 5 [IU] via SUBCUTANEOUS
  Administered 2017-05-13: 1 [IU] via SUBCUTANEOUS
  Administered 2017-05-13: 2 [IU] via SUBCUTANEOUS
  Administered 2017-05-13: 3 [IU] via SUBCUTANEOUS
  Administered 2017-05-14 (×2): 2 [IU] via SUBCUTANEOUS
  Administered 2017-05-14: 5 [IU] via SUBCUTANEOUS
  Filled 2017-05-11 (×9): qty 1

## 2017-05-11 MED ORDER — CEFAZOLIN SODIUM-DEXTROSE 2-4 GM/100ML-% IV SOLN
2.0000 g | INTRAVENOUS | Status: AC
  Administered 2017-05-11: 2 g via INTRAVENOUS
  Filled 2017-05-11: qty 100

## 2017-05-11 MED ORDER — TRAMADOL HCL 50 MG PO TABS
50.0000 mg | ORAL_TABLET | Freq: Two times a day (BID) | ORAL | Status: DC | PRN
  Administered 2017-05-11 – 2017-05-12 (×2): 50 mg via ORAL
  Filled 2017-05-11 (×2): qty 1

## 2017-05-11 MED ORDER — METOCLOPRAMIDE HCL 5 MG/ML IJ SOLN: 5.0000 mg | Freq: Three times a day (TID) | INTRAMUSCULAR | Status: DC | PRN

## 2017-05-11 MED ORDER — MIDAZOLAM HCL 5 MG/5ML IJ SOLN
INTRAMUSCULAR | Status: DC | PRN
  Administered 2017-05-11 (×2): 0.5 mg via INTRAVENOUS

## 2017-05-11 MED ORDER — OXYCODONE HCL 5 MG PO TABS
5.0000 mg | ORAL_TABLET | ORAL | Status: DC | PRN
  Administered 2017-05-11: 5 mg via ORAL
  Filled 2017-05-11: qty 1

## 2017-05-11 MED ORDER — PROPOFOL 10 MG/ML IV BOLUS
INTRAVENOUS | Status: DC | PRN
  Administered 2017-05-11: 20 mg via INTRAVENOUS

## 2017-05-11 MED ORDER — MORPHINE SULFATE (PF) 2 MG/ML IV SOLN
2.0000 mg | INTRAVENOUS | Status: DC | PRN
  Administered 2017-05-11: 2 mg via INTRAVENOUS
  Filled 2017-05-11: qty 1

## 2017-05-11 MED ORDER — POLYETHYLENE GLYCOL 3350 17 G PO PACK: 17.0000 g | PACK | Freq: Every day | ORAL | Status: DC | PRN

## 2017-05-11 MED ORDER — PROPOFOL 500 MG/50ML IV EMUL
INTRAVENOUS | Status: DC | PRN
  Administered 2017-05-11: 25 ug/kg/min via INTRAVENOUS

## 2017-05-11 MED ORDER — SODIUM CHLORIDE 0.9 % IJ SOLN
INTRAMUSCULAR | Status: AC
  Filled 2017-05-11: qty 50

## 2017-05-11 MED ORDER — MIRABEGRON ER 50 MG PO TB24
50.0000 mg | ORAL_TABLET | Freq: Every day | ORAL | Status: DC
  Administered 2017-05-12 – 2017-05-14 (×3): 50 mg via ORAL
  Filled 2017-05-11 (×4): qty 1

## 2017-05-11 MED ORDER — FLEET ENEMA 7-19 GM/118ML RE ENEM: 1.0000 | ENEMA | Freq: Once | RECTAL | Status: DC | PRN

## 2017-05-11 MED ORDER — BUPIVACAINE-EPINEPHRINE (PF) 0.25% -1:200000 IJ SOLN
INTRAMUSCULAR | Status: DC | PRN
  Administered 2017-05-11: 10 mL via PERINEURAL

## 2017-05-11 MED ORDER — BISACODYL 10 MG RE SUPP
10.0000 mg | Freq: Every day | RECTAL | Status: DC | PRN
  Administered 2017-05-14: 10 mg via RECTAL
  Filled 2017-05-11: qty 1

## 2017-05-11 MED ORDER — MEMANTINE HCL 5 MG PO TABS
5.0000 mg | ORAL_TABLET | Freq: Two times a day (BID) | ORAL | Status: DC
  Administered 2017-05-11 – 2017-05-14 (×6): 5 mg via ORAL
  Filled 2017-05-11 (×6): qty 1

## 2017-05-11 MED ORDER — MIRTAZAPINE 15 MG PO TABS
7.5000 mg | ORAL_TABLET | Freq: Every day | ORAL | Status: DC
  Administered 2017-05-11 – 2017-05-13 (×3): 7.5 mg via ORAL
  Filled 2017-05-11 (×4): qty 1

## 2017-05-11 MED ORDER — ACETAMINOPHEN 500 MG PO TABS: 1000.0000 mg | ORAL_TABLET | Freq: Two times a day (BID) | ORAL | Status: DC

## 2017-05-11 MED ORDER — BUPIVACAINE-EPINEPHRINE (PF) 0.25% -1:200000 IJ SOLN
INTRAMUSCULAR | Status: AC
  Filled 2017-05-11: qty 30

## 2017-05-11 MED ORDER — CHLORHEXIDINE GLUCONATE 4 % EX LIQD
60.0000 mL | Freq: Once | CUTANEOUS | Status: AC
  Administered 2017-05-11: 4 via TOPICAL

## 2017-05-11 MED ORDER — ONDANSETRON HCL 4 MG PO TABS: 4.0000 mg | ORAL_TABLET | Freq: Four times a day (QID) | ORAL | Status: DC | PRN

## 2017-05-11 MED ORDER — CEFAZOLIN SODIUM-DEXTROSE 1-4 GM/50ML-% IV SOLN
1.0000 g | Freq: Four times a day (QID) | INTRAVENOUS | Status: AC
  Administered 2017-05-11 – 2017-05-12 (×3): 1 g via INTRAVENOUS
  Filled 2017-05-11 (×4): qty 50

## 2017-05-11 MED ORDER — LEVOTHYROXINE SODIUM 50 MCG PO TABS
50.0000 ug | ORAL_TABLET | Freq: Every day | ORAL | Status: DC
  Administered 2017-05-12 – 2017-05-14 (×3): 50 ug via ORAL
  Filled 2017-05-11 (×3): qty 1

## 2017-05-11 MED ORDER — LACTATED RINGERS IV SOLN
INTRAVENOUS | Status: DC
  Administered 2017-05-11: 18:00:00 via INTRAVENOUS

## 2017-05-11 MED ORDER — ACETAMINOPHEN 500 MG PO TABS: 1000.0000 mg | ORAL_TABLET | Freq: Once | ORAL | Status: DC

## 2017-05-11 MED ORDER — PROPOFOL 10 MG/ML IV BOLUS
INTRAVENOUS | Status: AC
  Filled 2017-05-11: qty 20

## 2017-05-11 MED ORDER — MIDAZOLAM HCL 2 MG/2ML IJ SOLN
INTRAMUSCULAR | Status: AC
  Filled 2017-05-11: qty 2

## 2017-05-11 MED ORDER — PRAVASTATIN SODIUM 20 MG PO TABS
80.0000 mg | ORAL_TABLET | Freq: Every day | ORAL | Status: DC
  Administered 2017-05-11 – 2017-05-13 (×3): 80 mg via ORAL
  Filled 2017-05-11 (×3): qty 4

## 2017-05-11 MED ORDER — FLUTICASONE PROPIONATE 50 MCG/ACT NA SUSP
2.0000 | Freq: Every day | NASAL | Status: DC
  Administered 2017-05-12 – 2017-05-14 (×3): 2 via NASAL
  Filled 2017-05-11: qty 16

## 2017-05-11 MED ORDER — SODIUM CHLORIDE 0.9 % IR SOLN
Status: DC | PRN
  Administered 2017-05-11: 250 mL

## 2017-05-11 MED ORDER — FUROSEMIDE 20 MG PO TABS
10.0000 mg | ORAL_TABLET | Freq: Every day | ORAL | Status: DC
  Administered 2017-05-12 – 2017-05-14 (×3): 10 mg via ORAL
  Filled 2017-05-11 (×3): qty 1

## 2017-05-11 MED ORDER — LACTATED RINGERS IV SOLN
INTRAVENOUS | Status: DC | PRN
  Administered 2017-05-11: 15:00:00 via INTRAVENOUS

## 2017-05-11 MED ORDER — FENTANYL CITRATE (PF) 100 MCG/2ML IJ SOLN
INTRAMUSCULAR | Status: DC | PRN
  Administered 2017-05-11: 25 ug via INTRAVENOUS

## 2017-05-11 MED ORDER — ACETAMINOPHEN 500 MG PO TABS
1000.0000 mg | ORAL_TABLET | Freq: Two times a day (BID) | ORAL | Status: DC
  Administered 2017-05-12 – 2017-05-14 (×4): 1000 mg via ORAL
  Filled 2017-05-11 (×4): qty 2

## 2017-05-11 MED ORDER — HYDRALAZINE HCL 20 MG/ML IJ SOLN: 10.0000 mg | Freq: Four times a day (QID) | INTRAMUSCULAR | Status: DC | PRN

## 2017-05-11 MED ORDER — DOCUSATE SODIUM 100 MG PO CAPS
100.0000 mg | ORAL_CAPSULE | Freq: Two times a day (BID) | ORAL | Status: DC | PRN
Start: 2017-05-11 — End: 2017-05-14

## 2017-05-11 MED ORDER — BACITRACIN 50000 UNITS IM SOLR
INTRAMUSCULAR | Status: AC
  Filled 2017-05-11: qty 3

## 2017-05-11 MED ORDER — DOCUSATE SODIUM 100 MG PO CAPS
100.0000 mg | ORAL_CAPSULE | Freq: Two times a day (BID) | ORAL | Status: DC
  Administered 2017-05-11 – 2017-05-14 (×6): 100 mg via ORAL
  Filled 2017-05-11 (×6): qty 1

## 2017-05-11 MED ORDER — ACETAMINOPHEN 500 MG PO TABS
1000.0000 mg | ORAL_TABLET | Freq: Four times a day (QID) | ORAL | Status: AC
  Administered 2017-05-11 – 2017-05-12 (×3): 1000 mg via ORAL
  Filled 2017-05-11 (×3): qty 2

## 2017-05-11 MED ORDER — OCUVITE-LUTEIN PO CAPS
1.0000 | ORAL_CAPSULE | Freq: Every day | ORAL | Status: DC
  Administered 2017-05-12 – 2017-05-14 (×3): 1 via ORAL
  Filled 2017-05-11 (×4): qty 1

## 2017-05-11 MED ORDER — POVIDONE-IODINE 10 % EX SWAB
2.0000 "application " | Freq: Once | CUTANEOUS | Status: AC
  Administered 2017-05-11: 2 via TOPICAL

## 2017-05-11 MED ORDER — SENNA 8.6 MG PO TABS
1.0000 | ORAL_TABLET | Freq: Two times a day (BID) | ORAL | Status: DC
  Administered 2017-05-11 – 2017-05-14 (×6): 8.6 mg via ORAL
  Filled 2017-05-11 (×6): qty 1

## 2017-05-11 MED ORDER — ONDANSETRON HCL 4 MG/2ML IJ SOLN
INTRAMUSCULAR | Status: AC
  Filled 2017-05-11: qty 2

## 2017-05-11 MED ORDER — CARBIDOPA-LEVODOPA 25-100 MG PO TABS
1.0000 | ORAL_TABLET | ORAL | Status: DC
  Administered 2017-05-11 – 2017-05-14 (×12): 1 via ORAL
  Filled 2017-05-11 (×15): qty 1

## 2017-05-11 MED ORDER — FENTANYL CITRATE (PF) 100 MCG/2ML IJ SOLN: 25.0000 ug | INTRAMUSCULAR | Status: DC | PRN

## 2017-05-11 MED ORDER — VITAMIN D 1000 UNITS PO TABS
2000.0000 [IU] | ORAL_TABLET | Freq: Every day | ORAL | Status: DC
  Administered 2017-05-12 – 2017-05-14 (×3): 2000 [IU] via ORAL
  Filled 2017-05-11 (×3): qty 2

## 2017-05-11 SURGICAL SUPPLY — 59 items
BAG DECANTER FOR FLEXI CONT (MISCELLANEOUS) ×3 IMPLANT
BANDAGE ELASTIC 6 CLIP ST LF (GAUZE/BANDAGES/DRESSINGS) ×3 IMPLANT
BLADE BOVIE TIP EXT 4 (BLADE) ×3 IMPLANT
BLADE SAW 1 (BLADE) ×3 IMPLANT
BNDG COHESIVE 6X5 TAN STRL LF (GAUZE/BANDAGES/DRESSINGS) ×3 IMPLANT
CANISTER SUCT 1200ML W/VALVE (MISCELLANEOUS) ×3 IMPLANT
CANISTER SUCT 3000ML PPV (MISCELLANEOUS) ×6 IMPLANT
CAPT HIP HEMI 2 ×3 IMPLANT
CATH FOL LEG HOLDER (MISCELLANEOUS) ×3 IMPLANT
CHLORAPREP W/TINT 26ML (MISCELLANEOUS) ×6 IMPLANT
CRADLE LAMINECT ARM (MISCELLANEOUS) ×3 IMPLANT
DRAPE INCISE IOBAN 66X60 STRL (DRAPES) ×3 IMPLANT
DRAPE SHEET LG 3/4 BI-LAMINATE (DRAPES) ×3 IMPLANT
DRAPE TABLE BACK 80X90 (DRAPES) ×3 IMPLANT
DRAPE U-SHAPE 47X51 STRL (DRAPES) ×3 IMPLANT
DRSG AQUACEL AG ADV 3.5X10 (GAUZE/BANDAGES/DRESSINGS) ×3 IMPLANT
DRSG AQUACEL AG ADV 3.5X14 (GAUZE/BANDAGES/DRESSINGS) ×3 IMPLANT
DRSG OPSITE POSTOP 4X10 (GAUZE/BANDAGES/DRESSINGS) ×3 IMPLANT
ELECT BLADE 6.5 EXT (BLADE) ×3 IMPLANT
ELECT CAUTERY BLADE 6.4 (BLADE) ×3 IMPLANT
ELECT REM PT RETURN 9FT ADLT (ELECTROSURGICAL) ×3
ELECTRODE REM PT RTRN 9FT ADLT (ELECTROSURGICAL) ×1 IMPLANT
GAUZE PACK 2X3YD (MISCELLANEOUS) ×3 IMPLANT
GAUZE PETRO XEROFOAM 1X8 (MISCELLANEOUS) ×3 IMPLANT
GAUZE XEROFORM 4X4 STRL (GAUZE/BANDAGES/DRESSINGS) ×3 IMPLANT
GLOVE INDICATOR 8.0 STRL GRN (GLOVE) ×3 IMPLANT
GLOVE SURG ORTHO 8.0 STRL STRW (GLOVE) ×6 IMPLANT
GOWN STRL REUS W/ TWL LRG LVL3 (GOWN DISPOSABLE) ×1 IMPLANT
GOWN STRL REUS W/ TWL XL LVL3 (GOWN DISPOSABLE) ×1 IMPLANT
GOWN STRL REUS W/TWL LRG LVL3 (GOWN DISPOSABLE) ×2
GOWN STRL REUS W/TWL XL LVL3 (GOWN DISPOSABLE) ×2
HEMOVAC 400CC 10FR (MISCELLANEOUS) IMPLANT
HOOD W/PEELAWAY (MISCELLANEOUS) ×6 IMPLANT
IV NS 100ML SINGLE PACK (IV SOLUTION) ×3 IMPLANT
KIT RM TURNOVER STRD PROC AR (KITS) ×3 IMPLANT
NDL SAFETY 18GX1.5 (NEEDLE) ×3 IMPLANT
NEEDLE HYPO 22GX1.5 SAFETY (NEEDLE) ×3 IMPLANT
NS IRRIG 1000ML POUR BTL (IV SOLUTION) ×3 IMPLANT
PACK HIP PROSTHESIS (MISCELLANEOUS) ×3 IMPLANT
PILLOW ABDUCTION FOAM SM (MISCELLANEOUS) ×3 IMPLANT
PILLOW ABDUCTION MEDIUM (MISCELLANEOUS) ×3 IMPLANT
PRESSURIZER CEMENT PROX FEM SM (MISCELLANEOUS) ×3 IMPLANT
PRESSURIZER FEM CANAL M (MISCELLANEOUS) ×3 IMPLANT
PULSAVAC PLUS IRRIG FAN TIP (DISPOSABLE) ×3
RETRIEVER SUT HEWSON (MISCELLANEOUS) ×3 IMPLANT
SOL .9 NS 3000ML IRR  AL (IV SOLUTION) ×2
SOL .9 NS 3000ML IRR UROMATIC (IV SOLUTION) ×1 IMPLANT
STAPLER SKIN PROX 35W (STAPLE) ×3 IMPLANT
SUT ETHIBOND #5 BRAIDED 30INL (SUTURE) ×3 IMPLANT
SUT MERSILENE 5MM BP 1 12 (SUTURE) ×6 IMPLANT
SUT QUILL PDO 2 24X24 VLT (SUTURE) ×3 IMPLANT
SUT VIC AB 2-0 CT1 18 (SUTURE) ×6 IMPLANT
SUT VIC AB 2-0 CT1 27 (SUTURE) ×2
SUT VIC AB 2-0 CT1 TAPERPNT 27 (SUTURE) ×1 IMPLANT
SYR 20CC LL (SYRINGE) ×3 IMPLANT
SYR TB 1ML LUER SLIP (SYRINGE) ×3 IMPLANT
TIP BRUSH PULSAVAC PLUS 24.33 (MISCELLANEOUS) ×3 IMPLANT
TIP FAN IRRIG PULSAVAC PLUS (DISPOSABLE) ×1 IMPLANT
TOWER CARTRIDGE SMART MIX (DISPOSABLE) ×3 IMPLANT

## 2017-05-11 NOTE — OR Nursing (Signed)
Patient incont of urine large amount gown and bed linen changed

## 2017-05-11 NOTE — OR Nursing (Signed)
Patient has an attends in place

## 2017-05-11 NOTE — ED Provider Notes (Signed)
[facility] Emergency Department Provider Note  ____________________________________________   First MD Initiated Contact with Patient 05/11/17 0503     (approximate)  I have reviewed the triage vital signs and the nursing notes.   HISTORY  Chief Complaint Fall and Hip Pain (right)   HPI Kara Moore is a 82 y.o. female who comes to the emergency department via EMS with severe right hip pain and a mild headache after mechanical trip and fall this morning. She awoke early this morning to go to the bathroom and she said her feet kicked each other and she fell down onto her right side. She has been unable to get up and unable to bear weight since. She has severe pain in her right hip worse with movement and improved with rest. She did hit her head but she did not lose consciousness. Breath abdominal pain nausea vomiting. She denies palpitations. She denies syncope.   Past Medical History:  Diagnosis Date  . Benign essential hypertension   . Confusion   . Diabetes mellitus without complication (HCC)   . Hyperlipidemia LDL goal < 100   . Hypothyroidism   . Macular degeneration   . Memory loss   . Senile osteoporosis     Patient Active Problem List   Diagnosis Date Noted  . Mixed incontinence urge and stress 12/12/2016  . Primary osteoarthritis of both knees 07/21/2014  . Essential hypertension, benign 07/21/2014  . Other and unspecified hyperlipidemia 07/21/2014  . Osteoarthritis of right knee 01/31/2014  . Diabetes mellitus type 2, uncomplicated (HCC) 01/17/2014  . Right calf pain 01/17/2014  . Parkinson's disease (tremor, stiffness, slow motion, unstable posture) (HCC) 10/19/2013  . Memory loss   . Hypothyroidism   . Macular degeneration   . Senile osteoporosis   . Hyperlipidemia   . Recurrent falls 06/17/2013    Past Surgical History:  Procedure Laterality Date  . ADENOIDECTOMY  2011    Prior to Admission medications   Medication Sig  Start Date End Date Taking? Authorizing Provider  acetaminophen (TYLENOL) 500 MG tablet Take 1,000 mg by mouth 2 (two) times daily.   Yes [provider]  alum & mag hydroxide-simeth (MINTOX) 200-200-20 MG/5ML suspension Take 30 mLs by mouth every 6 (six) hours as needed for indigestion or heartburn.   Yes [provider]  aspirin 81 MG chewable tablet Chew 81 mg by mouth daily.    Yes [provider]  calcium carbonate (OS-CAL - DOSED IN MG OF ELEMENTAL CALCIUM) 1250 (500 CA) MG tablet Take 1 tablet by mouth daily.    Yes [provider]  carbidopa-levodopa (SINEMET IR) 25-100 MG tablet Take 1 tablet by mouth 4 (four) times daily. Take at 8, 12, 4 PM, 8 PM. 09/12/16  Yes Huston FoleyAthar, Saima, MD  Cholecalciferol 2000 UNITS CAPS Take 1 capsule (2,000 Units total) by mouth daily. 10/28/14  Yes Reed, Tiffany L, DO  fluticasone (FLONASE) 50 MCG/ACT nasal spray Place 2 sprays into the nose as needed.    Yes [provider]  furosemide (LASIX) 20 MG tablet Take 1/2 tablet orally every day   Yes [provider]  glipiZIDE (GLUCOTROL) 5 MG tablet Take 5 mg by mouth daily before breakfast. For Diabetes (elevated blood sugar) 10/18/13  Yes Reed, Tiffany L, DO  guaifenesin (ROBITUSSIN) 100 MG/5ML syrup Take 200 mg by mouth 4 (four) times daily as needed for cough.   Yes [provider]  levothyroxine (SYNTHROID, LEVOTHROID) 50 MCG tablet TAKE 1  TABLET BY MOUTH DAILY BEFORE BREAKFAST ON EMPTY STOMACH *BINGO CARD* 11/03/14  Yes Sharon Seller, NP  loperamide (IMODIUM) 2 MG capsule Take 2 mg by mouth. Take one tablet orally with each loose stool as need for diarrhea * NTE 8 doses in 24 hours*   Yes [provider]  magnesium hydroxide (MILK OF MAGNESIA) 400 MG/5ML suspension Take by mouth daily as needed for constipation.   Yes [provider]  memantine (NAMENDA) 5 MG tablet Take 1 tablet (5 mg total) by mouth 2 (two) times daily. 12/12/16   Yes Reed, Tiffany L, DO  metFORMIN (GLUCOPHAGE) 500 MG tablet Take 1 tablet (500 mg total) by mouth 2 (two) times daily with a meal. 01/30/15  Yes Reed, Tiffany L, DO  mirabegron ER (MYRBETRIQ) 50 MG TB24 tablet Take 50 mg by mouth daily.   Yes [provider]  mirtazapine (REMERON) 7.5 MG tablet Take 1 tablet (7.5 mg total) by mouth at bedtime. 02/05/16  Yes Reed, Tiffany L, DO  Multiple Vitamins-Minerals (ICAPS MV) TABS Take 2 tablets by mouth daily.    Yes [provider]  neomycin-bacitracin-polymyxin (NEOSPORIN) [phone] ointment Apply 1 application topically 4 (four) times daily.   Yes [provider]  potassium chloride (K-DUR,KLOR-CON) 10 MEQ tablet Take 10 mEq by mouth daily. Take to prevent potassium loss (due to taking fluid pill)   Yes [provider]  pravastatin (PRAVACHOL) 80 MG tablet Take 1 tablet (80 mg total) by mouth daily. For Cholesterol 02/05/16  Yes Reed, Tiffany L, DO  ranitidine (ZANTAC) 150 MG capsule Take 150 mg by mouth at bedtime.    Yes [provider]  Skin Protectants, Misc. (DIMETHICONE-ZINC OXIDE) cream Apply topically 3 (three) times daily.   Yes [provider]  traMADol (ULTRAM) 50 MG tablet Take one tablet by mouth twice daily for pain; Take one tablet by mouth at bedtime for pain 11/27/16  Yes Kirt Boys, DO    Allergies Patient has no known allergies.  Family History  Problem Relation Age of Onset  . Heart attack Father   . Cancer Sister   . Heart attack Brother   . Diabetes Brother   . Tremor Maternal Grandmother   . Diabetes Paternal Grandfather   . Tremor Maternal Aunt   . Diabetes Paternal Grandmother     Social History Social History  Substance Use Topics  . Smoking status: Never Smoker  . Smokeless tobacco: Never Used  . Alcohol use No    Review of Systems Constitutional: No fever/chills Eyes: No visual changes. ENT: No sore throat. Cardiovascular: Denies chest  pain. Respiratory: Denies shortness of breath. Gastrointestinal: No abdominal pain.  No nausea, no vomiting.  No diarrhea.  No constipation. Genitourinary: Negative for dysuria. Musculoskeletal: Negative for back pain. Skin: Negative for rash. Neurological: Negative for headaches, focal weakness or numbness.   ____________________________________________   PHYSICAL EXAM:  VITAL SIGNS: ED Triage Vitals  Enc Vitals Group     BP      Pulse      Resp      Temp      Temp src      SpO2      Weight      Height      Head Circumference      Peak Flow      Pain Score      Pain Loc      Pain Edu?      Excl. in GC?  Constitutional: Alert and oriented 4 appears uncomfortable but pleasant cooperative and in no distress Eyes: PERRL EOMI. Head: Atraumatic. Nose: No congestion/rhinnorhea. Mouth/Throat: No trismus Neck: No stridor.   Cardiovascular: Normal rate, regular rhythm. Grossly normal heart sounds.  Good peripheral circulation. Respiratory: Normal respiratory effort.  No retractions. Lungs CTAB and moving good air Gastrointestinal: Soft nontender Musculoskeletal: Right leg held in external rotation and slightly shortened significant discomfort with longer on the right leg and with internal and external rotation of the right hip  Neurologic:  Normal speech and language. No gross focal neurologic deficits are appreciated. Skin:  Skin is warm, dry and intact. No rash noted. Psychiatric: Mood and affect are normal. Speech and behavior are normal.    ____________________________________________   DIFFERENTIAL includes but not limited to  Intracerebral hemorrhage, syncope, mechanical fall, hip fracture ____________________________________________   LABS (all labs ordered are listed, but only abnormal results are displayed)  Labs Reviewed  BASIC METABOLIC PANEL - Abnormal; Notable for the following:       Result Value   Glucose, Bld 161 (*)    All other components  within normal limits  HEPATIC FUNCTION PANEL  CBC WITH DIFFERENTIAL/PLATELET  PROTIME-INR    Labs unremarkable __________________________________________  EKG  ED ECG REPORT I, Merrily Brittle, the attending physician, personally viewed and interpreted this ECG.  Date: 05/11/2017 Rate: 94 Rhythm: normal sinus rhythm QRS Axis: normal Intervals: normal ST/T Wave abnormalities: normal Narrative Interpretation: unremarkable  ____________________________________________  RADIOLOGY  Hip x-ray shows displaced right sided femoral neck fracture. Head CT is negative ____________________________________________   PROCEDURES  Procedure(s) performed: no  Procedures  Critical Care performed: no  Observation: no ____________________________________________   INITIAL IMPRESSION / ASSESSMENT AND PLAN / ED COURSE  Pertinent labs & imaging results that were available during my care of the patient were reviewed by me and considered in my medical decision making (see chart for details).  The patient arrives extremely uncomfortable appearing with a mechanism concerning for hip fracture. I will give her intravenous fentanyl as well as x-rays and a CT scan.  The patient's pain is unchanged after the fentanyl. X-ray confirms a displaced fracture of the right femoral neck. I discussed the case with on-call orthopedic surgeon Dr. Odis Luster will kindly consult on the patient and I will admit her to the hospitalist now. Acquired another dose of intravenous fentanyl now.      ____________________________________________   FINAL CLINICAL IMPRESSION(S) / ED DIAGNOSES  Final diagnoses:  Closed fracture of neck of right femur, initial encounter (HCC)      NEW MEDICATIONS STARTED DURING THIS VISIT:  New Prescriptions   No medications on file     Note:  This document was prepared using Dragon voice recognition software and may include unintentional dictation errors.     Merrily Brittle, MD 05/11/17 [phone]

## 2017-05-11 NOTE — Anesthesia Preprocedure Evaluation (Signed)
Anesthesia Evaluation  Patient identified by MRN, date of birth, ID band Patient awake    Reviewed: Allergy & Precautions, NPO status , Patient's Chart, lab work & pertinent test results  History of Anesthesia Complications Negative for: history of anesthetic complications  Airway Mallampati: III  TM Distance: >3 FB Neck ROM: Full    Dental  (+) Poor Dentition   Pulmonary neg pulmonary ROS, neg sleep apnea, neg COPD,    breath sounds clear to auscultation- rhonchi (-) wheezing      Cardiovascular hypertension, Pt. on medications (-) CAD, (-) Past MI and (-) Cardiac Stents  Rhythm:Regular Rate:Normal - Systolic murmurs and - Diastolic murmurs    Neuro/Psych Parkinson's disease  negative psych ROS   GI/Hepatic negative GI ROS, Neg liver ROS,   Endo/Other  diabetes, Oral Hypoglycemic AgentsHypothyroidism   Renal/GU negative Renal ROS     Musculoskeletal  (+) Arthritis ,   Abdominal (+) - obese,   Peds  Hematology negative hematology ROS (+)   Anesthesia Other Findings Past Medical History: No date: Benign essential hypertension No date: Confusion No date: Diabetes mellitus without complication (HCC) No date: Hyperlipidemia LDL goal < 100 No date: Hypothyroidism No date: Macular degeneration No date: Memory loss No date: Senile osteoporosis   Reproductive/Obstetrics                             Anesthesia Physical Anesthesia Plan  ASA: III  Anesthesia Plan: Spinal   Post-op Pain Management:    Induction:   PONV Risk Score and Plan: 2 and Ondansetron and Propofol  Airway Management Planned: Natural Airway  Additional Equipment:   Intra-op Plan:   Post-operative Plan:   Informed Consent: I have reviewed the patients History and Physical, chart, labs and discussed the procedure including the risks, benefits and alternatives for the proposed anesthesia with the patient or  authorized representative who has indicated his/her understanding and acceptance.   Dental advisory given  Plan Discussed with: Anesthesiologist and CRNA  Anesthesia Plan Comments:         Lab Results  Component Value Date   WBC 7.9 05/11/2017   HGB 13.8 05/11/2017   HCT 39.9 05/11/2017   MCV 91.4 05/11/2017   PLT 223 05/11/2017    Anesthesia Quick Evaluation

## 2017-05-11 NOTE — ED Triage Notes (Signed)
[facility] coming from [facility] for [facility]. Pt reports she struck her head. Pt denies loc. Pt c/o right hip pain

## 2017-05-11 NOTE — Anesthesia Post-op Follow-up Note (Cosign Needed)
Anesthesia QCDR form completed.        

## 2017-05-11 NOTE — Transfer of Care (Signed)
Immediate Anesthesia Transfer of Care Note  Patient: Kara Moore  Procedure(s) Performed: Procedure(s): ARTHROPLASTY BIPOLAR HIP (HEMIARTHROPLASTY) (Right)  Patient Location: PACU  Anesthesia Type:Spinal  Level of Consciousness: sedated  Airway & Oxygen Therapy: Patient Spontanous Breathing and Patient connected to face mask oxygen  Post-op Assessment: Report given to RN and Post -op Vital signs reviewed and stable  Post vital signs: Reviewed and stable  Last Vitals:  Vitals:   05/11/17 0900 05/11/17 1016  BP: (!) 165/89 (!) 167/75  Pulse: 98 96  Resp: 20 16  Temp:  37.3 C    Last Pain:  Vitals:   05/11/17 1108  TempSrc:   PainSc: 2          Complications: No apparent anesthesia complications

## 2017-05-11 NOTE — ED Notes (Signed)
Attempted to call report to floor.  Charge nurse will call back when she is back on the floor to receive report.

## 2017-05-11 NOTE — H&P (Signed)
[facility] [place] [facility]   PATIENT NAME: Kara Moore    MR#:  [id number]  DATE OF BIRTH:  Nov 25, 1934  DATE OF ADMISSION:  05/11/2017  PRIMARY CARE PHYSICIAN: Kermit Balo, DO   REQUESTING/REFERRING PHYSICIAN: Lord  CHIEF COMPLAINT:   Chief Complaint  Patient presents with  . Fall  . Hip Pain    right    HISTORY OF PRESENT ILLNESS: Kara Moore  is a 82 y.o. female with a known history of Htn, DM, Hyperlipidemia, parkinson's- lives in [place] place- walks with walker- last night- was going to bathroom with walker and foot got tangled with something and had a fall. Found to have hip fracture. For last few weeks have urinary leaking complain.  PAST MEDICAL HISTORY:   Past Medical History:  Diagnosis Date  . Benign essential hypertension   . Confusion   . Diabetes mellitus without complication (HCC)   . Hyperlipidemia LDL goal < 100   . Hypothyroidism   . Macular degeneration   . Memory loss   . Senile osteoporosis     PAST SURGICAL HISTORY: Past Surgical History:  Procedure Laterality Date  . ADENOIDECTOMY  2011    SOCIAL HISTORY:  Social History  Substance Use Topics  . Smoking status: Never Smoker  . Smokeless tobacco: Never Used  . Alcohol use No    FAMILY HISTORY:  Family History  Problem Relation Age of Onset  . Heart attack Father   . Cancer Sister   . Heart attack Brother   . Diabetes Brother   . Tremor Maternal Grandmother   . Diabetes Paternal Grandfather   . Tremor Maternal Aunt   . Diabetes Paternal Grandmother     DRUG ALLERGIES: No Known Allergies  REVIEW OF SYSTEMS:   CONSTITUTIONAL: No fever, fatigue or weakness.  EYES: No blurred or double vision.  EARS, NOSE, AND THROAT: No tinnitus or ear pain.  RESPIRATORY: No cough, shortness of breath, wheezing or hemoptysis.  CARDIOVASCULAR: No chest pain, orthopnea, edema.  GASTROINTESTINAL: No nausea, vomiting, diarrhea or abdominal pain.  GENITOURINARY: No  dysuria, hematuria.  ENDOCRINE: No polyuria, nocturia,  HEMATOLOGY: No anemia, easy bruising or bleeding SKIN: No rash or lesion. MUSCULOSKELETAL: No joint pain or arthritis.   NEUROLOGIC: No tingling, numbness, weakness.  PSYCHIATRY: No anxiety or depression.   MEDICATIONS AT HOME:  Prior to Admission medications   Medication Sig Start Date End Date Taking? Authorizing Provider  acetaminophen (TYLENOL) 500 MG tablet Take 1,000 mg by mouth 2 (two) times daily.   Yes [provider]  alum & mag hydroxide-simeth (MINTOX) 200-200-20 MG/5ML suspension Take 30 mLs by mouth every 6 (six) hours as needed for indigestion or heartburn.   Yes [provider]  aspirin 81 MG chewable tablet Chew 81 mg by mouth daily.    Yes [provider]  calcium carbonate (OS-CAL - DOSED IN MG OF ELEMENTAL CALCIUM) 1250 (500 CA) MG tablet Take 1 tablet by mouth daily.    Yes [provider]  carbidopa-levodopa (SINEMET IR) 25-100 MG tablet Take 1 tablet by mouth 4 (four) times daily. Take at 8, 12, 4 PM, 8 PM. 09/12/16  Yes Huston Foley, MD  Cholecalciferol 2000 UNITS CAPS Take 1 capsule (2,000 Units total) by mouth daily. 10/28/14  Yes Reed, Tiffany L, DO  fluticasone (FLONASE) 50 MCG/ACT nasal spray Place 2 sprays into the nose as needed.    Yes [provider]  furosemide (LASIX) 20 MG tablet Take 1/2  tablet orally every day   Yes [provider]  glipiZIDE (GLUCOTROL) 5 MG tablet Take 5 mg by mouth daily before breakfast. For Diabetes (elevated blood sugar) 10/18/13  Yes Reed, Tiffany L, DO  guaifenesin (ROBITUSSIN) 100 MG/5ML syrup Take 200 mg by mouth 4 (four) times daily as needed for cough.   Yes [provider]  levothyroxine (SYNTHROID, LEVOTHROID) 50 MCG tablet TAKE 1 TABLET BY MOUTH DAILY BEFORE BREAKFAST ON EMPTY STOMACH *BINGO CARD* 11/03/14  Yes Sharon Seller, NP  loperamide (IMODIUM) 2 MG capsule Take 2 mg by mouth. Take one tablet orally  with each loose stool as need for diarrhea * NTE 8 doses in 24 hours*   Yes [provider]  magnesium hydroxide (MILK OF MAGNESIA) 400 MG/5ML suspension Take by mouth daily as needed for constipation.   Yes [provider]  memantine (NAMENDA) 5 MG tablet Take 1 tablet (5 mg total) by mouth 2 (two) times daily. 12/12/16  Yes Reed, Tiffany L, DO  metFORMIN (GLUCOPHAGE) 500 MG tablet Take 1 tablet (500 mg total) by mouth 2 (two) times daily with a meal. 01/30/15  Yes Reed, Tiffany L, DO  mirabegron ER (MYRBETRIQ) 50 MG TB24 tablet Take 50 mg by mouth daily.   Yes [provider]  mirtazapine (REMERON) 7.5 MG tablet Take 1 tablet (7.5 mg total) by mouth at bedtime. 02/05/16  Yes Reed, Tiffany L, DO  Multiple Vitamins-Minerals (ICAPS MV) TABS Take 2 tablets by mouth daily.    Yes [provider]  neomycin-bacitracin-polymyxin (NEOSPORIN) [phone] ointment Apply 1 application topically 4 (four) times daily.   Yes [provider]  potassium chloride (K-DUR,KLOR-CON) 10 MEQ tablet Take 10 mEq by mouth daily. Take to prevent potassium loss (due to taking fluid pill)   Yes [provider]  pravastatin (PRAVACHOL) 80 MG tablet Take 1 tablet (80 mg total) by mouth daily. For Cholesterol 02/05/16  Yes Reed, Tiffany L, DO  ranitidine (ZANTAC) 150 MG capsule Take 150 mg by mouth at bedtime.    Yes [provider]  Skin Protectants, Misc. (DIMETHICONE-ZINC OXIDE) cream Apply topically 3 (three) times daily.   Yes [provider]  traMADol (ULTRAM) 50 MG tablet Take one tablet by mouth twice daily for pain; Take one tablet by mouth at bedtime for pain 11/27/16  Yes Kirt Boys, DO      PHYSICAL EXAMINATION:   VITAL SIGNS: Blood pressure (!) 167/86, pulse 90, temperature 98.9 F (37.2 C), temperature source Oral, resp. rate 20, height 5\' 3"  (1.6 m), weight 68 kg (150 lb), SpO2 93 %.  GENERAL:  82 y.o.-year-old patient lying in the bed with  no acute distress.  EYES: Pupils equal, round, reactive to light and accommodation. No scleral icterus. Extraocular muscles intact.  HEENT: Head atraumatic, normocephalic. Oropharynx and nasopharynx clear.  NECK:  Supple, no jugular venous distention. No thyroid enlargement, no tenderness.  LUNGS: Normal breath sounds bilaterally, no wheezing, rales,rhonchi or crepitation. No use of accessory muscles of respiration.  CARDIOVASCULAR: S1, S2 normal. No murmurs, rubs, or gallops.  ABDOMEN: Soft, nontender, nondistended. Bowel sounds present. No organomegaly or mass.  EXTREMITIES: No pedal edema, cyanosis, or clubbing. Tender in right hip. Distal pulses and sensations preserved on both lower extremities. NEUROLOGIC: Cranial nerves II through XII are intact. Muscle strength 5/5 in all extremities- except right lower limb, not moving much due to pain. Sensation intact. Gait not checked.  PSYCHIATRIC: The patient is alert and oriented x 3.  SKIN:  No obvious rash, lesion, or ulcer.   LABORATORY PANEL:   CBC  Recent Labs Lab 05/11/17 0516  WBC 7.9  HGB 13.8  HCT 39.9  PLT 223  MCV 91.4  MCH 31.7  MCHC 34.7  RDW 13.7  LYMPHSABS 1.3  MONOABS 0.5  EOSABS 0.1  BASOSABS 0.0   ------------------------------------------------------------------------------------------------------------------  Chemistries   Recent Labs Lab 05/11/17 0516  NA 138  K 4.2  CL 103  CO2 27  GLUCOSE 161*  BUN 14  CREATININE 0.72  CALCIUM 9.4  AST 19  ALT 15  ALKPHOS 76  BILITOT 0.7   ------------------------------------------------------------------------------------------------------------------ estimated creatinine clearance is 51.9 mL/min (by C-G formula based on SCr of 0.72 mg/dL). ------------------------------------------------------------------------------------------------------------------ No results for input(s): TSH, T4TOTAL, T3FREE, THYROIDAB in the last 72 hours.  Invalid input(s):  FREET3   Coagulation profile  Recent Labs Lab 05/11/17 0516  INR 1.06   ------------------------------------------------------------------------------------------------------------------- No results for input(s): DDIMER in the last 72 hours. -------------------------------------------------------------------------------------------------------------------  Cardiac Enzymes No results for input(s): CKMB, TROPONINI, MYOGLOBIN in the last 168 hours.  Invalid input(s): CK ------------------------------------------------------------------------------------------------------------------ Invalid input(s): POCBNP  ---------------------------------------------------------------------------------------------------------------  Urinalysis    Component Value Date/Time   COLORURINE YELLOW (A) 04/04/2017 1926   APPEARANCEUR HAZY (A) 04/04/2017 1926   APPEARANCEUR Cloudy 11/12/2014 0232   LABSPEC 1.008 04/04/2017 1926   LABSPEC 1.017 11/12/2014 0232   PHURINE 7.0 04/04/2017 1926   GLUCOSEU NEGATIVE 04/04/2017 1926   GLUCOSEU Negative 11/12/2014 0232   HGBUR NEGATIVE 04/04/2017 1926   BILIRUBINUR NEGATIVE 04/04/2017 1926   BILIRUBINUR Negative 11/12/2014 0232   KETONESUR 5 (A) 04/04/2017 1926   PROTEINUR NEGATIVE 04/04/2017 1926   NITRITE NEGATIVE 04/04/2017 1926   LEUKOCYTESUR NEGATIVE 04/04/2017 1926   LEUKOCYTESUR Negative 11/12/2014 0232     RADIOLOGY: Ct Head Wo Contrast  Result Date: 05/11/2017 CLINICAL DATA:  Fall, head injury. No loss of consciousness. On aspirin. History of diabetes, hypertension and hyperlipidemia. EXAM: CT HEAD WITHOUT CONTRAST TECHNIQUE: Contiguous axial images were obtained from the base of the skull through the vertex without intravenous contrast. COMPARISON:  CT HEAD Apr 04, 2017 FINDINGS: BRAIN: No intraparenchymal hemorrhage, mass effect nor midline shift. The ventricles and sulci are normal for age. Patchy supratentorial white matter hypodensities  less than expected for patient's age, though non-specific are most compatible with chronic small vessel ischemic disease. No acute large vascular territory infarcts. No abnormal extra-axial fluid collections. Basal cisterns are patent. VASCULAR: Mild to mild calcific atherosclerosis of the carotid siphons. SKULL: No skull fracture. Moderate temporomandibular osteoarthrosis. No significant scalp soft tissue swelling. SINUSES/ORBITS: The mastoid air-cells and included paranasal sinuses are well-aerated.The included ocular globes and orbital contents are non-suspicious. OTHER: None. IMPRESSION: No acute intracranial process. Stable examination:  Negative noncontrast CT HEAD for age. Electronically Signed   By: Awilda Metro M.D.   On: 05/11/2017 05:46   Dg Hip Unilat With Pelvis 2-3 Views Right  Result Date: 05/11/2017 CLINICAL DATA:  Right hip pain after fall. EXAM: DG HIP (WITH OR WITHOUT PELVIS) 2-3V RIGHT COMPARISON:  None. FINDINGS: Displaced right femoral neck fracture with angulation and proximal migration of the femoral shaft. Remote bilateral inferior pubic rami fractures. The bones are under mineralized. Pubic symphysis and sacroiliac joints are congruent. Normal alignment of the left hip. IMPRESSION: Displaced right femoral neck fracture with proximal migration of the femoral shaft. Electronically Signed   By: Rubye Oaks M.D.   On: 05/11/2017 06:07    EKG: Orders placed or performed during the hospital encounter of  05/11/17  . ED EKG  . ED EKG  . EKG 12-Lead  . EKG 12-Lead    IMPRESSION AND PLAN:  * Right hip fracture- accidental fall.    Medically optimized, moderate risk due to age, but can proceed with surgery as it will help restoring her ability to walk and live independently.   WIll give morphin PRN for pain.   Further management per ortho.  * Htn   Cont home emds   High current;y due to pain.  * DM   ISS, hold oral meds  * Hyperlipidemia   Cont statin  *  Hypothyroidism   Cont levothyroxine  * parkinson's dz   Cont carbi+ levo dopa   All the records are reviewed and case discussed with ED provider. Management plans discussed with the patient, family and they are in agreement.  CODE STATUS: Full. Code Status History    This patient does not have a recorded code status. Please follow your organizational policy for patients in this situation.    Advance Directive Documentation     Most Recent Value  Type of Advance Directive  Healthcare Power of Attorney, Living will  Pre-existing out of facility DNR order (yellow form or pink MOST form)  -  "MOST" Form in Place?  -       TOTAL TIME TAKING CARE OF THIS PATIENT: 50 minutes.    Altamese DillingVACHHANI, Cathren Sween M.D on 05/11/2017   Between 7am to 6pm - Pager - [phone]  After 6pm go to [email or web address] - password [email or web address]  [facility]  Office  [phone]  CC: Primary care physician; Kermit Baloeed, Tiffany L, DO   Note: This dictation was prepared with Dragon dictation along with smaller phrase technology. Any transcriptional errors that result from this process are unintentional.

## 2017-05-11 NOTE — Anesthesia Procedure Notes (Signed)
Spinal  Patient location during procedure: OR Start time: 05/11/2017 2:35 PM End time: 05/11/2017 2:50 PM Staffing Anesthesiologist: Randa Lynn Ry Moody Performed: anesthesiologist  Preanesthetic Checklist Completed: patient identified, site marked, surgical consent, pre-op evaluation, timeout performed, IV checked, risks and benefits discussed and monitors and equipment checked Spinal Block Patient position: sitting Prep: ChloraPrep Patient monitoring: heart rate, continuous pulse ox, blood pressure and cardiac monitor Approach: midline Location: L4-5 Injection technique: single-shot Needle Needle type: Introducer and Pencil-Tip  Needle gauge: 24 G Needle length: 9 cm Additional Notes Negative paresthesia. Negative blood return. Positive free-flowing CSF. Expiration date of kit checked and confirmed. Patient tolerated procedure well, without complications.

## 2017-05-11 NOTE — Op Note (Signed)
05/11/2017  1:47 PM  PATIENT:  Kara Moore   MRN: [id number]  PRE-OPERATIVE DIAGNOSIS:  Displaced Subcapital fracture right hip   POST-OPERATIVE DIAGNOSIS: Same  PROCEDURE:  Right   hip hemiarthroplasty with Summit prosthesis  PREOPERATIVE INDICATIONS:  Kara Moore is an 82 y.o. female who was admitted 05/11/2017 with a diagnosis of displaced subcapital fracture of the hip and elected for surgical management.  The risks benefits and alternatives were discussed with the patient including but not limited to the risks of nonoperative treatment, versus surgical intervention including infection, bleeding, nerve injury, periprosthetic fracture, the need for revision surgery, dislocation, leg length discrepancy, blood clots, cardiopulmonary complications, morbidity, mortality, among others, and they were willing to proceed.  Predicted outcome is poor, there will be at least a six to nine month expected recovery with increased risk secondary to the patient's Parkinson's disease.    SURGEON:  Elyn Aquas. Harlow Mares, MD    ANESTHESIA: spinal    COMPLICATIONS:  None.   EBL:  100 cc    COMPONENTS:  Summit stem size # 4  ,   and a size   46 mm bipolar head with 28 x +1.5 mm femoral head.    PROCEDURE IN DETAIL: The patient was met in the holding area and identified.  The appropriate hip  was marked at the operative site. The patient was then transported to the OR and  placed under general anesthesia.  At that point, the patient was  placed in the lateral decubitus position with the operative side up and  secured to the operating room table and all bony prominences padded.     The operative lower extremity was prepped from the iliac crest to the toes.  Sterile draping was performed.  Time out was performed prior to incision.      A routine posterolateral approach was utilized via sharp dissection  carried down to the subcutaneous tissue.  Gross bleeders were Bovie  coagulated.  The iliotibial band was  identified and incised  along the length of the skin incision.  Self-retaining retractors were  inserted.  With the hip internally rotated, the short external rotators  were identified. The piriformis was tagged and the hip capsule released in a L-type fashion.  The femoral neck was exposed, and the femoral neck cut using the neck cutting guide.    The acetabulum was exposed and bony debris was removed.    The proximal femur was opened using the cookie-cutter, the lateralizing reamer, and then sequentially broached.  A trial stem  was  utilized along with a bipolar head and neck.  I reduced the hip and it was found to have excellent stability with functional range of motion. Leg lengths were equal.  The trial components were then removed.   The same size Summit femoral stem was then inserted and was stable.  The head and neck as trialed were inserted as well.     The hip was then reduced and taken through functional range of motion and found to have excellent stability. Leg lengths were restored.   The capsule and short external rotators were closed #5 Ethibond.  The hip was again irrigated copiously with pulse lavage, and repaired the fascia with #2 Quill and the subcutaneous layer with #0 Quill. Staples were used for the skin Sponge and needle counts were correct. Dry sterile Aquacell was applied.   The patient was then awakened and returned to PACU in stable and satisfactory condition. There were no apparent  complications.  Elyn Aquas. Harlow Mares, MD  05/11/2017 1:47 PM

## 2017-05-11 NOTE — Clinical Social Work Note (Signed)
Clinical Social Work Assessment  Patient Details  Name: Kara Kara Moore MRN: [id number] Date of Birth: 03/29/1935  Date of referral:  05/11/17               Reason for consult:  Discharge Planning                Permission sought to share information with:  Family Supports Permission granted to share information::  Yes, Verbal Permission Granted  Name::     Kara Kara Moore, Niece [phone]  and Kara Kara Moore, niece [phone]  both HPOA  Agency::     Relationship::     Contact Information:     Housing/Transportation Living arrangements for the past 2 months:  Assisted Living Facility [facility] for past 4 years Source of Information:  Other (Comment Required), Patient (Niece Kara Kara Moore) Patient Interpreter Needed:  None Criminal Activity/Legal Involvement Pertinent to Current Situation/Hospitalization:    Significant Relationships:  Other(Comment) (Niece- Kara Kara Moore and Kara Kara Moore) Lives with:  Facility Resident Do you feel safe going back to the place where you live?  Yes Need for family participation in patient care:  Yes (Comment)  Care giving concerns:  Patient nor family expressed any concerns at this time   Office managerocial Worker assessment / plan:  Patient is alert and oriented.  Niece with is also HPOA is at bedside assisting patient with information obtained in the assessment.  Patient is very pleasant. She never married has no children. Primary support system are her two nieces. She retired from Audiological scientistaccounting many years ago.   Patient states she does not get out much, outing primarily consist of going to the doctor office.  She uses a rolling walking per patient is able to ambulate without the walker.  Patient use to enjoy cooking but now she states she enjoys eating.    Employment status:  Retired Health and safety inspectornsurance information:  Armed forces operational officerMedicare, Other (Comment Required) Building services engineer(AARP) PT Recommendations:  Not assessed at this time Information / Referral to community resources:   none at this  time  Patient/Family's Response to care: Niece and patient have no concerns at this time , they provided verbal permission to fax out if STR is needed. Patient/Family's Understanding of and Emotional Response to Diagnosis, Current Treatment, and Prognosis:   Patient and niece understand patient will be admitted for continued medical work up.  [facility], states willing to go to STR if needed.  Emotional Assessment Appearance:    Attitude/Demeanor/Rapport:    Affect (typically observed):  Accepting, Calm, Pleasant Orientation:  Oriented to Self, Oriented to Place, Oriented to  Time, Oriented to Situation Alcohol / Substance use:    Psych involvement (Current and /or in the community):     Discharge Needs  Concerns to be addressed:  Other (Comment Required (Discharge planning) Readmission within the last 30 days:    Current discharge risk:  Dependent with Mobility, Chronically ill Barriers to Discharge:  Continued Medical Work up   [employer] ServiceMaster CompanyMoore, Kara Dlouhy H, LCSW 05/11/2017, 9:44 AM

## 2017-05-11 NOTE — Progress Notes (Signed)
Family Meeting Note  Advance Directive:yes  Today a meeting took place with the Patient and her neice.  The following clinical team members were present during this meeting:MD  The following were discussed:Patient's diagnosis: hip fracture, Htn, parkinson's, DM, Patient's progosis: Unable to determine and Goals for treatment: Full Code  Additional follow-up to be provided: ortho  Time spent during discussion:20 minutes  Kara Moore, Heath GoldVAIBHAVKUMAR, MD

## 2017-05-11 NOTE — ED Notes (Signed)
Pt repositioned

## 2017-05-11 NOTE — Consult Note (Signed)
ORTHOPAEDIC CONSULTATION  REQUESTING PHYSICIAN: Altamese DillingVachhani, Vaibhavkumar, *  Chief Complaint: right hip pain  HPI: Kara Moore is a 82 y.o. female who complains of  Right hip pain after a mechanical fall at assisted living. She ambulates normally with a walker. Please see ED note for details.  Past Medical History:  Diagnosis Date  . Benign essential hypertension   . Confusion   . Diabetes mellitus without complication (HCC)   . Hyperlipidemia LDL goal < 100   . Hypothyroidism   . Macular degeneration   . Memory loss   . Senile osteoporosis    Past Surgical History:  Procedure Laterality Date  . ADENOIDECTOMY  2011   Social History   Social History  . Marital status: Single    Spouse name: N/A  . Number of children: N/A  . Years of education: N/A   Social History Main Topics  . Smoking status: Never Smoker  . Smokeless tobacco: Never Used  . Alcohol use No  . Drug use: No  . Sexual activity: No   Other Topics Concern  . None   Social History Narrative  . None   Family History  Problem Relation Age of Onset  . Heart attack Father   . Cancer Sister   . Heart attack Brother   . Diabetes Brother   . Tremor Maternal Grandmother   . Diabetes Paternal Grandfather   . Tremor Maternal Aunt   . Diabetes Paternal Grandmother    No Known Allergies Prior to Admission medications   Medication Sig Start Date End Date Taking? Authorizing Provider  acetaminophen (TYLENOL) 500 MG tablet Take 1,000 mg by mouth 2 (two) times daily.   Yes [provider]  alum & mag hydroxide-simeth (MINTOX) 200-200-20 MG/5ML suspension Take 30 mLs by mouth every 6 (six) hours as needed for indigestion or heartburn.   Yes [provider]  aspirin 81 MG chewable tablet Chew 81 mg by mouth daily.    Yes [provider]  calcium carbonate (OS-CAL - DOSED IN MG OF ELEMENTAL CALCIUM) 1250 (500 CA) MG tablet Take 1 tablet by mouth daily.    Yes [provider]  carbidopa-levodopa (SINEMET IR) 25-100 MG tablet Take 1 tablet by mouth 4 (four) times daily. Take at 8, 12, 4 PM, 8 PM. 09/12/16  Yes Huston FoleyAthar, Saima, MD  Cholecalciferol 2000 UNITS CAPS Take 1 capsule (2,000 Units total) by mouth daily. 10/28/14  Yes Reed, Tiffany L, DO  fluticasone (FLONASE) 50 MCG/ACT nasal spray Place 2 sprays into the nose as needed.    Yes [provider]  furosemide (LASIX) 20 MG tablet Take 1/2 tablet orally every day   Yes [provider]  glipiZIDE (GLUCOTROL) 5 MG tablet Take 5 mg by mouth daily before breakfast. For Diabetes (elevated blood sugar) 10/18/13  Yes Reed, Tiffany L, DO  guaifenesin (ROBITUSSIN) 100 MG/5ML syrup Take 200 mg by mouth 4 (four) times daily as needed for cough.   Yes [provider]  levothyroxine (SYNTHROID, LEVOTHROID) 50 MCG tablet TAKE 1 TABLET BY MOUTH DAILY BEFORE BREAKFAST ON EMPTY STOMACH *BINGO CARD* 11/03/14  Yes Sharon SellerEubanks, Jessica K, NP  loperamide (IMODIUM) 2 MG capsule Take 2 mg by mouth. Take one tablet orally with each loose stool as need for diarrhea * NTE 8 doses in 24 hours*   Yes [provider]  magnesium hydroxide (MILK OF MAGNESIA) 400 MG/5ML suspension Take by mouth daily as needed for constipation.   Yes [provider]  memantine (NAMENDA) 5 MG tablet Take 1 tablet (5 mg total) by mouth 2 (two) times daily. 12/12/16  Yes Reed, Tiffany L, DO  metFORMIN (GLUCOPHAGE) 500 MG tablet Take 1 tablet (500 mg total) by mouth 2 (two) times daily with a meal. 01/30/15  Yes Reed, Tiffany L, DO  mirabegron ER (MYRBETRIQ) 50 MG TB24 tablet Take 50 mg by mouth daily.   Yes [provider]  mirtazapine (REMERON) 7.5 MG tablet Take 1 tablet (7.5 mg total) by mouth at bedtime. 02/05/16  Yes Reed, Tiffany L, DO  Multiple Vitamins-Minerals (ICAPS MV) TABS Take 2 tablets by mouth daily.    Yes [provider]  neomycin-bacitracin-polymyxin (NEOSPORIN) [phone] ointment Apply 1  application topically 4 (four) times daily.   Yes [provider]  potassium chloride (K-DUR,KLOR-CON) 10 MEQ tablet Take 10 mEq by mouth daily. Take to prevent potassium loss (due to taking fluid pill)   Yes [provider]  pravastatin (PRAVACHOL) 80 MG tablet Take 1 tablet (80 mg total) by mouth daily. For Cholesterol 02/05/16  Yes Reed, Tiffany L, DO  ranitidine (ZANTAC) 150 MG capsule Take 150 mg by mouth at bedtime.    Yes [provider]  Skin Protectants, Misc. (DIMETHICONE-ZINC OXIDE) cream Apply topically 3 (three) times daily.   Yes [provider]  traMADol (ULTRAM) 50 MG tablet Take one tablet by mouth twice daily for pain; Take one tablet by mouth at bedtime for pain 11/27/16  Yes Kirt Boys, DO   Ct Head Wo Contrast  Result Date: 05/11/2017 CLINICAL DATA:  Fall, head injury. No loss of consciousness. On aspirin. History of diabetes, hypertension and hyperlipidemia. EXAM: CT HEAD WITHOUT CONTRAST TECHNIQUE: Contiguous axial images were obtained from the base of the skull through the vertex without intravenous contrast. COMPARISON:  CT HEAD Apr 04, 2017 FINDINGS: BRAIN: No intraparenchymal hemorrhage, mass effect nor midline shift. The ventricles and sulci are normal for age. Patchy supratentorial white matter hypodensities less than expected for patient's age, though non-specific are most compatible with chronic small vessel ischemic disease. No acute large vascular territory infarcts. No abnormal extra-axial fluid collections. Basal cisterns are patent. VASCULAR: Mild to mild calcific atherosclerosis of the carotid siphons. SKULL: No skull fracture. Moderate temporomandibular osteoarthrosis. No significant scalp soft tissue swelling. SINUSES/ORBITS: The mastoid air-cells and included paranasal sinuses are well-aerated.The included ocular globes and orbital contents are non-suspicious. OTHER: None. IMPRESSION: No acute intracranial process. Stable  examination:  Negative noncontrast CT HEAD for age. Electronically Signed   By: Awilda Metro M.D.   On: 05/11/2017 05:46   Dg Hip Unilat With Pelvis 2-3 Views Right  Result Date: 05/11/2017 CLINICAL DATA:  Right hip pain after fall. EXAM: DG HIP (WITH OR WITHOUT PELVIS) 2-3V RIGHT COMPARISON:  None. FINDINGS: Displaced right femoral neck fracture with angulation and proximal migration of the femoral shaft. Remote bilateral inferior pubic rami fractures. The bones are under mineralized. Pubic symphysis and sacroiliac joints are congruent. Normal alignment of the left hip. IMPRESSION: Displaced right femoral neck fracture with proximal migration of the femoral shaft. Electronically Signed   By: Rubye Oaks M.D.   On: 05/11/2017 06:07    Positive ROS: All other systems have been reviewed and were otherwise negative with the exception of those mentioned in the HPI and as above.  Physical Exam: General: Alert, no acute distress Cardiovascular: No pedal edema Respiratory: No cyanosis, no use of accessory musculature GI: No organomegaly, abdomen is soft and non-tender Skin: No lesions in  the area of chief complaint Neurologic: Sensation intact distally Psychiatric: Patient is competent for consent with normal mood and affect Lymphatic: No axillary or cervical lymphadenopathy  MUSCULOSKELETAL: right lower extremity shortened and externally rotated, she has significant adduction contractures of both hips, limited active motion at the ankle but she is able to DF/PF/EHL, pain with IR/ER of the right hip, left hip is non-tender and IR/ER is painless. She is also flexed at the neck and thoracic spine.  Assessment: Right hip femoral neck fracture, closed, displaced Parkinson's disease  Plan: The diagnosis, risks, benefits and alternatives to treatment are all discussed in detail with the patient and family. Risks include but are not limited to bleeding, infection, DVT, PE, nerve or vascular  injury, loosening, repeat operation, persistent pain, weakness, stiffness and death. She is at significant risk for dislocation. She understands and is eager to proceed.  Plan a right hip hemiarthroplasty.  Lyndle Herrlich, MD    05/11/2017 8:48 AM

## 2017-05-12 ENCOUNTER — Encounter: Payer: Self-pay | Admitting: Orthopedic Surgery

## 2017-05-12 LAB — COMPREHENSIVE METABOLIC PANEL
ALK PHOS: 52 U/L (ref 38–126)
AST: 24 U/L (ref 15–41)
Albumin: 2.7 g/dL — ABNORMAL LOW (ref 3.5–5.0)
Anion gap: 5 (ref 5–15)
BILIRUBIN TOTAL: 0.7 mg/dL (ref 0.3–1.2)
BUN: 11 mg/dL (ref 6–20)
CALCIUM: 8.5 mg/dL — AB (ref 8.9–10.3)
CO2: 25 mmol/L (ref 22–32)
Chloride: 105 mmol/L (ref 101–111)
Creatinine, Ser: 0.58 mg/dL (ref 0.44–1.00)
Glucose, Bld: 180 mg/dL — ABNORMAL HIGH (ref 65–99)
Potassium: 3.8 mmol/L (ref 3.5–5.1)
Sodium: 135 mmol/L (ref 135–145)
Total Protein: 5.2 g/dL — ABNORMAL LOW (ref 6.5–8.1)

## 2017-05-12 LAB — CBC
HEMATOCRIT: 33.8 % — AB (ref 35.0–47.0)
HEMOGLOBIN: 11.7 g/dL — AB (ref 12.0–16.0)
MCH: 31.6 pg (ref 26.0–34.0)
MCHC: 34.6 g/dL (ref 32.0–36.0)
MCV: 91.3 fL (ref 80.0–100.0)
Platelets: 189 10*3/uL (ref 150–440)
RBC: 3.7 MIL/uL — ABNORMAL LOW (ref 3.80–5.20)
RDW: 13.8 % (ref 11.5–14.5)
WBC: 8.9 10*3/uL (ref 3.6–11.0)

## 2017-05-12 LAB — GLUCOSE, CAPILLARY
GLUCOSE-CAPILLARY: 161 mg/dL — AB (ref 65–99)
GLUCOSE-CAPILLARY: 133 mg/dL — AB (ref 65–99)
GLUCOSE-CAPILLARY: 139 mg/dL — AB (ref 65–99)
Glucose-Capillary: 196 mg/dL — ABNORMAL HIGH (ref 65–99)

## 2017-05-12 MED ORDER — SODIUM CHLORIDE 0.9 % IV SOLN: INTRAVENOUS | Status: DC

## 2017-05-12 MED ORDER — ENOXAPARIN SODIUM 40 MG/0.4ML ~~LOC~~ SOLN
40.0000 mg | SUBCUTANEOUS | Status: DC
  Administered 2017-05-12 – 2017-05-13 (×2): 40 mg via SUBCUTANEOUS
  Filled 2017-05-12 (×2): qty 0.4

## 2017-05-12 MED ORDER — FAMOTIDINE 20 MG PO TABS
20.0000 mg | ORAL_TABLET | Freq: Every day | ORAL | Status: DC
  Administered 2017-05-12 – 2017-05-13 (×2): 20 mg via ORAL
  Filled 2017-05-12 (×2): qty 1

## 2017-05-12 MED ORDER — OXYCODONE HCL 5 MG PO TABS
5.0000 mg | ORAL_TABLET | ORAL | Status: DC | PRN
  Administered 2017-05-12 – 2017-05-13 (×2): 5 mg via ORAL
  Filled 2017-05-12 (×2): qty 1

## 2017-05-12 NOTE — Progress Notes (Signed)
Subjective:  Patient reports pain as moderate.  She is very sleepy this morning.   Objective:   VITALS:   Vitals:   05/11/17 2204 05/11/17 2314 05/12/17 0421 05/12/17 0720  BP:  130/62 134/69 (!) 114/50  Pulse: (!) 106 100 94 85  Resp:  (!) 22 20 15   Temp:  100.3 F (37.9 C) 98.8 F (37.1 C) 100.1 F (37.8 C)  TempSrc:  Oral Oral Oral  SpO2: 93% 97% 94% 93%  Weight:      Height:        PHYSICAL EXAM:  ABD soft Sensation intact distally Intact pulses distally Dorsiflexion/Plantar flexion intact Incision: dressing C/D/I  LABS  Results for orders placed or performed during the hospital encounter of 05/11/17 (from the past 24 hour(s))  Glucose, capillary     Status: Abnormal   Collection Time: 05/11/17 10:28 AM  Result Value Ref Range   Glucose-Capillary 180 (H) 65 - 99 mg/dL   Comment 1 Notify RN   Urinalysis, Complete w Microscopic     Status: Abnormal   Collection Time: 05/11/17 10:29 AM  Result Value Ref Range   Color, Urine YELLOW (A) YELLOW   APPearance CLOUDY (A) CLEAR   Specific Gravity, Urine 1.014 1.005 - 1.030   pH 8.0 5.0 - 8.0   Glucose, UA 50 (A) NEGATIVE mg/dL   Hgb urine dipstick NEGATIVE NEGATIVE   Bilirubin Urine NEGATIVE NEGATIVE   Ketones, ur 20 (A) NEGATIVE mg/dL   Protein, ur NEGATIVE NEGATIVE mg/dL   Nitrite NEGATIVE NEGATIVE   Leukocytes, UA NEGATIVE NEGATIVE   RBC / HPF 0-5 0 - 5 RBC/hpf   WBC, UA NONE SEEN 0 - 5 WBC/hpf   Bacteria, UA RARE (A) NONE SEEN   Squamous Epithelial / LPF NONE SEEN NONE SEEN   Budding Yeast PRESENT    Amorphous Crystal PRESENT   MRSA PCR Screening     Status: None   Collection Time: 05/11/17 10:29 AM  Result Value Ref Range   MRSA by PCR NEGATIVE NEGATIVE  Glucose, capillary     Status: Abnormal   Collection Time: 05/11/17  6:06 PM  Result Value Ref Range   Glucose-Capillary 142 (H) 65 - 99 mg/dL  Glucose, capillary     Status: Abnormal   Collection Time: 05/11/17  9:06 PM  Result Value Ref Range   Glucose-Capillary 176 (H) 65 - 99 mg/dL  CBC     Status: Abnormal   Collection Time: 05/12/17  4:09 AM  Result Value Ref Range   WBC 8.9 3.6 - 11.0 K/uL   RBC 3.70 (L) 3.80 - 5.20 MIL/uL   Hemoglobin 11.7 (L) 12.0 - 16.0 g/dL   HCT 16.133.8 (L) 09.635.0 - 04.547.0 %   MCV 91.3 80.0 - 100.0 fL   MCH 31.6 26.0 - 34.0 pg   MCHC 34.6 32.0 - 36.0 g/dL   RDW 40.913.8 81.111.5 - 91.414.5 %   Platelets 189 150 - 440 K/uL  Comprehensive metabolic panel     Status: Abnormal   Collection Time: 05/12/17  4:09 AM  Result Value Ref Range   Sodium 135 135 - 145 mmol/L   Potassium 3.8 3.5 - 5.1 mmol/L   Chloride 105 101 - 111 mmol/L   CO2 25 22 - 32 mmol/L   Glucose, Bld 180 (H) 65 - 99 mg/dL   BUN 11 6 - 20 mg/dL   Creatinine, Ser 7.820.58 0.44 - 1.00 mg/dL   Calcium 8.5 (L) 8.9 - 10.3 mg/dL   Total  Protein 5.2 (L) 6.5 - 8.1 g/dL   Albumin 2.7 (L) 3.5 - 5.0 g/dL   AST 24 15 - 41 U/L   ALT <5 (L) 14 - 54 U/L   Alkaline Phosphatase 52 38 - 126 U/L   Total Bilirubin 0.7 0.3 - 1.2 mg/dL   GFR calc non Af Amer >60 >60 mL/min   GFR calc Af Amer >60 >60 mL/min   Anion gap 5 5 - 15  Glucose, capillary     Status: Abnormal   Collection Time: 05/12/17  7:27 AM  Result Value Ref Range   Glucose-Capillary 161 (H) 65 - 99 mg/dL    Ct Head Wo Contrast  Result Date: 05/11/2017 CLINICAL DATA:  Fall, head injury. No loss of consciousness. On aspirin. History of diabetes, hypertension and hyperlipidemia. EXAM: CT HEAD WITHOUT CONTRAST TECHNIQUE: Contiguous axial images were obtained from the base of the skull through the vertex without intravenous contrast. COMPARISON:  CT HEAD Apr 04, 2017 FINDINGS: BRAIN: No intraparenchymal hemorrhage, mass effect nor midline shift. The ventricles and sulci are normal for age. Patchy supratentorial white matter hypodensities less than expected for patient's age, though non-specific are most compatible with chronic small vessel ischemic disease. No acute large vascular territory infarcts. No abnormal  extra-axial fluid collections. Basal cisterns are patent. VASCULAR: Mild to mild calcific atherosclerosis of the carotid siphons. SKULL: No skull fracture. Moderate temporomandibular osteoarthrosis. No significant scalp soft tissue swelling. SINUSES/ORBITS: The mastoid air-cells and included paranasal sinuses are well-aerated.The included ocular globes and orbital contents are non-suspicious. OTHER: None. IMPRESSION: No acute intracranial process. Stable examination:  Negative noncontrast CT HEAD for age. Electronically Signed   By: Awilda Metro M.D.   On: 05/11/2017 05:46   Dg Pelvis Portable  Result Date: 05/11/2017 CLINICAL DATA:  Status post right hip revision surgery. EXAM: PORTABLE PELVIS 1-2 VIEWS COMPARISON:  06/12/2013 FINDINGS: Knee right hip hemiarthroplasty appears well-seated and well aligned on this single AP view. There is no acute fracture or evidence of an operative complication. There old healed pubic rami fractures. IMPRESSION: 1. Well-positioned right hip arthroplasty. Electronically Signed   By: Amie Portland M.D.   On: 05/11/2017 17:37   Dg Hip Unilat With Pelvis 2-3 Views Right  Result Date: 05/11/2017 CLINICAL DATA:  Right hip pain after fall. EXAM: DG HIP (WITH OR WITHOUT PELVIS) 2-3V RIGHT COMPARISON:  None. FINDINGS: Displaced right femoral neck fracture with angulation and proximal migration of the femoral shaft. Remote bilateral inferior pubic rami fractures. The bones are under mineralized. Pubic symphysis and sacroiliac joints are congruent. Normal alignment of the left hip. IMPRESSION: Displaced right femoral neck fracture with proximal migration of the femoral shaft. Electronically Signed   By: Rubye Oaks M.D.   On: 05/11/2017 06:07    Assessment/Plan: 1 Day Post-Op   Principal Problem:   Hip fracture, unspecified laterality, closed, initial encounter [facility]) Active Problems:   Hip fracture (HCC)   Advance diet Up with therapy Discharge to SNF when  medically stable. Plan for [facility].  Weight bearing as tolerated and posterior hip precautions. Abduction pillow when in bed.   Lyndle Herrlich , MD 05/12/2017, 8:08 AM

## 2017-05-12 NOTE — Care Management (Signed)
This RNCM received [facility] from [facility] regarding notification that this patient may be a Medicare Bundle case. I have confirmed this with Rasheda and advised that she contact CSW as PT is recommending SNF. Per Dr. Maxcine HamBower's he prefers patient go to [facility] if she has to go to SNF for rehab. Arlana HoveRasheda has reached out to both CSW and [facility] for assistance.

## 2017-05-12 NOTE — Progress Notes (Signed)
[place] met with pt, pt states she broke her R hip, and had surgery that she said went well. Pt was not in much pain this morning and was enjoying her cup of coffee. Pt told [place] her nephew and niece are coming in from out of town to see her. Pt asked [place] to pray for her and other pt's in hospital. [place] prayed for pt, her family, and offered a ministry of presence.   05/12/17 1000  Clinical Encounter Type  Visited With Patient  Visit Type Initial;Spiritual support  Referral From Nurse  Consult/Referral To Chaplain  Spiritual Encounters  Spiritual Needs Prayer

## 2017-05-12 NOTE — Care Management (Signed)
[facility]. PT pending. Surgery 06/24 for right hip fracture. Plan is SNF??

## 2017-05-12 NOTE — Progress Notes (Signed)
[facility] [place] [facility]   PATIENT NAME: Kara Moore    MR#:  [id number]  DATE OF BIRTH:  06/16/1935  SUBJECTIVE:  CHIEF COMPLAINT:   Chief Complaint  Patient presents with  . Fall  . Hip Pain    right    came with accidental fall and hip fracture.   S/p surgery, no new complains today.  REVIEW OF SYSTEMS:  CONSTITUTIONAL: No fever, fatigue or weakness.  EYES: No blurred or double vision.  EARS, NOSE, AND THROAT: No tinnitus or ear pain.  RESPIRATORY: No cough, shortness of breath, wheezing or hemoptysis.  CARDIOVASCULAR: No chest pain, orthopnea, edema.  GASTROINTESTINAL: No nausea, vomiting, diarrhea or abdominal pain.  GENITOURINARY: No dysuria, hematuria.  ENDOCRINE: No polyuria, nocturia,  HEMATOLOGY: No anemia, easy bruising or bleeding SKIN: No rash or lesion. MUSCULOSKELETAL: No joint pain or arthritis.   NEUROLOGIC: No tingling, numbness, weakness.  PSYCHIATRY: No anxiety or depression.   ROS  DRUG ALLERGIES:  No Known Allergies  VITALS:  Blood pressure (!) 108/59, pulse 84, temperature 99 F (37.2 C), temperature source Oral, resp. rate 16, height 5\' 3"  (1.6 m), weight 68 kg (150 lb), SpO2 95 %.  PHYSICAL EXAMINATION:  GENERAL:  82 y.o.-year-old patient lying in the bed with no acute distress.  EYES: Pupils equal, round, reactive to light and accommodation. No scleral icterus. Extraocular muscles intact.  HEENT: Head atraumatic, normocephalic. Oropharynx and nasopharynx clear.  NECK:  Supple, no jugular venous distention. No thyroid enlargement, no tenderness.  LUNGS: Normal breath sounds bilaterally, no wheezing, rales,rhonchi or crepitation. No use of accessory muscles of respiration.  CARDIOVASCULAR: S1, S2 normal. No murmurs, rubs, or gallops.  ABDOMEN: Soft, nontender, nondistended. Bowel sounds present. No organomegaly or mass.  EXTREMITIES: No pedal edema, cyanosis, or clubbing.  NEUROLOGIC: Cranial nerves II through XII  are intact. Muscle strength 5/5 in all extremities. Sensation intact. Gait not checked.  PSYCHIATRIC: The patient is alert and oriented x 3.  SKIN: No obvious rash, lesion, or ulcer.   Physical Exam LABORATORY PANEL:   CBC  Recent Labs Lab 05/12/17 0409  WBC 8.9  HGB 11.7*  HCT 33.8*  PLT 189   ------------------------------------------------------------------------------------------------------------------  Chemistries   Recent Labs Lab 05/12/17 0409  NA 135  K 3.8  CL 105  CO2 25  GLUCOSE 180*  BUN 11  CREATININE 0.58  CALCIUM 8.5*  AST 24  ALT <5*  ALKPHOS 52  BILITOT 0.7   ------------------------------------------------------------------------------------------------------------------  Cardiac Enzymes No results for input(s): TROPONINI in the last 168 hours. ------------------------------------------------------------------------------------------------------------------  RADIOLOGY:  Ct Head Wo Contrast  Result Date: 05/11/2017 CLINICAL DATA:  Fall, head injury. No loss of consciousness. On aspirin. History of diabetes, hypertension and hyperlipidemia. EXAM: CT HEAD WITHOUT CONTRAST TECHNIQUE: Contiguous axial images were obtained from the base of the skull through the vertex without intravenous contrast. COMPARISON:  CT HEAD Apr 04, 2017 FINDINGS: BRAIN: No intraparenchymal hemorrhage, mass effect nor midline shift. The ventricles and sulci are normal for age. Patchy supratentorial white matter hypodensities less than expected for patient's age, though non-specific are most compatible with chronic small vessel ischemic disease. No acute large vascular territory infarcts. No abnormal extra-axial fluid collections. Basal cisterns are patent. VASCULAR: Mild to mild calcific atherosclerosis of the carotid siphons. SKULL: No skull fracture. Moderate temporomandibular osteoarthrosis. No significant scalp soft tissue swelling. SINUSES/ORBITS: The mastoid air-cells and  included paranasal sinuses are well-aerated.The included ocular globes and orbital contents are non-suspicious. OTHER: None. IMPRESSION:  No acute intracranial process. Stable examination:  Negative noncontrast CT HEAD for age. Electronically Signed   By: Awilda Metro M.D.   On: 05/11/2017 05:46   Dg Pelvis Portable  Result Date: 05/11/2017 CLINICAL DATA:  Status post right hip revision surgery. EXAM: PORTABLE PELVIS 1-2 VIEWS COMPARISON:  06/12/2013 FINDINGS: Knee right hip hemiarthroplasty appears well-seated and well aligned on this single AP view. There is no acute fracture or evidence of an operative complication. There old healed pubic rami fractures. IMPRESSION: 1. Well-positioned right hip arthroplasty. Electronically Signed   By: Amie Portland M.D.   On: 05/11/2017 17:37   Dg Hip Unilat With Pelvis 2-3 Views Right  Result Date: 05/11/2017 CLINICAL DATA:  Right hip pain after fall. EXAM: DG HIP (WITH OR WITHOUT PELVIS) 2-3V RIGHT COMPARISON:  None. FINDINGS: Displaced right femoral neck fracture with angulation and proximal migration of the femoral shaft. Remote bilateral inferior pubic rami fractures. The bones are under mineralized. Pubic symphysis and sacroiliac joints are congruent. Normal alignment of the left hip. IMPRESSION: Displaced right femoral neck fracture with proximal migration of the femoral shaft. Electronically Signed   By: Rubye Oaks M.D.   On: 05/11/2017 06:07    ASSESSMENT AND PLAN:   Principal Problem:   Hip fracture, unspecified laterality, closed, initial encounter [facility]) Active Problems:   Hip fracture (HCC)   * Right hip fracture- accidental fall.   S/p surgery   give morphin PRN for pain.   Further management per ortho.   PT and rehab.  * Htn   Cont home meds- stable.  * DM   ISS, hold oral meds- stable.  * Hyperlipidemia   Cont statin  * Hypothyroidism   Cont levothyroxine  * parkinson's dz   Cont carbi+ levo dopa  All the  records are reviewed and case discussed with Care Management/Social Workerr. Management plans discussed with the patient, family and they are in agreement.  CODE STATUS: full.  TOTAL TIME TAKING CARE OF THIS PATIENT: 35 minutes.   POSSIBLE D/C IN 1-2 DAYS, DEPENDING ON CLINICAL CONDITION.   Altamese Dilling M.D on 05/12/2017   Between 7am to 6pm - Pager - [phone]  After 6pm go to [email or web address] - password [email or web address]  [facility]  Office  [phone]  CC: Primary care physician; Kermit Balo, DO  Note: This dictation was prepared with Dragon dictation along with smaller phrase technology. Any transcriptional errors that result from this process are unintentional.

## 2017-05-12 NOTE — Plan of Care (Signed)
Problem: Bowel/Gastric: Goal: Will not experience complications related to bowel motility Outcome: Progressing Pt is making poor progress toward goals.

## 2017-05-12 NOTE — Evaluation (Signed)
Physical Therapy Evaluation Patient Details Name: Kara Moore MRN: [id number] DOB: 1934-11-28 Today's Date: 05/12/2017   History of Present Illness  presented to ER status post mechanical fall while going to the bathroom; admitted with subcapital fracture of R hip, status post R hip hemi-arthroplasty (05/11/17), WBAT, posterior THPs.  Clinical Impression  Upon evaluation, patient alert and oriented to basic information; follows simple commands, but requires increased time for processing/task initiation.  R LE generally weak and guarded (2-/5) due to pain; L LE with increased tone/resistance (likely due to parkinson's).  Currently requiring max assist +1-2 for all functional activities.  Heavy posterior weight shift with both sitting and standing activities; very poor balance/righting reactions.  Very high fall risk. Unsafe/unable to attempt true gait beyond bed/chair due to pain, poor weight shifting and questionable 'freezing' episodes. Would benefit from skilled PT to address above deficits and promote optimal return to PLOF; recommend transition to STR upon discharge from acute hospitalization.     Follow Up Recommendations SNF    Equipment Recommendations       Recommendations for Other Services       Precautions / Restrictions Precautions Precautions: Posterior Hip;Fall Restrictions Weight Bearing Restrictions: Yes RLE Weight Bearing: Weight bearing as tolerated      Mobility  Bed Mobility Overal bed mobility: Needs Assistance Bed Mobility: Supine to Sit     Supine to sit: Max assist     General bed mobility comments: assist for LE management, UE placement and truncal elevation.  Poor task initiation, poor mobility dissociation  Transfers Overall transfer level: Needs assistance Equipment used: Rolling walker (2 wheeled) Transfers: Sit to/from Stand Sit to Stand: Mod assist;Max assist;+2 physical assistance         General transfer comment: assist for lift off,  postural extension and anterior weight translation  Ambulation/Gait Ambulation/Gait assistance: Mod assist;Max assist;+2 physical assistance Ambulation Distance (Feet): 4 Feet Assistive device: Rolling walker (2 wheeled)       General Gait Details: short shuffling steps with foward flexed, kyphotic posture.  Absent standing balance/righting reactions.  total assist for walker management in upright.  Very high fall risk; requiring +2 at all times.  Stairs            Wheelchair Mobility    Modified Rankin (Stroke Patients Only)       Balance Overall balance assessment: Needs assistance Sitting-balance support: No upper extremity supported;Feet supported Sitting balance-Leahy Scale: Fair   Postural control: Posterior lean Standing balance support: Bilateral upper extremity supported Standing balance-Leahy Scale: Zero                               Pertinent Vitals/Pain Pain Assessment: 0-10 Pain Score: 5  Pain Location: R hip Pain Descriptors / Indicators: Aching;Grimacing;Operative site guarding Pain Intervention(s): Limited activity within patient's tolerance;Monitored during session;Repositioned    Home Living Family/patient expects to be discharged to:: Assisted living               Home Equipment: Walker - 2 wheels;Walker - 4 wheels Additional Comments: Resident of [facility] x4 years; single story with no steps required.    Prior Function Level of Independence: Independent with assistive device(s)         Comments: Mod indep for ADLs, household and limited community distances (to/from dining hall).  Utilizes [id number] within facility, 2WRW outside.  Does endorse 'occasional' fall prior to admission.     Hand Dominance  Extremity/Trunk Assessment        Lower Extremity Assessment Lower Extremity Assessment: Generalized weakness (R LE grossly 2+ to 3-/5, limited by pain; L LE grossly 3-/5.  Increased tone/rigidity L LE  (2/4), likely due to parkinsons (patient unable to describe impact of parkinson's at baseline))       Communication   Communication: No difficulties  Cognition Arousal/Alertness: Awake/alert Behavior During Therapy: WFL for tasks assessed/performed Overall Cognitive Status: Difficult to assess                                 General Comments: delayed processing and task initiation      General Comments      Exercises Other Exercises Other Exercises: Supine LE therex, 1x10, act assist ROM: ankle pumps, heel slides, hip abduct/adduct (within THPs).  Poor task initiation for all isolated therex; decreased response time apparent   Assessment/Plan    PT Assessment Patient needs continued PT services  PT Problem List Decreased strength;Decreased range of motion;Decreased activity tolerance;Decreased balance;Decreased mobility;Decreased coordination;Decreased cognition;Decreased knowledge of precautions;Decreased safety awareness;Decreased knowledge of use of DME;Pain;Decreased skin integrity       PT Treatment Interventions DME instruction;Gait training;Stair training;Functional mobility training;Therapeutic activities;Therapeutic exercise;Balance training;Patient/family education    PT Goals (Current goals can be found in the Care Plan section)  Acute Rehab PT Goals Patient Stated Goal: to try and see what I can do PT Goal Formulation: With patient Time For Goal Achievement: 05/26/17 Potential to Achieve Goals: Fair    Frequency BID   Barriers to discharge Decreased caregiver support      Co-evaluation               AM-PAC PT "6 Clicks" Daily Activity  Outcome Measure Difficulty turning over in bed (including adjusting bedclothes, sheets and blankets)?: Total Difficulty moving from lying on back to sitting on the side of the bed? : Total Difficulty sitting down on and standing up from a chair with arms (e.g., wheelchair, bedside commode, etc,.)?:  Total Help needed moving to and from a bed to chair (including a wheelchair)?: Total Help needed walking in hospital room?: Total Help needed climbing 3-5 steps with a railing? : Total 6 Click Score: 6    End of Session Equipment Utilized During Treatment: Gait belt Activity Tolerance: Patient tolerated treatment well Patient left: in chair;with call bell/phone within reach;with chair alarm set Nurse Communication: Mobility status PT Visit Diagnosis: Muscle weakness (generalized) (M62.81);Difficulty in walking, not elsewhere classified (R26.2);Pain Pain - Right/Left: Right Pain - part of body: Hip    Time: [id number] PT Time Calculation (min) (ACUTE ONLY): 31 min   Charges:   PT Evaluation $PT Eval Moderate Complexity: 1 Procedure PT Treatments $Therapeutic Exercise: 8-22 mins   PT G Codes:        Baylea Milburn H. Manson PasseyBrown, PT, DPT, NCS 05/12/17, 9:44 AM [phone]

## 2017-05-12 NOTE — Progress Notes (Signed)
Pt had not voided since approx 0630, pt insistent this after noon that she did not need to void. Pt was bladder scanned by this writer at approx 1800 after pt was asked to make an effort to void and stated she could not, did not feel the need to void.pt was bladder scanned for 311mls, this writer paged hospitalist Dr. Juliene PinaMody and reported the situation, received order to hanf ns at 3250mls/hr due to pt's poor po intake this shift, and order to in and out cath pt. This was accomplished for 300mls clear amber urine, and ns hung per order. Pt has take little to no initiative today toward po intake despite family members and staff encouraging her to do so. Pt reported releif after in and out cath was accomplished, but continued to state that she had not felt the need to void earlier. Family member at bedside stated that pt has become increasingly dependent on staff at assisted living and has also not been drinking enough there as she has been in to the Er and had some iv fluid and then had mentation return to normal. Report given to anessa, RN, care of pt is released.

## 2017-05-12 NOTE — Clinical Social Work Placement (Signed)
   CLINICAL SOCIAL WORK PLACEMENT  NOTE  Date:  05/12/2017  Patient Details  Name: Kara Moore MRN: [id number] Date of Birth: 05/02/1935  Clinical Social Work is seeking post-discharge placement for this patient at the Skilled  Nursing Facility level of care (*CSW will initial, date and re-position this form in  chart as items are completed):  Yes   Patient/family provided with [facility] Clinical Social Work Department's list of facilities offering this level of care within the geographic area requested by the patient (or if unable, by the patient's family).  Yes   Patient/family informed of their freedom to choose among providers that offer the needed level of care, that participate in Medicare, Medicaid or managed care program needed by the patient, have an available bed and are willing to accept the patient.  Yes   Patient/family informed of [facility] ownership interest in [facility], as well as of the fact that they are under no obligation to receive care at these facilities.  PASRR submitted to EDS on 05/12/17     PASRR number received on 05/12/17 ([id number]  )     Existing PASRR number confirmed on       FL2 transmitted to all facilities in geographic area requested by pt/family on 05/12/17     FL2 transmitted to all facilities within larger geographic area on       Patient informed that his/her managed care company has contracts with or will negotiate with certain facilities, including the following:        Yes   Patient/family informed of bed offers received.  Patient chooses bed at  [facility] )     Physician recommends and patient chooses bed at      Patient to be transferred to   on  .  Patient to be transferred to facility by       Patient family notified on   of transfer.  Name of family member notified:        PHYSICIAN       Additional Comment:    _______________________________________________ Tyshawna Alarid, [name],  LCSW 05/12/2017, 6:24 PM

## 2017-05-12 NOTE — Progress Notes (Signed)
Clinical Child psychotherapistocial Worker (CSW) contacted patient's niece [name] and presented SNF bed offers. Niece [facility]. [facility] is aware of accepted bed offer. Medicare bundle case manager has been notified. CSW will continue to follow and assist as needed.   Baker Hughes IncorporatedBailey Evadean Sproule, LCSW [phone]

## 2017-05-12 NOTE — Anesthesia Postprocedure Evaluation (Signed)
Anesthesia Post Note  Patient: Kara Moore  Procedure(s) Performed: Procedure(s) (LRB): ARTHROPLASTY BIPOLAR HIP (HEMIARTHROPLASTY) (Right)  Patient location during evaluation: Nursing Unit Anesthesia Type: Spinal Level of consciousness: oriented and awake and alert Pain management: pain level controlled Vital Signs Assessment: post-procedure vital signs reviewed and stable Respiratory status: spontaneous breathing and respiratory function stable Cardiovascular status: blood pressure returned to baseline and stable Postop Assessment: no headache and no backache Anesthetic complications: no     Last Vitals:  Vitals:   05/12/17 0421 05/12/17 0720  BP: 134/69 (!) 114/50  Pulse: 94 85  Resp: 20 15  Temp: 37.1 C 37.8 C    Last Pain:  Vitals:   05/12/17 0720  TempSrc: Oral  PainSc:                  Jules SchickLogan,  Dave Mannes P

## 2017-05-12 NOTE — Progress Notes (Signed)
Physical Therapy Treatment Patient Details Name: Kara Moore MRN: [id number] DOB: 03/21/1935 Today's Date: 05/12/2017    History of Present Illness presented to ER status post mechanical fall while going to the bathroom; admitted with subcapital fracture of R hip, status post R hip hemi-arthroplasty (05/11/17), WBAT, posterior THPs.    PT Comments    Notably fatigued this PM, requiring increased assist for all functional activities (max/total assist +2).  Very poor balance, poor ability to initiate/advance LEs (doing so only in reaction to passive weight shift by therapist). Anticipate WC level as primary mobility upon discharge at this time.   Follow Up Recommendations  SNF     Equipment Recommendations  Rolling walker with 5" wheels    Recommendations for Other Services       Precautions / Restrictions Precautions Precautions: Posterior Hip;Fall Restrictions Weight Bearing Restrictions: Yes RLE Weight Bearing: Weight bearing as tolerated    Mobility  Bed Mobility Overal bed mobility: Needs Assistance Bed Mobility: Sit to Supine     Supine to sit: Total assist;+2 for physical assistance     General bed mobility comments: total assist for upper and lower body management  Transfers Overall transfer level: Needs assistance Equipment used: Rolling walker (2 wheeled) Transfers: Sit to/from Stand Sit to Stand: +2 physical assistance;Max assist         General transfer comment: extensive assist for lift off, standing balance  Ambulation/Gait Ambulation/Gait assistance: Max assist Ambulation Distance (Feet): 4 Feet Assistive device: Rolling walker (2 wheeled)       General Gait Details: poor ability initiate stepping pattern, often advancing LEs only in reaction to passive weight shift from therapist   Stairs            Wheelchair Mobility    Modified Rankin (Stroke Patients Only)       Balance Overall balance assessment: Needs  assistance Sitting-balance support: No upper extremity supported;Feet supported Sitting balance-Leahy Scale: Poor     Standing balance support: Bilateral upper extremity supported Standing balance-Leahy Scale: Zero                              Cognition Arousal/Alertness: Lethargic Behavior During Therapy: WFL for tasks assessed/performed Overall Cognitive Status: Difficult to assess                                 General Comments: delayed processing and task initiation, very groggy, difficult to keep eyes open, occasional verbal cues needed to attend to session      Exercises Other Exercises Other Exercises: pt educated in bladder mgt strategies such as BSC beside bed overnight to minimize falls risk and maximize confidence and independence with bladder mgt over night    General Comments        Pertinent Vitals/Pain Pain Assessment: Faces Pain Score: 4  Faces Pain Scale: Hurts even more Pain Location: R hip Pain Descriptors / Indicators: Aching Pain Intervention(s): Limited activity within patient's tolerance;Monitored during session;Repositioned    Home Living Family/patient expects to be discharged to:: Assisted living             Home Equipment: Walker - 2 wheels;Walker - 4 wheels;Toilet riser Additional Comments: Resident of [facility] x4 years; single story with no steps required, comfort height toilet w/ bilat hand rails    Prior Function Level of Independence: Needs assistance  Gait / Transfers Assistance  Needed: household and limited community distances (to/from [employer]), Utilizes [phone] within facility, 2WRW outside.  ADL's / Homemaking Assistance Needed: niece indicates increasing assistance required provided by ALF staff for LB dressing and bathing tasks (ALF raised the question of needing to increase charges for additional assistance being provided), niece reports herself to be primary caregiver, also indicates pt is not  good with staying hydrated Comments: Does endorse 'occasional' fall prior to admission. Niece reports 4-5 falls in past 12 months (3 in past 2 months)   PT Goals (current goals can now be found in the care plan section) Acute Rehab PT Goals Patient Stated Goal: get better PT Goal Formulation: With patient Time For Goal Achievement: 05/26/17 Potential to Achieve Goals: Fair Progress towards PT goals: Progressing toward goals    Frequency    BID      PT Plan Current plan remains appropriate    Co-evaluation              AM-PAC PT "6 Clicks" Daily Activity  Outcome Measure  Difficulty turning over in bed (including adjusting bedclothes, sheets and blankets)?: Total Difficulty moving from lying on back to sitting on the side of the bed? : Total Difficulty sitting down on and standing up from a chair with arms (e.g., wheelchair, bedside commode, etc,.)?: Total Help needed moving to and from a bed to chair (including a wheelchair)?: Total Help needed walking in hospital room?: Total Help needed climbing 3-5 steps with a railing? : Total 6 Click Score: 6    End of Session Equipment Utilized During Treatment: Gait belt Activity Tolerance: Patient limited by fatigue Patient left: in bed;with call bell/phone within reach;with bed alarm set Nurse Communication: Mobility status PT Visit Diagnosis: Muscle weakness (generalized) (M62.81);Difficulty in walking, not elsewhere classified (R26.2);Pain Pain - Right/Left: Right Pain - part of body: Hip     Time: 1401-1420 PT Time Calculation (min) (ACUTE ONLY): 19 min  Charges:  $Therapeutic Activity: 8-22 mins                    G Codes:       Ashana Tullo H. Manson Passey, PT, DPT, NCS 05/12/17, 2:33 PM [phone]

## 2017-05-12 NOTE — Progress Notes (Signed)
OT Cancellation Note  Patient Details Name: Kara Moore MRN: [id number] DOB: 05/18/1935   Cancelled Treatment:    Reason Eval/Treat Not Completed: Other (comment). Order received, chart reviewed. Upon entry, pt with nursing and family members. Pt fatigued after PT session. 11 minutes spent speaking with family member (pt's niece and caregiver for patient, per niece's report) regarding pt's PLOF and assist required from ALF staff for basic self care and falls history. MD in room to assess. Will re-attempt completion of OT evaluation at later time as pt is available.   Richrd PrimeJamie Stiller, MPH, MS, OTR/L ascom [phone] 05/12/17, 11:57 AM

## 2017-05-12 NOTE — Progress Notes (Signed)
Only alert to self. Pt makes statements that do not make sense. Pt is very tired but will open her eyes and speak for short periods. VSS. Can wiggle toes and has full sensation to extremities bilat.    Md called and notified of post urination bladder scan of 926 ml. In and out cath output 850 ml. Pt has voided a small amount since and bladder scan this AM shows 241 ml. Lungs clear. Will continue to monitor.

## 2017-05-12 NOTE — Progress Notes (Addendum)
Shift assessment completed.Pt is alert and oriented, flat affect and stated that she chooses not to open her eyes, gives appropriate answers to questions. Pt is flushed, temp is 100.1 but room temperature is 90 degrees and this is adjusted.tPt is on room air, respirations are shallow and unlabored, pt is drowsy. tp deneid pain. Hr is regular, abdomen is soft, bs hypoactive. Pt has external catheter in place, ppp, adductor foam also in place. Pt has dressing intact to r hip. Pt is able to move her toes, no edema to bilat legs, and scd's intact. Piv#22 intact to lac and r hand, both sites are free of redness and swelling. Since assessment, pt has worked with physical therapy, is encouraged to eat, and has accepted po fluids when they are offered, continues to keep her eyes closed for staff.

## 2017-05-12 NOTE — Evaluation (Signed)
Occupational Therapy Evaluation Patient Details Name: Kara Moore MRN: [id number] DOB: 1935-07-05 Today's Date: 05/12/2017    History of Present Illness presented to ER status post mechanical fall while going to the bathroom; admitted with subcapital fracture of R hip, status post R hip hemi-arthroplasty (05/11/17), WBAT, posterior THPs.   Clinical Impression   Pt seen for OT evaluation this date, limited somewhat by lethargy/fatigue, likely due in part to medications. Pt asleep in recliner upon entry into room, easily wakes to OT's voice, difficult to keep eyes open and attend during session, requiring verbal cues to remain alert. RN notified at end of session. Increased time needed to process and initiate tasks. Deferred functional mobility at this time due to safety/pt's decreased alertness. Pt oriented to self, date, situation, place. Pt educated in bladder mgt strategies to support safety for overnight toileting needs. Pt seems open to idea of BSC beside bed overnight to minimize need to walk from bedroom to bathroom during the night. Pt endorses "ocassional fall PTA", and niece reports at least 3 just since May. Niece reports increasing need for physical assist at ALF for bathing and dressing tasks, to the point where the ALF contacted the niece regarding additional charges on the next bill for the additional assist needed recently. Niece reports she is primarily the pt's caregiver (not physically since being at ALF) and niece questions whether some of the assist is actually needed versus pt "is used to being pampered." Niece wondering if additional conversation needs to be had regarding more permanent transition to higher level of care. Additionally, niece reports pt is not good about drinking water and is often dehydrated. Pt presents with pain, decreased ROM/strength in RLE, generalized weakness in UEs bilaterally, decreased activity tolerance and knowledge of precautions and AE/DME for self  care tasks. Pt will benefit from skilled OT services to address noted impairments and functional deficits in order to maximize return to PLOF and minimize risk of future falls/injury/rehospitalization/increased caregiver burden, including education/training in AE for LB ADL, additional review of posterior hip precautions education to support recall and carry over, falls prevention strategies, and functional mobility training. Recommend transition to STR following hospitalization.     Follow Up Recommendations  SNF    Equipment Recommendations  3 in 1 bedside commode    Recommendations for Other Services       Precautions / Restrictions Precautions Precautions: Posterior Hip;Fall Restrictions Weight Bearing Restrictions: Yes RLE Weight Bearing: Weight bearing as tolerated      Mobility Bed Mobility               General bed mobility comments: deferred d/t up in recliner for session  Transfers                 General transfer comment: deferred d/t pt arousal/fatigue    Balance Overall balance assessment: Needs assistance Sitting-balance support: No upper extremity supported;Feet supported Sitting balance-Leahy Scale: Fair                                     ADL either performed or assessed with clinical judgement   ADL Overall ADL's : Needs assistance/impaired Eating/Feeding: Sitting;Set up   Grooming: Sitting;Set up   Upper Body Bathing: Moderate assistance;Bed level   Lower Body Bathing: Maximal assistance;Bed level   Upper Body Dressing : Set up;Sitting;Minimal assistance   Lower Body Dressing: Maximal assistance;Bed level     Toilet  Transfer Details (indicate cue type and reason): deferred due to safety/pt fatigue/lethargy           General ADL Comments: pt generally at max assist for LB ADL     Vision Baseline Vision/History: Wears glasses;Macular Degeneration (mac gen noted in chart) Wears Glasses: Reading only Patient  Visual Report: No change from baseline Vision Assessment?: No apparent visual deficits     Perception     Praxis      Pertinent Vitals/Pain Pain Assessment: 0-10 Pain Score: 4  Pain Location: R hip Pain Descriptors / Indicators: Aching Pain Intervention(s): Limited activity within patient's tolerance;Monitored during session;Premedicated before session     Hand Dominance Right   Extremity/Trunk Assessment Upper Extremity Assessment Upper Extremity Assessment: Generalized weakness (grossly 4-/5 bilaterally, ROM WFL)   Lower Extremity Assessment Lower Extremity Assessment: Defer to PT evaluation;Generalized weakness       Communication Communication Communication: No difficulties   Cognition Arousal/Alertness: Suspect due to medications;Lethargic Behavior During Therapy: WFL for tasks assessed/performed Overall Cognitive Status: Difficult to assess                                 General Comments: delayed processing and task initiation, very groggy, difficult to keep eyes open, occasional verbal cues needed to attend to session   General Comments       Exercises Other Exercises Other Exercises: pt educated in bladder mgt strategies such as BSC beside bed overnight to minimize falls risk and maximize confidence and independence with bladder mgt over night   Shoulder Instructions      Home Living Family/patient expects to be discharged to:: Assisted living                             Home Equipment: Walker - 2 wheels;Walker - 4 wheels;Toilet riser   Additional Comments: Resident of [facility] x4 years; single story with no steps required, comfort height toilet w/ bilat hand rails      Prior Functioning/Environment Level of Independence: Needs assistance  Gait / Transfers Assistance Needed: household and limited community distances (to/from [employer]), Utilizes [phone] within facility, [place] outside.  ADL's / Homemaking Assistance  Needed: niece indicates increasing assistance required provided by ALF staff for LB dressing and bathing tasks (ALF raised the question of needing to increase charges for additional assistance being provided), niece reports herself to be primary caregiver, also indicates pt is not good with staying hydrated   Comments: Does endorse 'occasional' fall prior to admission. Niece reports 4-5 falls in past 12 months (3 in past 2 months)        OT Problem List: Decreased strength;Pain;Decreased activity tolerance;Impaired balance (sitting and/or standing);Decreased knowledge of use of DME or AE;Decreased knowledge of precautions;Decreased range of motion      OT Treatment/Interventions: Self-care/ADL training;Therapeutic exercise;Therapeutic activities;Energy conservation;DME and/or AE instruction;Patient/family education    OT Goals(Current goals can be found in the care plan section) Acute Rehab OT Goals Patient Stated Goal: get better OT Goal Formulation: With patient Time For Goal Achievement: 05/26/17 Potential to Achieve Goals: Good  OT Frequency: Min 1X/week   Barriers to D/C:            Co-evaluation              AM-PAC PT "6 Clicks" Daily Activity     Outcome Measure Help from another person eating meals?:  None Help from another person taking care of personal grooming?: None Help from another person toileting, which includes using toliet, bedpan, or urinal?: A Lot Help from another person bathing (including washing, rinsing, drying)?: A Lot Help from another person to put on and taking off regular upper body clothing?: A Little Help from another person to put on and taking off regular lower body clothing?: A Lot 6 Click Score: 17   End of Session    Activity Tolerance: Patient limited by lethargy;Patient limited by fatigue (suspect due to meds) Patient left: in chair;with call bell/phone within reach;with chair alarm set;with family/visitor present;Other (comment)  (pillows in place between legs)  OT Visit Diagnosis: Other abnormalities of gait and mobility (R26.89);Repeated falls (R29.6);Muscle weakness (generalized) (M62.81);Pain Pain - Right/Left: Right Pain - part of body: Hip                Time: 6553-7482 OT Time Calculation (min): 25 min Charges:  OT General Charges $OT Visit: 1 Procedure OT Evaluation $OT Eval Low Complexity: 1 Procedure OT Treatments $Self Care/Home Management : 8-22 mins G-Codes:     Jeni Salles, MPH, MS, OTR/L ascom [phone] 05/12/17, 2:04 PM

## 2017-05-12 NOTE — NC FL2 (Signed)
[place] MEDICAID FL2 LEVEL OF CARE SCREENING TOOL     IDENTIFICATION  Patient Name: Kara Moore Birthdate: 11-17-1935 Sex: female Admission Date (Current Location): 05/11/2017  [place] and [place] Number:  Chiropodist and Address:  [facility], [address], [place], [place] [address]      Provider Number: [id number]  Attending Physician Name and Address:  Altamese Dilling, *  Relative Name and Phone Number:       Current Level of Care: Hospital Recommended Level of Care: Skilled Nursing Facility Prior Approval Number:    Date Approved/Denied:   PASRR Number:    Discharge Plan: SNF    Current Diagnoses: Patient Active Problem List   Diagnosis Date Noted  . Hip fracture, unspecified laterality, closed, initial encounter (HCC) 05/11/2017  . Hip fracture (HCC) 05/11/2017  . Mixed incontinence urge and stress 12/12/2016  . Primary osteoarthritis of both knees 07/21/2014  . Essential hypertension, benign 07/21/2014  . Other and unspecified hyperlipidemia 07/21/2014  . Osteoarthritis of right knee 01/31/2014  . Diabetes mellitus type 2, uncomplicated (HCC) 01/17/2014  . Right calf pain 01/17/2014  . Parkinson's disease (tremor, stiffness, slow motion, unstable posture) (HCC) 10/19/2013  . Memory loss   . Hypothyroidism   . Macular degeneration   . Senile osteoporosis   . Hyperlipidemia   . Recurrent falls 06/17/2013    Orientation RESPIRATION BLADDER Height & Weight     Self, Time, Situation, Place  Normal Incontinent Weight: 150 lb (68 kg) Height:  5\' 3"  (160 cm)  BEHAVIORAL SYMPTOMS/MOOD NEUROLOGICAL BOWEL NUTRITION STATUS   (none)  (none) Continent Diet (Regular Diet )  AMBULATORY STATUS COMMUNICATION OF NEEDS Skin   Extensive Assist Verbally Surgical wounds (Incision: Right Hip. )                       Personal Care Assistance Level of Assistance  Bathing, Feeding, Dressing Bathing Assistance: Limited  assistance Feeding assistance: Independent Dressing Assistance: Limited assistance     Functional Limitations Info  Sight, Hearing, Speech Sight Info: Adequate Hearing Info: Adequate Speech Info: Adequate    SPECIAL CARE FACTORS FREQUENCY  PT (By licensed PT), OT (By licensed OT)     PT Frequency:  (5) OT Frequency:  (5)            Contractures      Additional Factors Info  Code Status, Allergies Code Status Info:  (Full Code. ) Allergies Info:  (No Known Allergies. )           Current Medications (05/12/2017):  This is the current hospital active medication list Current Facility-Administered Medications  Medication Dose Route Frequency Provider Last Rate Last Dose  . acetaminophen (TYLENOL) tablet 1,000 mg  1,000 mg Oral Q6H Lyndle Herrlich, MD   1,000 mg at 05/12/17 0441  . acetaminophen (TYLENOL) tablet 1,000 mg  1,000 mg Oral BID Altamese Dilling, MD      . alum & mag hydroxide-simeth (MAALOX/MYLANTA) 200-200-20 MG/5ML suspension 30 mL  30 mL Oral Q6H PRN Altamese Dilling, MD      . bisacodyl (DULCOLAX) suppository 10 mg  10 mg Rectal Daily PRN Lyndle Herrlich, MD      . calcium carbonate (TUMS - dosed in mg elemental calcium) chewable tablet 600 mg of elemental calcium  3 tablet Oral Daily Altamese Dilling, MD   600 mg of elemental calcium at 05/12/17 0832  . carbidopa-levodopa (SINEMET IR) 25-100 MG per tablet immediate  release 1 tablet  1 tablet Oral 4 times per day Altamese DillingVachhani, Montreal Steidle, MD   1 tablet at 05/12/17 0724  . celecoxib (CELEBREX) capsule 200 mg  200 mg Oral Q12H Lyndle HerrlichBowers, James R, MD   200 mg at 05/12/17 0834  . cholecalciferol (VITAMIN D) tablet 2,000 Units  2,000 Units Oral Daily Altamese DillingVachhani, Wilberta Dorvil, MD   2,000 Units at 05/12/17 [id number]  . docusate sodium (COLACE) capsule 100 mg  100 mg Oral BID PRN Altamese DillingVachhani, Conn Trombetta, MD      . docusate sodium (COLACE) capsule 100 mg  100 mg Oral BID Lyndle HerrlichBowers, James R, MD   100 mg at 05/12/17 0834   . fluticasone (FLONASE) 50 MCG/ACT nasal spray 2 spray  2 spray Each Nare Daily Altamese DillingVachhani, Azariah Latendresse, MD   2 spray at 05/12/17 0836  . furosemide (LASIX) tablet 10 mg  10 mg Oral Daily Altamese DillingVachhani, Rafael Salway, MD   10 mg at 05/12/17 [id number]  . hydrALAZINE (APRESOLINE) injection 10 mg  10 mg Intravenous Q6H PRN Altamese DillingVachhani, Katryna Tschirhart, MD      . insulin aspart (novoLOG) injection 0-9 Units  0-9 Units Subcutaneous TID WC Altamese DillingVachhani, Mylie Mccurley, MD   2 Units at 05/12/17 [phone]  . lactated ringers infusion   Intravenous Continuous Lyndle HerrlichBowers, James R, MD 50 mL/hr at 05/11/17 1815    . levothyroxine (SYNTHROID, LEVOTHROID) tablet 50 mcg  50 mcg Oral QAC breakfast Altamese DillingVachhani, Kyannah Climer, MD   50 mcg at 05/12/17 0724  . magnesium hydroxide (MILK OF MAGNESIA) suspension 15 mL  15 mL Oral Daily PRN Altamese DillingVachhani, Vennesa Bastedo, MD      . memantine [facility](NAMENDA) tablet 5 mg  5 mg Oral BID Altamese DillingVachhani, Duong Haydel, MD   5 mg at 05/12/17 [id number]  . metoCLOPramide (REGLAN) tablet 5-10 mg  5-10 mg Oral Q8H PRN Lyndle HerrlichBowers, James R, MD       Or  . metoCLOPramide (REGLAN) injection 5-10 mg  5-10 mg Intravenous Q8H PRN Lyndle HerrlichBowers, James R, MD      . mirabegron ER [facility](MYRBETRIQ) tablet 50 mg  50 mg Oral Daily Altamese DillingVachhani, Esra Frankowski, MD   50 mg at 05/12/17 0835  . mirtazapine (REMERON) tablet 7.5 mg  7.5 mg Oral QHS Altamese DillingVachhani, Pier Bosher, MD   7.5 mg at 05/11/17 2200  . morphine 2 MG/ML injection 1 mg  1 mg Intravenous Q2H PRN Lyndle HerrlichBowers, James R, MD      . morphine 2 MG/ML injection 2 mg  2 mg Intravenous Q4H PRN Altamese DillingVachhani, Elijah Michaelis, MD   2 mg at 05/11/17 1038  . multivitamin-lutein (OCUVITE-LUTEIN) capsule 1 capsule  1 capsule Oral Daily Altamese DillingVachhani, Donat Humble, MD   1 capsule at 05/12/17 0834  . ondansetron (ZOFRAN) tablet 4 mg  4 mg Oral Q6H PRN Lyndle HerrlichBowers, James R, MD       Or  . ondansetron [facility](ZOFRAN) injection 4 mg  4 mg Intravenous Q6H PRN Lyndle HerrlichBowers, James R, MD      . oxyCODONE (Oxy IR/ROXICODONE) immediate release tablet 5-10 mg  5-10 mg Oral Q4H  PRN Altamese DillingVachhani, Chamberlain Steinborn, MD      . polyethylene glycol (MIRALAX / GLYCOLAX) packet 17 g  17 g Oral Daily PRN Lyndle HerrlichBowers, James R, MD      . potassium chloride (K-DUR,KLOR-CON) CR tablet 10 mEq  10 mEq Oral Daily Altamese DillingVachhani, Orrie Schubert, MD   10 mEq at 05/12/17 [id number]  . pravastatin (PRAVACHOL) tablet 80 mg  80 mg Oral QHS Altamese DillingVachhani, Ramla Hase, MD   80 mg at 05/11/17 2110  . senna (SENOKOT) tablet 8.6 mg  1  tablet Oral BID Lyndle Herrlich, MD   8.6 mg at 05/12/17 4098  . sodium phosphate (FLEET) 7-19 GM/118ML enema 1 enema  1 enema Rectal Once PRN Lyndle Herrlich, MD      . traMADol Janean Sark) tablet 50 mg  50 mg Oral Q12H PRN Altamese Dilling, MD   50 mg at 05/12/17 1191     Discharge Medications: Please see discharge summary for a list of discharge medications.  Relevant Imaging Results:  Relevant Lab Results:   Additional Information  (SSN: [id number])  Sample, Darleen Crocker, LCSW

## 2017-05-13 LAB — BASIC METABOLIC PANEL
ANION GAP: 4 — AB (ref 5–15)
BUN: 19 mg/dL (ref 6–20)
CHLORIDE: 108 mmol/L (ref 101–111)
CO2: 24 mmol/L (ref 22–32)
Calcium: 8.1 mg/dL — ABNORMAL LOW (ref 8.9–10.3)
Creatinine, Ser: 0.38 mg/dL — ABNORMAL LOW (ref 0.44–1.00)
GFR calc non Af Amer: >60 mL/min (ref >60)
GLUCOSE: 149 mg/dL — AB (ref 65–99)
POTASSIUM: 3.6 mmol/L (ref 3.5–5.1)
Sodium: 136 mmol/L (ref 135–145)

## 2017-05-13 LAB — CBC
HEMATOCRIT: 33.2 % — AB (ref 35.0–47.0)
HEMOGLOBIN: 11.5 g/dL — AB (ref 12.0–16.0)
MCH: 31.7 pg (ref 26.0–34.0)
MCHC: 34.5 g/dL (ref 32.0–36.0)
MCV: 92.1 fL (ref 80.0–100.0)
Platelets: 166 10*3/uL (ref 150–440)
RBC: 3.61 MIL/uL — AB (ref 3.80–5.20)
RDW: 13.8 % (ref 11.5–14.5)
WBC: 9.2 10*3/uL (ref 3.6–11.0)

## 2017-05-13 LAB — GLUCOSE, CAPILLARY
GLUCOSE-CAPILLARY: 139 mg/dL — AB (ref 65–99)
GLUCOSE-CAPILLARY: 136 mg/dL — AB (ref 65–99)
Glucose-Capillary: 184 mg/dL — ABNORMAL HIGH (ref 65–99)
Glucose-Capillary: 203 mg/dL — ABNORMAL HIGH (ref 65–99)
Glucose-Capillary: 201 mg/dL — ABNORMAL HIGH (ref 65–99)

## 2017-05-13 LAB — SURGICAL PATHOLOGY

## 2017-05-13 NOTE — Progress Notes (Signed)
[facility] - [place] at [facility]   PATIENT NAME: Kara Moore    MR#:  [id number]  DATE OF BIRTH:  Jun 08, 1935  SUBJECTIVE:  CHIEF COMPLAINT:   Chief Complaint  Patient presents with  . Fall  . Hip Pain    right    came with accidental fall and hip fracture.   S/p surgery, no new complains today.  REVIEW OF SYSTEMS:  CONSTITUTIONAL: No fever, fatigue or weakness.  EYES: No blurred or double vision.  EARS, NOSE, AND THROAT: No tinnitus or ear pain.  RESPIRATORY: No cough, shortness of breath, wheezing or hemoptysis.  CARDIOVASCULAR: No chest pain, orthopnea, edema.  GASTROINTESTINAL: No nausea, vomiting, diarrhea or abdominal pain.  GENITOURINARY: No dysuria, hematuria.  ENDOCRINE: No polyuria, nocturia,  HEMATOLOGY: No anemia, easy bruising or bleeding SKIN: No rash or lesion. MUSCULOSKELETAL: No joint pain or arthritis.   NEUROLOGIC: No tingling, numbness, weakness.  PSYCHIATRY: No anxiety or depression.   ROS  DRUG ALLERGIES:  No Known Allergies  VITALS:  Blood pressure 139/64, pulse 95, temperature 98.5 F (36.9 C), temperature source Oral, resp. rate 18, height 5\' 3"  (1.6 m), weight 68 kg (150 lb), SpO2 95 %.  PHYSICAL EXAMINATION:  GENERAL:  82 y.o.-year-old patient lying in the bed with no acute distress.  EYES: Pupils equal, round, reactive to light and accommodation. No scleral icterus. Extraocular muscles intact.  HEENT: Head atraumatic, normocephalic. Oropharynx and nasopharynx clear.  NECK:  Supple, no jugular venous distention. No thyroid enlargement, no tenderness.  LUNGS: Normal breath sounds bilaterally, no wheezing, rales,rhonchi or crepitation. No use of accessory muscles of respiration.  CARDIOVASCULAR: S1, S2 normal. No murmurs, rubs, or gallops.  ABDOMEN: Soft, nontender, nondistended. Bowel sounds present. No organomegaly or mass.  EXTREMITIES: No pedal edema, cyanosis, or clubbing.  NEUROLOGIC: Cranial nerves II through XII  are intact. Muscle strength 5/5 in all extremities. Sensation intact. Gait not checked.  PSYCHIATRIC: The patient is alert and oriented x 3.  SKIN: No obvious rash, lesion, or ulcer.   Physical Exam LABORATORY PANEL:   CBC  Recent Labs Lab 05/13/17 0519  WBC 9.2  HGB 11.5*  HCT 33.2*  PLT 166   ------------------------------------------------------------------------------------------------------------------  Chemistries   Recent Labs Lab 05/12/17 0409 05/13/17 0519  NA 135 136  K 3.8 3.6  CL 105 108  CO2 25 24  GLUCOSE 180* 149*  BUN 11 19  CREATININE 0.58 0.38*  CALCIUM 8.5* 8.1*  AST 24  --   ALT <5*  --   ALKPHOS 52  --   BILITOT 0.7  --    ------------------------------------------------------------------------------------------------------------------  Cardiac Enzymes No results for input(s): TROPONINI in the last 168 hours. ------------------------------------------------------------------------------------------------------------------  RADIOLOGY:  Dg Pelvis Portable  Result Date: 05/11/2017 CLINICAL DATA:  Status post right hip revision surgery. EXAM: PORTABLE PELVIS 1-2 VIEWS COMPARISON:  06/12/2013 FINDINGS: Knee right hip hemiarthroplasty appears well-seated and well aligned on this single AP view. There is no acute fracture or evidence of an operative complication. There old healed pubic rami fractures. IMPRESSION: 1. Well-positioned right hip arthroplasty. Electronically Signed   By: Amie Portland M.D.   On: 05/11/2017 17:37    ASSESSMENT AND PLAN:   Principal Problem:   Hip fracture, unspecified laterality, closed, initial encounter [facility]) Active Problems:   Hip fracture (HCC)   * Right hip fracture- accidental fall.   S/p surgery   give morphin PRN for pain.   Further management per ortho.   PT and rehab.  likely  placement tomorrow.  * Htn   Cont home meds- stable.  * DM   ISS, hold oral meds- stable.  * Hyperlipidemia   Cont  statin  * Hypothyroidism   Cont levothyroxine  * parkinson's dz   Cont carbi+ levo dopa  All the records are reviewed and case discussed with Care Management/Social Workerr. Management plans discussed with the patient, family and they are in agreement.  CODE STATUS: full.  TOTAL TIME TAKING CARE OF THIS PATIENT: 35 minutes.   POSSIBLE D/C IN 1-2 DAYS, DEPENDING ON CLINICAL CONDITION.   Altamese DillingVACHHANI, Jakiya Bookbinder M.D on 05/13/2017   Between 7am to 6pm - Pager - [phone]  After 6pm go to [email or web address] - password [email or web address]  [facility]  Office  [phone]  CC: Primary care physician; Kermit Baloeed, Tiffany L, DO  Note: This dictation was prepared with Dragon dictation along with smaller phrase technology. Any transcriptional errors that result from this process are unintentional.

## 2017-05-13 NOTE — Progress Notes (Signed)
Plan is for patient to D/C to [facility] tomorrow pending medical clearance. [facility] is aware of above. Clinical Social Worker (CSW) will continue to follow and assist as needed.   Baker Hughes IncorporatedBailey Loda Bialas, LCSW [phone]

## 2017-05-13 NOTE — Progress Notes (Signed)
Physical Therapy Treatment Patient Details Name: Kara Moore MRN: [id number] DOB: 04/21/1935 Today's Date: 05/13/2017    History of Present Illness presented to ER status post mechanical fall while going to the bathroom; admitted with subcapital fracture of R hip, status post R hip hemi-arthroplasty (05/11/17), WBAT, posterior THPs.    PT Comments    Kara Moore continues to be limited due to fatigue and poor initiation with all aspects of mobility, likely reflective of her h/o Parkinson's Disease.  Her initiation improves with visual cues to step.  Performed exercise today focused on engaging core for anterior weight shift as pt demonstrates strong posterior bias with upright activity.  She continues to require +2 total assist for bed mobility and max+2 assist for transfers. SNF remains most appropriate d/c plan at this time.    Follow Up Recommendations  SNF     Equipment Recommendations  Rolling walker with 5" wheels    Recommendations for Other Services       Precautions / Restrictions Precautions Precautions: Posterior Hip;Fall Restrictions Weight Bearing Restrictions: Yes RLE Weight Bearing: Weight bearing as tolerated    Mobility  Bed Mobility Overal bed mobility: Needs Assistance Bed Mobility: Supine to Sit     Supine to sit: +2 for physical assistance;HOB elevated;Total assist     General bed mobility comments: Total +2 assist to manage LE and to elevate trunk.  Encouraged pt to participate using railing to pull into sitting and to initiate advancement of LEs but pt unable to assist.  Use of bed pad to scoot pt to EOB.  Transfers Overall transfer level: Needs assistance Equipment used: Rolling walker (2 wheeled) Transfers: Sit to/from [employer];Stand Pivot Transfers Sit to Stand: Max assist;+2 physical assistance Stand pivot transfers: Max assist;+2 physical assistance       General transfer comment: Assist and cues to slide feet back and for proper hand  placement for improved biomechanical advantage.  Pt requires max cues and assist to shift weight anteriorly as she demontrates a posterior bias.  Max +2 assist to boost up to standing.  Pt benefits from visual cues of where to step with foot each step when stepping forward but it is extremely difficult for pt to initiate stepping when turning.  Ambulation/Gait             General Gait Details: Not safe to attempt this session as pt with significant fatigue after pivoting and at risk of falling due to posterior bias   Stairs            Wheelchair Mobility    Modified Rankin (Stroke Patients Only)       Balance Overall balance assessment: Needs assistance Sitting-balance support: Bilateral upper extremity supported;Feet supported Sitting balance-Leahy Scale: Poor Sitting balance - Comments: Requires close min guard>mod assist to maintain balance seated EOB due to posterior lean and poor activation of abdominals. Postural control: Posterior lean Standing balance support: Bilateral upper extremity supported;During functional activity Standing balance-Leahy Scale: Zero Standing balance comment: Requries +2 max assist to prevent fall.  Pt with significantly flexed posture despite max verbal and tactile cues.                            Cognition Arousal/Alertness: Lethargic Behavior During Therapy: WFL for tasks assessed/performed Overall Cognitive Status: No family/caregiver present to determine baseline cognitive functioning Area of Impairment: Orientation;Safety/judgement  Orientation Level: Disoriented to;Situation (unable to report where she had surgery)       Safety/Judgement: Decreased awareness of safety     General Comments: Freezing and delayed processing likely due to h/o Parkinson's.  Pt lethargic.      Exercises General Exercises - Lower Extremity Ankle Circles/Pumps: AROM;Both;10 reps;Supine Quad Sets:  (pt unable to  initiate and perform correctly) Long Arc Quad: AROM;Right;5 reps;Seated;Other (comment) (cues to perform to full extension) Hip ABduction/ADduction: AAROM;Right;10 reps;Supine Other Exercises Other Exercises: Seated reaching activities with BUEs to recruit abdominal musculature and encourage anterior lean.  Pt performs more easily when reaching with RUE.  Performed cross body and ipsilaterally.  x8 minutes    General Comments        Pertinent Vitals/Pain Pain Assessment: Faces Faces Pain Scale: Hurts little more ("it doesn't hurt as bad as yesterday") Pain Location: R hip Pain Descriptors / Indicators: Grimacing;Guarding;Aching Pain Intervention(s): Limited activity within patient's tolerance;Monitored during session;Repositioned    Home Living                      Prior Function            PT Goals (current goals can now be found in the care plan section) Acute Rehab PT Goals Patient Stated Goal: get stronger PT Goal Formulation: With patient Time For Goal Achievement: 05/26/17 Potential to Achieve Goals: Fair Progress towards PT goals: Not progressing toward goals - comment (due to fatigue)    Frequency    BID      PT Plan Current plan remains appropriate    Co-evaluation              AM-PAC PT "6 Clicks" Daily Activity  Outcome Measure  Difficulty turning over in bed (including adjusting bedclothes, sheets and blankets)?: Total Difficulty moving from lying on back to sitting on the side of the bed? : Total Difficulty sitting down on and standing up from a chair with arms (e.g., wheelchair, bedside commode, etc,.)?: Total Help needed moving to and from a bed to chair (including a wheelchair)?: Total Help needed walking in hospital room?: Total Help needed climbing 3-5 steps with a railing? : Total 6 Click Score: 6    End of Session Equipment Utilized During Treatment: Gait belt Activity Tolerance: Patient limited by fatigue Patient left: in  chair;with call bell/phone within reach;with chair alarm set;with family/visitor present Nurse Communication: Mobility status;Precautions;Weight bearing status PT Visit Diagnosis: Muscle weakness (generalized) (M62.81);Difficulty in walking, not elsewhere classified (R26.2);Pain Pain - Right/Left: Right Pain - part of body: Hip     Time: 1610-9604 PT Time Calculation (min) (ACUTE ONLY): 45 min  Charges:  $Therapeutic Exercise: 8-22 mins $Therapeutic Activity: 23-37 mins                    G Codes:       Encarnacion Chu PT, DPT 05/13/2017, 9:58 AM

## 2017-05-13 NOTE — Progress Notes (Signed)
Patient with urinary retension. Alerted Vachhani, new order to in and cath now, then q 8 hr. Cathed with 350cc returned measured and brief saturated. Educated patient that holding urine may cause serious infection. Patient continues to be tense. Abduct pillow in place.

## 2017-05-13 NOTE — Progress Notes (Signed)
Subjective:  Patient reports pain as mild.  No other complaints  Objective:   VITALS:   Vitals:   05/12/17 1206 05/12/17 1700 05/12/17 2318 05/13/17 0725  BP: (!) 108/59 130/60 (!) 121/50 139/64  Pulse: 84 75 86 95  Resp: 16 18 20 18   Temp: 99 F (37.2 C) 99.1 F (37.3 C) 99.2 F (37.3 C) 98.5 F (36.9 C)  TempSrc: Oral Oral Oral Oral  SpO2: 95% 95% 97% 95%  Weight:      Height:        PHYSICAL EXAM:  Neurovascular intact Incision: dressing C/D/I  LABS  Results for orders placed or performed during the hospital encounter of 05/11/17 (from the past 24 hour(s))  Glucose, capillary     Status: Abnormal   Collection Time: 05/12/17  4:39 PM  Result Value Ref Range   Glucose-Capillary 196 (H) 65 - 99 mg/dL  Glucose, capillary     Status: Abnormal   Collection Time: 05/12/17  9:01 PM  Result Value Ref Range   Glucose-Capillary 139 (H) 65 - 99 mg/dL  Basic metabolic panel     Status: Abnormal   Collection Time: 05/13/17  5:19 AM  Result Value Ref Range   Sodium 136 135 - 145 mmol/L   Potassium 3.6 3.5 - 5.1 mmol/L   Chloride 108 101 - 111 mmol/L   CO2 24 22 - 32 mmol/L   Glucose, Bld 149 (H) 65 - 99 mg/dL   BUN 19 6 - 20 mg/dL   Creatinine, Ser 8.110.38 (L) 0.44 - 1.00 mg/dL   Calcium 8.1 (L) 8.9 - 10.3 mg/dL   GFR calc non Af Amer >60 >60 mL/min   GFR calc Af Amer >60 >60 mL/min   Anion gap 4 (L) 5 - 15  CBC     Status: Abnormal   Collection Time: 05/13/17  5:19 AM  Result Value Ref Range   WBC 9.2 3.6 - 11.0 K/uL   RBC 3.61 (L) 3.80 - 5.20 MIL/uL   Hemoglobin 11.5 (L) 12.0 - 16.0 g/dL   HCT 91.433.2 (L) 78.235.0 - 95.647.0 %   MCV 92.1 80.0 - 100.0 fL   MCH 31.7 26.0 - 34.0 pg   MCHC 34.5 32.0 - 36.0 g/dL   RDW 21.313.8 08.611.5 - 57.814.5 %   Platelets 166 150 - 440 K/uL  Glucose, capillary     Status: Abnormal   Collection Time: 05/13/17  7:25 AM  Result Value Ref Range   Glucose-Capillary 139 (H) 65 - 99 mg/dL  Glucose, capillary     Status: Abnormal   Collection Time:  05/13/17  9:06 AM  Result Value Ref Range   Glucose-Capillary 136 (H) 65 - 99 mg/dL  Glucose, capillary     Status: Abnormal   Collection Time: 05/13/17 11:45 AM  Result Value Ref Range   Glucose-Capillary 184 (H) 65 - 99 mg/dL    Dg Pelvis Portable  Result Date: 05/11/2017 CLINICAL DATA:  Status post right hip revision surgery. EXAM: PORTABLE PELVIS 1-2 VIEWS COMPARISON:  06/12/2013 FINDINGS: Knee right hip hemiarthroplasty appears well-seated and well aligned on this single AP view. There is no acute fracture or evidence of an operative complication. There old healed pubic rami fractures. IMPRESSION: 1. Well-positioned right hip arthroplasty. Electronically Signed   By: Amie Portlandavid  Ormond M.D.   On: 05/11/2017 17:37    Assessment/Plan: 2 Days Post-Op   Principal Problem:   Hip fracture, unspecified laterality, closed, initial encounter [facility](HCC) Active Problems:   Hip fracture (HCC)  Up with therapy Discharge to SNF, [facility]  Patient is cleared from an orthopedic standpoint.   Lyndle Herrlich , MD 05/13/2017, 12:39 PM

## 2017-05-13 NOTE — Progress Notes (Signed)
Physical Therapy Treatment Patient Details Name: Kara Moore MRN: [id number] DOB: 1935/07/27 Today's Date: 05/13/2017    History of Present Illness presented to ER status post mechanical fall while going to the bathroom; admitted with subcapital fracture of R hip, status post R hip hemi-arthroplasty (05/11/17), WBAT, posterior THPs.    PT Comments    Initial attempt, pt with visitors and wishes to remain up in chair a while longer. Second attempt, pt agreeable to PT; reports mild pain in Right lower extremity and fatigue. Total A of 2 to perform sit to/from stand and pivot transfer chair to bed. Total assist of 2 as well to reposition pt upward in bed. Pt participates in supine exercises with assist; mild grimacing with Right lower extremity movement. Pt demonstrates poor strength with isometric contraction. Continue PT to progress range, strength and endurance to promote assist with functional mobility.     Follow Up Recommendations  SNF     Equipment Recommendations  Rolling walker with 5" wheels    Recommendations for Other Services       Precautions / Restrictions Precautions Precautions: Posterior Hip;Fall Restrictions Weight Bearing Restrictions: Yes RLE Weight Bearing: Weight bearing as tolerated    Mobility  Bed Mobility Overal bed mobility: Needs Assistance Bed Mobility: Sit to Supine       Sit to supine: Total assist;+2 for physical assistance   General bed mobility comments: Total assist of 2 to return to bed and position in bed.   Transfers Overall transfer level: Needs assistance Equipment used: None Transfers: Sit to/from [employer] Sit to Stand: Max assist;+2 physical assistance;Total assist Stand pivot transfers: Max assist;+2 safety/equipment;Total assist          Ambulation/Gait             General Gait Details: Unable   Stairs            Wheelchair Mobility    Modified Rankin (Stroke Patients Only)        Balance Overall balance assessment: Needs assistance Sitting-balance support: Feet supported;Bilateral upper extremity supported Sitting balance-Leahy Scale: Poor     Standing balance support: Bilateral upper extremity supported;During functional activity Standing balance-Leahy Scale: Zero Standing balance comment: Does not really attain stand                            Cognition Arousal/Alertness: Lethargic Behavior During Therapy: WFL for tasks assessed/performed Overall Cognitive Status: Within Functional Limits for tasks assessed                                        Exercises Total Joint Exercises Ankle Circles/Pumps: AROM;Both;20 reps;Supine Quad Sets: Strengthening;Both;10 reps;Supine (poor contraction) Gluteal Sets: Strengthening;Both;10 reps;Supine (poor contraction) Short Arc Quad: AAROM;Both;Supine;20 reps Heel Slides: AAROM;Both;20 reps;Supine Hip ABduction/ADduction: AAROM;Both;20 reps;Supine General Exercises - Lower Extremity Straight Leg Raises: AAROM;Both;10 reps;Supine    General Comments        Pertinent Vitals/Pain Pain Assessment: Faces Faces Pain Scale: Hurts little more Pain Location: R hip Pain Descriptors / Indicators: Grimacing Pain Intervention(s): Limited activity within patient's tolerance;Monitored during session;Repositioned    Home Living                      Prior Function            PT Goals (current goals can now be found  in the care plan section) Progress towards PT goals: Not progressing toward goals - comment    Frequency    BID      PT Plan Current plan remains appropriate    Co-evaluation              AM-PAC PT "6 Clicks" Daily Activity  Outcome Measure  Difficulty turning over in bed (including adjusting bedclothes, sheets and blankets)?: Total Difficulty moving from lying on back to sitting on the side of the bed? : Total Difficulty sitting down on and standing up  from a chair with arms (e.g., wheelchair, bedside commode, etc,.)?: Total Help needed moving to and from a bed to chair (including a wheelchair)?: Total Help needed walking in hospital room?: Total Help needed climbing 3-5 steps with a railing? : Total 6 Click Score: 6    End of Session Equipment Utilized During Treatment: Gait belt Activity Tolerance: Patient limited by fatigue;Other (comment) (weakness; Parkinsons) Patient left: in bed;with call bell/phone within reach;with bed alarm set;with family/visitor present;Other (comment) (abductor wedge in place)   PT Visit Diagnosis: Muscle weakness (generalized) (M62.81);Difficulty in walking, not elsewhere classified (R26.2);Pain Pain - Right/Left: Right Pain - part of body: Hip     Time: 1430-1511 PT Time Calculation (min) (ACUTE ONLY): 41 min  Charges:  $Therapeutic Exercise: 23-37 mins $Therapeutic Activity: 8-22 mins                    G Codes:        Scot DockHeidi E Barnes, PTA 05/13/2017, 3:41 PM

## 2017-05-14 DIAGNOSIS — M810 Age-related osteoporosis without current pathological fracture: Secondary | ICD-10-CM | POA: Diagnosis present

## 2017-05-14 DIAGNOSIS — Z7982 Long term (current) use of aspirin: Secondary | ICD-10-CM | POA: Diagnosis not present

## 2017-05-14 DIAGNOSIS — Z993 Dependence on wheelchair: Secondary | ICD-10-CM | POA: Diagnosis not present

## 2017-05-14 DIAGNOSIS — S72102A Unspecified trochanteric fracture of left femur, initial encounter for closed fracture: Secondary | ICD-10-CM | POA: Diagnosis not present

## 2017-05-14 DIAGNOSIS — Z79899 Other long term (current) drug therapy: Secondary | ICD-10-CM | POA: Diagnosis not present

## 2017-05-14 DIAGNOSIS — E119 Type 2 diabetes mellitus without complications: Secondary | ICD-10-CM | POA: Diagnosis not present

## 2017-05-14 DIAGNOSIS — M25512 Pain in left shoulder: Secondary | ICD-10-CM | POA: Diagnosis not present

## 2017-05-14 DIAGNOSIS — E785 Hyperlipidemia, unspecified: Secondary | ICD-10-CM | POA: Diagnosis present

## 2017-05-14 DIAGNOSIS — G20 Parkinson's disease: Secondary | ICD-10-CM | POA: Diagnosis not present

## 2017-05-14 DIAGNOSIS — E039 Hypothyroidism, unspecified: Secondary | ICD-10-CM | POA: Diagnosis not present

## 2017-05-14 DIAGNOSIS — K219 Gastro-esophageal reflux disease without esophagitis: Secondary | ICD-10-CM | POA: Diagnosis present

## 2017-05-14 DIAGNOSIS — S72142A Displaced intertrochanteric fracture of left femur, initial encounter for closed fracture: Secondary | ICD-10-CM | POA: Diagnosis present

## 2017-05-14 DIAGNOSIS — S72011A Unspecified intracapsular fracture of right femur, initial encounter for closed fracture: Secondary | ICD-10-CM | POA: Diagnosis not present

## 2017-05-14 DIAGNOSIS — M6281 Muscle weakness (generalized): Secondary | ICD-10-CM | POA: Diagnosis not present

## 2017-05-14 DIAGNOSIS — F0390 Unspecified dementia without behavioral disturbance: Secondary | ICD-10-CM | POA: Diagnosis present

## 2017-05-14 DIAGNOSIS — Z79891 Long term (current) use of opiate analgesic: Secondary | ICD-10-CM | POA: Diagnosis not present

## 2017-05-14 DIAGNOSIS — S72145A Nondisplaced intertrochanteric fracture of left femur, initial encounter for closed fracture: Secondary | ICD-10-CM | POA: Diagnosis not present

## 2017-05-14 DIAGNOSIS — L89152 Pressure ulcer of sacral region, stage 2: Secondary | ICD-10-CM | POA: Diagnosis present

## 2017-05-14 DIAGNOSIS — Z01810 Encounter for preprocedural cardiovascular examination: Secondary | ICD-10-CM | POA: Diagnosis not present

## 2017-05-14 DIAGNOSIS — R262 Difficulty in walking, not elsewhere classified: Secondary | ICD-10-CM | POA: Diagnosis not present

## 2017-05-14 DIAGNOSIS — Z7984 Long term (current) use of oral hypoglycemic drugs: Secondary | ICD-10-CM | POA: Diagnosis not present

## 2017-05-14 DIAGNOSIS — W050XXA Fall from non-moving wheelchair, initial encounter: Secondary | ICD-10-CM | POA: Diagnosis present

## 2017-05-14 DIAGNOSIS — I10 Essential (primary) hypertension: Secondary | ICD-10-CM | POA: Diagnosis not present

## 2017-05-14 DIAGNOSIS — S0990XA Unspecified injury of head, initial encounter: Secondary | ICD-10-CM | POA: Diagnosis not present

## 2017-05-14 DIAGNOSIS — S72001D Fracture of unspecified part of neck of right femur, subsequent encounter for closed fracture with routine healing: Secondary | ICD-10-CM | POA: Diagnosis not present

## 2017-05-14 DIAGNOSIS — S72009A Fracture of unspecified part of neck of unspecified femur, initial encounter for closed fracture: Secondary | ICD-10-CM | POA: Diagnosis not present

## 2017-05-14 DIAGNOSIS — S72002A Fracture of unspecified part of neck of left femur, initial encounter for closed fracture: Secondary | ICD-10-CM | POA: Diagnosis not present

## 2017-05-14 DIAGNOSIS — E1165 Type 2 diabetes mellitus with hyperglycemia: Secondary | ICD-10-CM | POA: Diagnosis present

## 2017-05-14 DIAGNOSIS — H353221 Exudative age-related macular degeneration, left eye, with active choroidal neovascularization: Secondary | ICD-10-CM | POA: Diagnosis not present

## 2017-05-14 DIAGNOSIS — M170 Bilateral primary osteoarthritis of knee: Secondary | ICD-10-CM | POA: Diagnosis present

## 2017-05-14 DIAGNOSIS — Z8249 Family history of ischemic heart disease and other diseases of the circulatory system: Secondary | ICD-10-CM | POA: Diagnosis not present

## 2017-05-14 DIAGNOSIS — Z7401 Bed confinement status: Secondary | ICD-10-CM | POA: Diagnosis not present

## 2017-05-14 DIAGNOSIS — M25351 Other instability, right hip: Secondary | ICD-10-CM | POA: Diagnosis not present

## 2017-05-14 DIAGNOSIS — S72001A Fracture of unspecified part of neck of right femur, initial encounter for closed fracture: Secondary | ICD-10-CM | POA: Diagnosis not present

## 2017-05-14 DIAGNOSIS — E441 Mild protein-calorie malnutrition: Secondary | ICD-10-CM | POA: Diagnosis not present

## 2017-05-14 DIAGNOSIS — M25551 Pain in right hip: Secondary | ICD-10-CM | POA: Diagnosis not present

## 2017-05-14 DIAGNOSIS — W19XXXA Unspecified fall, initial encounter: Secondary | ICD-10-CM | POA: Diagnosis not present

## 2017-05-14 DIAGNOSIS — Z471 Aftercare following joint replacement surgery: Secondary | ICD-10-CM | POA: Diagnosis not present

## 2017-05-14 DIAGNOSIS — R296 Repeated falls: Secondary | ICD-10-CM | POA: Diagnosis not present

## 2017-05-14 DIAGNOSIS — Z9181 History of falling: Secondary | ICD-10-CM | POA: Diagnosis not present

## 2017-05-14 DIAGNOSIS — Z96641 Presence of right artificial hip joint: Secondary | ICD-10-CM | POA: Diagnosis present

## 2017-05-14 DIAGNOSIS — H3530 Unspecified macular degeneration: Secondary | ICD-10-CM | POA: Diagnosis present

## 2017-05-14 DIAGNOSIS — F0280 Dementia in other diseases classified elsewhere without behavioral disturbance: Secondary | ICD-10-CM | POA: Diagnosis not present

## 2017-05-14 DIAGNOSIS — M7582 Other shoulder lesions, left shoulder: Secondary | ICD-10-CM | POA: Diagnosis not present

## 2017-05-14 DIAGNOSIS — R2681 Unsteadiness on feet: Secondary | ICD-10-CM | POA: Diagnosis not present

## 2017-05-14 DIAGNOSIS — Z833 Family history of diabetes mellitus: Secondary | ICD-10-CM | POA: Diagnosis not present

## 2017-05-14 DIAGNOSIS — Z7983 Long term (current) use of bisphosphonates: Secondary | ICD-10-CM | POA: Diagnosis not present

## 2017-05-14 LAB — BASIC METABOLIC PANEL
ANION GAP: 4 — AB (ref 5–15)
BUN: 17 mg/dL (ref 6–20)
CALCIUM: 7.9 mg/dL — AB (ref 8.9–10.3)
CO2: 23 mmol/L (ref 22–32)
Chloride: 110 mmol/L (ref 101–111)
Creatinine, Ser: 0.43 mg/dL — ABNORMAL LOW (ref 0.44–1.00)
GFR calc Af Amer: >60 mL/min (ref >60)
GLUCOSE: 163 mg/dL — AB (ref 65–99)
POTASSIUM: 3.7 mmol/L (ref 3.5–5.1)
SODIUM: 137 mmol/L (ref 135–145)

## 2017-05-14 LAB — GLUCOSE, CAPILLARY
GLUCOSE-CAPILLARY: 151 mg/dL — AB (ref 65–99)
Glucose-Capillary: 169 mg/dL — ABNORMAL HIGH (ref 65–99)
Glucose-Capillary: 277 mg/dL — ABNORMAL HIGH (ref 65–99)

## 2017-05-14 MED ORDER — TRAMADOL HCL 50 MG PO TABS: ORAL_TABLET | ORAL | 0 refills | Status: DC

## 2017-05-14 MED ORDER — ENOXAPARIN SODIUM 40 MG/0.4ML ~~LOC~~ SOLN: 40.0000 mg | SUBCUTANEOUS | 0 refills | Status: DC

## 2017-05-14 NOTE — Discharge Summary (Signed)
[facility] [place] [facility]   PATIENT NAME: Kara FlemingsMary Moore    MR#:  [id number]  DATE OF BIRTH:  03/11/1935  DATE OF ADMISSION:  05/11/2017 ADMITTING PHYSICIAN: Altamese DillingVaibhavkumar Vachhani, MD  DATE OF DISCHARGE: 05/14/17  PRIMARY CARE PHYSICIAN: Kermit Baloeed, Tiffany L, DO    ADMISSION DIAGNOSIS:  Closed fracture of neck of right femur, initial encounter (HCC) [S72.001A]  DISCHARGE DIAGNOSIS:  Closed fracture of the neck of the right femur status post surgery postop day 3  SECONDARY DIAGNOSIS:   Past Medical History:  Diagnosis Date  . Benign essential hypertension   . Confusion   . Diabetes mellitus without complication (HCC)   . Hyperlipidemia LDL goal < 100   . Hypothyroidism   . Macular degeneration   . Memory loss   . Senile osteoporosis     HOSPITAL COURSE:   Kara FlemingsMary Moore  is a 82 y.o. female with a known history of Htn, DM, Hyperlipidemia, parkinson's- lives in [place] place- walks with walker- last night- was going to bathroom with walker and foot got tangled with something and had a fall.  *Right hip fracture- accidental fall.   S/p surgery POST op day 3 tramadol PRN for pain.   PT and rehab.  * Htn Cont home meds- stable.  * DM ISS, hold oral meds- stable.  * Hyperlipidemia Cont statin  * Hypothyroidism Cont levothyroxine  * parkinson's dz with dementia Cont carbi+ levo dopa  Discussed with Dr. Odis LusterBowers from orthopedics. Patient is doing well. Discharge to rehabilitation today. Discussed with nieces who was agreeable. Patient did have a bowel movement. CONSULTS OBTAINED:  Treatment Team:  Lyndle HerrlichBowers, James R, MD  DRUG ALLERGIES:  No Known Allergies  DISCHARGE MEDICATIONS:   Current Discharge Medication List    START taking these medications   Details  enoxaparin (LOVENOX) 40 MG/0.4ML injection Inject 0.4 mLs (40 mg total) into the skin daily. Qty: 14 Syringe, Refills: 0      CONTINUE these medications which  have CHANGED   Details  traMADol (ULTRAM) 50 MG tablet Take one tablet by mouth twice daily for pain as needed andTake one tablet by mouth at bedtime for pain Qty: 90 tablet, Refills: 0   Associated Diagnoses: Primary osteoarthritis of both knees      CONTINUE these medications which have NOT CHANGED   Details  acetaminophen (TYLENOL) 500 MG tablet Take 1,000 mg by mouth 2 (two) times daily.    alum & mag hydroxide-simeth (MINTOX) 200-200-20 MG/5ML suspension Take 30 mLs by mouth every 6 (six) hours as needed for indigestion or heartburn.    aspirin 81 MG chewable tablet Chew 81 mg by mouth daily.     calcium carbonate (OS-CAL - DOSED IN MG OF ELEMENTAL CALCIUM) 1250 (500 CA) MG tablet Take 1 tablet by mouth daily.     carbidopa-levodopa (SINEMET IR) 25-100 MG tablet Take 1 tablet by mouth 4 (four) times daily. Take at 8, 12, 4 PM, 8 PM. Qty: 120 tablet, Refills: 5   Associated Diagnoses: Parkinson's disease (HCC)    Cholecalciferol 2000 UNITS CAPS Take 1 capsule (2,000 Units total) by mouth daily. Qty: 30 each, Refills: 3    fluticasone (FLONASE) 50 MCG/ACT nasal spray Place 2 sprays into the nose as needed.     furosemide (LASIX) 20 MG tablet Take 1/2 tablet orally every day    glipiZIDE (GLUCOTROL) 5 MG tablet Take 5 mg by mouth daily before breakfast. For Diabetes (elevated blood sugar)  guaifenesin (ROBITUSSIN) 100 MG/5ML syrup Take 200 mg by mouth 4 (four) times daily as needed for cough.    levothyroxine (SYNTHROID, LEVOTHROID) 50 MCG tablet TAKE 1 TABLET BY MOUTH DAILY BEFORE BREAKFAST ON EMPTY STOMACH *BINGO CARD* Qty: 30 tablet, Refills: 2    loperamide (IMODIUM) 2 MG capsule Take 2 mg by mouth. Take one tablet orally with each loose stool as need for diarrhea * NTE 8 doses in 24 hours*    magnesium hydroxide (MILK OF MAGNESIA) 400 MG/5ML suspension Take by mouth daily as needed for constipation.    memantine (NAMENDA) 5 MG tablet Take 1 tablet (5 mg total) by mouth  2 (two) times daily. Qty: 60 tablet, Refills: 3   Associated Diagnoses: Memory loss    metFORMIN (GLUCOPHAGE) 500 MG tablet Take 1 tablet (500 mg total) by mouth 2 (two) times daily with a meal. Qty: 60 tablet, Refills: 3    mirabegron ER (MYRBETRIQ) 50 MG TB24 tablet Take 50 mg by mouth daily.    mirtazapine (REMERON) 7.5 MG tablet Take 1 tablet (7.5 mg total) by mouth at bedtime. Qty: 90 tablet, Refills: 3    Multiple Vitamins-Minerals (ICAPS MV) TABS Take 2 tablets by mouth daily.     neomycin-bacitracin-polymyxin (NEOSPORIN) [phone] ointment Apply 1 application topically 4 (four) times daily.    potassium chloride (K-DUR,KLOR-CON) 10 MEQ tablet Take 10 mEq by mouth daily. Take to prevent potassium loss (due to taking fluid pill)    pravastatin (PRAVACHOL) 80 MG tablet Take 1 tablet (80 mg total) by mouth daily. For Cholesterol Qty: 90 tablet, Refills: 3    ranitidine (ZANTAC) 150 MG capsule Take 150 mg by mouth at bedtime.     Skin Protectants, Misc. (DIMETHICONE-ZINC OXIDE) cream Apply topically 3 (three) times daily.        If you experience worsening of your admission symptoms, develop shortness of breath, life threatening emergency, suicidal or homicidal thoughts you must seek medical attention immediately by calling 911 or calling your MD immediately  if symptoms less severe.  You Must read complete instructions/literature along with all the possible adverse reactions/side effects for all the Medicines you take and that have been prescribed to you. Take any new Medicines after you have completely understood and accept all the possible adverse reactions/side effects.   Please note  You were cared for by a hospitalist during your hospital stay. If you have any questions about your discharge medications or the care you received while you were in the hospital after you are discharged, you can call the unit and asked to speak with the hospitalist on call if the hospitalist  that took care of you is not available. Once you are discharged, your primary care physician will handle any further medical issues. Please note that NO REFILLS for any discharge medications will be authorized once you are discharged, as it is imperative that you return to your primary care physician (or establish a relationship with a primary care physician if you do not have one) for your aftercare needs so that they can reassess your need for medications and monitor your lab values. Today   SUBJECTIVE  Doing well Nieces in the room  VITAL SIGNS:  Blood pressure (!) 154/64, pulse 76, temperature 98.9 F (37.2 C), temperature source Oral, resp. rate 18, height 5\' 3"  (1.6 m), weight 68 kg (150 lb), SpO2 96 %.  I/O:   Intake/Output Summary (Last 24 hours) at 05/14/17 1349 Last data filed at 05/14/17 0800  Gross  per 24 hour  Intake              120 ml  Output                0 ml  Net              120 ml    PHYSICAL EXAMINATION:  GENERAL:  82 y.o.-year-old patient lying in the bed with no acute distress.  EYES: Pupils equal, round, reactive to light and accommodation. No scleral icterus. Extraocular muscles intact.  HEENT: Head atraumatic, normocephalic. Oropharynx and nasopharynx clear.  NECK:  Supple, no jugular venous distention. No thyroid enlargement, no tenderness.  LUNGS: Normal breath sounds bilaterally, no wheezing, rales,rhonchi or crepitation. No use of accessory muscles of respiration.  CARDIOVASCULAR: S1, S2 normal. No murmurs, rubs, or gallops.  ABDOMEN: Soft, non-tender, non-distended. Bowel sounds present. No organomegaly or mass.  EXTREMITIES: No pedal edema, cyanosis, or clubbing.  NEUROLOGIC: Cranial nerves II through XII are intact. Muscle strength 5/5 in all extremities. Sensation intact. Gait not checked.  PSYCHIATRIC: The patient is alert  SKIN: No obvious rash, lesion, or ulcer.   DATA REVIEW:   CBC   Recent Labs Lab 05/13/17 0519  WBC 9.2  HGB 11.5*   HCT 33.2*  PLT 166    Chemistries   Recent Labs Lab 05/12/17 0409  05/14/17 0616  NA 135  < > 137  K 3.8  < > 3.7  CL 105  < > 110  CO2 25  < > 23  GLUCOSE 180*  < > 163*  BUN 11  < > 17  CREATININE 0.58  < > 0.43*  CALCIUM 8.5*  < > 7.9*  AST 24  --   --   ALT <5*  --   --   ALKPHOS 52  --   --   BILITOT 0.7  --   --   < > = values in this interval not displayed.  Microbiology Results   Recent Results (from the past 240 hour(s))  MRSA PCR Screening     Status: None   Collection Time: 05/11/17 10:29 AM  Result Value Ref Range Status   MRSA by PCR NEGATIVE NEGATIVE Final    Comment:        The GeneXpert MRSA Assay (FDA approved for NASAL specimens only), is one component of a comprehensive MRSA colonization surveillance program. It is not intended to diagnose MRSA infection nor to guide or monitor treatment for MRSA infections.     RADIOLOGY:  No results found.   Management plans discussed with the patient, family and they are in agreement.  CODE STATUS:     Code Status Orders        Start     Ordered   05/11/17 1005  Full code  Continuous     05/11/17 1004    Code Status History    Date Active Date Inactive Code Status Order ID Comments User Context   This patient has a current code status but no historical code status.    Advance Directive Documentation     Most Recent Value  Type of Advance Directive  Healthcare Power of Attorney, Living will  Pre-existing out of facility DNR order (yellow form or pink MOST form)  -  "MOST" Form in Place?  -      TOTAL TIME TAKING CARE OF THIS PATIENT: 40 minutes.    Caroly Purewal M.D on 05/14/2017 at 1:49 PM  Between 7am to 6pm -  Pager - [phone] After 6pm go to [email or web address] - Social research officer, government  [facility]  Office  [phone]  CC: Primary care physician; Kermit Balo, DO

## 2017-05-14 NOTE — Progress Notes (Signed)
  Subjective:  Patient reports pain as mild.  She is still somewhat sleepy today and is having difficulty voiding.  Objective:   VITALS:   Vitals:   05/13/17 1430 05/13/17 1628 05/13/17 2303 05/14/17 0815  BP:  134/71 (!) 143/73 (!) 154/64  Pulse: 94 69 86 76  Resp:  16 18 18   Temp:  97.5 F (36.4 C) 98.5 F (36.9 C) 98.9 F (37.2 C)  TempSrc:  Oral Oral Oral  SpO2: 95% 99% 93% 96%  Weight:      Height:        PHYSICAL EXAM:  Neurovascular intact Sensation intact distally Dorsiflexion/Plantar flexion intact Incision: scant drainage No cellulitis present  LABS  Results for orders placed or performed during the hospital encounter of 05/11/17 (from the past 24 hour(s))  Glucose, capillary     Status: Abnormal   Collection Time: 05/13/17  4:28 PM  Result Value Ref Range   Glucose-Capillary 203 (H) 65 - 99 mg/dL  Glucose, capillary     Status: Abnormal   Collection Time: 05/13/17  9:02 PM  Result Value Ref Range   Glucose-Capillary 201 (H) 65 - 99 mg/dL  Basic metabolic panel     Status: Abnormal   Collection Time: 05/14/17  6:16 AM  Result Value Ref Range   Sodium 137 135 - 145 mmol/L   Potassium 3.7 3.5 - 5.1 mmol/L   Chloride 110 101 - 111 mmol/L   CO2 23 22 - 32 mmol/L   Glucose, Bld 163 (H) 65 - 99 mg/dL   BUN 17 6 - 20 mg/dL   Creatinine, Ser 1.610.43 (L) 0.44 - 1.00 mg/dL   Calcium 7.9 (L) 8.9 - 10.3 mg/dL   GFR calc non Af Amer >60 >60 mL/min   GFR calc Af Amer >60 >60 mL/min   Anion gap 4 (L) 5 - 15  Glucose, capillary     Status: Abnormal   Collection Time: 05/14/17  7:35 AM  Result Value Ref Range   Glucose-Capillary 151 (H) 65 - 99 mg/dL  Glucose, capillary     Status: Abnormal   Collection Time: 05/14/17 11:37 AM  Result Value Ref Range   Glucose-Capillary 169 (H) 65 - 99 mg/dL    No results found.  Assessment/Plan: 3 Days Post-Op   Principal Problem:   Hip fracture, unspecified laterality, closed, initial encounter [facility](HCC) Active Problems:  Hip fracture (HCC)   Up with therapy Discharge to SNF likely tomorrow. She is cleared from an orthopedic standpoint.   Lyndle HerrlichJames R Aaran Enberg , MD 05/14/2017, 1:24 PM

## 2017-05-14 NOTE — Care Management Important Message (Signed)
Important Message  Patient Details  Name: Kara Moore MRN: [id number] Date of Birth: 12/21/1934   Medicare Important Message Given:  Yes    Marily MemosLisa M Tanisha Lutes, RN 05/14/2017, 3:05 PM

## 2017-05-14 NOTE — Progress Notes (Signed)
Patient is medically stable for D/C to [facility] today. Per Sue LushAndrea admissions coordinator at [facility] patient can come today to room 225. RN will call report at [phone] and arrange EMS for transport. Clinical Child psychotherapistocial Worker (CSW) sent D/C orders to [address] [facility] via [facility]. Medicare bundle case manager is aware of above. Patient's nieces [name] and Harriett Sineancy are at bedside and aware of above. Please reconsult if future social work needs arise. CSW signing off.   Baker Hughes IncorporatedBailey Davine Coba, LCSW [phone]

## 2017-05-14 NOTE — Progress Notes (Signed)
Patient is being discharged to [facility] room 225. Report called and EMS notified. Family aware.Script in packet.

## 2017-05-14 NOTE — Progress Notes (Signed)
Physical Therapy Treatment Patient Details Name: Kara Moore MRN: [id number] DOB: June 03, 1935 Today's Date: 05/14/2017    History of Present Illness presented to ER status post mechanical fall while going to the bathroom; admitted with subcapital fracture of R hip, status post R hip hemi-arthroplasty (05/11/17), WBAT, posterior THPs.    PT Comments    Pt agreeable to PT; denies pain. Reports feeling "dopey" and difficulty understanding herself when she is talking. Pt lethargic and requires re awakening to continue task at hand. Pt requires assist for all movement and demonstrates poor isometric contraction at right quad and bilateral gluts. Deferred mobility out of bed at this time due to level of lethargy/cognition. No current discharge at this time; family member states she is supposed to discharge today. Informed pt we would continue PT this afternoon if pt is not discharged with the hopes of getting out of bed to the chair; pt agreeable. Continue PT to progress participation, range, strength and endurance to improve functional mobility.   Follow Up Recommendations        Equipment Recommendations       Recommendations for Other Services       Precautions / Restrictions Precautions Precautions: Posterior Hip;Fall Restrictions Weight Bearing Restrictions: Yes RLE Weight Bearing: Weight bearing as tolerated    Mobility  Bed Mobility               General bed mobility comments: Not teseted due to levels of lethargy and feeling confused subjectively  Transfers                    Ambulation/Gait                 Stairs            Wheelchair Mobility    Modified Rankin (Stroke Patients Only)       Balance                                            Cognition Arousal/Alertness: Lethargic Behavior During Therapy: Flat affect;WFL for tasks assessed/performed Overall Cognitive Status: Within Functional Limits for tasks  assessed                                 General Comments: Pt reports feeling dopey; states she does not know what she is saying at times. Difficulty remaining awake. Family memeber notes, pt does have periods like this at baseline      Exercises Total Joint Exercises Ankle Circles/Pumps: AAROM;Both;20 reps;Supine Quad Sets: Strengthening;Both;10 reps;Supine (trace on R) Gluteal Sets: Strengthening;Both;10 reps;Supine (trace B) Towel Squeeze: Strengthening;Both;10 reps;Supine Short Arc Quad: AAROM;Both;AROM;15 reps;Supine Heel Slides: AAROM;Both;15 reps;Supine Hip ABduction/ADduction: AAROM;Both;15 reps;Supine Straight Leg Raises: AAROM;Both;10 reps;Supine    General Comments        Pertinent Vitals/Pain Pain Assessment: No/denies pain    Home Living                      Prior Function            PT Goals (current goals can now be found in the care plan section) Progress towards PT goals: Not progressing toward goals - comment    Frequency    BID      PT Plan Current plan remains appropriate  Co-evaluation              AM-PAC PT "6 Clicks" Daily Activity  Outcome Measure  Difficulty turning over in bed (including adjusting bedclothes, sheets and blankets)?: Total Difficulty moving from lying on back to sitting on the side of the bed? : Total Difficulty sitting down on and standing up from a chair with arms (e.g., wheelchair, bedside commode, etc,.)?: Total Help needed moving to and from a bed to chair (including a wheelchair)?: Total Help needed walking in hospital room?: Total Help needed climbing 3-5 steps with a railing? : Total 6 Click Score: 6    End of Session   Activity Tolerance: Patient limited by lethargy;Other (comment) (mild confusion) Patient left: in bed;with call bell/phone within reach;with bed alarm set;with family/visitor present;Other (comment) (Abductor wedge in place)   PT Visit Diagnosis: Muscle weakness  (generalized) (M62.81);Difficulty in walking, not elsewhere classified (R26.2);Pain Pain - Right/Left: Right Pain - part of body: Hip     Time: [id number] PT Time Calculation (min) (ACUTE ONLY): 25 min  Charges:  $Therapeutic Exercise: 23-37 mins                    G Codes:        Scot DockHeidi E Reizel Calzada, PTA 05/14/2017, 11:32 AM

## 2017-05-14 NOTE — Clinical Social Work Placement (Signed)
   CLINICAL SOCIAL WORK PLACEMENT  NOTE  Date:  05/14/2017  Patient Details  Name: Kara Moore MRN: [id number] Date of Birth: 11/27/1934  Clinical Social Work is seeking post-discharge placement for this patient at the Skilled  Nursing Facility level of care (*CSW will initial, date and re-position this form in  chart as items are completed):  Yes   Patient/family provided with [facility] Clinical Social Work Department's list of facilities offering this level of care within the geographic area requested by the patient (or if unable, by the patient's family).  Yes   Patient/family informed of their freedom to choose among providers that offer the needed level of care, that participate in Medicare, Medicaid or managed care program needed by the patient, have an available bed and are willing to accept the patient.  Yes   Patient/family informed of [facility]'s ownership interest in [facility] and [facility], as well as of the fact that they are under no obligation to receive care at these facilities.  PASRR submitted to EDS on 05/12/17     PASRR number received on 05/12/17 ([id number]  )     Existing PASRR number confirmed on       FL2 transmitted to all facilities in geographic area requested by pt/family on 05/12/17     FL2 transmitted to all facilities within larger geographic area on       Patient informed that his/her managed care company has contracts with or will negotiate with certain facilities, including the following:        Yes   Patient/family informed of bed offers received.  Patient chooses bed at  [facility] )     Physician recommends and patient chooses bed at      Patient to be transferred to  [facility] ) on 05/14/17.  Patient to be [facility] to facility by  [facility] )     Patient family notified on 05/14/17 of transfer.  Name of family member notified:   (Patinet's nieces Harriett Sineancy and [name] are at bedside and aware of  D/C today. )     PHYSICIAN       Additional Comment:    _______________________________________________ Hernandez Losasso, [name], LCSW 05/14/2017, 2:19 PM

## 2017-05-15 ENCOUNTER — Telehealth: Payer: Self-pay

## 2017-05-15 NOTE — Telephone Encounter (Signed)
This is a patient of [facility], who was admitted [facility] after [facility]. Hospital discharge from [facility] on 05/14/17 .

## 2017-05-16 DIAGNOSIS — S72001A Fracture of unspecified part of neck of right femur, initial encounter for closed fracture: Secondary | ICD-10-CM | POA: Diagnosis not present

## 2017-05-17 NOTE — Telephone Encounter (Signed)
Thank you.  She will need an appointment within 2 weeks of discharge from [facility].  TOC visits are not done in SNF, only admissions.  She will be followed by their in-house provider until she leaves there.

## 2017-05-19 NOTE — Telephone Encounter (Signed)
Noted, thank you. FYI to [place].

## 2017-05-20 DIAGNOSIS — G20 Parkinson's disease: Secondary | ICD-10-CM

## 2017-05-20 DIAGNOSIS — S72011A Unspecified intracapsular fracture of right femur, initial encounter for closed fracture: Secondary | ICD-10-CM | POA: Diagnosis not present

## 2017-05-20 DIAGNOSIS — I10 Essential (primary) hypertension: Secondary | ICD-10-CM | POA: Diagnosis not present

## 2017-05-20 DIAGNOSIS — E119 Type 2 diabetes mellitus without complications: Secondary | ICD-10-CM | POA: Diagnosis not present

## 2017-05-20 DIAGNOSIS — E441 Mild protein-calorie malnutrition: Secondary | ICD-10-CM | POA: Diagnosis not present

## 2017-05-20 DIAGNOSIS — F0280 Dementia in other diseases classified elsewhere without behavioral disturbance: Secondary | ICD-10-CM | POA: Diagnosis not present

## 2017-06-03 DIAGNOSIS — H353221 Exudative age-related macular degeneration, left eye, with active choroidal neovascularization: Secondary | ICD-10-CM | POA: Diagnosis not present

## 2017-06-25 DIAGNOSIS — Z96641 Presence of right artificial hip joint: Secondary | ICD-10-CM | POA: Diagnosis not present

## 2017-06-28 ENCOUNTER — Inpatient Hospital Stay
Admission: EM | Admit: 2017-06-28 | Discharge: 2017-07-01 | DRG: 482 | Disposition: A | Discharge status: Skilled Nursing Facility | Payer: Medicare Other | Attending: Internal Medicine | Admitting: Internal Medicine

## 2017-06-28 ENCOUNTER — Emergency Department: Payer: Medicare Other

## 2017-06-28 ENCOUNTER — Encounter: Payer: Self-pay | Admitting: Emergency Medicine

## 2017-06-28 DIAGNOSIS — S72002A Fracture of unspecified part of neck of left femur, initial encounter for closed fracture: Secondary | ICD-10-CM | POA: Diagnosis present

## 2017-06-28 DIAGNOSIS — Z471 Aftercare following joint replacement surgery: Secondary | ICD-10-CM | POA: Diagnosis not present

## 2017-06-28 DIAGNOSIS — K219 Gastro-esophageal reflux disease without esophagitis: Secondary | ICD-10-CM | POA: Diagnosis present

## 2017-06-28 DIAGNOSIS — S72001D Fracture of unspecified part of neck of right femur, subsequent encounter for closed fracture with routine healing: Secondary | ICD-10-CM | POA: Diagnosis not present

## 2017-06-28 DIAGNOSIS — R296 Repeated falls: Secondary | ICD-10-CM | POA: Diagnosis not present

## 2017-06-28 DIAGNOSIS — S72142A Displaced intertrochanteric fracture of left femur, initial encounter for closed fracture: Secondary | ICD-10-CM | POA: Diagnosis present

## 2017-06-28 DIAGNOSIS — M25512 Pain in left shoulder: Secondary | ICD-10-CM | POA: Diagnosis not present

## 2017-06-28 DIAGNOSIS — Z7982 Long term (current) use of aspirin: Secondary | ICD-10-CM

## 2017-06-28 DIAGNOSIS — I10 Essential (primary) hypertension: Secondary | ICD-10-CM | POA: Diagnosis present

## 2017-06-28 DIAGNOSIS — R2681 Unsteadiness on feet: Secondary | ICD-10-CM | POA: Diagnosis not present

## 2017-06-28 DIAGNOSIS — L89152 Pressure ulcer of sacral region, stage 2: Secondary | ICD-10-CM | POA: Diagnosis present

## 2017-06-28 DIAGNOSIS — Z993 Dependence on wheelchair: Secondary | ICD-10-CM

## 2017-06-28 DIAGNOSIS — L8990 Pressure ulcer of unspecified site, unspecified stage: Secondary | ICD-10-CM | POA: Insufficient documentation

## 2017-06-28 DIAGNOSIS — M170 Bilateral primary osteoarthritis of knee: Secondary | ICD-10-CM | POA: Diagnosis present

## 2017-06-28 DIAGNOSIS — Z79899 Other long term (current) drug therapy: Secondary | ICD-10-CM

## 2017-06-28 DIAGNOSIS — G20 Parkinson's disease: Secondary | ICD-10-CM | POA: Diagnosis present

## 2017-06-28 DIAGNOSIS — F0390 Unspecified dementia without behavioral disturbance: Secondary | ICD-10-CM | POA: Diagnosis not present

## 2017-06-28 DIAGNOSIS — Z96641 Presence of right artificial hip joint: Secondary | ICD-10-CM | POA: Diagnosis present

## 2017-06-28 DIAGNOSIS — Z9181 History of falling: Secondary | ICD-10-CM

## 2017-06-28 DIAGNOSIS — Z833 Family history of diabetes mellitus: Secondary | ICD-10-CM | POA: Diagnosis not present

## 2017-06-28 DIAGNOSIS — Z79891 Long term (current) use of opiate analgesic: Secondary | ICD-10-CM | POA: Diagnosis not present

## 2017-06-28 DIAGNOSIS — W19XXXA Unspecified fall, initial encounter: Secondary | ICD-10-CM | POA: Diagnosis not present

## 2017-06-28 DIAGNOSIS — H3530 Unspecified macular degeneration: Secondary | ICD-10-CM | POA: Diagnosis present

## 2017-06-28 DIAGNOSIS — E039 Hypothyroidism, unspecified: Secondary | ICD-10-CM | POA: Diagnosis present

## 2017-06-28 DIAGNOSIS — S0990XA Unspecified injury of head, initial encounter: Secondary | ICD-10-CM | POA: Diagnosis not present

## 2017-06-28 DIAGNOSIS — Z7984 Long term (current) use of oral hypoglycemic drugs: Secondary | ICD-10-CM | POA: Diagnosis not present

## 2017-06-28 DIAGNOSIS — Z01810 Encounter for preprocedural cardiovascular examination: Secondary | ICD-10-CM | POA: Diagnosis not present

## 2017-06-28 DIAGNOSIS — R6889 Other general symptoms and signs: Secondary | ICD-10-CM | POA: Diagnosis not present

## 2017-06-28 DIAGNOSIS — S72009A Fracture of unspecified part of neck of unspecified femur, initial encounter for closed fracture: Secondary | ICD-10-CM | POA: Diagnosis not present

## 2017-06-28 DIAGNOSIS — W050XXA Fall from non-moving wheelchair, initial encounter: Secondary | ICD-10-CM | POA: Diagnosis present

## 2017-06-28 DIAGNOSIS — S72145A Nondisplaced intertrochanteric fracture of left femur, initial encounter for closed fracture: Secondary | ICD-10-CM | POA: Diagnosis not present

## 2017-06-28 DIAGNOSIS — S72102A Unspecified trochanteric fracture of left femur, initial encounter for closed fracture: Secondary | ICD-10-CM | POA: Diagnosis not present

## 2017-06-28 DIAGNOSIS — Z7401 Bed confinement status: Secondary | ICD-10-CM | POA: Diagnosis not present

## 2017-06-28 DIAGNOSIS — E1165 Type 2 diabetes mellitus with hyperglycemia: Secondary | ICD-10-CM | POA: Diagnosis present

## 2017-06-28 DIAGNOSIS — M7582 Other shoulder lesions, left shoulder: Secondary | ICD-10-CM | POA: Diagnosis not present

## 2017-06-28 DIAGNOSIS — Z8249 Family history of ischemic heart disease and other diseases of the circulatory system: Secondary | ICD-10-CM | POA: Diagnosis not present

## 2017-06-28 DIAGNOSIS — E785 Hyperlipidemia, unspecified: Secondary | ICD-10-CM | POA: Diagnosis present

## 2017-06-28 DIAGNOSIS — M25519 Pain in unspecified shoulder: Secondary | ICD-10-CM

## 2017-06-28 DIAGNOSIS — M6281 Muscle weakness (generalized): Secondary | ICD-10-CM | POA: Diagnosis not present

## 2017-06-28 DIAGNOSIS — M810 Age-related osteoporosis without current pathological fracture: Secondary | ICD-10-CM | POA: Diagnosis present

## 2017-06-28 DIAGNOSIS — Z7983 Long term (current) use of bisphosphonates: Secondary | ICD-10-CM | POA: Diagnosis not present

## 2017-06-28 LAB — CBC WITH DIFFERENTIAL/PLATELET
Basophils Absolute: 0 10*3/uL (ref 0–0.1)
Basophils Relative: 0 %
EOS ABS: 0.1 10*3/uL (ref 0–0.7)
Eosinophils Relative: 2 %
HEMATOCRIT: 38.2 % (ref 35.0–47.0)
HEMOGLOBIN: 12.7 g/dL (ref 12.0–16.0)
LYMPHS ABS: 2.1 10*3/uL (ref 1.0–3.6)
LYMPHS PCT: 25 %
MCH: 30 pg (ref 26.0–34.0)
MCHC: 33.3 g/dL (ref 32.0–36.0)
MCV: 90 fL (ref 80.0–100.0)
MONOS PCT: 6 %
Monocytes Absolute: 0.5 10*3/uL (ref 0.2–0.9)
NEUTROS ABS: 5.8 10*3/uL (ref 1.4–6.5)
NEUTROS PCT: 67 %
Platelets: 318 10*3/uL (ref 150–440)
RBC: 4.24 MIL/uL (ref 3.80–5.20)
RDW: 15.1 % — ABNORMAL HIGH (ref 11.5–14.5)
WBC: 8.6 10*3/uL (ref 3.6–11.0)

## 2017-06-28 LAB — GLUCOSE, CAPILLARY: Glucose-Capillary: 160 mg/dL — ABNORMAL HIGH (ref 65–99)

## 2017-06-28 LAB — BASIC METABOLIC PANEL
Anion gap: 11 (ref 5–15)
BUN: 14 mg/dL (ref 6–20)
CHLORIDE: 101 mmol/L (ref 101–111)
CO2: 26 mmol/L (ref 22–32)
CREATININE: 0.52 mg/dL (ref 0.44–1.00)
Calcium: 9.3 mg/dL (ref 8.9–10.3)
GFR calc Af Amer: >60 mL/min (ref >60)
GFR calc non Af Amer: >60 mL/min (ref >60)
Glucose, Bld: 161 mg/dL — ABNORMAL HIGH (ref 65–99)
POTASSIUM: 4 mmol/L (ref 3.5–5.1)
Sodium: 138 mmol/L (ref 135–145)

## 2017-06-28 LAB — TYPE AND SCREEN
ABO/RH(D): O NEG
ANTIBODY SCREEN: NEGATIVE

## 2017-06-28 LAB — MRSA PCR SCREENING: MRSA BY PCR: NEGATIVE

## 2017-06-28 LAB — PROTIME-INR
INR: 0.98
Prothrombin Time: 13 seconds (ref 11.4–15.2)

## 2017-06-28 MED ORDER — POTASSIUM CHLORIDE CRYS ER 10 MEQ PO TBCR
10.0000 meq | EXTENDED_RELEASE_TABLET | Freq: Every day | ORAL | Status: DC
  Administered 2017-06-30 – 2017-07-01 (×2): 10 meq via ORAL
  Filled 2017-06-28 (×2): qty 1

## 2017-06-28 MED ORDER — INSULIN ASPART 100 UNIT/ML ~~LOC~~ SOLN: 0.0000 [IU] | Freq: Every day | SUBCUTANEOUS | Status: DC

## 2017-06-28 MED ORDER — PRAVASTATIN SODIUM 20 MG PO TABS
80.0000 mg | ORAL_TABLET | Freq: Every day | ORAL | Status: DC
  Administered 2017-06-30 – 2017-07-01 (×2): 80 mg via ORAL
  Filled 2017-06-28 (×2): qty 4

## 2017-06-28 MED ORDER — CALCIUM CARBONATE 1250 (500 CA) MG PO TABS
1.0000 | ORAL_TABLET | Freq: Every day | ORAL | Status: DC
  Filled 2017-06-28: qty 1

## 2017-06-28 MED ORDER — CARBIDOPA-LEVODOPA 25-100 MG PO TABS
1.0000 | ORAL_TABLET | Freq: Four times a day (QID) | ORAL | Status: DC
  Administered 2017-06-28 – 2017-07-01 (×8): 1 via ORAL
  Filled 2017-06-28 (×13): qty 1

## 2017-06-28 MED ORDER — CEFAZOLIN SODIUM-DEXTROSE 2-4 GM/100ML-% IV SOLN
2.0000 g | INTRAVENOUS | Status: DC
  Filled 2017-06-28 (×2): qty 100

## 2017-06-28 MED ORDER — LEVOTHYROXINE SODIUM 50 MCG PO TABS
50.0000 ug | ORAL_TABLET | Freq: Every day | ORAL | Status: DC
  Administered 2017-06-30 – 2017-07-01 (×2): 50 ug via ORAL
  Filled 2017-06-28 (×2): qty 1

## 2017-06-28 MED ORDER — OCUVITE-LUTEIN PO TABS
1.0000 | ORAL_TABLET | Freq: Every day | ORAL | Status: DC
  Administered 2017-06-30 – 2017-07-01 (×2): 1 via ORAL
  Filled 2017-06-28 (×3): qty 1

## 2017-06-28 MED ORDER — ONDANSETRON HCL 4 MG/2ML IJ SOLN: 4.0000 mg | Freq: Four times a day (QID) | INTRAMUSCULAR | Status: DC | PRN

## 2017-06-28 MED ORDER — FUROSEMIDE 20 MG PO TABS
10.0000 mg | ORAL_TABLET | Freq: Every day | ORAL | Status: DC
  Administered 2017-06-30 – 2017-07-01 (×2): 10 mg via ORAL
  Filled 2017-06-28 (×2): qty 1

## 2017-06-28 MED ORDER — FLUTICASONE PROPIONATE 50 MCG/ACT NA SUSP
2.0000 | Freq: Every day | NASAL | Status: DC
  Administered 2017-06-30 – 2017-07-01 (×2): 2 via NASAL
  Filled 2017-06-28: qty 16

## 2017-06-28 MED ORDER — ACETAMINOPHEN 325 MG PO TABS: 650.0000 mg | ORAL_TABLET | Freq: Four times a day (QID) | ORAL | Status: DC | PRN

## 2017-06-28 MED ORDER — ACETAMINOPHEN 650 MG RE SUPP: 650.0000 mg | Freq: Four times a day (QID) | RECTAL | Status: DC | PRN

## 2017-06-28 MED ORDER — MEMANTINE HCL 5 MG PO TABS
5.0000 mg | ORAL_TABLET | Freq: Two times a day (BID) | ORAL | Status: DC
  Administered 2017-06-28 – 2017-07-01 (×5): 5 mg via ORAL
  Filled 2017-06-28 (×5): qty 1

## 2017-06-28 MED ORDER — ONDANSETRON HCL 4 MG PO TABS: 4.0000 mg | ORAL_TABLET | Freq: Four times a day (QID) | ORAL | Status: DC | PRN

## 2017-06-28 MED ORDER — FAMOTIDINE 20 MG PO TABS
20.0000 mg | ORAL_TABLET | Freq: Every day | ORAL | Status: DC
  Administered 2017-06-28: 20 mg via ORAL
  Filled 2017-06-28: qty 1

## 2017-06-28 MED ORDER — GUAIFENESIN 100 MG/5ML PO SOLN
200.0000 mg | Freq: Four times a day (QID) | ORAL | Status: DC | PRN
Start: 2017-06-28 — End: 2017-07-01
  Filled 2017-06-28: qty 10

## 2017-06-28 MED ORDER — ALUM & MAG HYDROXIDE-SIMETH 200-200-20 MG/5ML PO SUSP: 30.0000 mL | Freq: Four times a day (QID) | ORAL | Status: DC | PRN

## 2017-06-28 MED ORDER — INSULIN ASPART 100 UNIT/ML ~~LOC~~ SOLN
0.0000 [IU] | Freq: Three times a day (TID) | SUBCUTANEOUS | Status: DC
  Administered 2017-06-29 – 2017-07-01 (×7): 2 [IU] via SUBCUTANEOUS
  Filled 2017-06-28 (×6): qty 1

## 2017-06-28 MED ORDER — TRAMADOL HCL 50 MG PO TABS
50.0000 mg | ORAL_TABLET | Freq: Two times a day (BID) | ORAL | Status: DC | PRN
  Administered 2017-06-28 – 2017-07-01 (×3): 50 mg via ORAL
  Filled 2017-06-28 (×3): qty 1

## 2017-06-28 NOTE — ED Notes (Signed)
Pt in Xray at this time.

## 2017-06-28 NOTE — ED Provider Notes (Signed)
[facility] Emergency Department Provider Note   ____________________________________________    I have reviewed the triage vital signs and the nursing notes.   HISTORY  Chief Complaint Fall     HPI Kara Moore is a 82 y.o. female Presents after a fall. patient was apparently transferring from wheelchair and fell forward.patient apparently has dementia and is unable to provide a consistent description of the event. Review of medical records demonstrates that the patient recently had right hip fracture repaired 1.5 months ago. Patient denies headache injury. No neck pain. No upper extremity pain. No chest pain. No nausea or vomiting. She complains only of left hip pain which is severe when she moves. When she holds still it does not hurt. Has not received any medication.    Past Medical History:  Diagnosis Date  . Benign essential hypertension   . Confusion   . Diabetes mellitus without complication (HCC)   . Hyperlipidemia LDL goal < 100   . Hypothyroidism   . Macular degeneration   . Memory loss   . Senile osteoporosis     Patient Active Problem List   Diagnosis Date Noted  . Hip fracture, unspecified laterality, closed, initial encounter (HCC) 05/11/2017  . Hip fracture (HCC) 05/11/2017  . Mixed incontinence urge and stress 12/12/2016  . Primary osteoarthritis of both knees 07/21/2014  . Essential hypertension, benign 07/21/2014  . Other and unspecified hyperlipidemia 07/21/2014  . Osteoarthritis of right knee 01/31/2014  . Diabetes mellitus type 2, uncomplicated (HCC) 01/17/2014  . Right calf pain 01/17/2014  . Parkinson's disease (tremor, stiffness, slow motion, unstable posture) (HCC) 10/19/2013  . Memory loss   . Hypothyroidism   . Macular degeneration   . Senile osteoporosis   . Hyperlipidemia   . Recurrent falls 06/17/2013    Past Surgical History:  Procedure Laterality Date  . ADENOIDECTOMY  2011  . HIP ARTHROPLASTY  Right 05/11/2017   Procedure: ARTHROPLASTY BIPOLAR HIP (HEMIARTHROPLASTY);  Surgeon: Lyndle HerrlichBowers, James R, MD;  Location: [facility] ORS;  Service: Orthopedics;  Laterality: Right;    Prior to Admission medications   Medication Sig Start Date End Date Taking? Authorizing Provider  acetaminophen (TYLENOL) 500 MG tablet Take 1,000 mg by mouth 2 (two) times daily.    [provider]  alum & mag hydroxide-simeth (MINTOX) 200-200-20 MG/5ML suspension Take 30 mLs by mouth every 6 (six) hours as needed for indigestion or heartburn.    [provider]  aspirin 81 MG chewable tablet Chew 81 mg by mouth daily.     [provider]  calcium carbonate (OS-CAL - DOSED IN MG OF ELEMENTAL CALCIUM) 1250 (500 CA) MG tablet Take 1 tablet by mouth daily.     [provider]  carbidopa-levodopa (SINEMET IR) 25-100 MG tablet Take 1 tablet by mouth 4 (four) times daily. Take at 8, 12, 4 PM, 8 PM. 09/12/16   Huston FoleyAthar, Saima, MD  Cholecalciferol 2000 UNITS CAPS Take 1 capsule (2,000 Units total) by mouth daily. 10/28/14   Reed, Tiffany L, DO  enoxaparin (LOVENOX) 40 MG/0.4ML injection Inject 0.4 mLs (40 mg total) into the skin daily. 05/14/17   Enedina FinnerPatel, Sona, MD  fluticasone (FLONASE) 50 MCG/ACT nasal spray Place 2 sprays into the nose as needed.     [provider]  furosemide (LASIX) 20 MG tablet Take 1/2 tablet orally every day    [provider]  glipiZIDE (GLUCOTROL) 5 MG tablet Take 5 mg by mouth daily before breakfast. For Diabetes (  elevated blood sugar) 10/18/13   Reed, Tiffany L, DO  guaifenesin (ROBITUSSIN) 100 MG/5ML syrup Take 200 mg by mouth 4 (four) times daily as needed for cough.    [provider]  levothyroxine (SYNTHROID, LEVOTHROID) 50 MCG tablet TAKE 1 TABLET BY MOUTH DAILY BEFORE BREAKFAST ON EMPTY STOMACH *BINGO CARD* 11/03/14   Sharon Seller, NP  loperamide (IMODIUM) 2 MG capsule Take 2 mg by mouth. Take one tablet orally with each loose stool as need  for diarrhea * NTE 8 doses in 24 hours*    [provider]  magnesium hydroxide (MILK OF MAGNESIA) 400 MG/5ML suspension Take by mouth daily as needed for constipation.    [provider]  memantine (NAMENDA) 5 MG tablet Take 1 tablet (5 mg total) by mouth 2 (two) times daily. 12/12/16   Reed, Tiffany L, DO  metFORMIN (GLUCOPHAGE) 500 MG tablet Take 1 tablet (500 mg total) by mouth 2 (two) times daily with a meal. 01/30/15   Reed, Tiffany L, DO  mirabegron ER (MYRBETRIQ) 50 MG TB24 tablet Take 50 mg by mouth daily.    [provider]  mirtazapine (REMERON) 7.5 MG tablet Take 1 tablet (7.5 mg total) by mouth at bedtime. 02/05/16   Reed, Tiffany L, DO  Multiple Vitamins-Minerals (ICAPS MV) TABS Take 2 tablets by mouth daily.     [provider]  neomycin-bacitracin-polymyxin (NEOSPORIN) [phone] ointment Apply 1 application topically 4 (four) times daily.    [provider]  potassium chloride (K-DUR,KLOR-CON) 10 MEQ tablet Take 10 mEq by mouth daily. Take to prevent potassium loss (due to taking fluid pill)    [provider]  pravastatin (PRAVACHOL) 80 MG tablet Take 1 tablet (80 mg total) by mouth daily. For Cholesterol 02/05/16   Reed, Tiffany L, DO  ranitidine (ZANTAC) 150 MG capsule Take 150 mg by mouth at bedtime.     [provider]  Skin Protectants, Misc. (DIMETHICONE-ZINC OXIDE) cream Apply topically 3 (three) times daily.    [provider]  traMADol Janean Sark) 50 MG tablet Take one tablet by mouth twice daily for pain as needed andTake one tablet by mouth at bedtime for pain 05/14/17   Enedina Finner, MD     Allergies Patient has no known allergies.  Family History  Problem Relation Age of Onset  . Heart attack Father   . Cancer Sister   . Heart attack Brother   . Diabetes Brother   . Tremor Maternal Grandmother   . Diabetes Paternal Grandfather   . Tremor Maternal Aunt   . Diabetes Paternal Grandmother      Social History Social History  Substance Use Topics  . Smoking status: Never Smoker  . Smokeless tobacco: Never Used  . Alcohol use No    Review of Systems  Constitutional: denies dizziness Eyes: No visual changes.  ENT: No neck pain Cardiovascular: Denies chest pain. Respiratory: Denies shortness of breath. Gastrointestinal: No abdominal pain.  No nausea, no vomiting.   Genitourinary: Negative for dysuria. Musculoskeletal: left hip pain Skin: Negative for rash. Neurological: Negative for headaches   ____________________________________________   PHYSICAL EXAM:  VITAL SIGNS: ED Triage Vitals  Enc Vitals Group     BP 06/28/17 1737 (!) 176/73     Pulse Rate 06/28/17 1737 73     Resp 06/28/17 1737 19     Temp 06/28/17 1737 97.7 F (36.5 C)     Temp Source 06/28/17 1737 Oral     SpO2 06/28/17 1737 100 %  Weight 06/28/17 1733 68 kg (150 lb)     Height 06/28/17 1733 1.626 m (5\' 4" )     Head Circumference --      Peak Flow --      Pain Score --      Pain Loc --      Pain Edu? --      Excl. in GC? --     Constitutional: Alert. No acute distress.  Eyes: Conjunctivae are normal.  Head: Atraumatic. Nose: No congestion/rhinnorhea. Mouth/Throat: Mucous membranes are moist.   Neck:  Painless ROM, no vertebral tenderness to palpation Cardiovascular: Normal rate, regular rhythm. Grossly normal heart sounds.  Good peripheral circulation. Respiratory: Normal respiratory effort.  No retractions. Lungs CTAB. Gastrointestinal: Soft and nontender. No distention.  No CVA tenderness. Genitourinary: deferred Musculoskeletal: left hip obvious deformity. Shortened. 2+ distal pulses.  Warm and well perfused. Other extremities normal Neurologic:  Normal speech and language. No gross focal neurologic deficits are appreciated.  Skin:  Skin is warm, dry and intact. No rash noted. Psychiatric: Mood and affect are normal. Speech and behavior are  normal.  ____________________________________________   LABS (all labs ordered are listed, but only abnormal results are displayed)  Labs Reviewed  BASIC METABOLIC PANEL  CBC WITH DIFFERENTIAL/PLATELET  PROTIME-INR  TYPE AND SCREEN   ____________________________________________  EKG   ____________________________________________  RADIOLOGY  comminuted intertrochanteric fracture of the left femur ____________________________________________   PROCEDURES  Procedure(s) performed: No    Critical Care performed: No ____________________________________________   INITIAL IMPRESSION / ASSESSMENT AND PLAN / ED COURSE  Pertinent labs & imaging results that were available during my care of the patient were reviewed by me and considered in my medical decision making (see chart for details).  Patient presents after a fall with obvious deformity of the left hip. X-ray confirms fracture. Discussed with Dr. Joice Lofts who plans to operate tomorrow morning, will admit to internal medicine.    ____________________________________________   FINAL CLINICAL IMPRESSION(S) / ED DIAGNOSES  Final diagnoses:  Closed fracture of left hip, initial encounter [facility])      NEW MEDICATIONS STARTED DURING THIS VISIT:  New Prescriptions   No medications on file     Note:  This document was prepared using Dragon voice recognition software and may include unintentional dictation errors.    Jene Every, MD 06/28/17 [phone]

## 2017-06-28 NOTE — ED Notes (Signed)
This RN to bedside at this time. Explained that this RN would be handing off to night shift but if she had any needs to please let this RN or another staff member know. Pt's family states understanding, noted to be sitting at bedside crocheting.

## 2017-06-28 NOTE — ED Notes (Signed)
External urinary catheter placed by this RN and Morrie SheldonAshley, RN. Pt tolerated well. Pt placed in clean brief. Pt's family at bedside at this time. Will continue to monitor for further patient needs.
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
## 2017-06-28 NOTE — Consult Note (Signed)
ORTHOPAEDIC CONSULTATION  REQUESTING PHYSICIAN: Houston Siren, MD  Chief Complaint:   Left hip pain.  History of Present Illness: Kara Moore is a 82 y.o. female with multiple medical problems including diabetes, hypertension, hypothyroidism, macular degeneration, and osteoporosis who was in an assisted living facility recuperating from a right hip hemiarthroplasty performed nearly 7 weeks ago for a right femoral neck fracture. Apparently, the patient was leaning forward in her wheelchair when she lost her balance and fell out of the wheelchair onto her left side. She was unable to get up and therefore was brought to the emergency room where x-rays demonstrated a displaced intertrochanteric fracture of the left hip. The patient denies any associated injuries. She denies any lightheadedness, chest pain, shortness of breath, or other symptoms may have precipitated her fall. She notes that this hip fracture does not hurt as much as the right hip fracture did. She denies any numbness or paresthesias down her leg.  Past Medical History:  Diagnosis Date  . Benign essential hypertension   . Confusion   . Diabetes mellitus without complication (HCC)   . Hyperlipidemia LDL goal < 100   . Hypothyroidism   . Macular degeneration   . Memory loss   . Senile osteoporosis    Past Surgical History:  Procedure Laterality Date  . ADENOIDECTOMY  2011  . HIP ARTHROPLASTY Right 05/11/2017   Procedure: ARTHROPLASTY BIPOLAR HIP (HEMIARTHROPLASTY);  Surgeon: Lyndle Herrlich, MD;  Location: [facility] ORS;  Service: Orthopedics;  Laterality: Right;   Social History   Social History  . Marital status: Single    Spouse name: N/A  . Number of children: N/A  . Years of education: N/A   Social History Main Topics  . Smoking status: Never Smoker  . Smokeless tobacco: Never Used  . Alcohol use No  . Drug use: No  . Sexual activity: No   Other  Topics Concern  . None   Social History Narrative  . None   Family History  Problem Relation Age of Onset  . Heart attack Father   . Cancer Sister   . Heart attack Brother   . Diabetes Brother   . Tremor Maternal Grandmother   . Diabetes Paternal Grandfather   . Tremor Maternal Aunt   . Diabetes Paternal Grandmother    No Known Allergies Prior to Admission medications   Medication Sig Start Date End Date Taking? Authorizing Provider  acetaminophen (TYLENOL) 325 MG tablet Take 650 mg by mouth 3 (three) times daily.    Yes [provider]  alum & mag hydroxide-simeth (MINTOX) 200-200-20 MG/5ML suspension Take 30 mLs by mouth every 6 (six) hours as needed for indigestion or heartburn.   Yes [provider]  calcium carbonate (OS-CAL - DOSED IN MG OF ELEMENTAL CALCIUM) 1250 (500 CA) MG tablet Take 1 tablet by mouth daily.    Yes [provider]  carbidopa-levodopa (SINEMET IR) 25-100 MG tablet Take 1 tablet by mouth 4 (four) times daily. Take at 8, 12, 4 PM, 8 PM. 09/12/16  Yes Huston Foley, MD  fluticasone (FLONASE) 50 MCG/ACT nasal spray Place 2 sprays into the nose as needed.    Yes [provider]  furosemide (LASIX) 20 MG tablet Take 10 mg by mouth daily. Take 1/2 tablet orally every day   Yes [provider]  glipiZIDE (GLUCOTROL) 5 MG tablet Take 5 mg by mouth daily before breakfast. For Diabetes (elevated blood sugar) 10/18/13  Yes Reed, Tiffany L, DO  guaifenesin (ROBITUSSIN) 100 MG/5ML syrup Take 200 mg by mouth 4 (four) times daily as needed for cough.   Yes [provider]  levothyroxine (SYNTHROID, LEVOTHROID) 50 MCG tablet TAKE 1 TABLET BY MOUTH DAILY BEFORE BREAKFAST ON EMPTY STOMACH *BINGO CARD* 11/03/14  Yes Sharon SellerEubanks, Jessica K, NP  magnesium hydroxide (MILK OF MAGNESIA) 400 MG/5ML suspension Take by mouth daily as needed for constipation.   Yes [provider]  memantine (NAMENDA) 5 MG tablet Take 1 tablet (5 mg  total) by mouth 2 (two) times daily. 12/12/16  Yes Reed, Tiffany L, DO  metFORMIN (GLUCOPHAGE) 500 MG tablet Take 1 tablet (500 mg total) by mouth 2 (two) times daily with a meal. 01/30/15  Yes Reed, Tiffany L, DO  Multiple Vitamins-Minerals (DECUBI-VITE PO) Take 1 capsule by mouth daily.   Yes [provider]  Multiple Vitamins-Minerals (ICAPS MV) TABS Take 2 tablets by mouth daily.    Yes [provider]  potassium chloride (K-DUR,KLOR-CON) 10 MEQ tablet Take 10 mEq by mouth daily. Take to prevent potassium loss (due to taking fluid pill)   Yes [provider]  pravastatin (PRAVACHOL) 80 MG tablet Take 1 tablet (80 mg total) by mouth daily. For Cholesterol 02/05/16  Yes Reed, Tiffany L, DO  ranitidine (ZANTAC) 150 MG capsule Take 150 mg by mouth at bedtime.    Yes [provider]  Skin Protectants, Misc. (DIMETHICONE-ZINC OXIDE) cream Apply topically 3 (three) times daily.   Yes [provider]  traMADol (ULTRAM) 50 MG tablet Take one tablet by mouth twice daily for pain as needed andTake one tablet by mouth at bedtime for pain 05/14/17  Yes Enedina FinnerPatel, Sona, MD  Cholecalciferol 2000 UNITS CAPS Take 1 capsule (2,000 Units total) by mouth daily. Patient not taking: Reported on 06/28/2017 10/28/14   Renato Gailseed, Tiffany L, DO  enoxaparin (LOVENOX) 40 MG/0.4ML injection Inject 0.4 mLs (40 mg total) into the skin daily. Patient not taking: Reported on 06/28/2017 05/14/17   Enedina FinnerPatel, Sona, MD  mirtazapine (REMERON) 7.5 MG tablet Take 1 tablet (7.5 mg total) by mouth at bedtime. Patient not taking: Reported on 06/28/2017 02/05/16   Kermit Baloeed, Tiffany L, DO   Dg Chest 1 View  Result Date: 06/28/2017 CLINICAL DATA:  LEFT hip fracture EXAM: CHEST 1 VIEW COMPARISON:  11/12/2014 FINDINGS: Enlargement of cardiac silhouette. Mediastinal contours and pulmonary vascularity normal. Lungs clear. No pleural effusion or pneumothorax. Marked osseous demineralization. IMPRESSION: Enlargement of  cardiac silhouette without acute abnormalities. Electronically Signed   By: Ulyses SouthwardMark  Boles M.D.   On: 06/28/2017 18:13   Dg Hip Unilat With Pelvis 2-3 Views Left  Result Date: 06/28/2017 CLINICAL DATA:  LEFT hip pain, injury EXAM: DG HIP (WITH OR WITHOUT PELVIS) 2-3V LEFT COMPARISON:  05/11/2017 FINDINGS: Marked osseous demineralization. Components of a RIGHT hip prosthesis are identified without dislocation. Comminuted displaced intertrochanteric fracture LEFT femur with varus angulation. No dislocation of the LEFT femoral head. SI joints symmetric. Irregularities at the inferior pubic rami bilaterally question sequela of remote fractures, unchanged. IMPRESSION: Marked osseous demineralization. Comminuted intertrochanteric fracture LEFT femur varus angulation. Electronically Signed   By: Ulyses SouthwardMark  Boles M.D.   On: 06/28/2017 18:12    Positive ROS: All other systems have been reviewed and were otherwise negative with the exception of those mentioned in the HPI and as above.  Physical Exam: General:  Alert, no acute distress Psychiatric:  Patient is competent for consent with normal mood and affect   Cardiovascular:  No pedal edema Respiratory:  No wheezing, non-labored breathing GI:  Abdomen is soft and non-tender Skin:  No lesions in the area of chief complaint Neurologic:  Sensation intact distally Lymphatic:  No axillary or cervical lymphadenopathy  Orthopedic Exam:  Orthopedic examination is limited to the left hip and lower extremity. The left lower extremity somewhat shortened and externally rotated as compared to the right. Skin inspection around the left hip is notable for mild to moderate swelling, but otherwise is unremarkable. There is no evidence for erythema, ecchymosis, abrasions, lacerations, or any other skin abnormalities. She has mild to moderate tenderness to palpation over the lateral aspect of the hip. She has more severe pain with any attempted active or passive motion of the hip.  She is neurovascularly intact to the left lower extremity and foot as she can actively dorsiflex and plantarflex her toes and ankle. Sensation is intact to light touch to all distributions. She has good capillary refill to her left foot.  X-rays:  X-rays of the pelvis and left hip are available for review. These films demonstrate a comminuted displaced intertrochanteric fracture of the left hip. There does not appear to be any significant degenerative changes of the hip joint. No lytic lesions are identified.  Assessment: Displaced intertrochanteric fracture left hip.  Plan: The treatment options are discussed with the patient, including both surgical and nonsurgical choices. The patient elected to proceed with surgical intervention to include a reduction and intramedullary nailing of the left hip fracture. This procedure has been discussed in detail with the patient, as have the potential risks (including bleeding, infection, nerve and/or blood vessel injury, persistent or recurrent pain, stiffness, malunion and/or nonunion, need for further surgery, blood clots, strokes, heart attacks and/or arrhythmias, etc.) and benefits. The patient states her understanding and wishes to proceed. A formal written consent will be obtained by the nursing staff.  Thank you for asking me to participate in the care of this most pleasant unfortunate woman. I will be happy to follow her with you.   Maryagnes Amos, MD  Beeper #:  [phone]  06/28/2017 10:02 PM

## 2017-06-28 NOTE — H&P (Signed)
[facility] [place] at [facility]   PATIENT NAME: Kara Moore    MR#:  [id number]  DATE OF BIRTH:  05-06-1935  DATE OF ADMISSION:  06/28/2017  PRIMARY CARE PHYSICIAN: Karie Schwalbe, MD   REQUESTING/REFERRING PHYSICIAN: Dr. Jene Every  CHIEF COMPLAINT:   Chief Complaint  Patient presents with  . Fall    HISTORY OF PRESENT ILLNESS:  Kara Moore  is a 82 y.o. female with a known history of Dementia, Parkinson's disease, diabetes, hypothyroidism, hyperlipidemia, osteoporosis who presents to the hospital after a mechanical fall and noted to have a left hip fracture. Patient was recently hospitalized in June for a fall and a right hip fracture and had surgery done now returns again after a fall and now noted to have a left hip fracture. Patient herself is a very poor historian given her dementia and Parkinson's and therefore most of the history obtained from family at bedside. As per the family patient had a mechanical fall attempting get up from her [employer]ift unwitnessed as she was not supposed to and fell to the ground in the bathroom. Patient was brought to the ER and her left lower extremity was shortened and externally rotated in her x-rays confirmed a left hip fracture. Hospitalist services were contacted further treatment and evaluation.   PAST MEDICAL HISTORY:   Past Medical History:  Diagnosis Date  . Benign essential hypertension   . Confusion   . Diabetes mellitus without complication (HCC)   . Hyperlipidemia LDL goal < 100   . Hypothyroidism   . Macular degeneration   . Memory loss   . Senile osteoporosis     PAST SURGICAL HISTORY:   Past Surgical History:  Procedure Laterality Date  . ADENOIDECTOMY  2011  . HIP ARTHROPLASTY Right 05/11/2017   Procedure: ARTHROPLASTY BIPOLAR HIP (HEMIARTHROPLASTY);  Surgeon: Lyndle Herrlich, MD;  Location: [facility] ORS;  Service: Orthopedics;  Laterality: Right;    SOCIAL HISTORY:   Social History   Substance Use Topics  . Smoking status: Never Smoker  . Smokeless tobacco: Never Used  . Alcohol use No    FAMILY HISTORY:   Family History  Problem Relation Age of Onset  . Heart attack Father   . Cancer Sister   . Heart attack Brother   . Diabetes Brother   . Tremor Maternal Grandmother   . Diabetes Paternal Grandfather   . Tremor Maternal Aunt   . Diabetes Paternal Grandmother     DRUG ALLERGIES:  No Known Allergies  REVIEW OF SYSTEMS:   Review of Systems  Unable to perform ROS: Dementia    MEDICATIONS AT HOME:   Prior to Admission medications   Medication Sig Start Date End Date Taking? Authorizing Provider  acetaminophen (TYLENOL) 325 MG tablet Take 650 mg by mouth 3 (three) times daily.    Yes [provider]  alum & mag hydroxide-simeth (MINTOX) 200-200-20 MG/5ML suspension Take 30 mLs by mouth every 6 (six) hours as needed for indigestion or heartburn.   Yes [provider]  calcium carbonate (OS-CAL - DOSED IN MG OF ELEMENTAL CALCIUM) 1250 (500 CA) MG tablet Take 1 tablet by mouth daily.    Yes [provider]  carbidopa-levodopa (SINEMET IR) 25-100 MG tablet Take 1 tablet by mouth 4 (four) times daily. Take at 8, 12, 4 PM, 8 PM. 09/12/16  Yes Huston Foley, MD  fluticasone (FLONASE) 50 MCG/ACT nasal spray Place 2 sprays into the nose as needed.  Yes [provider]  furosemide (LASIX) 20 MG tablet Take 10 mg by mouth daily. Take 1/2 tablet orally every day   Yes [provider]  glipiZIDE (GLUCOTROL) 5 MG tablet Take 5 mg by mouth daily before breakfast. For Diabetes (elevated blood sugar) 10/18/13  Yes Reed, Tiffany L, DO  guaifenesin (ROBITUSSIN) 100 MG/5ML syrup Take 200 mg by mouth 4 (four) times daily as needed for cough.   Yes [provider]  levothyroxine (SYNTHROID, LEVOTHROID) 50 MCG tablet TAKE 1 TABLET BY MOUTH DAILY BEFORE BREAKFAST ON EMPTY STOMACH *BINGO CARD* 11/03/14  Yes Sharon SellerEubanks, Jessica K, NP   magnesium hydroxide (MILK OF MAGNESIA) 400 MG/5ML suspension Take by mouth daily as needed for constipation.   Yes [provider]  memantine (NAMENDA) 5 MG tablet Take 1 tablet (5 mg total) by mouth 2 (two) times daily. 12/12/16  Yes Reed, Tiffany L, DO  metFORMIN (GLUCOPHAGE) 500 MG tablet Take 1 tablet (500 mg total) by mouth 2 (two) times daily with a meal. 01/30/15  Yes Reed, Tiffany L, DO  Multiple Vitamins-Minerals (DECUBI-VITE PO) Take 1 capsule by mouth daily.   Yes [provider]  Multiple Vitamins-Minerals (ICAPS MV) TABS Take 2 tablets by mouth daily.    Yes [provider]  potassium chloride (K-DUR,KLOR-CON) 10 MEQ tablet Take 10 mEq by mouth daily. Take to prevent potassium loss (due to taking fluid pill)   Yes [provider]  pravastatin (PRAVACHOL) 80 MG tablet Take 1 tablet (80 mg total) by mouth daily. For Cholesterol 02/05/16  Yes Reed, Tiffany L, DO  ranitidine (ZANTAC) 150 MG capsule Take 150 mg by mouth at bedtime.    Yes [provider]  Skin Protectants, Misc. (DIMETHICONE-ZINC OXIDE) cream Apply topically 3 (three) times daily.   Yes [provider]  traMADol (ULTRAM) 50 MG tablet Take one tablet by mouth twice daily for pain as needed andTake one tablet by mouth at bedtime for pain 05/14/17  Yes Enedina FinnerPatel, Sona, MD  Cholecalciferol 2000 UNITS CAPS Take 1 capsule (2,000 Units total) by mouth daily. Patient not taking: Reported on 06/28/2017 10/28/14   Renato Gailseed, Tiffany L, DO  enoxaparin (LOVENOX) 40 MG/0.4ML injection Inject 0.4 mLs (40 mg total) into the skin daily. Patient not taking: Reported on 06/28/2017 05/14/17   Enedina FinnerPatel, Sona, MD  mirtazapine (REMERON) 7.5 MG tablet Take 1 tablet (7.5 mg total) by mouth at bedtime. Patient not taking: Reported on 06/28/2017 02/05/16   Bufford Spikeseed, Tiffany L, DO      VITAL SIGNS:  Blood pressure 131/66, pulse 73, temperature 97.7 F (36.5 C), temperature source Oral, resp. rate (!) 24, height 5\' 4"   (1.626 m), weight 68 kg (150 lb), SpO2 100 %.  PHYSICAL EXAMINATION:  Physical Exam  GENERAL:  82 y.o.-year-old patient lying in the bed in NAD.  EYES: Pupils equal, round, reactive to light and accommodation. No scleral icterus. Extraocular muscles intact.  HEENT: Head atraumatic, normocephalic. Oropharynx and nasopharynx clear. No oropharyngeal erythema, moist oral mucosa  NECK:  Supple, no jugular venous distention. No thyroid enlargement, no tenderness.  LUNGS: Normal breath sounds bilaterally, no wheezing, rales, rhonchi. No use of accessory muscles of respiration.  CARDIOVASCULAR: S1, S2 RRR. No murmurs, rubs, gallops, clicks.  ABDOMEN: Soft, nontender, nondistended. Bowel sounds present. No organomegaly or mass.  EXTREMITIES: No pedal edema, cyanosis, or clubbing. + 2 pedal & radial pulses b/l. Left lower extremity is shortened and externally rotated due to the fracture.  NEUROLOGIC: Cranial nerves II through  XII are intact. No focal Motor or sensory deficits appreciated b/l. Globally weak PSYCHIATRIC: The patient is alert and oriented x 1.   SKIN: No obvious rash, lesion, or ulcer.   LABORATORY PANEL:   CBC  Recent Labs Lab 06/28/17 1803  WBC 8.6  HGB 12.7  HCT 38.2  PLT 318   ------------------------------------------------------------------------------------------------------------------  Chemistries   Recent Labs Lab 06/28/17 1803  NA 138  K 4.0  CL 101  CO2 26  GLUCOSE 161*  BUN 14  CREATININE 0.52  CALCIUM 9.3   ------------------------------------------------------------------------------------------------------------------  Cardiac Enzymes No results for input(s): TROPONINI in the last 168 hours. ------------------------------------------------------------------------------------------------------------------  RADIOLOGY:  Dg Chest 1 View  Result Date: 06/28/2017 CLINICAL DATA:  LEFT hip fracture EXAM: CHEST 1 VIEW COMPARISON:  11/12/2014 FINDINGS:  Enlargement of cardiac silhouette. Mediastinal contours and pulmonary vascularity normal. Lungs clear. No pleural effusion or pneumothorax. Marked osseous demineralization. IMPRESSION: Enlargement of cardiac silhouette without acute abnormalities. Electronically Signed   By: Ulyses Southward M.D.   On: 06/28/2017 18:13   Dg Hip Unilat With Pelvis 2-3 Views Left  Result Date: 06/28/2017 CLINICAL DATA:  LEFT hip pain, injury EXAM: DG HIP (WITH OR WITHOUT PELVIS) 2-3V LEFT COMPARISON:  05/11/2017 FINDINGS: Marked osseous demineralization. Components of a RIGHT hip prosthesis are identified without dislocation. Comminuted displaced intertrochanteric fracture LEFT femur with varus angulation. No dislocation of the LEFT femoral head. SI joints symmetric. Irregularities at the inferior pubic rami bilaterally question sequela of remote fractures, unchanged. IMPRESSION: Marked osseous demineralization. Comminuted intertrochanteric fracture LEFT femur varus angulation. Electronically Signed   By: Ulyses Southward M.D.   On: 06/28/2017 18:12     IMPRESSION AND PLAN:   82 year old female with past medical history of Parkinson's disease, advanced dementia, diabetes, hypothyroidism, hyperlipidemia, osteoporosis who presents to the hospital after a mechanical fall and noted to have a left hip fracture.  1. Preoperative cardiovascular examination-patient is a low risk for noncardiac surgery. -No contraindications to surgery at this time.  2. Status post fall and left hip fracture-orthopedics has been consulted. -Patient likely to go to the operating room tomorrow.  3. Diabetes Type II without complication-hold metformin, glipizide. Place on sliding scale insulin.  4. History of Parkinson's disease-continue Sinemet.  5. Hypothyroidism-continue Synthroid.  6. GERD-continue Pepcid.  7. Hyperlipidemia-continue Pravachol.  8. Osteoporosis-continue calcium and vitamin D supplements.  All the records are reviewed and  case discussed with ED provider. Management plans discussed with the patient, family and they are in agreement.  CODE STATUS: Full  TOTAL TIME TAKING CARE OF THIS PATIENT: 45 minutes.    Houston Siren M.D on 06/28/2017 at 7:11 PM  Between 7am to 6pm - Pager - [phone]  After 6pm go to [email or web address] - password [email or web address] [facility]  [place] [facility]  Office  [phone]  CC: Primary care physician; Karie Schwalbe, MD

## 2017-06-28 NOTE — Progress Notes (Signed)
   [employer] - [place] at [facility]   Advance care planning  Hospital Day: 0 days Kara Moore is a 82 y.o. female presenting with Fall .   Advance care planning discussed with patient  with additional Family at bedside. All questions in regards to overall condition and expected prognosis answered. The decision was made to continue current code status  CODE STATUS: full Time spent: 16 minutes

## 2017-06-29 ENCOUNTER — Encounter
Admission: EM | Disposition: A | Discharge status: Skilled Nursing Facility | Payer: Self-pay | Source: Home / Self Care | Attending: Internal Medicine

## 2017-06-29 ENCOUNTER — Inpatient Hospital Stay: Payer: Medicare Other | Admitting: Anesthesiology

## 2017-06-29 ENCOUNTER — Encounter: Payer: Self-pay | Admitting: Anesthesiology

## 2017-06-29 ENCOUNTER — Inpatient Hospital Stay: Payer: Medicare Other

## 2017-06-29 DIAGNOSIS — L8990 Pressure ulcer of unspecified site, unspecified stage: Secondary | ICD-10-CM | POA: Insufficient documentation

## 2017-06-29 HISTORY — PX: INTRAMEDULLARY (IM) NAIL INTERTROCHANTERIC: SHX5875

## 2017-06-29 LAB — GLUCOSE, CAPILLARY
GLUCOSE-CAPILLARY: 166 mg/dL — AB (ref 65–99)
Glucose-Capillary: 154 mg/dL — ABNORMAL HIGH (ref 65–99)
Glucose-Capillary: 159 mg/dL — ABNORMAL HIGH (ref 65–99)
Glucose-Capillary: 168 mg/dL — ABNORMAL HIGH (ref 65–99)

## 2017-06-29 LAB — CBC
HEMATOCRIT: 33.7 % — AB (ref 35.0–47.0)
HEMOGLOBIN: 11.5 g/dL — AB (ref 12.0–16.0)
MCH: 30.3 pg (ref 26.0–34.0)
MCHC: 34 g/dL (ref 32.0–36.0)
MCV: 89.2 fL (ref 80.0–100.0)
Platelets: 270 10*3/uL (ref 150–440)
RBC: 3.78 MIL/uL — ABNORMAL LOW (ref 3.80–5.20)
RDW: 14.8 % — ABNORMAL HIGH (ref 11.5–14.5)
WBC: 11.3 10*3/uL — ABNORMAL HIGH (ref 3.6–11.0)

## 2017-06-29 LAB — BASIC METABOLIC PANEL
ANION GAP: 9 (ref 5–15)
BUN: 13 mg/dL (ref 6–20)
CHLORIDE: 102 mmol/L (ref 101–111)
CO2: 25 mmol/L (ref 22–32)
Calcium: 8.8 mg/dL — ABNORMAL LOW (ref 8.9–10.3)
Creatinine, Ser: 0.62 mg/dL (ref 0.44–1.00)
GFR calc Af Amer: >60 mL/min (ref >60)
GFR calc non Af Amer: >60 mL/min (ref >60)
GLUCOSE: 196 mg/dL — AB (ref 65–99)
POTASSIUM: 4.3 mmol/L (ref 3.5–5.1)
Sodium: 136 mmol/L (ref 135–145)

## 2017-06-29 SURGERY — FIXATION, FRACTURE, INTERTROCHANTERIC, WITH INTRAMEDULLARY ROD
Anesthesia: Spinal | Laterality: Left

## 2017-06-29 MED ORDER — ONDANSETRON HCL 4 MG/2ML IJ SOLN: 4.0000 mg | Freq: Once | INTRAMUSCULAR | Status: DC | PRN

## 2017-06-29 MED ORDER — FENTANYL CITRATE (PF) 100 MCG/2ML IJ SOLN
INTRAMUSCULAR | Status: AC
  Filled 2017-06-29: qty 2

## 2017-06-29 MED ORDER — NEOMYCIN-POLYMYXIN B GU 40-200000 IR SOLN
Status: DC | PRN
  Administered 2017-06-29: 2 mL

## 2017-06-29 MED ORDER — METOCLOPRAMIDE HCL 10 MG PO TABS: 5.0000 mg | ORAL_TABLET | Freq: Three times a day (TID) | ORAL | Status: DC | PRN

## 2017-06-29 MED ORDER — CEFAZOLIN SODIUM-DEXTROSE 2-3 GM-% IV SOLR
INTRAVENOUS | Status: DC | PRN
  Administered 2017-06-29: 2 g via INTRAVENOUS

## 2017-06-29 MED ORDER — MAGNESIUM HYDROXIDE 400 MG/5ML PO SUSP
30.0000 mL | Freq: Every day | ORAL | Status: DC | PRN
  Administered 2017-07-01: 30 mL via ORAL
  Filled 2017-06-29 (×2): qty 30

## 2017-06-29 MED ORDER — PROPOFOL 10 MG/ML IV BOLUS
INTRAVENOUS | Status: AC
  Filled 2017-06-29: qty 20

## 2017-06-29 MED ORDER — DOCUSATE SODIUM 100 MG PO CAPS
100.0000 mg | ORAL_CAPSULE | Freq: Two times a day (BID) | ORAL | Status: DC
  Administered 2017-06-29 – 2017-07-01 (×4): 100 mg via ORAL
  Filled 2017-06-29 (×4): qty 1

## 2017-06-29 MED ORDER — CEFAZOLIN SODIUM-DEXTROSE 2-4 GM/100ML-% IV SOLN
2.0000 g | Freq: Four times a day (QID) | INTRAVENOUS | Status: AC
  Administered 2017-06-29 – 2017-06-30 (×3): 2 g via INTRAVENOUS
  Filled 2017-06-29 (×3): qty 100

## 2017-06-29 MED ORDER — ACETAMINOPHEN 10 MG/ML IV SOLN
INTRAVENOUS | Status: DC | PRN
  Administered 2017-06-29: 1000 mg via INTRAVENOUS

## 2017-06-29 MED ORDER — CALCIUM CARBONATE ANTACID 500 MG PO CHEW
500.0000 mg | CHEWABLE_TABLET | Freq: Every day | ORAL | Status: DC
  Administered 2017-06-30 – 2017-07-01 (×2): 500 mg via ORAL
  Filled 2017-06-29 (×2): qty 3

## 2017-06-29 MED ORDER — ACETAMINOPHEN 10 MG/ML IV SOLN
INTRAVENOUS | Status: AC
  Filled 2017-06-29: qty 100

## 2017-06-29 MED ORDER — NEOMYCIN-POLYMYXIN B GU 40-200000 IR SOLN
Status: AC
  Filled 2017-06-29: qty 2

## 2017-06-29 MED ORDER — PHENYLEPHRINE HCL 10 MG/ML IJ SOLN
INTRAMUSCULAR | Status: DC | PRN
  Administered 2017-06-29 (×4): 50 ug via INTRAVENOUS

## 2017-06-29 MED ORDER — MIDAZOLAM HCL 5 MG/5ML IJ SOLN
INTRAMUSCULAR | Status: DC | PRN
  Administered 2017-06-29: 0.5 mg via INTRAVENOUS

## 2017-06-29 MED ORDER — ONDANSETRON HCL 4 MG/2ML IJ SOLN: 4.0000 mg | Freq: Four times a day (QID) | INTRAMUSCULAR | Status: DC | PRN

## 2017-06-29 MED ORDER — BUPIVACAINE-EPINEPHRINE (PF) 0.5% -1:200000 IJ SOLN
INTRAMUSCULAR | Status: DC | PRN
  Administered 2017-06-29: 30 mL via PERINEURAL

## 2017-06-29 MED ORDER — BUPIVACAINE HCL (PF) 0.5 % IJ SOLN
INTRAMUSCULAR | Status: DC | PRN
  Administered 2017-06-29: 3 mL

## 2017-06-29 MED ORDER — DIPHENHYDRAMINE HCL 12.5 MG/5ML PO ELIX: 12.5000 mg | ORAL_SOLUTION | ORAL | Status: DC | PRN

## 2017-06-29 MED ORDER — ONDANSETRON HCL 4 MG PO TABS: 4.0000 mg | ORAL_TABLET | Freq: Four times a day (QID) | ORAL | Status: DC | PRN

## 2017-06-29 MED ORDER — POTASSIUM CHLORIDE IN NACL 20-0.9 MEQ/L-% IV SOLN
INTRAVENOUS | Status: DC
  Administered 2017-06-29: 15:00:00 via INTRAVENOUS
  Filled 2017-06-29 (×6): qty 1000

## 2017-06-29 MED ORDER — FLEET ENEMA 7-19 GM/118ML RE ENEM: 1.0000 | ENEMA | Freq: Once | RECTAL | Status: DC | PRN

## 2017-06-29 MED ORDER — ACETAMINOPHEN 500 MG PO TABS
1000.0000 mg | ORAL_TABLET | Freq: Four times a day (QID) | ORAL | Status: AC
  Administered 2017-06-29 – 2017-06-30 (×4): 1000 mg via ORAL
  Filled 2017-06-29 (×4): qty 2

## 2017-06-29 MED ORDER — SODIUM CHLORIDE 0.9 % IV SOLN
INTRAVENOUS | Status: DC | PRN
  Administered 2017-06-29: 11:00:00 via INTRAVENOUS

## 2017-06-29 MED ORDER — ACETAMINOPHEN 325 MG PO TABS: 650.0000 mg | ORAL_TABLET | Freq: Four times a day (QID) | ORAL | Status: DC | PRN

## 2017-06-29 MED ORDER — ENOXAPARIN SODIUM 40 MG/0.4ML ~~LOC~~ SOLN
40.0000 mg | SUBCUTANEOUS | Status: DC
  Administered 2017-06-30 – 2017-07-01 (×2): 40 mg via SUBCUTANEOUS
  Filled 2017-06-29 (×2): qty 0.4

## 2017-06-29 MED ORDER — BISACODYL 10 MG RE SUPP
10.0000 mg | Freq: Every day | RECTAL | Status: DC | PRN
  Administered 2017-06-29 – 2017-07-01 (×2): 10 mg via RECTAL
  Filled 2017-06-29 (×2): qty 1

## 2017-06-29 MED ORDER — MIDAZOLAM HCL 2 MG/2ML IJ SOLN
INTRAMUSCULAR | Status: AC
  Filled 2017-06-29: qty 2

## 2017-06-29 MED ORDER — MORPHINE SULFATE (PF) 2 MG/ML IV SOLN: 1.0000 mg | INTRAVENOUS | Status: DC | PRN

## 2017-06-29 MED ORDER — METOCLOPRAMIDE HCL 5 MG/ML IJ SOLN: 5.0000 mg | Freq: Three times a day (TID) | INTRAMUSCULAR | Status: DC | PRN

## 2017-06-29 MED ORDER — BUPIVACAINE-EPINEPHRINE (PF) 0.5% -1:200000 IJ SOLN
INTRAMUSCULAR | Status: AC
  Filled 2017-06-29: qty 30

## 2017-06-29 MED ORDER — PROPOFOL 500 MG/50ML IV EMUL
INTRAVENOUS | Status: DC | PRN
  Administered 2017-06-29: 50 ug/kg/min via INTRAVENOUS

## 2017-06-29 MED ORDER — PANTOPRAZOLE SODIUM 40 MG PO TBEC
40.0000 mg | DELAYED_RELEASE_TABLET | Freq: Every day | ORAL | Status: DC
  Administered 2017-06-30 – 2017-07-01 (×2): 40 mg via ORAL
  Filled 2017-06-29 (×2): qty 1

## 2017-06-29 MED ORDER — FENTANYL CITRATE (PF) 100 MCG/2ML IJ SOLN: 25.0000 ug | INTRAMUSCULAR | Status: DC | PRN

## 2017-06-29 MED ORDER — ACETAMINOPHEN 650 MG RE SUPP: 650.0000 mg | Freq: Four times a day (QID) | RECTAL | Status: DC | PRN

## 2017-06-29 MED ORDER — PROPOFOL 10 MG/ML IV BOLUS
INTRAVENOUS | Status: DC | PRN
Start: 2017-06-29 — End: 2017-06-29
  Administered 2017-06-29: 30 mg via INTRAVENOUS

## 2017-06-29 MED ORDER — OXYCODONE HCL 5 MG PO TABS
5.0000 mg | ORAL_TABLET | ORAL | Status: DC | PRN
  Administered 2017-06-29: 10 mg via ORAL
  Administered 2017-06-29: 5 mg via ORAL
  Administered 2017-06-30: 10 mg via ORAL
  Administered 2017-06-30: 5 mg via ORAL
  Administered 2017-06-30: 10 mg via ORAL
  Administered 2017-07-01: 5 mg via ORAL
  Filled 2017-06-29: qty 1
  Filled 2017-06-29: qty 2
  Filled 2017-06-29: qty 1
  Filled 2017-06-29: qty 2
  Filled 2017-06-29: qty 1
  Filled 2017-06-29: qty 2

## 2017-06-29 SURGICAL SUPPLY — 39 items
BIT DRILL 4.3MMS DISTAL GRDTED (BIT) ×1 IMPLANT
BNDG COHESIVE 4X5 TAN STRL (GAUZE/BANDAGES/DRESSINGS) ×3 IMPLANT
BNDG COHESIVE 6X5 TAN STRL LF (GAUZE/BANDAGES/DRESSINGS) ×3 IMPLANT
CANISTER SUCT 1200ML W/VALVE (MISCELLANEOUS) ×3 IMPLANT
CHLORAPREP W/TINT 26ML (MISCELLANEOUS) ×6 IMPLANT
DRAPE C-ARMOR (DRAPES) ×3 IMPLANT
DRAPE SHEET LG 3/4 BI-LAMINATE (DRAPES) ×3 IMPLANT
DRILL 4.3MMS DISTAL GRADUATED (BIT) ×3
DRSG OPSITE POSTOP 3X4 (GAUZE/BANDAGES/DRESSINGS) ×9 IMPLANT
DRSG OPSITE POSTOP 4X6 (GAUZE/BANDAGES/DRESSINGS) ×3 IMPLANT
ELECT CAUTERY BLADE 6.4 (BLADE) ×3 IMPLANT
ELECT REM PT RETURN 9FT ADLT (ELECTROSURGICAL) ×3
ELECTRODE REM PT RTRN 9FT ADLT (ELECTROSURGICAL) ×1 IMPLANT
GAUZE SPONGE 4X4 12PLY STRL (GAUZE/BANDAGES/DRESSINGS) IMPLANT
GLOVE BIO SURGEON STRL SZ8 (GLOVE) ×9 IMPLANT
GLOVE INDICATOR 8.0 STRL GRN (GLOVE) ×3 IMPLANT
GOWN STRL REUS W/ TWL LRG LVL3 (GOWN DISPOSABLE) ×1 IMPLANT
GOWN STRL REUS W/ TWL XL LVL3 (GOWN DISPOSABLE) ×1 IMPLANT
GOWN STRL REUS W/TWL LRG LVL3 (GOWN DISPOSABLE) ×2
GOWN STRL REUS W/TWL XL LVL3 (GOWN DISPOSABLE) ×2
GUIDEPIN VERSANAIL DSP 3.2X444 (ORTHOPEDIC DISPOSABLE SUPPLIES) ×3 IMPLANT
GUIDEWIRE BALL NOSE 80CM (WIRE) ×3 IMPLANT
HFN LH 130 DEG 11MM X 380MM (Orthopedic Implant) ×3 IMPLANT
MAT BLUE FLOOR 46X72 FLO (MISCELLANEOUS) ×3 IMPLANT
NEEDLE FILTER BLUNT 18X 1/2SAF (NEEDLE) ×2
NEEDLE FILTER BLUNT 18X1 1/2 (NEEDLE) ×1 IMPLANT
NEEDLE HYPO 22GX1.5 SAFETY (NEEDLE) ×3 IMPLANT
NS IRRIG 500ML POUR BTL (IV SOLUTION) ×3 IMPLANT
PACK HIP COMPR (MISCELLANEOUS) ×3 IMPLANT
SCREW BONE CORTICAL 5.0X38 (Screw) ×3 IMPLANT
SCREW LAG HIP NAIL 10.5X95 (Screw) ×3 IMPLANT
SCREWDRIVER HEX TIP 3.5MM (MISCELLANEOUS) ×3 IMPLANT
STAPLER SKIN PROX 35W (STAPLE) ×3 IMPLANT
STRAP SAFETY BODY (MISCELLANEOUS) ×3 IMPLANT
SUT VIC AB 1 CT1 36 (SUTURE) ×3 IMPLANT
SUT VIC AB 2-0 CT1 (SUTURE) ×6 IMPLANT
SYR 30ML LL (SYRINGE) ×3 IMPLANT
SYRINGE 10CC LL (SYRINGE) ×3 IMPLANT
TAPE MICROFOAM 4IN (TAPE) IMPLANT

## 2017-06-29 NOTE — Anesthesia Preprocedure Evaluation (Signed)
Anesthesia Evaluation  Patient identified by MRN, date of birth, ID band Patient awake    Reviewed: Allergy & Precautions, NPO status , Patient's Chart, lab work & pertinent test results  History of Anesthesia Complications Negative for: history of anesthetic complications  Airway Mallampati: III  TM Distance: >3 FB Neck ROM: Full    Dental  (+) Poor Dentition   Pulmonary neg pulmonary ROS, neg sleep apnea, neg COPD,    breath sounds clear to auscultation- rhonchi (-) wheezing      Cardiovascular hypertension, Pt. on medications (-) angina(-) CAD, (-) Past MI and (-) Cardiac Stents  Rhythm:Regular Rate:Normal - Systolic murmurs and - Diastolic murmurs    Neuro/Psych PSYCHIATRIC DISORDERS Parkinson's disease     GI/Hepatic negative GI ROS, Neg liver ROS,   Endo/Other  diabetes, Oral Hypoglycemic AgentsHypothyroidism   Renal/GU negative Renal ROS     Musculoskeletal  (+) Arthritis ,   Abdominal (+) - obese,   Peds  Hematology negative hematology ROS (+)   Anesthesia Other Findings Past Medical History: No date: Benign essential hypertension No date: Confusion No date: Diabetes mellitus without complication (HCC) No date: Hyperlipidemia LDL goal < 100 No date: Hypothyroidism No date: Macular degeneration No date: Memory loss No date: Senile osteoporosis   Reproductive/Obstetrics                             Anesthesia Physical  Anesthesia Plan  ASA: III  Anesthesia Plan: Spinal   Post-op Pain Management:    Induction:   PONV Risk Score and Plan: 2 and Ondansetron and Propofol  Airway Management Planned: Natural Airway  Additional Equipment:   Intra-op Plan:   Post-operative Plan:   Informed Consent: I have reviewed the patients History and Physical, chart, labs and discussed the procedure including the risks, benefits and alternatives for the proposed anesthesia with  the patient or authorized representative who has indicated his/her understanding and acceptance.   Dental advisory given  Plan Discussed with: Anesthesiologist and CRNA  Anesthesia Plan Comments:         Lab Results  Component Value Date   WBC 11.3 (H) 06/29/2017   HGB 11.5 (L) 06/29/2017   HCT 33.7 (L) 06/29/2017   MCV 89.2 06/29/2017   PLT 270 06/29/2017    Anesthesia Quick Evaluation

## 2017-06-29 NOTE — Anesthesia Procedure Notes (Signed)
Spinal  Patient location during procedure: OR Start time: 06/29/2017 11:12 AM End time: 06/29/2017 11:13 AM Staffing Anesthesiologist: Martha Clan Resident/CRNA: BACHICHAnderson Malta Performed: resident/CRNA  Preanesthetic Checklist Completed: patient identified, site marked, surgical consent, pre-op evaluation, timeout performed, IV checked, risks and benefits discussed and monitors and equipment checked Spinal Block Patient position: sitting Prep: ChloraPrep Patient monitoring: heart rate, continuous pulse ox, blood pressure and cardiac monitor Approach: midline Location: L4-5 Injection technique: single-shot Needle Needle type: Whitacre and Introducer  Needle gauge: 24 G Needle length: 9 cm Assessment Sensory level: T10 Additional Notes Negative paresthesia. Negative blood return. Positive free-flowing CSF. Expiration date of kit checked and confirmed. Patient tolerated procedure well, without complications.

## 2017-06-29 NOTE — Progress Notes (Signed)
[facility]   PATIENT NAME: Kara Moore    MR#:  [id number]  DATE OF BIRTH:  04/29/1935  SUBJECTIVE:  CHIEF COMPLAINT:   Chief Complaint  Patient presents with  . Fall  Has significant Parkinson's disease, 2 months ago head fracture on the right hip and after surgery sent to a rehabilitation center where she did not have much recovery and now completely wheelchair bound. Try to get up from her lift chair and fell with fracture on the left hip this time. No new complaints.  REVIEW OF SYSTEMS:  CONSTITUTIONAL: No fever, fatigue or weakness.  EYES: No blurred or double vision.  EARS, NOSE, AND THROAT: No tinnitus or ear pain.  RESPIRATORY: No cough, shortness of breath, wheezing or hemoptysis.  CARDIOVASCULAR: No chest pain, orthopnea, edema.  GASTROINTESTINAL: No nausea, vomiting, diarrhea or abdominal pain.  GENITOURINARY: No dysuria, hematuria.  ENDOCRINE: No polyuria, nocturia,  HEMATOLOGY: No anemia, easy bruising or bleeding SKIN: No rash or lesion. MUSCULOSKELETAL: Left hip pain.   NEUROLOGIC: No tingling, numbness, weakness.  PSYCHIATRY: No anxiety or depression.   ROS  DRUG ALLERGIES:  No Known Allergies  VITALS:  Blood pressure 137/67, pulse 85, temperature 98.2 F (36.8 C), temperature source Oral, resp. rate 18, height 5\' 4"  (1.626 m), weight 56.5 kg (124 lb 9.6 oz), SpO2 97 %.  PHYSICAL EXAMINATION:  GENERAL:  82 y.o.-year-old patient lying in the bed with no acute distress.  EYES: Pupils equal, round, reactive to light and accommodation. No scleral icterus. Extraocular muscles intact.  HEENT: Head atraumatic, normocephalic. Oropharynx and nasopharynx clear.  NECK:  Supple, no jugular venous distention. No thyroid enlargement, no tenderness.  LUNGS: Normal breath sounds bilaterally, no wheezing, rales,rhonchi or crepitation. No use of accessory muscles of respiration.  CARDIOVASCULAR: S1, S2 normal. No murmurs, rubs, or  gallops.  ABDOMEN: Soft, nontender, nondistended. Bowel sounds present. No organomegaly or mass.  EXTREMITIES: No pedal edema, cyanosis, or clubbing.  NEUROLOGIC: Cranial nerves II through XII are intact. Muscle strength 4/5 in Both upper extremities. Lower extremity movements are limited due to fracture. Sensation intact. Gait not checked.  PSYCHIATRIC: The patient is alert and oriented x 3.  SKIN: No obvious rash, lesion, or ulcer.   Physical Exam LABORATORY PANEL:   CBC  Recent Labs Lab 06/29/17 0317  WBC 11.3*  HGB 11.5*  HCT 33.7*  PLT 270   ------------------------------------------------------------------------------------------------------------------  Chemistries   Recent Labs Lab 06/29/17 0317  NA 136  K 4.3  CL 102  CO2 25  GLUCOSE 196*  BUN 13  CREATININE 0.62  CALCIUM 8.8*   ------------------------------------------------------------------------------------------------------------------  Cardiac Enzymes No results for input(s): TROPONINI in the last 168 hours. ------------------------------------------------------------------------------------------------------------------  RADIOLOGY:  Dg Chest 1 View  Result Date: 06/28/2017 CLINICAL DATA:  LEFT hip fracture EXAM: CHEST 1 VIEW COMPARISON:  11/12/2014 FINDINGS: Enlargement of cardiac silhouette. Mediastinal contours and pulmonary vascularity normal. Lungs clear. No pleural effusion or pneumothorax. Marked osseous demineralization. IMPRESSION: Enlargement of cardiac silhouette without acute abnormalities. Electronically Signed   By: Ulyses SouthwardMark  Boles M.D.   On: 06/28/2017 18:13   Dg Hip Unilat With Pelvis 2-3 Views Left  Result Date: 06/28/2017 CLINICAL DATA:  LEFT hip pain, injury EXAM: DG HIP (WITH OR WITHOUT PELVIS) 2-3V LEFT COMPARISON:  05/11/2017 FINDINGS: Marked osseous demineralization. Components of a RIGHT hip prosthesis are identified without dislocation. Comminuted displaced intertrochanteric  fracture LEFT femur with varus angulation. No dislocation of the LEFT femoral head. SI joints symmetric.  Irregularities at the inferior pubic rami bilaterally question sequela of remote fractures, unchanged. IMPRESSION: Marked osseous demineralization. Comminuted intertrochanteric fracture LEFT femur varus angulation. Electronically Signed   By: Ulyses Southward M.D.   On: 06/28/2017 18:12    ASSESSMENT AND PLAN:   Active Problems:   Closed left hip fracture (HCC)   Pressure injury of skin  1. Preoperative cardiovascular examination-patient is a low risk for noncardiac surgery. -No contraindications to surgery at this time.  2. Status post fall and left hip fracture-orthopedics has been consulted. -To go for left hip surgery today.  3. Diabetes Type II without complication-hold metformin, glipizide. Place on sliding scale insulin.  4. History of Parkinson's disease-continue Sinemet.  5. Hypothyroidism-continue Synthroid.  6. GERD-continue Pepcid.  7. Hyperlipidemia-continue Pravachol.  8. Osteoporosis-continue calcium and vitamin D supplements.    All the records are reviewed and case discussed with Care Management/Social Workerr. Management plans discussed with the patient, family and they are in agreement.  CODE STATUS: Full  TOTAL TIME TAKING CARE OF THIS PATIENT: 35 minutes.   Discussed with her daughter in the room.  POSSIBLE D/C IN 1-2 DAYS, DEPENDING ON CLINICAL CONDITION.   Kara Moore M.D on 06/29/2017   Between 7am to 6pm - Pager - [phone]  After 6pm go to [email or web address] - password [email or web address]  [facility]  Office  [phone]  CC: Primary care physician; Karie Schwalbe, MD  Note: This dictation was prepared with Dragon dictation along with smaller phrase technology. Any transcriptional errors that result from this process are unintentional.

## 2017-06-29 NOTE — Anesthesia Post-op Follow-up Note (Signed)
Anesthesia QCDR form completed.        

## 2017-06-29 NOTE — OR Nursing (Signed)
Patient has a  diaper in place it is dry at this time

## 2017-06-29 NOTE — Op Note (Signed)
06/28/2017 - 06/29/2017  12:57 PM  Patient:   Kara Moore  Pre-Op Diagnosis:   Closed displaced three-part intertrochanteric fracture, left hip.  Post-Op Diagnosis:   Same.  Procedure:   Reduction and internal fixation of displaced three-part intertrochanteric left hip fracture with Biomet Affixis TFN nail.  Surgeon:   Maryagnes AmosJ. Jeffrey Danett Palazzo, MD  Assistant:   None  Anesthesia:   Spinal  Findings:   As above  Complications:   None  EBL:   50 cc  Fluids:   800 cc crystalloid  UOP:   None  TT:   None  Drains:   None  Closure:   Staples  Implants:   Biomet Affixis 11 x 380 mm TFN with a 95 mm lag screw and a 38 mm distal interlocking screw  Brief Clinical Note:   The patient is an 82 year old female who sustained the above-noted injury yesterday afternoon when she fell out of her wheelchair onto her left side. She was brought to the emergency room where x-rays demonstrated the above-noted fracture. The patient has been cleared medically and presents at this time for reduction and internal fixation of the displaced intertrochanteric left hip fracture.  Procedure:   The patient was brought into the operating room. After adequate spinal anesthesia was obtained, the patient was lain in the supine position on the fracture table. The uninjured leg was placed in a flexed and abducted position while the injured lower extremity was placed in longitudinal traction. The fracture was reduced using longitudinal traction and internal rotation. The adequacy of reduction was verified fluoroscopically in AP and lateral projections and found to be near anatomic. The lateral aspects of the left hip and thigh were prepped with ChloraPrep solution before being draped sterilely. Preoperative antibiotics were administered. A timeout was performed to verify the appropriate surgical site. The greater trochanter was identified fluoroscopically and an approximately 3 cm incision made about 2-3 fingerbreadths  above the tip of the greater trochanter. The incision was carried down through the subcutaneous tissues to expose the gluteal fascia. This was split the length of the incision, providing access to the tip of the trochanter. Under fluoroscopic guidance, a guidewire was drilled through the tip of the trochanter into the proximal metaphysis to the level of the lesser trochanter. After verifying its position fluoroscopically in AP and lateral projections, it was overreamed with the initial reamer to the depth of the lesser trochanter. A guidewire was passed down through the femoral canal to the supracondylar region. The adequacy of guidewire position was verified fluoroscopically in AP and lateral projections before the length of the guidewire within the canal was measured and found to be 405 mm. Therefore, a 380 mm length nail was selected. The guidewire was overreamed sequentially using the flexible reamers, beginning with a 10 mm reamer and progressing to a 12.5 mm reamer. This provided good cortical chatter. The 11 x 380 mm Biomet Affixis TFN rod was selected and advanced to the appropriate depth, as verified fluoroscopically.   The guide system for the lag screw was positioned and advanced through an approximately 2 cm stab incision over the lateral aspect of the proximal femur. The guidewire was drilled up through the trochanteric femoral nail and into the femoral neck to rest within 5 mm of subchondral bone. After verifying its position in the femoral neck and head in both AP and lateral projections, the guidewire was measured and found to be optimally replicated by a 95 mm lag screw. The guidewire was  overreamed to the appropriate depth before the lag screw was inserted and advanced to the appropriate depth as verified fluoroscopically in AP and lateral projections. The locking screw was advanced, then backed off a quarter turn to set the lag screw. Again the adequacy of hardware position and fracture  reduction was verified fluoroscopically in AP and lateral projections and found to be excellent.  Attention was directed distally. Using the "perfect circle" technique, the leg and fluoroscopy machine were positioned appropriately. An approximate 1.5 cm stab incision was made over the skin at the appropriate point before the drill bit was advanced through the cortex and across the static hole of the nail. The appropriate length of the screw was determined before the 38 mm distal interlocking screw was positioned, then advanced and tightened securely. Again the adequacy of screw position was verified fluoroscopically in AP and lateral projections and found to be excellent.  The wounds were irrigated thoroughly with sterile saline solution before the abductor fascia was reapproximated using #1 Vicryl interrupted sutures. The subcutaneous tissues were closed using 2-0 Vicryl interrupted sutures. The skin was closed using staples. A total of 30 cc of 0.5% Sensorcaine with epinephrine was injected in and around all incisions. Sterile occlusive dressings were applied to all wounds before the patient was transferred back to his/her hospital bed. The patient was then transferred to the recovery room in satisfactory condition after tolerating the procedure well.

## 2017-06-29 NOTE — Clinical Social Work Note (Signed)
CSW received consult for SNF placement. CSW will follow pending PT recommendations.  Christean Silvestri Martha Izzy Doubek, MSW, LCSWA [phone] 

## 2017-06-29 NOTE — Transfer of Care (Signed)
Immediate Anesthesia Transfer of Care Note  Patient: Kara Moore  Procedure(s) Performed: Procedure(s): INTRAMEDULLARY (IM) NAIL INTERTROCHANTRIC (Left)  Patient Location: PACU  Anesthesia Type:Spinal  Level of Consciousness: awake  Airway & Oxygen Therapy: Patient connected to face mask oxygen  Post-op Assessment: Post -op Vital signs reviewed and stable  Post vital signs: stable  Last Vitals:  Vitals:   06/29/17 0726 06/29/17 1301  BP: 137/67 (!) 106/54  Pulse: 85 90  Resp: 18 16  Temp: 36.8 C   SpO2: 97% 100%    Last Pain:  Vitals:   06/29/17 0726  TempSrc: Oral         Complications: No apparent anesthesia complications

## 2017-06-30 ENCOUNTER — Encounter: Payer: Self-pay | Admitting: Surgery

## 2017-06-30 LAB — CBC WITH DIFFERENTIAL/PLATELET
BASOS ABS: 0 10*3/uL (ref 0–0.1)
BASOS PCT: 0 %
EOS ABS: 0 10*3/uL (ref 0–0.7)
Eosinophils Relative: 1 %
HEMATOCRIT: 28.5 % — AB (ref 35.0–47.0)
HEMOGLOBIN: 9.8 g/dL — AB (ref 12.0–16.0)
Lymphocytes Relative: 10 %
Lymphs Abs: 0.9 10*3/uL — ABNORMAL LOW (ref 1.0–3.6)
MCH: 30.4 pg (ref 26.0–34.0)
MCHC: 34.3 g/dL (ref 32.0–36.0)
MCV: 88.7 fL (ref 80.0–100.0)
MONOS PCT: 7 %
Monocytes Absolute: 0.6 10*3/uL (ref 0.2–0.9)
NEUTROS ABS: 7.3 10*3/uL — AB (ref 1.4–6.5)
NEUTROS PCT: 82 %
Platelets: 233 10*3/uL (ref 150–440)
RBC: 3.21 MIL/uL — ABNORMAL LOW (ref 3.80–5.20)
RDW: 15.2 % — ABNORMAL HIGH (ref 11.5–14.5)
WBC: 8.9 10*3/uL (ref 3.6–11.0)

## 2017-06-30 LAB — URINALYSIS, COMPLETE (UACMP) WITH MICROSCOPIC
Bilirubin Urine: NEGATIVE
GLUCOSE, UA: NEGATIVE mg/dL
Hgb urine dipstick: NEGATIVE
Ketones, ur: 15 mg/dL — AB
Leukocytes, UA: NEGATIVE
NITRITE: POSITIVE — AB
PH: 5.5 (ref 5.0–8.0)
Protein, ur: 30 mg/dL — AB
RBC / HPF: NONE SEEN RBC/hpf (ref 0–5)
SPECIFIC GRAVITY, URINE: 1.025 (ref 1.005–1.030)

## 2017-06-30 LAB — GLUCOSE, CAPILLARY
GLUCOSE-CAPILLARY: 170 mg/dL — AB (ref 65–99)
GLUCOSE-CAPILLARY: 190 mg/dL — AB (ref 65–99)
Glucose-Capillary: 152 mg/dL — ABNORMAL HIGH (ref 65–99)
Glucose-Capillary: 173 mg/dL — ABNORMAL HIGH (ref 65–99)

## 2017-06-30 LAB — BASIC METABOLIC PANEL
ANION GAP: 7 (ref 5–15)
BUN: 12 mg/dL (ref 6–20)
CALCIUM: 8.5 mg/dL — AB (ref 8.9–10.3)
CHLORIDE: 107 mmol/L (ref 101–111)
CO2: 24 mmol/L (ref 22–32)
CREATININE: 0.52 mg/dL (ref 0.44–1.00)
GFR calc non Af Amer: >60 mL/min (ref >60)
Glucose, Bld: 175 mg/dL — ABNORMAL HIGH (ref 65–99)
Potassium: 3.9 mmol/L (ref 3.5–5.1)
SODIUM: 138 mmol/L (ref 135–145)

## 2017-06-30 NOTE — NC FL2 (Signed)
[place] MEDICAID FL2 LEVEL OF CARE SCREENING TOOL     IDENTIFICATION  Patient Name: Kara Moore Birthdate: 14-Dec-1934 Sex: female Admission Date (Current Location): 06/28/2017  [place] and [place] Number:  Chiropodist and Address:  [facility], [address], [place], [place] [address]      Provider Number: [id number]  Attending Physician Name and Address:  Altamese Dilling, *  Relative Name and Phone Number:       Current Level of Care: Hospital Recommended Level of Care: Skilled Nursing Facility Prior Approval Number:    Date Approved/Denied:   PASRR Number:  ([id number] )  Discharge Plan: SNF    Current Diagnoses: Patient Active Problem List   Diagnosis Date Noted  . Pressure injury of skin 06/29/2017  . Closed left hip fracture (HCC) 06/28/2017  . Hip fracture, unspecified laterality, closed, initial encounter (HCC) 05/11/2017  . Hip fracture (HCC) 05/11/2017  . Mixed incontinence urge and stress 12/12/2016  . Primary osteoarthritis of both knees 07/21/2014  . Essential hypertension, benign 07/21/2014  . Other and unspecified hyperlipidemia 07/21/2014  . Osteoarthritis of right knee 01/31/2014  . Diabetes mellitus type 2, uncomplicated (HCC) 01/17/2014  . Right calf pain 01/17/2014  . Parkinson's disease (tremor, stiffness, slow motion, unstable posture) (HCC) 10/19/2013  . Memory loss   . Hypothyroidism   . Macular degeneration   . Senile osteoporosis   . Hyperlipidemia   . Recurrent falls 06/17/2013    Orientation RESPIRATION BLADDER Height & Weight     Self  Normal Incontinent Weight: 124 lb 9.6 oz (56.5 kg) Height:  5\' 4"  (162.6 cm)  BEHAVIORAL SYMPTOMS/MOOD NEUROLOGICAL BOWEL NUTRITION STATUS   (none)  (none) Continent Diet (Diet: Heart Healthy/ Carb Modified. )  AMBULATORY STATUS COMMUNICATION OF NEEDS Skin   Extensive Assist Verbally Surgical wounds, PU Stage and Appropriate Care (Pressure Ulcer on  Coccyx. )                       Personal Care Assistance Level of Assistance  Bathing, Feeding, Dressing Bathing Assistance: Limited assistance Feeding assistance: Independent Dressing Assistance: Limited assistance     Functional Limitations Info  Sight, Hearing, Speech Sight Info: Adequate Hearing Info: Adequate Speech Info: Adequate    SPECIAL CARE FACTORS FREQUENCY  PT (By licensed PT), OT (By licensed OT)     PT Frequency:  (5) OT Frequency:  (5)            Contractures      Additional Factors Info  Code Status, Allergies Code Status Info:  (Full Code. ) Allergies Info:  (No Known Allergies. )           Current Medications (06/30/2017):  This is the current hospital active medication list Current Facility-Administered Medications  Medication Dose Route Frequency Provider Last Rate Last Dose  . 0.9 % NaCl with KCl 20 mEq/ L  infusion   Intravenous Continuous Poggi, Excell Seltzer, MD   Stopped at 06/30/17 0757  . acetaminophen (TYLENOL) tablet 650 mg  650 mg Oral Q6H PRN Poggi, Excell Seltzer, MD       Or  . acetaminophen (TYLENOL) suppository 650 mg  650 mg Rectal Q6H PRN Poggi, Excell Seltzer, MD      . acetaminophen (TYLENOL) tablet 1,000 mg  1,000 mg Oral Q6H Poggi, Excell Seltzer, MD   1,000 mg at 06/30/17 0825  . alum & mag hydroxide-simeth (MAALOX/MYLANTA) 200-200-20 MG/5ML suspension 30 mL  30 mL Oral  Q6H PRN [place] SirenSainani, Vivek J, MD      . beta carotene w/minerals (OCUVITE) tablet 1 tablet  1 tablet Oral Daily Sainani, Rolly PancakeVivek J, MD      . bisacodyl (DULCOLAX) suppository 10 mg  10 mg Rectal Daily PRN Poggi, Excell SeltzerJohn J, MD   10 mg at 06/29/17 2219  . calcium carbonate (TUMS - dosed in mg elemental calcium) chewable tablet 500 mg of elemental calcium  500 mg of elemental calcium Oral Daily [place] SirenSainani, Vivek J, MD   500 mg of elemental calcium at 06/30/17 1129  . carbidopa-levodopa (SINEMET IR) 25-100 MG per tablet immediate release 1 tablet  1 tablet Oral QID Houston SirenSainani, Vivek J, MD   1 tablet at  06/30/17 [phone]  . diphenhydrAMINE (BENADRYL) 12.5 MG/5ML elixir 12.5-25 mg  12.5-25 mg Oral Q4H PRN Poggi, Excell SeltzerJohn J, MD      . docusate sodium (COLACE) capsule 100 mg  100 mg Oral BID Poggi, Excell SeltzerJohn J, MD   100 mg at 06/30/17 1128  . enoxaparin (LOVENOX) injection 40 mg  40 mg Subcutaneous Q24H Poggi, Excell SeltzerJohn J, MD   40 mg at 06/30/17 0825  . fluticasone (FLONASE) 50 MCG/ACT nasal spray 2 spray  2 spray Each Nare Daily Sainani, Vivek J, MD      . furosemide (LASIX) tablet 10 mg  10 mg Oral Daily [place] SirenSainani, Vivek J, MD   10 mg at 06/30/17 1128  . guaiFENesin (ROBITUSSIN) 100 MG/5ML solution 200 mg  200 mg Oral QID PRN Sainani, Rolly PancakeVivek J, MD      . insulin aspart (novoLOG) injection 0-5 Units  0-5 Units Subcutaneous QHS Sainani, Vivek J, MD      . insulin aspart (novoLOG) injection 0-9 Units  0-9 Units Subcutaneous TID WC Houston SirenSainani, Vivek J, MD   2 Units at 06/30/17 [phone]  . levothyroxine (SYNTHROID, LEVOTHROID) tablet 50 mcg  50 mcg Oral QAC breakfast Houston SirenSainani, Vivek J, MD   50 mcg at 06/30/17 0825  . magnesium hydroxide (MILK OF MAGNESIA) suspension 30 mL  30 mL Oral Daily PRN Poggi, Excell SeltzerJohn J, MD      . memantine [facility](NAMENDA) tablet 5 mg  5 mg Oral BID Houston SirenSainani, Vivek J, MD   5 mg at 06/30/17 1128  . metoCLOPramide (REGLAN) tablet 5-10 mg  5-10 mg Oral Q8H PRN Poggi, Excell SeltzerJohn J, MD       Or  . metoCLOPramide (REGLAN) injection 5-10 mg  5-10 mg Intravenous Q8H PRN Poggi, Excell SeltzerJohn J, MD      . morphine 2 MG/ML injection 1-2 mg  1-2 mg Intravenous Q2H PRN Poggi, Excell SeltzerJohn J, MD      . ondansetron (ZOFRAN) tablet 4 mg  4 mg Oral Q6H PRN Poggi, Excell SeltzerJohn J, MD       Or  . ondansetron (ZOFRAN) injection 4 mg  4 mg Intravenous Q6H PRN Poggi, Excell SeltzerJohn J, MD      . oxyCODONE (Oxy IR/ROXICODONE) immediate release tablet 5-10 mg  5-10 mg Oral Q3H PRN Poggi, Excell SeltzerJohn J, MD   10 mg at 06/30/17 0457  . pantoprazole (PROTONIX) EC tablet 40 mg  40 mg Oral Daily Poggi, Excell SeltzerJohn J, MD   40 mg at 06/30/17 1129  . potassium chloride (K-DUR,KLOR-CON) CR tablet 10 mEq   10 mEq Oral Daily [place] SirenSainani, Vivek J, MD   10 mEq at 06/30/17 1128  . pravastatin (PRAVACHOL) tablet 80 mg  80 mg Oral Daily [place] SirenSainani, Vivek J, MD   80 mg at 06/30/17 1127  . sodium phosphate (  FLEET) 7-19 GM/118ML enema 1 enema  1 enema Rectal Once PRN Poggi, Excell Seltzer, MD      . traMADol Janean Sark) tablet 50 mg  50 mg Oral Q12H PRN Houston Siren, MD   50 mg at 06/29/17 0831     Discharge Medications: Please see discharge summary for a list of discharge medications.  Relevant Imaging Results:  Relevant Lab Results:   Additional Information  (SSN: [id number])  Sample, Darleen Crocker, LCSW

## 2017-06-30 NOTE — Clinical Social Work Note (Signed)
Clinical Social Work Assessment  Patient Details  Name: Kara Moore MRN: [id number] Date of Birth: 03/26/1935  Date of referral:  06/30/17               Reason for consult:  Other (Comment Required) ([facility] )                Permission sought to share information with:  Oceanographeracility Contact Representative Permission granted to share information::  Yes, Verbal Permission Granted  Name::      [facility]  Agency::   Skilled Nursing Facility   Relationship::     Contact Information:     Housing/Transportation Living arrangements for the past 2 months:  Skilled Building surveyorursing Facility Source of Information:  Other (Comment Required) (Niece Kara Moore ) Patient Interpreter Needed:  None Criminal Activity/Legal Involvement Pertinent to Current Situation/Hospitalization:  No - Comment as needed Significant Relationships:  Other Family Members Lives with:  Facility Resident Do you feel safe going back to the place where you live?  Yes Need for family participation in patient care:  Yes (Comment)  Care giving concerns:  Patient is a [facility].    Social Worker assessment / plan:  Visual merchandiserClinical Social Worker (CSW) reviewed chart and noted that [facility] is [facility]. Per Kara Moore admissions coordinator at [facility] patient is a long term care resident on the SNF side and can return when stable. Per MD patient will likely D/C tomorrow. CSW attempted to meet with patient however she was not alert and oriented. CSW contacted patient's niece/ HPOA Kara Moore. Per Kara SineNancy she and patient's other niece Kara Moore share HPOA. Per Kara SineNancy patient has [facility] June 2018 due to a previous hip fracture. Per niece patient is paying privately for SNF now. CSW explained that if patient progresses with PT and has a 3 night qualifying inpatient stay at a hospital then medicare will pay for short term rehab. Patient was admitted to inpatient on 06/28/17. Niece verbalized  her understanding and is agreeable for patient to return to [facility] when stable. FL2 complete. CSW will continue to follow and assist as needed.   Employment status:  Retired Health and safety inspectornsurance information:  Medicare PT Recommendations:  Skilled Nursing Facility Information / Referral to community resources:  Skilled Nursing Facility  Patient/Family's Response to care:  Patient's niece Kara Moore is agreeable for patient to return [facility].   Patient/Family's Understanding of and Emotional Response to Diagnosis, Current Treatment, and Prognosis:  Niece was very pleasant and thanked CSW for calling.   Emotional Assessment Appearance:  Appears stated age Attitude/Demeanor/Rapport:  Unable to Assess Affect (typically observed):  Unable to Assess Orientation:  Oriented to Self, Fluctuating Orientation (Suspected and/or reported Sundowners) Alcohol / Substance use:  Not Applicable Psych involvement (Current and /or in the community):  No (Comment)  Discharge Needs  Concerns to be addressed:  Discharge Planning Concerns Readmission within the last 30 days:  No Current discharge risk:  Dependent with Mobility, Cognitively Impaired, Chronically ill Barriers to Discharge:  Continued Medical Work up   [employer], [name], LCSW 06/30/2017, 1:02 PM

## 2017-06-30 NOTE — Care Management (Signed)
Medicare Bundle patient; notified Miquel DunnJudy Young with [facility].

## 2017-06-30 NOTE — Evaluation (Signed)
Physical Therapy Evaluation Patient Details Name: Kara Moore MRN: [id number] DOB: 03-Nov-1935 Today's Date: 06/30/2017   History of Present Illness  presented to ER secondary to mechanical fall; admitted with L intertrochanteric hip fracture, status post ORIF with nailing (06/29/17).  Of note, patient with recent R hip fracture status post hemiarthroplasty (05/11/17), WBAT, posterior THPs.  Clinical Impression  Upon evaluation, patient alert; oriented to self only.  Generally confused with noted delay in processing and task initiation.  Requiring max cuing/assist from therapist to complete all therex and functional activities.  Generally guarded in L LE-question pain vs tone/rigidity? With limited tolerance for ROM (approx 25% normal).  Currently requiring max assist+2 for bed mobility; min/mod assist for unsupported sitting balance; max assist +2 with RW for sit/stand and standing balance (from elevated bed surface).  Very heavy posterior weight shift/extensor thrust with all sitting/standing activities, poor righting reactions.  Unsafe/unable to attempt additional mobility at this time. Would benefit from skilled PT to address above deficits and promote optimal return to PLOF; recommend transition to STR upon discharge from acute hospitalization.     Follow Up Recommendations SNF    Equipment Recommendations       Recommendations for Other Services       Precautions / Restrictions Precautions Precautions: Fall Restrictions Weight Bearing Restrictions: Yes LLE Weight Bearing: Weight bearing as tolerated      Mobility  Bed Mobility Overal bed mobility: Needs Assistance Bed Mobility: Supine to Sit;Sit to Supine     Supine to sit: Max assist Sit to supine: Total assist;+2 for physical assistance      Transfers Overall transfer level: Needs assistance Equipment used: Rolling walker (2 wheeled) Transfers: Sit to/from Stand Sit to Stand: Max assist;+2 physical assistance          General transfer comment: heavy posterior weight shift, resistant to anterior weight translation  Ambulation/Gait             General Gait Details: unsafe/unable  Stairs            Wheelchair Mobility    Modified Rankin (Stroke Patients Only)       Balance Overall balance assessment: Needs assistance Sitting-balance support: No upper extremity supported;Feet supported Sitting balance-Leahy Scale: Poor Sitting balance - Comments: nearly fixed thoracic, lumbar flexion with significant posterior pelvic tilt Postural control: Posterior lean Standing balance support: Bilateral upper extremity supported Standing balance-Leahy Scale: Zero Standing balance comment: very heavy posterior weight shift, extensor thrust with exertion                             Pertinent Vitals/Pain Pain Assessment: Faces Faces Pain Scale: Hurts even more Pain Location: L hip Pain Descriptors / Indicators: Aching;Grimacing;Guarding Pain Intervention(s): Limited activity within patient's tolerance;Monitored during session;Repositioned    Home Living Family/patient expects to be discharged to:: Skilled nursing facility                 Additional Comments: Resident of [facility] x4 years; single story with no steps required, comfort height toilet w/ bilat hand rails    Prior Function Level of Independence: Needs assistance         Comments: At baseline (prior to initial R hip fracture), patient was mod indep with RW at indep living facility; has been very minimally ambulatory since, requiring use of total lift for basic transfers.     Hand Dominance   Dominant Hand: Right    Extremity/Trunk  Assessment   Upper Extremity Assessment Upper Extremity Assessment: Generalized weakness    Lower Extremity Assessment Lower Extremity Assessment: Generalized weakness (R LE grossly 3-/5, L LE grossly 2+ to 3-/5.  Bilat ankles to neutral.  L LE generally  guarded due to pain (question some degree of tone/rigidity?))       Communication   Communication: No difficulties  Cognition Arousal/Alertness: Lethargic Behavior During Therapy:  (delayed response) Overall Cognitive Status: History of cognitive impairments - at baseline                                 General Comments: oriented to self only; following simple commands with significant increase time for processing      General Comments      Exercises Other Exercises Other Exercises: Supine LE therex, 1x10, act assist ROM to bilat LEs: ankle pumps, heel slides, hip abduct/adduct.  Unable to actively initiate/complete formal ROM/therex without max cuing/assist from therapist.  L LE very guarded-question tone/rigidity vs guarding? Other Exercises: Unsupported sitting balance, attempted to complete alternate UE reaching to promote anterior weight translation. Generates very minimal lumbar extesion/ant pelvic tilt; unable to complete weight shift beyond neutral sitting posture Other Exercises: Sit/stand x3 with RW, max assist +2 from elevated bed surface. Very heavy posterior weight shift/extensor thrust; poor balance/righting. Unable to achieve neutral standing posture.   Assessment/Plan    PT Assessment Patient needs continued PT services  PT Problem List Decreased strength;Decreased range of motion;Decreased activity tolerance;Decreased balance;Decreased mobility;Decreased coordination;Decreased cognition;Decreased knowledge of use of DME;Decreased safety awareness;Decreased knowledge of precautions;Pain;Decreased skin integrity       PT Treatment Interventions DME instruction;Gait training;Functional mobility training;Therapeutic activities;Patient/family education;Balance training;Therapeutic exercise    PT Goals (Current goals can be found in the Care Plan section)  Acute Rehab PT Goals PT Goal Formulation: Patient unable to participate in goal setting Time For Goal  Achievement: 07/14/17 Potential to Achieve Goals: Fair    Frequency 7X/week   Barriers to discharge Decreased caregiver support      Co-evaluation               AM-PAC PT "6 Clicks" Daily Activity  Outcome Measure Difficulty turning over in bed (including adjusting bedclothes, sheets and blankets)?: Total Difficulty moving from lying on back to sitting on the side of the bed? : Total Difficulty sitting down on and standing up from a chair with arms (e.g., wheelchair, bedside commode, etc,.)?: Total Help needed moving to and from a bed to chair (including a wheelchair)?: Total Help needed walking in hospital room?: Total Help needed climbing 3-5 steps with a railing? : Total 6 Click Score: 6    End of Session Equipment Utilized During Treatment: Gait belt Activity Tolerance: Patient tolerated treatment well Patient left: in bed;with call bell/phone within reach;with nursing/sitter in room (nursing at bedside to replace ext female cath; to connect alarm when complete) Nurse Communication: Mobility status PT Visit Diagnosis: Unsteadiness on feet (R26.81);Muscle weakness (generalized) (M62.81);Difficulty in walking, not elsewhere classified (R26.2);Pain Pain - Right/Left: Left Pain - part of body: Hip    Time: 0920-0947 PT Time Calculation (min) (ACUTE ONLY): 27 min   Charges:   PT Evaluation $PT Eval Moderate Complexity: 1 Mod PT Treatments $Therapeutic Exercise: 8-22 mins   PT G Codes:   PT G-Codes **NOT FOR INPATIENT CLASS** Functional Assessment Tool Used: AM-PAC 6 Clicks Basic Mobility Functional Limitation: Mobility: Walking and moving around Mobility: Walking and  Moving Around Current Status [phone]): 100 percent impaired, limited or restricted Mobility: Walking and Moving Around Goal Status [phone]): At least 20 percent but less than 40 percent impaired, limited or restricted    Shaquia Berkley H. Manson Passey, PT, DPT, NCS 06/30/17, 10:22 AM [phone]

## 2017-06-30 NOTE — Progress Notes (Signed)
[facility] [place] [facility]   PATIENT NAME: Kara Moore    MR#:  [id number]  DATE OF BIRTH:  07/03/1935  SUBJECTIVE:  CHIEF COMPLAINT:   Chief Complaint  Patient presents with  . Fall  Has significant Parkinson's disease, 2 months ago head fracture on the right hip and after surgery sent to a rehabilitation center where she did not have much recovery and now completely wheelchair bound. Try to get up from her lift chair and fell with fracture on the left hip this time. No new complaints.  pt is somewhat confused this morning,  REVIEW OF SYSTEMS:  CONSTITUTIONAL: No fever, fatigue or weakness.  EYES: No blurred or double vision.  EARS, NOSE, AND THROAT: No tinnitus or ear pain.  RESPIRATORY: No cough, shortness of breath, wheezing or hemoptysis.  CARDIOVASCULAR: No chest pain, orthopnea, edema.  GASTROINTESTINAL: No nausea, vomiting, diarrhea or abdominal pain.  GENITOURINARY: No dysuria, hematuria.  ENDOCRINE: No polyuria, nocturia,  HEMATOLOGY: No anemia, easy bruising or bleeding SKIN: No rash or lesion. MUSCULOSKELETAL: Left hip pain.   NEUROLOGIC: No tingling, numbness, weakness.  PSYCHIATRY: No anxiety or depression.   ROS  DRUG ALLERGIES:  No Known Allergies  VITALS:  Blood pressure 124/64, pulse 88, temperature 99.3 F (37.4 C), temperature source Oral, resp. rate 18, height 5\' 4"  (1.626 m), weight 56.5 kg (124 lb 9.6 oz), SpO2 94 %.  PHYSICAL EXAMINATION:  GENERAL:  82 y.o.-year-old patient lying in the bed with no acute distress.  EYES: Pupils equal, round, reactive to light and accommodation. No scleral icterus. Extraocular muscles intact.  HEENT: Head atraumatic, normocephalic. Oropharynx and nasopharynx clear.  NECK:  Supple, no jugular venous distention. No thyroid enlargement, no tenderness.  LUNGS: Normal breath sounds bilaterally, no wheezing, rales,rhonchi or crepitation. No use of accessory muscles of respiration.   CARDIOVASCULAR: S1, S2 normal. No murmurs, rubs, or gallops.  ABDOMEN: Soft, nontender, nondistended. Bowel sounds present. No organomegaly or mass. External urinary catheter present. EXTREMITIES: No pedal edema, cyanosis, or clubbing.  NEUROLOGIC: Cranial nerves II through XII are intact. Muscle strength 4/5 in Both upper extremities. Lower extremity movements are limited due to fracture. Sensation intact. Gait not checked.  PSYCHIATRIC: The patient is alert and oriented x 3.  SKIN: No obvious rash, lesion, or ulcer.   Physical Exam LABORATORY PANEL:   CBC  Recent Labs Lab 06/30/17 0413  WBC 8.9  HGB 9.8*  HCT 28.5*  PLT 233   ------------------------------------------------------------------------------------------------------------------  Chemistries   Recent Labs Lab 06/30/17 0413  NA 138  K 3.9  CL 107  CO2 24  GLUCOSE 175*  BUN 12  CREATININE 0.52  CALCIUM 8.5*   ------------------------------------------------------------------------------------------------------------------  Cardiac Enzymes No results for input(s): TROPONINI in the last 168 hours. ------------------------------------------------------------------------------------------------------------------  RADIOLOGY:  Dg Chest 1 View  Result Date: 06/28/2017 CLINICAL DATA:  LEFT hip fracture EXAM: CHEST 1 VIEW COMPARISON:  11/12/2014 FINDINGS: Enlargement of cardiac silhouette. Mediastinal contours and pulmonary vascularity normal. Lungs clear. No pleural effusion or pneumothorax. Marked osseous demineralization. IMPRESSION: Enlargement of cardiac silhouette without acute abnormalities. Electronically Signed   By: Ulyses SouthwardMark  Boles M.D.   On: 06/28/2017 18:13   Dg Hip Operative Unilat W Or W/o Pelvis Left  Result Date: 06/29/2017 CLINICAL DATA:  Operative fixation of a left intertrochanteric fracture. EXAM: OPERATIVE LEFT HIP (WITH PELVIS IF PERFORMED) 5 VIEWS TECHNIQUE: Fluoroscopic spot image(s) were  submitted for interpretation post-operatively. COMPARISON:  06/28/2017. FINDINGS: Interval compression screw and rod fixation of the  previously demonstrated left intertrochanteric fracture. There is posterior displacement of the distal fragment on the lateral views. Essentially anatomic position and alignment on the frontal view. IMPRESSION: Hardware fixation of the previously demonstrated left intertrochanteric fracture. Electronically Signed   By: Beckie Salts M.D.   On: 06/29/2017 15:29   Dg Hip Unilat With Pelvis 2-3 Views Left  Result Date: 06/28/2017 CLINICAL DATA:  LEFT hip pain, injury EXAM: DG HIP (WITH OR WITHOUT PELVIS) 2-3V LEFT COMPARISON:  05/11/2017 FINDINGS: Marked osseous demineralization. Components of a RIGHT hip prosthesis are identified without dislocation. Comminuted displaced intertrochanteric fracture LEFT femur with varus angulation. No dislocation of the LEFT femoral head. SI joints symmetric. Irregularities at the inferior pubic rami bilaterally question sequela of remote fractures, unchanged. IMPRESSION: Marked osseous demineralization. Comminuted intertrochanteric fracture LEFT femur varus angulation. Electronically Signed   By: Ulyses Southward M.D.   On: 06/28/2017 18:12    ASSESSMENT AND PLAN:   Active Problems:   Closed left hip fracture (HCC)   Pressure injury of skin  * Status post fall and left hip fracture-orthopedics has been consulted. - left intramedullary nail 06/29/17.  * Diabetes Type II without complication-hold metformin, glipizide. Place on sliding scale insulin.  * History of Parkinson's disease-continue Sinemet.  * Hypothyroidism-continue Synthroid.  * GERD-continue Pepcid.  * Hyperlipidemia-continue Pravachol.  * Osteoporosis-continue calcium and vitamin D supplements.   All the records are reviewed and case discussed with Care Management/Social Workerr. Management plans discussed with the patient, family and they are in  agreement.  CODE STATUS: Full  TOTAL TIME TAKING CARE OF THIS PATIENT: 35 minutes.   Discussed with her daughter in the room.  POSSIBLE D/C IN 1-2 DAYS, DEPENDING ON CLINICAL CONDITION.   Altamese Dilling M.D on 06/30/2017   Between 7am to 6pm - Pager - [phone]  After 6pm go to [email or web address] - password [email or web address]  [facility]  Office  [phone]  CC: Primary care physician; Karie Schwalbe, MD  Note: This dictation was prepared with Dragon dictation along with smaller phrase technology. Any transcriptional errors that result from this process are unintentional.

## 2017-06-30 NOTE — Progress Notes (Signed)
Subjective: 1 Day Post-Op Procedure(s) (LRB): INTRAMEDULLARY (IM) NAIL INTERTROCHANTRIC (Left) Patient reports pain as mild.   Patient is confused this AM, states that "people keep telling me that I broke my hip". PT and Care management to assist with discharge planning. Negative for chest pain and shortness of breath Fever: Low grade temps last night, 99. Gastrointestinal:Negative for nausea and vomiting  Objective: Vital signs in last 24 hours: Temp:  [97.1 F (36.2 C)-99 F (37.2 C)] 98.6 F (37 C) (08/13 0455) Pulse Rate:  [72-97] 86 (08/13 0355) Resp:  [16-18] 18 (08/13 0355) BP: (92-137)/(45-89) 136/62 (08/13 0355) SpO2:  [91 %-100 %] 93 % (08/13 0355)  Intake/Output from previous day:  Intake/Output Summary (Last 24 hours) at 06/30/17 0718 Last data filed at 06/30/17 0644  Gross per 24 hour  Intake           1862.5 ml  Output             1075 ml  Net            787.5 ml    Intake/Output this shift: No intake/output data recorded.  Labs:  Recent Labs  06/28/17 1803 06/29/17 0317 06/30/17 0413  HGB 12.7 11.5* 9.8*    Recent Labs  06/29/17 0317 06/30/17 0413  WBC 11.3* 8.9  RBC 3.78* 3.21*  HCT 33.7* 28.5*  PLT 270 233    Recent Labs  06/29/17 0317 06/30/17 0413  NA 136 138  K 4.3 3.9  CL 102 107  CO2 25 24  BUN 13 12  CREATININE 0.62 0.52  GLUCOSE 196* 175*  CALCIUM 8.8* 8.5*    Recent Labs  06/28/17 1803  INR 0.98     EXAM General - Patient is Confused and Lacking Extremity - ABD soft Sensation intact distally Intact pulses distally Dorsiflexion/Plantar flexion intact Incision: dressing C/D/I Dressing/Incision - clean, dry, no drainage Motor Function - intact, moving foot and toes well on exam.   Abdomen soft with normal BS.  Past Medical History:  Diagnosis Date  . Benign essential hypertension   . Confusion   . Diabetes mellitus without complication (HCC)   . Hyperlipidemia LDL goal < 100   . Hypothyroidism   .  Macular degeneration   . Memory loss   . Senile osteoporosis     Assessment/Plan: 1 Day Post-Op Procedure(s) (LRB): INTRAMEDULLARY (IM) NAIL INTERTROCHANTRIC (Left) Active Problems:   Closed left hip fracture (HCC)   Pressure injury of skin  Estimated body mass index is 21.39 kg/m as calculated from the following:   Height as of this encounter: 5\' 4"  (1.626 m).   Weight as of this encounter: 56.5 kg (124 lb 9.6 oz). Advance diet Up with therapy D/C IV fluids when tolerating po intake.  Labs reviewed, Hg 9.8 this AM.  CBC and BMP ordered for tomorrow morning. Up with therapy today, will likely need SNF upon discharge when medically appropriate. Begin working on having a bowel movement today. Monitor pain medication closely to ensure that it does not worsen dementia. Foley can be removed today.  DVT Prophylaxis - Lovenox, Foot Pumps and TED hose Weight-Bearing as tolerated to left leg  J. Horris LatinoLance McGhee, PA-C [facility] Orthopaedic Surgery 06/30/2017, 7:18 AM

## 2017-06-30 NOTE — Anesthesia Postprocedure Evaluation (Signed)
Anesthesia Post Note  Patient: Yoshino E Bucker  Procedure(s) Performed: Procedure(s) (LRB): INTRAMEDULLARY (IM) NAIL INTERTROCHANTRIC (Left)  Patient location during evaluation: Nursing Unit Anesthesia Type: Spinal Level of consciousness: awake, awake and alert, oriented and patient cooperative Pain management: pain level controlled Vital Signs Assessment: post-procedure vital signs reviewed and stable Respiratory status: spontaneous breathing, nonlabored ventilation and respiratory function stable Cardiovascular status: stable Postop Assessment: no headache, no backache, patient able to bend at knees and no signs of nausea or vomiting Anesthetic complications: no     Last Vitals:  Vitals:   06/30/17 0355 06/30/17 0455  BP: 136/62   Pulse: 86   Resp: 18   Temp: 37.2 C 37 C  SpO2: 93%     Last Pain:  Vitals:   06/29/17 2100  TempSrc: Oral  PainSc:                  Lyn RecordsNoles,  Lamel Mccarley R

## 2017-07-01 ENCOUNTER — Inpatient Hospital Stay: Payer: Medicare Other

## 2017-07-01 DIAGNOSIS — E119 Type 2 diabetes mellitus without complications: Secondary | ICD-10-CM | POA: Diagnosis not present

## 2017-07-01 DIAGNOSIS — R6889 Other general symptoms and signs: Secondary | ICD-10-CM | POA: Diagnosis not present

## 2017-07-01 DIAGNOSIS — M25512 Pain in left shoulder: Secondary | ICD-10-CM | POA: Diagnosis not present

## 2017-07-01 DIAGNOSIS — S72001D Fracture of unspecified part of neck of right femur, subsequent encounter for closed fracture with routine healing: Secondary | ICD-10-CM | POA: Diagnosis not present

## 2017-07-01 DIAGNOSIS — M7582 Other shoulder lesions, left shoulder: Secondary | ICD-10-CM | POA: Diagnosis not present

## 2017-07-01 DIAGNOSIS — Z96641 Presence of right artificial hip joint: Secondary | ICD-10-CM | POA: Diagnosis not present

## 2017-07-01 DIAGNOSIS — F0390 Unspecified dementia without behavioral disturbance: Secondary | ICD-10-CM | POA: Diagnosis not present

## 2017-07-01 DIAGNOSIS — S72143A Displaced intertrochanteric fracture of unspecified femur, initial encounter for closed fracture: Secondary | ICD-10-CM | POA: Diagnosis not present

## 2017-07-01 DIAGNOSIS — H353221 Exudative age-related macular degeneration, left eye, with active choroidal neovascularization: Secondary | ICD-10-CM | POA: Diagnosis not present

## 2017-07-01 DIAGNOSIS — E039 Hypothyroidism, unspecified: Secondary | ICD-10-CM | POA: Diagnosis not present

## 2017-07-01 DIAGNOSIS — R296 Repeated falls: Secondary | ICD-10-CM | POA: Diagnosis not present

## 2017-07-01 DIAGNOSIS — S72002A Fracture of unspecified part of neck of left femur, initial encounter for closed fracture: Secondary | ICD-10-CM | POA: Diagnosis not present

## 2017-07-01 DIAGNOSIS — I10 Essential (primary) hypertension: Secondary | ICD-10-CM | POA: Diagnosis not present

## 2017-07-01 DIAGNOSIS — S72142A Displaced intertrochanteric fracture of left femur, initial encounter for closed fracture: Secondary | ICD-10-CM | POA: Diagnosis not present

## 2017-07-01 DIAGNOSIS — Z7401 Bed confinement status: Secondary | ICD-10-CM | POA: Diagnosis not present

## 2017-07-01 DIAGNOSIS — R2681 Unsteadiness on feet: Secondary | ICD-10-CM | POA: Diagnosis not present

## 2017-07-01 DIAGNOSIS — M6281 Muscle weakness (generalized): Secondary | ICD-10-CM | POA: Diagnosis not present

## 2017-07-01 DIAGNOSIS — Z471 Aftercare following joint replacement surgery: Secondary | ICD-10-CM | POA: Diagnosis not present

## 2017-07-01 DIAGNOSIS — G3183 Dementia with Lewy bodies: Secondary | ICD-10-CM | POA: Diagnosis not present

## 2017-07-01 LAB — CBC
HCT: 27.9 % — ABNORMAL LOW (ref 35.0–47.0)
Hemoglobin: 9.3 g/dL — ABNORMAL LOW (ref 12.0–16.0)
MCH: 29.6 pg (ref 26.0–34.0)
MCHC: 33.4 g/dL (ref 32.0–36.0)
MCV: 88.6 fL (ref 80.0–100.0)
PLATELETS: 253 10*3/uL (ref 150–440)
RBC: 3.15 MIL/uL — ABNORMAL LOW (ref 3.80–5.20)
RDW: 14.5 % (ref 11.5–14.5)
WBC: 10.2 10*3/uL (ref 3.6–11.0)

## 2017-07-01 LAB — GLUCOSE, CAPILLARY
GLUCOSE-CAPILLARY: 172 mg/dL — AB (ref 65–99)
GLUCOSE-CAPILLARY: 128 mg/dL — AB (ref 65–99)
Glucose-Capillary: 177 mg/dL — ABNORMAL HIGH (ref 65–99)

## 2017-07-01 LAB — BASIC METABOLIC PANEL
Anion gap: 7 (ref 5–15)
BUN: 13 mg/dL (ref 6–20)
CALCIUM: 8.4 mg/dL — AB (ref 8.9–10.3)
CHLORIDE: 104 mmol/L (ref 101–111)
CO2: 24 mmol/L (ref 22–32)
CREATININE: 0.65 mg/dL (ref 0.44–1.00)
GFR calc Af Amer: >60 mL/min (ref >60)
GFR calc non Af Amer: >60 mL/min (ref >60)
GLUCOSE: 181 mg/dL — AB (ref 65–99)
Potassium: 4.1 mmol/L (ref 3.5–5.1)
Sodium: 135 mmol/L (ref 135–145)

## 2017-07-01 MED ORDER — PANTOPRAZOLE SODIUM 40 MG PO TBEC: 40.0000 mg | DELAYED_RELEASE_TABLET | Freq: Every day | ORAL | 0 refills | Status: DC

## 2017-07-01 MED ORDER — DOCUSATE SODIUM 100 MG PO CAPS: 100.0000 mg | ORAL_CAPSULE | Freq: Two times a day (BID) | ORAL | 0 refills | Status: AC

## 2017-07-01 MED ORDER — GLUCERNA PO LIQD: 237.0000 mL | Freq: Two times a day (BID) | ORAL | 0 refills | Status: AC

## 2017-07-01 MED ORDER — ENOXAPARIN SODIUM 40 MG/0.4ML ~~LOC~~ SOLN: 40.0000 mg | SUBCUTANEOUS | 0 refills | Status: DC

## 2017-07-01 MED ORDER — CEFUROXIME AXETIL 250 MG PO TABS: 250.0000 mg | ORAL_TABLET | Freq: Two times a day (BID) | ORAL | 0 refills | Status: AC

## 2017-07-01 MED ORDER — OXYCODONE HCL 5 MG PO TABS: 5.0000 mg | ORAL_TABLET | ORAL | 0 refills | Status: DC | PRN

## 2017-07-01 NOTE — Progress Notes (Signed)
Subjective: 2 Days Post-Op Procedure(s) (LRB): INTRAMEDULLARY (IM) NAIL INTERTROCHANTRIC (Left) Patient reports pain as mild in the left hip but complains of increasing left shoulder pain. Patient is confused this AM, states that "people keep telling me that I broke my hip". Plan will be for discharge to SNF when medically appropriate. Negative for chest pain and shortness of breath Fever: Low grade temps last night, 99. Gastrointestinal:Negative for nausea and vomiting  Objective: Vital signs in last 24 hours: Temp:  [99.2 F (37.3 C)-99.6 F (37.6 C)] 99.2 F (37.3 C) (08/14 0000) Pulse Rate:  [66-88] 66 (08/14 0000) Resp:  [16-20] 16 (08/14 0000) BP: (113-115)/(47-50) 115/50 (08/14 0000) SpO2:  [96 %] 96 % (08/14 0000)  Intake/Output from previous day:  Intake/Output Summary (Last 24 hours) at 07/01/17 0838 Last data filed at 07/01/17 0400  Gross per 24 hour  Intake              840 ml  Output              200 ml  Net              640 ml    Intake/Output this shift: No intake/output data recorded.  Labs:  Recent Labs  06/28/17 1803 06/29/17 0317 06/30/17 0413 07/01/17 0715  HGB 12.7 11.5* 9.8* 9.3*    Recent Labs  06/30/17 0413 07/01/17 0715  WBC 8.9 10.2  RBC 3.21* 3.15*  HCT 28.5* 27.9*  PLT 233 253    Recent Labs  06/30/17 0413 07/01/17 0715  NA 138 135  K 3.9 4.1  CL 107 104  CO2 24 24  BUN 12 13  CREATININE 0.52 0.65  GLUCOSE 175* 181*  CALCIUM 8.5* 8.4*    Recent Labs  06/28/17 1803  INR 0.98     EXAM General - Patient is Confused and Lacking Extremity - ABD soft Sensation intact distally Intact pulses distally Dorsiflexion/Plantar flexion intact Incision: dressing C/D/I Dressing/Incision - clean, dry, no drainage Motor Function - intact, moving foot and toes well on exam.   Abdomen soft with normal BS. Skin examination of the left shoulder reveals no ecchymosis or erythema.  Appears located within the socket.  Past  Medical History:  Diagnosis Date  . Benign essential hypertension   . Confusion   . Diabetes mellitus without complication (HCC)   . Hyperlipidemia LDL goal < 100   . Hypothyroidism   . Macular degeneration   . Memory loss   . Senile osteoporosis     Assessment/Plan: 2 Days Post-Op Procedure(s) (LRB): INTRAMEDULLARY (IM) NAIL INTERTROCHANTRIC (Left) Active Problems:   Closed left hip fracture (HCC)   Pressure injury of skin  Estimated body mass index is 21.39 kg/m as calculated from the following:   Height as of this encounter: 5\' 4"  (1.626 m).   Weight as of this encounter: 56.5 kg (124 lb 9.6 oz). Advance diet Up with therapy D/C IV fluids when tolerating po intake.  Labs reviewed, Hg 9.3 this AM.  CBC and BMP ordered for tomorrow morning. Urinalysis ordered for low grade fevers. Pt requesting x-ray of the left shoulder, x-ray ordered.  Will plan to perform steroid injection of the left shoulder prior to discharge. Up with therapy today, will likely need SNF upon discharge when medically appropriate. Begin working on having a bowel movement today.  DVT Prophylaxis - Lovenox, Foot Pumps and TED hose Weight-Bearing as tolerated to left leg  J. Horris LatinoLance Yazlynn Birkeland, PA-C [facility] 07/01/2017, 8:38 AM

## 2017-07-01 NOTE — Progress Notes (Signed)
Patient is medically stable for D/C back to [facility] today. Per Sue LushAndrea admissions coordinator at [facility] patient can return today to room 225. RN will call report at [phone] and arrange EMS for transport. Clinical Child psychotherapistocial Worker (CSW) sent D/C orders to [facility] via [employer]. CSW contacted patient's niece Harriett Sineancy and made her aware of above. Please reconsult if future social work needs arise. CSW signing off.   Baker Hughes IncorporatedBailey Ceilidh Torregrossa, LCSW [phone]

## 2017-07-01 NOTE — Progress Notes (Signed)
Report called to Okey Dupreose, Charity fundraiserN at [facility].

## 2017-07-01 NOTE — Progress Notes (Signed)
Physical Therapy Treatment Patient Details Name: Kara SniffMary E Castor MRN: [id number] DOB: 12/31/1934 Today's Date: 07/01/2017    History of Present Illness presented to ER secondary to mechanical fall; admitted with L intertrochanteric hip fracture, status post ORIF with nailing (06/29/17).  Of note, patient with recent R hip fracture status post hemiarthroplasty (05/11/17), WBAT, posterior THPs.    PT Comments    Chart reviewed.  X-ray L shoulder (-)  OK from nursing to participate in therapy.  Participated in exercises as described below.  Pt resists LE AAROM at times allowing only minimal movements.  During session pt received d/c orders and is awaiting transport back to [facility].  Nursing requested no OOB since d/c ordered.   Follow Up Recommendations    SNF    Equipment Recommendations       Recommendations for Other Services       Precautions / Restrictions Precautions Precautions: Fall Restrictions Weight Bearing Restrictions: Yes LLE Weight Bearing: Weight bearing as tolerated    Mobility  Bed Mobility                  Transfers                    Ambulation/Gait                 Stairs            Wheelchair Mobility    Modified Rankin (Stroke Patients Only)       Balance                                            Cognition Arousal/Alertness: Lethargic Behavior During Therapy: WFL for tasks assessed/performed Overall Cognitive Status: History of cognitive impairments - at baseline                                        Exercises Other Exercises Other Exercises: BLE P/AAROM 2 x 10 for ankle pumps, heel slides, ab/add, SLR, and SAQ.  Pt holds BLE very stiff with activities.    General Comments        Pertinent Vitals/Pain Pain Assessment: Faces Pain Score: 6  Faces Pain Scale: Hurts even more Pain Location: L hip Pain Descriptors / Indicators: Aching;Grimacing;Guarding Pain  Intervention(s): Limited activity within patient's tolerance    Home Living                      Prior Function            PT Goals (current goals can now be found in the care plan section) Progress towards PT goals: Progressing toward goals    Frequency    7X/week      PT Plan Current plan remains appropriate    Co-evaluation              AM-PAC PT "6 Clicks" Daily Activity  Outcome Measure  Difficulty turning over in bed (including adjusting bedclothes, sheets and blankets)?: Total Difficulty moving from lying on back to sitting on the side of the bed? : Total Difficulty sitting down on and standing up from a chair with arms (e.g., wheelchair, bedside commode, etc,.)?: Total Help needed moving to and from a bed to chair (including a wheelchair)?: Total Help needed  walking in hospital room?: Total Help needed climbing 3-5 steps with a railing? : Total 6 Click Score: 6    End of Session   Activity Tolerance: Patient tolerated treatment well Patient left: in bed;with call bell/phone within reach;with family/visitor present;with bed alarm set   Pain - Right/Left: Left Pain - part of body: Hip     Time: 1610-9604 PT Time Calculation (min) (ACUTE ONLY): 8 min  Charges:  $Therapeutic Exercise: 8-22 mins                    G Codes:       Danielle Dess, PTA 07/01/17, 10:55 AM

## 2017-07-01 NOTE — Progress Notes (Signed)
OT Cancellation Note  Patient Details Name: Kara SniffMary E Moore MRN: [id number] DOB: 01/12/1935   Cancelled Treatment:    Reason Eval/Treat Not Completed: Other (comment). Order received, chart reviewed. Pt to receive stat imaging of L shoulder due to increasing L shoulder pain. Will hold OT evaluation at this time and continue to follow acutely and re-attempt OT evaluation as pt is available/medically appropriate.   Richrd PrimeJamie Stiller, MPH, MS, OTR/L ascom [phone] 07/01/17, 9:05 AM

## 2017-07-01 NOTE — Progress Notes (Signed)
Pt was offered and received a left subacromial steroid injection using 1cc of kenalog-40 and 4cc of 0.5% Marcaine.  The injection was performed without complications.  Prior to the injection, the lateral shoulder was cleaned using a alcohol wipe.  Valeria BatmanJ. Lance Shakendra Griffeth, PA-C [facility]

## 2017-07-01 NOTE — Discharge Summary (Addendum)
[facility] [place] [facility]   PATIENT NAME: Kara Moore    MR#:  [id number]  DATE OF BIRTH:  1935-07-13  DATE OF ADMISSION:  06/28/2017 ADMITTING PHYSICIAN: Houston Siren, MD  DATE OF DISCHARGE: 07/01/2017   PRIMARY CARE PHYSICIAN: Karie Schwalbe, MD    ADMISSION DIAGNOSIS:  Closed fracture of left hip, initial encounter (HCC) [S72.002A]  DISCHARGE DIAGNOSIS:  Active Problems:   Closed left hip fracture (HCC)   Pressure injury of skin   SECONDARY DIAGNOSIS:   Past Medical History:  Diagnosis Date  . Benign essential hypertension   . Confusion   . Diabetes mellitus without complication (HCC)   . Hyperlipidemia LDL goal < 100   . Hypothyroidism   . Macular degeneration   . Memory loss   . Senile osteoporosis     HOSPITAL COURSE:   * Status post fall and left hip fracture-orthopedics has been consulted. - left intramedullary nail 06/29/17. - PT, rehab, pain management and Lovenox [place]. - d/c to rehab.  * Diabetes Type II without complication-hold metformin, glipizide. Place on sliding scale insulin.  * History of Parkinson's disease-continue Sinemet.  * Hypothyroidism-continue Synthroid.  * GERD-continue Pepcid.  * Hyperlipidemia-continue Pravachol.  * Osteoporosis-continue calcium and vitamin D supplements.  DISCHARGE CONDITIONS:   Stable.  CONSULTS OBTAINED:  Treatment Team:  Christena Flake, MD  DRUG ALLERGIES:  No Known Allergies  DISCHARGE MEDICATIONS:   Current Discharge Medication List    START taking these medications   Details  cefUROXime (CEFTIN) 250 MG tablet Take 1 tablet (250 mg total) by mouth 2 (two) times daily. Qty: 10 tablet, Refills: 0    docusate sodium (COLACE) 100 MG capsule Take 1 capsule (100 mg total) by mouth 2 (two) times daily. Qty: 10 capsule, Refills: 0    GLUCERNA (GLUCERNA) LIQD Take 237 mLs by mouth 2 (two) times daily between meals. Qty: 10 Can, Refills: 0     oxyCODONE (OXY IR/ROXICODONE) 5 MG immediate release tablet Take 1 tablet (5 mg total) by mouth every 4 (four) hours as needed for breakthrough pain. Qty: 30 tablet, Refills: 0    pantoprazole (PROTONIX) 40 MG tablet Take 1 tablet (40 mg total) by mouth daily. Qty: 30 tablet, Refills: 0      CONTINUE these medications which have CHANGED   Details  enoxaparin (LOVENOX) 40 MG/0.4ML injection Inject 0.4 mLs (40 mg total) into the skin daily. Qty: 12 Syringe, Refills: 0      CONTINUE these medications which have NOT CHANGED   Details  acetaminophen (TYLENOL) 325 MG tablet Take 650 mg by mouth 3 (three) times daily.     alum & mag hydroxide-simeth (MINTOX) 200-200-20 MG/5ML suspension Take 30 mLs by mouth every 6 (six) hours as needed for indigestion or heartburn.    calcium carbonate (OS-CAL - DOSED IN MG OF ELEMENTAL CALCIUM) 1250 (500 CA) MG tablet Take 1 tablet by mouth daily.     carbidopa-levodopa (SINEMET IR) 25-100 MG tablet Take 1 tablet by mouth 4 (four) times daily. Take at 8, 12, 4 PM, 8 PM. Qty: 120 tablet, Refills: 5   Associated Diagnoses: Parkinson's disease (HCC)    fluticasone (FLONASE) 50 MCG/ACT nasal spray Place 2 sprays into the nose as needed.     furosemide (LASIX) 20 MG tablet Take 10 mg by mouth daily. Take 1/2 tablet orally every day    glipiZIDE (GLUCOTROL) 5 MG tablet Take 5 mg by mouth daily before breakfast. For  Diabetes (elevated blood sugar)    guaifenesin (ROBITUSSIN) 100 MG/5ML syrup Take 200 mg by mouth 4 (four) times daily as needed for cough.    levothyroxine (SYNTHROID, LEVOTHROID) 50 MCG tablet TAKE 1 TABLET BY MOUTH DAILY BEFORE BREAKFAST ON EMPTY STOMACH *BINGO CARD* Qty: 30 tablet, Refills: 2    magnesium hydroxide (MILK OF MAGNESIA) 400 MG/5ML suspension Take by mouth daily as needed for constipation.    memantine (NAMENDA) 5 MG tablet Take 1 tablet (5 mg total) by mouth 2 (two) times daily. Qty: 60 tablet, Refills: 3   Associated  Diagnoses: Memory loss    metFORMIN (GLUCOPHAGE) 500 MG tablet Take 1 tablet (500 mg total) by mouth 2 (two) times daily with a meal. Qty: 60 tablet, Refills: 3    Multiple Vitamins-Minerals (DECUBI-VITE PO) Take 1 capsule by mouth daily.    Multiple Vitamins-Minerals (ICAPS MV) TABS Take 2 tablets by mouth daily.     potassium chloride (K-DUR,KLOR-CON) 10 MEQ tablet Take 10 mEq by mouth daily. Take to prevent potassium loss (due to taking fluid pill)    pravastatin (PRAVACHOL) 80 MG tablet Take 1 tablet (80 mg total) by mouth daily. For Cholesterol Qty: 90 tablet, Refills: 3    ranitidine (ZANTAC) 150 MG capsule Take 150 mg by mouth at bedtime.     Skin Protectants, Misc. (DIMETHICONE-ZINC OXIDE) cream Apply topically 3 (three) times daily.    traMADol (ULTRAM) 50 MG tablet Take one tablet by mouth twice daily for pain as needed andTake one tablet by mouth at bedtime for pain Qty: 90 tablet, Refills: 0   Associated Diagnoses: Primary osteoarthritis of both knees    Cholecalciferol 2000 UNITS CAPS Take 1 capsule (2,000 Units total) by mouth daily. Qty: 30 each, Refills: 3    mirtazapine (REMERON) 7.5 MG tablet Take 1 tablet (7.5 mg total) by mouth at bedtime. Qty: 90 tablet, Refills: 3         DISCHARGE INSTRUCTIONS:    Follow with Ortho clinic in 2 weeks.  If you experience worsening of your admission symptoms, develop shortness of breath, life threatening emergency, suicidal or homicidal thoughts you must seek medical attention immediately by calling 911 or calling your MD immediately  if symptoms less severe.  You Must read complete instructions/literature along with all the possible adverse reactions/side effects for all the Medicines you take and that have been prescribed to you. Take any new Medicines after you have completely understood and accept all the possible adverse reactions/side effects.   Please note  You were cared for by a hospitalist during your hospital  stay. If you have any questions about your discharge medications or the care you received while you were in the hospital after you are discharged, you can call the unit and asked to speak with the hospitalist on call if the hospitalist that took care of you is not available. Once you are discharged, your primary care physician will handle any further medical issues. Please note that NO REFILLS for any discharge medications will be authorized once you are discharged, as it is imperative that you return to your primary care physician (or establish a relationship with a primary care physician if you do not have one) for your aftercare needs so that they can reassess your need for medications and monitor your lab values.    Today   CHIEF COMPLAINT:   Chief Complaint  Patient presents with  . Fall    HISTORY OF PRESENT ILLNESS:  Kara Moore  is  a 82 y.o. female with a known history of Dementia, Parkinson's disease, diabetes, hypothyroidism, hyperlipidemia, osteoporosis who presents to the hospital after a mechanical fall and noted to have a left hip fracture. Patient was recently hospitalized in June for a fall and a right hip fracture and had surgery done now returns again after a fall and now noted to have a left hip fracture. Patient herself is a very poor historian given her dementia and Parkinson's and therefore most of the history obtained from family at bedside. As per the family patient had a mechanical fall attempting get up from her [employer]iel [employer] Lift unwitnessed as she was not supposed to and fell to the ground in the bathroom. Patient was brought to the ER and her left lower extremity was shortened and externally rotated in her x-rays confirmed a left hip fracture. Hospitalist services were contacted further treatment and evaluation.   VITAL SIGNS:  Blood pressure (!) 118/55, pulse 68, temperature 99 F (37.2 C), temperature source Axillary, resp. rate 18, height 5\' 4"  (1.626 m), weight 56.5 kg  (124 lb 9.6 oz), SpO2 97 %.  I/O:    Intake/Output Summary (Last 24 hours) at 07/01/17 1330 Last data filed at 07/01/17 0400  Gross per 24 hour  Intake              600 ml  Output                0 ml  Net              600 ml    PHYSICAL EXAMINATION:   GENERAL:  82 y.o.-year-old patient lying in the bed with no acute distress.  EYES: Pupils equal, round, reactive to light and accommodation. No scleral icterus. Extraocular muscles intact.  HEENT: Head atraumatic, normocephalic. Oropharynx and nasopharynx clear.  NECK:  Supple, no jugular venous distention. No thyroid enlargement, no tenderness.  LUNGS: Normal breath sounds bilaterally, no wheezing, rales,rhonchi or crepitation. No use of accessory muscles of respiration.  CARDIOVASCULAR: S1, S2 normal. No murmurs, rubs, or gallops.  ABDOMEN: Soft, nontender, nondistended. Bowel sounds present. No organomegaly or mass. External urinary catheter present. EXTREMITIES: No pedal edema, cyanosis, or clubbing.  NEUROLOGIC: Cranial nerves II through XII are intact. Muscle strength 4/5 in Both upper extremities. Lower extremity movements are limited due to fracture. Sensation intact. Gait not checked.  PSYCHIATRIC: The patient is alert and oriented x 3.  SKIN: No obvious rash, lesion, or ulcer.    DATA REVIEW:   CBC  Recent Labs Lab 07/01/17 0715  WBC 10.2  HGB 9.3*  HCT 27.9*  PLT 253    Chemistries   Recent Labs Lab 07/01/17 0715  NA 135  K 4.1  CL 104  CO2 24  GLUCOSE 181*  BUN 13  CREATININE 0.65  CALCIUM 8.4*    Cardiac Enzymes No results for input(s): TROPONINI in the last 168 hours.  Microbiology Results  Results for orders placed or performed during the hospital encounter of 06/28/17  MRSA PCR Screening     Status: None   Collection Time: 06/28/17  8:34 PM  Result Value Ref Range Status   MRSA by PCR NEGATIVE NEGATIVE Final    Comment:        The GeneXpert MRSA Assay (FDA approved for NASAL  specimens only), is one component of a comprehensive MRSA colonization surveillance program. It is not intended to diagnose MRSA infection nor to guide or monitor treatment for MRSA infections.  RADIOLOGY:  Dg Shoulder Left Port  Result Date: 07/01/2017 CLINICAL DATA:  82 year old female with left shoulder pain EXAM: LEFT SHOULDER - 1 VIEW COMPARISON:  None. FINDINGS: There is no evidence of fracture or dislocation. There is no evidence of arthropathy or other focal bone abnormality. Soft tissues are unremarkable. IMPRESSION: Negative. Electronically Signed   By: Malachy Moan M.D.   On: 07/01/2017 09:11    EKG:   Orders placed or performed during the hospital encounter of 05/11/17  . ED EKG  . ED EKG  . EKG 12-Lead  . EKG 12-Lead     Management plans discussed with the patient, family and they are in agreement.  CODE STATUS:     Code Status Orders        Start     Ordered   06/28/17 2013  Full code  Continuous     06/28/17 2012    Code Status History    Date Active Date Inactive Code Status Order ID Comments User Context   05/11/2017 10:04 AM 05/14/2017 10:10 PM Full Code [id number]  Altamese Dilling, MD Inpatient    Advance Directive Documentation     Most Recent Value  Type of Advance Directive  Healthcare Power of Attorney  Pre-existing out of facility DNR order (yellow form or pink MOST form)  -  "MOST" Form in Place?  -      TOTAL TIME TAKING CARE OF THIS PATIENT: 35 minutes.    Altamese Dilling M.D on 07/01/2017 at 1:30 PM  Between 7am to 6pm - Pager - [phone]  After 6pm go to [email or web address] - password [email or web address]  [facility]  Office  [phone]  CC: Primary care physician; Karie Schwalbe, MD   Note: This dictation was prepared with Dragon dictation along with smaller phrase technology. Any transcriptional errors that result from this process are unintentional.

## 2017-07-03 DIAGNOSIS — I10 Essential (primary) hypertension: Secondary | ICD-10-CM | POA: Diagnosis not present

## 2017-07-03 DIAGNOSIS — G3183 Dementia with Lewy bodies: Secondary | ICD-10-CM | POA: Diagnosis not present

## 2017-07-03 DIAGNOSIS — E119 Type 2 diabetes mellitus without complications: Secondary | ICD-10-CM | POA: Diagnosis not present

## 2017-07-03 DIAGNOSIS — S72143A Displaced intertrochanteric fracture of unspecified femur, initial encounter for closed fracture: Secondary | ICD-10-CM | POA: Diagnosis not present

## 2017-07-08 DIAGNOSIS — H353221 Exudative age-related macular degeneration, left eye, with active choroidal neovascularization: Secondary | ICD-10-CM | POA: Diagnosis not present

## 2017-07-14 ENCOUNTER — Ambulatory Visit: Payer: Medicare Other | Admitting: Internal Medicine

## 2017-07-16 ENCOUNTER — Ambulatory Visit: Payer: Medicare Other | Admitting: Neurology

## 2017-07-16 DIAGNOSIS — S72142D Displaced intertrochanteric fracture of left femur, subsequent encounter for closed fracture with routine healing: Secondary | ICD-10-CM | POA: Diagnosis not present

## 2017-08-12 DIAGNOSIS — H353221 Exudative age-related macular degeneration, left eye, with active choroidal neovascularization: Secondary | ICD-10-CM | POA: Diagnosis not present

## 2017-08-15 DIAGNOSIS — S72142D Displaced intertrochanteric fracture of left femur, subsequent encounter for closed fracture with routine healing: Secondary | ICD-10-CM | POA: Diagnosis not present

## 2017-09-03 DIAGNOSIS — E785 Hyperlipidemia, unspecified: Secondary | ICD-10-CM | POA: Diagnosis not present

## 2017-09-03 DIAGNOSIS — E039 Hypothyroidism, unspecified: Secondary | ICD-10-CM | POA: Diagnosis not present

## 2017-09-03 DIAGNOSIS — G20 Parkinson's disease: Secondary | ICD-10-CM | POA: Diagnosis not present

## 2017-09-03 DIAGNOSIS — K219 Gastro-esophageal reflux disease without esophagitis: Secondary | ICD-10-CM | POA: Diagnosis not present

## 2017-09-03 DIAGNOSIS — I10 Essential (primary) hypertension: Secondary | ICD-10-CM | POA: Diagnosis not present

## 2017-09-26 DIAGNOSIS — B351 Tinea unguium: Secondary | ICD-10-CM | POA: Diagnosis not present

## 2017-09-29 DIAGNOSIS — S72142D Displaced intertrochanteric fracture of left femur, subsequent encounter for closed fracture with routine healing: Secondary | ICD-10-CM | POA: Diagnosis not present

## 2017-10-07 DIAGNOSIS — H353221 Exudative age-related macular degeneration, left eye, with active choroidal neovascularization: Secondary | ICD-10-CM | POA: Diagnosis not present

## 2017-10-23 DIAGNOSIS — E441 Mild protein-calorie malnutrition: Secondary | ICD-10-CM | POA: Diagnosis not present

## 2017-10-23 DIAGNOSIS — F0280 Dementia in other diseases classified elsewhere without behavioral disturbance: Secondary | ICD-10-CM | POA: Diagnosis not present

## 2017-10-23 DIAGNOSIS — E119 Type 2 diabetes mellitus without complications: Secondary | ICD-10-CM | POA: Diagnosis not present

## 2017-10-23 DIAGNOSIS — G20 Parkinson's disease: Secondary | ICD-10-CM | POA: Diagnosis not present

## 2017-11-21 DIAGNOSIS — B351 Tinea unguium: Secondary | ICD-10-CM | POA: Diagnosis not present

## 2017-11-25 DIAGNOSIS — H353221 Exudative age-related macular degeneration, left eye, with active choroidal neovascularization: Secondary | ICD-10-CM | POA: Diagnosis not present

## 2017-12-04 DIAGNOSIS — E890 Postprocedural hypothyroidism: Secondary | ICD-10-CM | POA: Diagnosis not present

## 2017-12-04 DIAGNOSIS — I10 Essential (primary) hypertension: Secondary | ICD-10-CM | POA: Diagnosis not present

## 2017-12-04 DIAGNOSIS — E119 Type 2 diabetes mellitus without complications: Secondary | ICD-10-CM | POA: Diagnosis not present

## 2017-12-24 DIAGNOSIS — E039 Hypothyroidism, unspecified: Secondary | ICD-10-CM | POA: Diagnosis not present

## 2017-12-24 DIAGNOSIS — K219 Gastro-esophageal reflux disease without esophagitis: Secondary | ICD-10-CM | POA: Diagnosis not present

## 2017-12-24 DIAGNOSIS — E43 Unspecified severe protein-calorie malnutrition: Secondary | ICD-10-CM | POA: Diagnosis not present

## 2017-12-24 DIAGNOSIS — I10 Essential (primary) hypertension: Secondary | ICD-10-CM | POA: Diagnosis not present

## 2017-12-24 DIAGNOSIS — G20 Parkinson's disease: Secondary | ICD-10-CM | POA: Diagnosis not present

## 2017-12-24 DIAGNOSIS — E785 Hyperlipidemia, unspecified: Secondary | ICD-10-CM | POA: Diagnosis not present

## 2017-12-24 DIAGNOSIS — E119 Type 2 diabetes mellitus without complications: Secondary | ICD-10-CM | POA: Diagnosis not present

## 2018-01-05 DIAGNOSIS — H353221 Exudative age-related macular degeneration, left eye, with active choroidal neovascularization: Secondary | ICD-10-CM | POA: Diagnosis not present

## 2018-02-05 DIAGNOSIS — E876 Hypokalemia: Secondary | ICD-10-CM | POA: Diagnosis not present

## 2018-02-10 DIAGNOSIS — H353221 Exudative age-related macular degeneration, left eye, with active choroidal neovascularization: Secondary | ICD-10-CM | POA: Diagnosis not present

## 2018-02-17 DIAGNOSIS — E441 Mild protein-calorie malnutrition: Secondary | ICD-10-CM | POA: Diagnosis not present

## 2018-02-17 DIAGNOSIS — G20 Parkinson's disease: Secondary | ICD-10-CM | POA: Diagnosis not present

## 2018-02-17 DIAGNOSIS — K219 Gastro-esophageal reflux disease without esophagitis: Secondary | ICD-10-CM | POA: Diagnosis not present

## 2018-02-17 DIAGNOSIS — G3183 Dementia with Lewy bodies: Secondary | ICD-10-CM | POA: Diagnosis not present

## 2018-02-17 DIAGNOSIS — E119 Type 2 diabetes mellitus without complications: Secondary | ICD-10-CM | POA: Diagnosis not present

## 2018-02-25 ENCOUNTER — Encounter: Payer: Self-pay | Admitting: Internal Medicine

## 2018-02-25 DIAGNOSIS — H40053 Ocular hypertension, bilateral: Secondary | ICD-10-CM | POA: Diagnosis not present

## 2018-02-25 LAB — HM DIABETES EYE EXAM

## 2018-03-17 DIAGNOSIS — H353221 Exudative age-related macular degeneration, left eye, with active choroidal neovascularization: Secondary | ICD-10-CM | POA: Diagnosis not present

## 2018-04-28 DIAGNOSIS — H353221 Exudative age-related macular degeneration, left eye, with active choroidal neovascularization: Secondary | ICD-10-CM | POA: Diagnosis not present

## 2018-05-06 DIAGNOSIS — I10 Essential (primary) hypertension: Secondary | ICD-10-CM

## 2018-05-06 DIAGNOSIS — E039 Hypothyroidism, unspecified: Secondary | ICD-10-CM

## 2018-05-06 DIAGNOSIS — G20 Parkinson's disease: Secondary | ICD-10-CM

## 2018-05-06 DIAGNOSIS — K219 Gastro-esophageal reflux disease without esophagitis: Secondary | ICD-10-CM

## 2018-05-06 DIAGNOSIS — F0280 Dementia in other diseases classified elsewhere without behavioral disturbance: Secondary | ICD-10-CM

## 2018-05-06 DIAGNOSIS — E119 Type 2 diabetes mellitus without complications: Secondary | ICD-10-CM

## 2018-05-06 DIAGNOSIS — E43 Unspecified severe protein-calorie malnutrition: Secondary | ICD-10-CM

## 2018-05-15 DIAGNOSIS — B351 Tinea unguium: Secondary | ICD-10-CM | POA: Diagnosis not present

## 2018-06-02 DIAGNOSIS — H353221 Exudative age-related macular degeneration, left eye, with active choroidal neovascularization: Secondary | ICD-10-CM | POA: Diagnosis not present

## 2018-06-25 DIAGNOSIS — E441 Mild protein-calorie malnutrition: Secondary | ICD-10-CM | POA: Diagnosis not present

## 2018-06-25 DIAGNOSIS — G20 Parkinson's disease: Secondary | ICD-10-CM | POA: Diagnosis not present

## 2018-06-25 DIAGNOSIS — E119 Type 2 diabetes mellitus without complications: Secondary | ICD-10-CM | POA: Diagnosis not present

## 2018-06-25 DIAGNOSIS — G3183 Dementia with Lewy bodies: Secondary | ICD-10-CM | POA: Diagnosis not present

## 2018-06-25 DIAGNOSIS — K219 Gastro-esophageal reflux disease without esophagitis: Secondary | ICD-10-CM

## 2018-07-07 DIAGNOSIS — H353221 Exudative age-related macular degeneration, left eye, with active choroidal neovascularization: Secondary | ICD-10-CM | POA: Diagnosis not present

## 2018-07-17 DIAGNOSIS — M6281 Muscle weakness (generalized): Secondary | ICD-10-CM | POA: Diagnosis not present

## 2018-07-17 DIAGNOSIS — F0390 Unspecified dementia without behavioral disturbance: Secondary | ICD-10-CM | POA: Diagnosis not present

## 2018-07-18 DIAGNOSIS — F0390 Unspecified dementia without behavioral disturbance: Secondary | ICD-10-CM | POA: Diagnosis not present

## 2018-07-18 DIAGNOSIS — M6281 Muscle weakness (generalized): Secondary | ICD-10-CM | POA: Diagnosis not present

## 2018-07-22 DIAGNOSIS — M6281 Muscle weakness (generalized): Secondary | ICD-10-CM | POA: Diagnosis not present

## 2018-07-30 DIAGNOSIS — M6281 Muscle weakness (generalized): Secondary | ICD-10-CM | POA: Diagnosis not present

## 2018-08-03 DIAGNOSIS — M25561 Pain in right knee: Secondary | ICD-10-CM | POA: Diagnosis not present

## 2018-08-18 DIAGNOSIS — H353221 Exudative age-related macular degeneration, left eye, with active choroidal neovascularization: Secondary | ICD-10-CM | POA: Diagnosis not present

## 2018-08-18 DIAGNOSIS — H2513 Age-related nuclear cataract, bilateral: Secondary | ICD-10-CM | POA: Diagnosis not present

## 2018-08-18 DIAGNOSIS — H353112 Nonexudative age-related macular degeneration, right eye, intermediate dry stage: Secondary | ICD-10-CM | POA: Diagnosis not present

## 2018-08-21 DIAGNOSIS — N390 Urinary tract infection, site not specified: Secondary | ICD-10-CM | POA: Diagnosis not present

## 2018-08-26 DIAGNOSIS — E1140 Type 2 diabetes mellitus with diabetic neuropathy, unspecified: Secondary | ICD-10-CM | POA: Diagnosis not present

## 2018-08-26 DIAGNOSIS — B351 Tinea unguium: Secondary | ICD-10-CM | POA: Diagnosis not present

## 2018-08-26 DIAGNOSIS — E43 Unspecified severe protein-calorie malnutrition: Secondary | ICD-10-CM | POA: Diagnosis not present

## 2018-08-26 DIAGNOSIS — E039 Hypothyroidism, unspecified: Secondary | ICD-10-CM | POA: Diagnosis not present

## 2018-08-26 DIAGNOSIS — K219 Gastro-esophageal reflux disease without esophagitis: Secondary | ICD-10-CM | POA: Diagnosis not present

## 2018-08-26 DIAGNOSIS — E785 Hyperlipidemia, unspecified: Secondary | ICD-10-CM

## 2018-08-26 DIAGNOSIS — G20 Parkinson's disease: Secondary | ICD-10-CM

## 2018-08-26 DIAGNOSIS — G3183 Dementia with Lewy bodies: Secondary | ICD-10-CM | POA: Diagnosis not present

## 2018-08-27 DIAGNOSIS — E785 Hyperlipidemia, unspecified: Secondary | ICD-10-CM | POA: Diagnosis not present

## 2018-08-31 DIAGNOSIS — H40053 Ocular hypertension, bilateral: Secondary | ICD-10-CM | POA: Diagnosis not present

## 2018-09-21 DIAGNOSIS — H353221 Exudative age-related macular degeneration, left eye, with active choroidal neovascularization: Secondary | ICD-10-CM | POA: Diagnosis not present

## 2018-09-21 DIAGNOSIS — H353112 Nonexudative age-related macular degeneration, right eye, intermediate dry stage: Secondary | ICD-10-CM | POA: Diagnosis not present

## 2018-10-23 DIAGNOSIS — K219 Gastro-esophageal reflux disease without esophagitis: Secondary | ICD-10-CM | POA: Diagnosis not present

## 2018-10-23 DIAGNOSIS — E119 Type 2 diabetes mellitus without complications: Secondary | ICD-10-CM | POA: Diagnosis not present

## 2018-10-23 DIAGNOSIS — F0280 Dementia in other diseases classified elsewhere without behavioral disturbance: Secondary | ICD-10-CM | POA: Diagnosis not present

## 2018-10-26 DIAGNOSIS — E039 Hypothyroidism, unspecified: Secondary | ICD-10-CM | POA: Diagnosis not present

## 2018-10-26 DIAGNOSIS — E119 Type 2 diabetes mellitus without complications: Secondary | ICD-10-CM | POA: Diagnosis not present

## 2018-11-03 DIAGNOSIS — H353211 Exudative age-related macular degeneration, right eye, with active choroidal neovascularization: Secondary | ICD-10-CM | POA: Diagnosis not present

## 2018-11-03 DIAGNOSIS — H353221 Exudative age-related macular degeneration, left eye, with active choroidal neovascularization: Secondary | ICD-10-CM | POA: Diagnosis not present

## 2018-11-19 DIAGNOSIS — E785 Hyperlipidemia, unspecified: Secondary | ICD-10-CM | POA: Diagnosis not present

## 2018-12-14 DIAGNOSIS — H353221 Exudative age-related macular degeneration, left eye, with active choroidal neovascularization: Secondary | ICD-10-CM | POA: Diagnosis not present

## 2018-12-14 DIAGNOSIS — H353211 Exudative age-related macular degeneration, right eye, with active choroidal neovascularization: Secondary | ICD-10-CM | POA: Diagnosis not present

## 2019-01-01 DIAGNOSIS — K219 Gastro-esophageal reflux disease without esophagitis: Secondary | ICD-10-CM | POA: Diagnosis not present

## 2019-01-01 DIAGNOSIS — E43 Unspecified severe protein-calorie malnutrition: Secondary | ICD-10-CM | POA: Diagnosis not present

## 2019-01-01 DIAGNOSIS — G20 Parkinson's disease: Secondary | ICD-10-CM | POA: Diagnosis not present

## 2019-01-01 DIAGNOSIS — E119 Type 2 diabetes mellitus without complications: Secondary | ICD-10-CM | POA: Diagnosis not present

## 2019-01-01 DIAGNOSIS — E039 Hypothyroidism, unspecified: Secondary | ICD-10-CM | POA: Diagnosis not present

## 2019-01-25 DIAGNOSIS — H353221 Exudative age-related macular degeneration, left eye, with active choroidal neovascularization: Secondary | ICD-10-CM | POA: Diagnosis not present

## 2019-01-25 DIAGNOSIS — H353211 Exudative age-related macular degeneration, right eye, with active choroidal neovascularization: Secondary | ICD-10-CM | POA: Diagnosis not present

## 2019-02-23 DIAGNOSIS — G3183 Dementia with Lewy bodies: Secondary | ICD-10-CM | POA: Diagnosis not present

## 2019-02-23 DIAGNOSIS — E1169 Type 2 diabetes mellitus with other specified complication: Secondary | ICD-10-CM | POA: Diagnosis not present

## 2019-02-23 DIAGNOSIS — G20 Parkinson's disease: Secondary | ICD-10-CM | POA: Diagnosis not present

## 2019-02-23 DIAGNOSIS — E441 Mild protein-calorie malnutrition: Secondary | ICD-10-CM | POA: Diagnosis not present

## 2019-02-23 DIAGNOSIS — K219 Gastro-esophageal reflux disease without esophagitis: Secondary | ICD-10-CM | POA: Diagnosis not present

## 2019-03-29 DIAGNOSIS — E785 Hyperlipidemia, unspecified: Secondary | ICD-10-CM | POA: Diagnosis not present

## 2019-03-29 DIAGNOSIS — E119 Type 2 diabetes mellitus without complications: Secondary | ICD-10-CM | POA: Diagnosis not present

## 2019-03-29 DIAGNOSIS — E039 Hypothyroidism, unspecified: Secondary | ICD-10-CM | POA: Diagnosis not present

## 2019-04-13 DIAGNOSIS — H353221 Exudative age-related macular degeneration, left eye, with active choroidal neovascularization: Secondary | ICD-10-CM | POA: Diagnosis not present

## 2019-04-13 DIAGNOSIS — H353211 Exudative age-related macular degeneration, right eye, with active choroidal neovascularization: Secondary | ICD-10-CM | POA: Diagnosis not present

## 2019-04-28 DIAGNOSIS — E119 Type 2 diabetes mellitus without complications: Secondary | ICD-10-CM | POA: Diagnosis not present

## 2019-04-28 DIAGNOSIS — K219 Gastro-esophageal reflux disease without esophagitis: Secondary | ICD-10-CM | POA: Diagnosis not present

## 2019-04-28 DIAGNOSIS — F0280 Dementia in other diseases classified elsewhere without behavioral disturbance: Secondary | ICD-10-CM

## 2019-04-28 DIAGNOSIS — E43 Unspecified severe protein-calorie malnutrition: Secondary | ICD-10-CM | POA: Diagnosis not present

## 2019-04-28 DIAGNOSIS — G20 Parkinson's disease: Secondary | ICD-10-CM | POA: Diagnosis not present

## 2019-04-28 DIAGNOSIS — E039 Hypothyroidism, unspecified: Secondary | ICD-10-CM | POA: Diagnosis not present

## 2019-04-28 DIAGNOSIS — M1990 Unspecified osteoarthritis, unspecified site: Secondary | ICD-10-CM | POA: Diagnosis not present

## 2019-05-17 DIAGNOSIS — R441 Visual hallucinations: Secondary | ICD-10-CM | POA: Diagnosis not present

## 2019-05-25 DIAGNOSIS — H353221 Exudative age-related macular degeneration, left eye, with active choroidal neovascularization: Secondary | ICD-10-CM | POA: Diagnosis not present

## 2019-05-31 DIAGNOSIS — H40053 Ocular hypertension, bilateral: Secondary | ICD-10-CM | POA: Diagnosis not present

## 2019-06-11 DIAGNOSIS — B351 Tinea unguium: Secondary | ICD-10-CM | POA: Diagnosis not present

## 2019-06-15 DIAGNOSIS — B342 Coronavirus infection, unspecified: Secondary | ICD-10-CM | POA: Diagnosis not present

## 2019-06-16 ENCOUNTER — Other Ambulatory Visit: Payer: Self-pay

## 2019-06-29 DIAGNOSIS — F0280 Dementia in other diseases classified elsewhere without behavioral disturbance: Secondary | ICD-10-CM

## 2019-06-29 DIAGNOSIS — M159 Polyosteoarthritis, unspecified: Secondary | ICD-10-CM

## 2019-06-29 DIAGNOSIS — G20 Parkinson's disease: Secondary | ICD-10-CM

## 2019-06-29 DIAGNOSIS — E119 Type 2 diabetes mellitus without complications: Secondary | ICD-10-CM

## 2019-06-29 DIAGNOSIS — E039 Hypothyroidism, unspecified: Secondary | ICD-10-CM

## 2019-07-06 DIAGNOSIS — H353221 Exudative age-related macular degeneration, left eye, with active choroidal neovascularization: Secondary | ICD-10-CM | POA: Diagnosis not present

## 2019-07-22 DIAGNOSIS — Z03818 Encounter for observation for suspected exposure to other biological agents ruled out: Secondary | ICD-10-CM | POA: Diagnosis not present

## 2019-07-28 DIAGNOSIS — Z03818 Encounter for observation for suspected exposure to other biological agents ruled out: Secondary | ICD-10-CM | POA: Diagnosis not present

## 2019-08-17 DIAGNOSIS — H353221 Exudative age-related macular degeneration, left eye, with active choroidal neovascularization: Secondary | ICD-10-CM | POA: Diagnosis not present

## 2019-08-17 DIAGNOSIS — H353211 Exudative age-related macular degeneration, right eye, with active choroidal neovascularization: Secondary | ICD-10-CM | POA: Diagnosis not present

## 2019-08-17 LAB — HM DIABETES EYE EXAM

## 2019-08-20 DIAGNOSIS — Z03818 Encounter for observation for suspected exposure to other biological agents ruled out: Secondary | ICD-10-CM | POA: Diagnosis not present

## 2019-08-25 DIAGNOSIS — Z03818 Encounter for observation for suspected exposure to other biological agents ruled out: Secondary | ICD-10-CM | POA: Diagnosis not present

## 2019-08-31 DIAGNOSIS — Z03818 Encounter for observation for suspected exposure to other biological agents ruled out: Secondary | ICD-10-CM | POA: Diagnosis not present

## 2019-09-01 DIAGNOSIS — M1990 Unspecified osteoarthritis, unspecified site: Secondary | ICD-10-CM | POA: Diagnosis not present

## 2019-09-01 DIAGNOSIS — I10 Essential (primary) hypertension: Secondary | ICD-10-CM | POA: Diagnosis not present

## 2019-09-01 DIAGNOSIS — G20 Parkinson's disease: Secondary | ICD-10-CM | POA: Diagnosis not present

## 2019-09-01 DIAGNOSIS — E43 Unspecified severe protein-calorie malnutrition: Secondary | ICD-10-CM | POA: Diagnosis not present

## 2019-09-01 DIAGNOSIS — F3189 Other bipolar disorder: Secondary | ICD-10-CM | POA: Diagnosis not present

## 2019-09-01 DIAGNOSIS — E039 Hypothyroidism, unspecified: Secondary | ICD-10-CM | POA: Diagnosis not present

## 2019-09-01 DIAGNOSIS — E1140 Type 2 diabetes mellitus with diabetic neuropathy, unspecified: Secondary | ICD-10-CM | POA: Diagnosis not present

## 2019-09-03 DIAGNOSIS — Z03818 Encounter for observation for suspected exposure to other biological agents ruled out: Secondary | ICD-10-CM | POA: Diagnosis not present

## 2019-09-07 ENCOUNTER — Encounter: Payer: Self-pay | Admitting: Ophthalmology

## 2019-10-05 DIAGNOSIS — R2231 Localized swelling, mass and lump, right upper limb: Secondary | ICD-10-CM | POA: Diagnosis not present

## 2019-10-07 DIAGNOSIS — E119 Type 2 diabetes mellitus without complications: Secondary | ICD-10-CM | POA: Diagnosis not present

## 2019-10-19 DIAGNOSIS — H353221 Exudative age-related macular degeneration, left eye, with active choroidal neovascularization: Secondary | ICD-10-CM | POA: Diagnosis not present

## 2019-10-26 DIAGNOSIS — G3183 Dementia with Lewy bodies: Secondary | ICD-10-CM | POA: Diagnosis not present

## 2019-10-26 DIAGNOSIS — E1169 Type 2 diabetes mellitus with other specified complication: Secondary | ICD-10-CM

## 2019-10-26 DIAGNOSIS — G20 Parkinson's disease: Secondary | ICD-10-CM | POA: Diagnosis not present

## 2019-10-26 DIAGNOSIS — M159 Polyosteoarthritis, unspecified: Secondary | ICD-10-CM

## 2019-10-26 DIAGNOSIS — E039 Hypothyroidism, unspecified: Secondary | ICD-10-CM | POA: Diagnosis not present

## 2019-12-29 DIAGNOSIS — E039 Hypothyroidism, unspecified: Secondary | ICD-10-CM

## 2019-12-29 DIAGNOSIS — E119 Type 2 diabetes mellitus without complications: Secondary | ICD-10-CM

## 2019-12-29 DIAGNOSIS — G20 Parkinson's disease: Secondary | ICD-10-CM | POA: Diagnosis not present

## 2019-12-29 DIAGNOSIS — E43 Unspecified severe protein-calorie malnutrition: Secondary | ICD-10-CM

## 2019-12-29 DIAGNOSIS — M1990 Unspecified osteoarthritis, unspecified site: Secondary | ICD-10-CM | POA: Diagnosis not present

## 2019-12-29 DIAGNOSIS — F0150 Vascular dementia without behavioral disturbance: Secondary | ICD-10-CM

## 2020-03-03 DIAGNOSIS — B351 Tinea unguium: Secondary | ICD-10-CM | POA: Diagnosis not present

## 2020-03-03 DIAGNOSIS — E119 Type 2 diabetes mellitus without complications: Secondary | ICD-10-CM | POA: Diagnosis not present

## 2020-03-07 DIAGNOSIS — M159 Polyosteoarthritis, unspecified: Secondary | ICD-10-CM

## 2020-03-07 DIAGNOSIS — E1169 Type 2 diabetes mellitus with other specified complication: Secondary | ICD-10-CM | POA: Diagnosis not present

## 2020-03-07 DIAGNOSIS — G3183 Dementia with Lewy bodies: Secondary | ICD-10-CM

## 2020-03-07 DIAGNOSIS — G20 Parkinson's disease: Secondary | ICD-10-CM | POA: Diagnosis not present

## 2020-03-07 DIAGNOSIS — E441 Mild protein-calorie malnutrition: Secondary | ICD-10-CM | POA: Diagnosis not present

## 2020-04-21 DIAGNOSIS — G20 Parkinson's disease: Secondary | ICD-10-CM

## 2020-04-21 DIAGNOSIS — E43 Unspecified severe protein-calorie malnutrition: Secondary | ICD-10-CM

## 2020-04-21 DIAGNOSIS — E1149 Type 2 diabetes mellitus with other diabetic neurological complication: Secondary | ICD-10-CM | POA: Diagnosis not present

## 2020-04-21 DIAGNOSIS — B351 Tinea unguium: Secondary | ICD-10-CM | POA: Diagnosis not present

## 2020-04-21 DIAGNOSIS — E039 Hypothyroidism, unspecified: Secondary | ICD-10-CM | POA: Diagnosis not present

## 2020-04-21 DIAGNOSIS — M1990 Unspecified osteoarthritis, unspecified site: Secondary | ICD-10-CM | POA: Diagnosis not present

## 2020-04-21 DIAGNOSIS — I10 Essential (primary) hypertension: Secondary | ICD-10-CM

## 2020-04-21 DIAGNOSIS — G3183 Dementia with Lewy bodies: Secondary | ICD-10-CM

## 2020-08-23 DIAGNOSIS — E119 Type 2 diabetes mellitus without complications: Secondary | ICD-10-CM | POA: Diagnosis not present

## 2020-08-23 DIAGNOSIS — B351 Tinea unguium: Secondary | ICD-10-CM | POA: Diagnosis not present

## 2020-08-23 DIAGNOSIS — E43 Unspecified severe protein-calorie malnutrition: Secondary | ICD-10-CM

## 2020-08-23 DIAGNOSIS — M1990 Unspecified osteoarthritis, unspecified site: Secondary | ICD-10-CM | POA: Diagnosis not present

## 2020-08-23 DIAGNOSIS — E039 Hypothyroidism, unspecified: Secondary | ICD-10-CM | POA: Diagnosis not present

## 2020-08-23 DIAGNOSIS — I10 Essential (primary) hypertension: Secondary | ICD-10-CM | POA: Diagnosis not present

## 2020-09-28 DIAGNOSIS — B029 Zoster without complications: Secondary | ICD-10-CM | POA: Diagnosis not present

## 2020-09-29 DIAGNOSIS — Z23 Encounter for immunization: Secondary | ICD-10-CM | POA: Diagnosis not present

## 2020-10-15 ENCOUNTER — Inpatient Hospital Stay
Admission: EM | Admit: 2020-10-15 | Discharge: 2020-10-15 | DRG: 536 | Disposition: A | Discharge status: Other Acute Inpatient Hospital | Payer: Medicare Other | Source: Skilled Nursing Facility | Attending: Internal Medicine | Admitting: Internal Medicine

## 2020-10-15 ENCOUNTER — Encounter (HOSPITAL_COMMUNITY): Payer: Self-pay | Admitting: Internal Medicine

## 2020-10-15 ENCOUNTER — Emergency Department: Payer: Medicare Other

## 2020-10-15 ENCOUNTER — Other Ambulatory Visit: Payer: Self-pay

## 2020-10-15 ENCOUNTER — Inpatient Hospital Stay (HOSPITAL_COMMUNITY): Payer: Medicare Other

## 2020-10-15 ENCOUNTER — Inpatient Hospital Stay (HOSPITAL_COMMUNITY)
Admission: AD | Admit: 2020-10-15 | Discharge: 2020-10-19 | DRG: 480 | Disposition: A | Discharge status: Hospice | Payer: Medicare Other | Source: Other Acute Inpatient Hospital | Attending: Internal Medicine | Admitting: Internal Medicine

## 2020-10-15 DIAGNOSIS — S7291XA Unspecified fracture of right femur, initial encounter for closed fracture: Secondary | ICD-10-CM | POA: Diagnosis present

## 2020-10-15 DIAGNOSIS — Z7984 Long term (current) use of oral hypoglycemic drugs: Secondary | ICD-10-CM

## 2020-10-15 DIAGNOSIS — E039 Hypothyroidism, unspecified: Secondary | ICD-10-CM | POA: Diagnosis present

## 2020-10-15 DIAGNOSIS — R54 Age-related physical debility: Secondary | ICD-10-CM | POA: Diagnosis present

## 2020-10-15 DIAGNOSIS — I10 Essential (primary) hypertension: Secondary | ICD-10-CM | POA: Diagnosis present

## 2020-10-15 DIAGNOSIS — F0280 Dementia in other diseases classified elsewhere without behavioral disturbance: Secondary | ICD-10-CM | POA: Diagnosis present

## 2020-10-15 DIAGNOSIS — Z79890 Hormone replacement therapy (postmenopausal): Secondary | ICD-10-CM

## 2020-10-15 DIAGNOSIS — T148XXA Other injury of unspecified body region, initial encounter: Secondary | ICD-10-CM

## 2020-10-15 DIAGNOSIS — S72301A Unspecified fracture of shaft of right femur, initial encounter for closed fracture: Principal | ICD-10-CM | POA: Diagnosis present

## 2020-10-15 DIAGNOSIS — Z419 Encounter for procedure for purposes other than remedying health state, unspecified: Secondary | ICD-10-CM

## 2020-10-15 DIAGNOSIS — Z7189 Other specified counseling: Secondary | ICD-10-CM | POA: Diagnosis not present

## 2020-10-15 DIAGNOSIS — N390 Urinary tract infection, site not specified: Secondary | ICD-10-CM

## 2020-10-15 DIAGNOSIS — E785 Hyperlipidemia, unspecified: Secondary | ICD-10-CM | POA: Diagnosis present

## 2020-10-15 DIAGNOSIS — W19XXXA Unspecified fall, initial encounter: Secondary | ICD-10-CM | POA: Diagnosis present

## 2020-10-15 DIAGNOSIS — R531 Weakness: Secondary | ICD-10-CM | POA: Diagnosis not present

## 2020-10-15 DIAGNOSIS — E559 Vitamin D deficiency, unspecified: Secondary | ICD-10-CM | POA: Diagnosis present

## 2020-10-15 DIAGNOSIS — S7221XA Displaced subtrochanteric fracture of right femur, initial encounter for closed fracture: Principal | ICD-10-CM | POA: Diagnosis present

## 2020-10-15 DIAGNOSIS — Z20822 Contact with and (suspected) exposure to covid-19: Secondary | ICD-10-CM | POA: Diagnosis present

## 2020-10-15 DIAGNOSIS — Z79899 Other long term (current) drug therapy: Secondary | ICD-10-CM | POA: Diagnosis not present

## 2020-10-15 DIAGNOSIS — M9701XA Periprosthetic fracture around internal prosthetic right hip joint, initial encounter: Secondary | ICD-10-CM | POA: Diagnosis present

## 2020-10-15 DIAGNOSIS — N179 Acute kidney failure, unspecified: Secondary | ICD-10-CM | POA: Diagnosis present

## 2020-10-15 DIAGNOSIS — H3530 Unspecified macular degeneration: Secondary | ICD-10-CM | POA: Diagnosis present

## 2020-10-15 DIAGNOSIS — S72331A Displaced oblique fracture of shaft of right femur, initial encounter for closed fracture: Secondary | ICD-10-CM

## 2020-10-15 DIAGNOSIS — Z66 Do not resuscitate: Secondary | ICD-10-CM | POA: Diagnosis present

## 2020-10-15 DIAGNOSIS — R112 Nausea with vomiting, unspecified: Secondary | ICD-10-CM

## 2020-10-15 DIAGNOSIS — E119 Type 2 diabetes mellitus without complications: Secondary | ICD-10-CM | POA: Diagnosis not present

## 2020-10-15 DIAGNOSIS — E860 Dehydration: Secondary | ICD-10-CM | POA: Diagnosis present

## 2020-10-15 DIAGNOSIS — S72001A Fracture of unspecified part of neck of right femur, initial encounter for closed fracture: Secondary | ICD-10-CM | POA: Diagnosis not present

## 2020-10-15 DIAGNOSIS — G9341 Metabolic encephalopathy: Principal | ICD-10-CM

## 2020-10-15 DIAGNOSIS — W1830XA Fall on same level, unspecified, initial encounter: Secondary | ICD-10-CM | POA: Diagnosis present

## 2020-10-15 DIAGNOSIS — D649 Anemia, unspecified: Secondary | ICD-10-CM | POA: Diagnosis not present

## 2020-10-15 DIAGNOSIS — G20 Parkinson's disease: Secondary | ICD-10-CM | POA: Diagnosis present

## 2020-10-15 DIAGNOSIS — Z833 Family history of diabetes mellitus: Secondary | ICD-10-CM | POA: Diagnosis not present

## 2020-10-15 DIAGNOSIS — Y92129 Unspecified place in nursing home as the place of occurrence of the external cause: Secondary | ICD-10-CM

## 2020-10-15 DIAGNOSIS — Z8249 Family history of ischemic heart disease and other diseases of the circulatory system: Secondary | ICD-10-CM | POA: Diagnosis not present

## 2020-10-15 DIAGNOSIS — D62 Acute posthemorrhagic anemia: Secondary | ICD-10-CM | POA: Diagnosis not present

## 2020-10-15 DIAGNOSIS — G3183 Dementia with Lewy bodies: Secondary | ICD-10-CM | POA: Diagnosis present

## 2020-10-15 DIAGNOSIS — R4182 Altered mental status, unspecified: Secondary | ICD-10-CM

## 2020-10-15 DIAGNOSIS — Z515 Encounter for palliative care: Secondary | ICD-10-CM | POA: Diagnosis not present

## 2020-10-15 DIAGNOSIS — S7290XA Unspecified fracture of unspecified femur, initial encounter for closed fracture: Secondary | ICD-10-CM

## 2020-10-15 DIAGNOSIS — B9620 Unspecified Escherichia coli [E. coli] as the cause of diseases classified elsewhere: Secondary | ICD-10-CM | POA: Diagnosis present

## 2020-10-15 DIAGNOSIS — E8889 Other specified metabolic disorders: Secondary | ICD-10-CM | POA: Diagnosis present

## 2020-10-15 DIAGNOSIS — R413 Other amnesia: Secondary | ICD-10-CM | POA: Diagnosis not present

## 2020-10-15 DIAGNOSIS — G20A1 Parkinson's disease without dyskinesia, without mention of fluctuations: Secondary | ICD-10-CM | POA: Diagnosis present

## 2020-10-15 DIAGNOSIS — I493 Ventricular premature depolarization: Secondary | ICD-10-CM | POA: Diagnosis present

## 2020-10-15 DIAGNOSIS — F0391 Unspecified dementia with behavioral disturbance: Secondary | ICD-10-CM | POA: Diagnosis not present

## 2020-10-15 LAB — COMPREHENSIVE METABOLIC PANEL
ALT: 15 U/L (ref 0–44)
AST: 23 U/L (ref 15–41)
Albumin: 3.5 g/dL (ref 3.5–5.0)
Alkaline Phosphatase: 79 U/L (ref 38–126)
Anion gap: 12 (ref 5–15)
BUN: 15 mg/dL (ref 8–23)
CO2: 25 mmol/L (ref 22–32)
Calcium: 9.1 mg/dL (ref 8.9–10.3)
Chloride: 102 mmol/L (ref 98–111)
Creatinine, Ser: 0.64 mg/dL (ref 0.44–1.00)
GFR, Estimated: >60 mL/min (ref >60)
Glucose, Bld: 191 mg/dL — ABNORMAL HIGH (ref 70–99)
Potassium: 3.9 mmol/L (ref 3.5–5.1)
Sodium: 139 mmol/L (ref 135–145)
Total Bilirubin: 0.5 mg/dL (ref 0.3–1.2)
Total Protein: 6.8 g/dL (ref 6.5–8.1)

## 2020-10-15 LAB — RESP PANEL BY RT-PCR (FLU A&B, COVID) ARPGX2
Influenza A by PCR: NEGATIVE
Influenza B by PCR: NEGATIVE
SARS Coronavirus 2 by RT PCR: NEGATIVE

## 2020-10-15 LAB — URINALYSIS, COMPLETE (UACMP) WITH MICROSCOPIC
Bilirubin Urine: NEGATIVE
Glucose, UA: NEGATIVE mg/dL
Hgb urine dipstick: NEGATIVE
Ketones, ur: 20 mg/dL — AB
Leukocytes,Ua: NEGATIVE
Nitrite: POSITIVE — AB
Protein, ur: NEGATIVE mg/dL
Specific Gravity, Urine: 1.018 (ref 1.005–1.030)
Squamous Epithelial / LPF: NONE SEEN (ref 0–5)
pH: 6 (ref 5.0–8.0)

## 2020-10-15 LAB — CBC WITH DIFFERENTIAL/PLATELET
Abs Immature Granulocytes: 0.03 10*3/uL (ref 0.00–0.07)
Basophils Absolute: 0 10*3/uL (ref 0.0–0.1)
Basophils Relative: 0 %
Eosinophils Absolute: 0 10*3/uL (ref 0.0–0.5)
Eosinophils Relative: 1 %
HCT: 37.9 % (ref 36.0–46.0)
Hemoglobin: 12.8 g/dL (ref 12.0–15.0)
Immature Granulocytes: 0 %
Lymphocytes Relative: 26 %
Lymphs Abs: 2 10*3/uL (ref 0.7–4.0)
MCH: 31.9 pg (ref 26.0–34.0)
MCHC: 33.8 g/dL (ref 30.0–36.0)
MCV: 94.5 fL (ref 80.0–100.0)
Monocytes Absolute: 0.5 10*3/uL (ref 0.1–1.0)
Monocytes Relative: 6 %
Neutro Abs: 5.1 10*3/uL (ref 1.7–7.7)
Neutrophils Relative %: 67 %
Platelets: 401 10*3/uL — ABNORMAL HIGH (ref 150–400)
RBC: 4.01 MIL/uL (ref 3.87–5.11)
RDW: 14.5 % (ref 11.5–15.5)
WBC: 7.6 10*3/uL (ref 4.0–10.5)
nRBC: 0 % (ref 0.0–0.2)

## 2020-10-15 MED ORDER — PRAVASTATIN SODIUM 40 MG PO TABS: 80.0000 mg | ORAL_TABLET | Freq: Every day | ORAL | Status: DC

## 2020-10-15 MED ORDER — MIRTAZAPINE 15 MG PO TABS
7.5000 mg | ORAL_TABLET | Freq: Every day | ORAL | Status: DC
  Administered 2020-10-16 – 2020-10-17 (×2): 7.5 mg via ORAL
  Filled 2020-10-15 (×4): qty 1

## 2020-10-15 MED ORDER — HEPARIN SODIUM (PORCINE) 5000 UNIT/ML IJ SOLN
5000.0000 [IU] | Freq: Three times a day (TID) | INTRAMUSCULAR | Status: DC
  Administered 2020-10-15 – 2020-10-18 (×7): 5000 [IU] via SUBCUTANEOUS
  Filled 2020-10-15 (×7): qty 1

## 2020-10-15 MED ORDER — KETOROLAC TROMETHAMINE 15 MG/ML IJ SOLN: 15.0000 mg | Freq: Four times a day (QID) | INTRAMUSCULAR | Status: DC | PRN

## 2020-10-15 MED ORDER — CEFAZOLIN SODIUM-DEXTROSE 2-4 GM/100ML-% IV SOLN
2.0000 g | INTRAVENOUS | Status: AC
  Administered 2020-10-16: 2 g via INTRAVENOUS
  Filled 2020-10-15: qty 100

## 2020-10-15 MED ORDER — LACTATED RINGERS IV SOLN: INTRAVENOUS | Status: DC

## 2020-10-15 MED ORDER — LEVOTHYROXINE SODIUM 50 MCG PO TABS
50.0000 ug | ORAL_TABLET | Freq: Every day | ORAL | Status: DC
  Administered 2020-10-17: 50 ug via ORAL
  Filled 2020-10-15 (×2): qty 1

## 2020-10-15 MED ORDER — PANTOPRAZOLE SODIUM 40 MG PO TBEC: 40.0000 mg | DELAYED_RELEASE_TABLET | Freq: Every day | ORAL | Status: DC

## 2020-10-15 MED ORDER — DOCUSATE SODIUM 100 MG PO CAPS: 100.0000 mg | ORAL_CAPSULE | Freq: Two times a day (BID) | ORAL | Status: DC | PRN

## 2020-10-15 MED ORDER — FLUTICASONE PROPIONATE 50 MCG/ACT NA SUSP: 1.0000 | Freq: Every day | NASAL | Status: DC

## 2020-10-15 MED ORDER — MORPHINE SULFATE (PF) 4 MG/ML IV SOLN
4.0000 mg | Freq: Once | INTRAVENOUS | Status: AC
  Administered 2020-10-15: 12:00:00 4 mg via INTRAVENOUS
  Filled 2020-10-15: qty 1

## 2020-10-15 MED ORDER — ALUM & MAG HYDROXIDE-SIMETH 200-200-20 MG/5ML PO SUSP: 30.0000 mL | Freq: Four times a day (QID) | ORAL | Status: DC | PRN

## 2020-10-15 MED ORDER — MAGNESIUM HYDROXIDE 400 MG/5ML PO SUSP
15.0000 mL | Freq: Every day | ORAL | Status: DC | PRN
  Filled 2020-10-15: qty 30

## 2020-10-15 MED ORDER — CARBIDOPA-LEVODOPA 25-100 MG PO TABS
1.0000 | ORAL_TABLET | ORAL | Status: DC
  Administered 2020-10-16 – 2020-10-19 (×5): 1 via ORAL
  Filled 2020-10-15 (×9): qty 1

## 2020-10-15 MED ORDER — POTASSIUM CHLORIDE CRYS ER 10 MEQ PO TBCR: 10.0000 meq | EXTENDED_RELEASE_TABLET | Freq: Every day | ORAL | Status: DC

## 2020-10-15 MED ORDER — SODIUM CHLORIDE 0.9 % IV SOLN
1.0000 g | Freq: Once | INTRAVENOUS | Status: AC
  Administered 2020-10-15: 13:00:00 1 g via INTRAVENOUS
  Filled 2020-10-15: qty 10

## 2020-10-15 MED ORDER — LISINOPRIL 5 MG PO TABS: 5.0000 mg | ORAL_TABLET | Freq: Every day | ORAL | Status: DC

## 2020-10-15 MED ORDER — MORPHINE SULFATE (PF) 2 MG/ML IV SOLN: 0.5000 mg | INTRAVENOUS | Status: DC | PRN

## 2020-10-15 MED ORDER — MORPHINE SULFATE (PF) 4 MG/ML IV SOLN
4.0000 mg | INTRAVENOUS | Status: AC
  Administered 2020-10-15: 4 mg via INTRAVENOUS
  Filled 2020-10-15: qty 1

## 2020-10-15 MED ORDER — MEMANTINE HCL 10 MG PO TABS: 5.0000 mg | ORAL_TABLET | Freq: Two times a day (BID) | ORAL | Status: DC

## 2020-10-15 MED ORDER — ACETAMINOPHEN 325 MG PO TABS
325.0000 mg | ORAL_TABLET | Freq: Four times a day (QID) | ORAL | Status: DC | PRN
  Administered 2020-10-17: 325 mg via ORAL
  Filled 2020-10-15: qty 1

## 2020-10-15 MED ORDER — GUAIFENESIN 100 MG/5ML PO SYRP: 200.0000 mg | ORAL_SOLUTION | Freq: Four times a day (QID) | ORAL | Status: DC | PRN

## 2020-10-15 MED ORDER — INSULIN ASPART 100 UNIT/ML ~~LOC~~ SOLN
0.0000 [IU] | Freq: Three times a day (TID) | SUBCUTANEOUS | Status: DC
  Administered 2020-10-16: 3 [IU] via SUBCUTANEOUS
  Administered 2020-10-17: 2 [IU] via SUBCUTANEOUS
  Administered 2020-10-17: 3 [IU] via SUBCUTANEOUS
  Administered 2020-10-18 (×2): 2 [IU] via SUBCUTANEOUS

## 2020-10-15 MED ORDER — GLUCERNA PO LIQD: 237.0000 mL | Freq: Two times a day (BID) | ORAL | Status: DC

## 2020-10-15 MED ORDER — FUROSEMIDE 20 MG PO TABS: 10.0000 mg | ORAL_TABLET | Freq: Every day | ORAL | Status: DC

## 2020-10-15 MED ORDER — CALCIUM CARBONATE 1250 (500 CA) MG PO TABS: 1.0000 | ORAL_TABLET | Freq: Every day | ORAL | Status: DC

## 2020-10-15 NOTE — ED Notes (Signed)
pts niece nancy green at bedside with patient all p's addressed, no requests at this time

## 2020-10-15 NOTE — ED Notes (Addendum)
Pt confused, agitated, hitting, and spitting at staff prior to performing in and out cath. When performing in and out at 1203 pt was agitated so staff needed to assist with procedure. Staff lifting rt leg up to provide space to perform cath while pt on left side. Minimal space between thighs noted however when performing cath staff heard a loud cracking sound, and rt knee noted to drop lower than rt thigh with inward rotation. Rt leg lowered to meet left leg on bed and MD immediately notified at 1204. Pt continued swatting at staff and denied any pain after injury noted. MD completed assessment of right lower limb and reported pulse, cap refill WNL at that time. At rest pt right thigh visibly deformed with proximal femur resting higher than distal femur. Imaging orders placed by MD shortly after he was notified.  Prior to completing in and out cath procedure bruising noted on pt right lower leg. Ems reported pt fall yesterday at [facility], pt was not sent out for assessment for fall

## 2020-10-15 NOTE — H&P (Signed)
History and Physical    Kara SniffMary E Moore ZOX:[id number] DOB: 10/21/1935 DOA: 10/15/2020  PCP: Karie SchwalbeLetvak, Richard I, MD (Confirm with patient/family/NH records and if not entered, this has [facility]) Patient coming from: SNF, [facility]  I have personally briefly reviewed patient's old medical records [facility]  Chief Complaint: Fx right femur  HPI: Kara Moore is a 85 y.o. female with medical history significant of  hypertension, hyperlipidemia, diabetes, and Lewy body dementia who presents to the ED for altered mental status.  Patient currently resides at [facility] and history is limited due to her dementia and altered mental status.  Per EMS, patient had a minor fall yesterday that did not require further evaluation.  Since waking up this morning.  Patient has seemed more confused than usual but EMS is unsure what her typical baseline is.  On arrival, patient is yelling out "help me."  She states she is hurting all over but is otherwise unable to provide any history.   ED Course: T 97.8   101/50  Hr 89  rr 18 ED-MD exam unremarkable. Eval revealed UTI. While being positioned for a straight cath there was a popping sound and immediate malrotation of the right leg. X-ray revealed a displaced fracture of the right femur aat the distal tip of the prosthetic stem from hemiarthroplasty. Due to the complex nature of the fracture Dr. Carola FrostHandy, trauma surgeon, was consulted and he recommended [facility].   Review of Systems: As per HPI otherwise 10 point review of systems negative. Caveat - advanced dementia and unable to give a hx.    Past Medical History:  Diagnosis Date  . Benign essential hypertension   . Confusion   . Diabetes mellitus without complication (HCC)   . Hyperlipidemia LDL goal < 100   . Hypothyroidism   . Macular degeneration   . Memory loss   . Senile osteoporosis     Past Surgical History:  Procedure  Laterality Date  . ADENOIDECTOMY  2011  . HIP ARTHROPLASTY Right 05/11/2017   Procedure: ARTHROPLASTY BIPOLAR HIP (HEMIARTHROPLASTY);  Surgeon: Lyndle HerrlichBowers, James R, MD;  Location: [facility] ORS;  Service: Orthopedics;  Laterality: Right;  . INTRAMEDULLARY (IM) NAIL INTERTROCHANTERIC Left 06/29/2017   Procedure: INTRAMEDULLARY (IM) NAIL INTERTROCHANTRIC;  Surgeon: Christena FlakePoggi, John J, MD;  Location: [facility] ORS;  Service: Orthopedics;  Laterality: Left;    Soc Hx - elderly woman living [facility] [place]. She is a non-ambulator. Poor communicator. Her niece is POA and a niece by marriage is also POA.   reports that she has never smoked. She has never used smokeless tobacco. She reports that she does not drink alcohol and does not use drugs.  No Known Allergies  Family History  Problem Relation Age of Onset  . Heart attack Father   . Cancer Sister   . Heart attack Brother   . Diabetes Brother   . Tremor Maternal Grandmother   . Diabetes Paternal Grandfather   . Tremor Maternal Aunt   . Diabetes Paternal Grandmother      Prior to Admission medications   Medication Sig Start Date End Date Taking? Authorizing Provider  acetaminophen (TYLENOL) 325 MG tablet Take 325 mg by mouth every 6 (six) hours as needed for mild pain.     [provider]  alum & mag hydroxide-simeth (MINTOX) 200-200-20 MG/5ML suspension Take 30 mLs by mouth every 6 (six) hours as needed  for indigestion or heartburn.    [provider]  calcium carbonate (OS-CAL - DOSED IN MG OF ELEMENTAL CALCIUM) 1250 (500 CA) MG tablet Take 1 tablet by mouth daily.     [provider]  carbidopa-levodopa (SINEMET IR) 25-100 MG tablet Take 1 tablet by mouth 4 (four) times daily. Take at 8, 12, 4 PM, 8 PM. 09/12/16   Huston Foley, MD  Cholecalciferol 2000 UNITS CAPS Take 1 capsule (2,000 Units total) by mouth daily. Patient not taking: Reported on 06/28/2017 10/28/14   Bufford Spikes L, DO  docusate sodium (COLACE) 100 MG  capsule Take 1 capsule (100 mg total) by mouth 2 (two) times daily. Patient taking differently: Take 100 mg by mouth 2 (two) times daily as needed for mild constipation.  07/01/17   Altamese Dilling, MD  enoxaparin (LOVENOX) 40 MG/0.4ML injection Inject 0.4 mLs (40 mg total) into the skin daily. 07/02/17 07/14/17  Altamese Dilling, MD  fluticasone (FLONASE) 50 MCG/ACT nasal spray Place 2 sprays into the nose as needed.     [provider]  furosemide (LASIX) 20 MG tablet Take 10 mg by mouth daily. Take 1/2 tablet orally every day    [provider]  glipiZIDE (GLUCOTROL) 5 MG tablet Take 5 mg by mouth daily before breakfast. For Diabetes (elevated blood sugar) 10/18/13   Reed, Tiffany L, DO  GLUCERNA (GLUCERNA) LIQD Take 237 mLs by mouth 2 (two) times daily between meals. 07/01/17   Altamese Dilling, MD  guaifenesin (ROBITUSSIN) 100 MG/5ML syrup Take 200 mg by mouth 4 (four) times daily as needed for cough.    [provider]  levothyroxine (SYNTHROID, LEVOTHROID) 50 MCG tablet TAKE 1 TABLET BY MOUTH DAILY BEFORE BREAKFAST ON EMPTY STOMACH *BINGO CARD* 11/03/14   Sharon Seller, NP  lisinopril (ZESTRIL) 5 MG tablet Take 5 mg by mouth daily. 09/15/20   [provider]  magnesium hydroxide (MILK OF MAGNESIA) 400 MG/5ML suspension Take by mouth daily as needed for constipation.    [provider]  memantine (NAMENDA) 5 MG tablet Take 1 tablet (5 mg total) by mouth 2 (two) times daily. 12/12/16   Reed, Tiffany L, DO  metFORMIN (GLUCOPHAGE) 500 MG tablet Take 1 tablet (500 mg total) by mouth 2 (two) times daily with a meal. 01/30/15   Reed, Tiffany L, DO  mirtazapine (REMERON) 7.5 MG tablet Take 1 tablet (7.5 mg total) by mouth at bedtime. 02/05/16   Reed, Tiffany L, DO  Multiple Vitamins-Minerals (DECUBI-VITE PO) Take 1 capsule by mouth daily.    [provider]  Multiple Vitamins-Minerals (ICAPS MV) TABS Take 2 tablets by mouth daily.      [provider]  oxyCODONE (OXY IR/ROXICODONE) 5 MG immediate release tablet Take 1 tablet (5 mg total) by mouth every 4 (four) hours as needed for breakthrough pain. 07/01/17   Altamese Dilling, MD  pantoprazole (PROTONIX) 40 MG tablet Take 1 tablet (40 mg total) by mouth daily. 07/02/17   Altamese Dilling, MD  potassium chloride (K-DUR,KLOR-CON) 10 MEQ tablet Take 10 mEq by mouth daily. Take to prevent potassium loss (due to taking fluid pill)    [provider]  pravastatin (PRAVACHOL) 80 MG tablet Take 1 tablet (80 mg total) by mouth daily. For Cholesterol 02/05/16   Reed, Tiffany L, DO  ranitidine (ZANTAC) 150 MG capsule Take 150 mg by mouth at bedtime.     [provider]  Skin Protectants, Misc. (DIMETHICONE-ZINC OXIDE) cream Apply topically 3 (three) times daily.  [provider]  traMADol (ULTRAM) 50 MG tablet Take one tablet by mouth twice daily for pain as needed andTake one tablet by mouth at bedtime for pain 05/14/17   Enedina Finner, MD  Vitamin D, Ergocalciferol, (DRISDOL) 1.25 MG (50000 UNIT) CAPS capsule Take 50,000 Units by mouth every 30 (thirty) days. On the 13th every month    [provider]    Physical Exam: General: chronically ill appearing woman who is non-communicative but does not appear to be in pain. Eyes:, lids and conjunctivae normal ENMT: Unable to exam.  Neck: forward flexed neck, rigid, no masses, no thyromegaly Respiratory: clear to auscultation bilaterally, no wheezing, no crackles. Normal respiratory effort. No accessory muscle use.  Cardiovascular: Frequent PVCs with pause. Soft II/VI systolic mm.  Abdomen: no tenderness, no masses palpated. No hepatosplenomegaly. Bowel sounds positive.  Musculoskeletal: Patient is regid, either from Parkinson's or lack of cooperation. Cog wheeling is noted.  Legs are flexed and rigid. Skin: no rashes, lesions, ulcers. No induration Neurologic: CN 2-12 grossly intact.    Psychiatric: Non-commuinicative. Known to have advanced Parkinson's related dementia    Labs on Admission: I have personally reviewed following labs and imaging studies  CBC: Recent Labs  Lab 10/15/20 1158  WBC 7.6  NEUTROABS 5.1  HGB 12.8  HCT 37.9  MCV 94.5  PLT 401*   Basic Metabolic Panel: Recent Labs  Lab 10/15/20 1312  NA 139  K 3.9  CL 102  CO2 25  GLUCOSE 191*  BUN 15  CREATININE 0.64  CALCIUM 9.1   GFR: Estimated Creatinine Clearance: 46 mL/min (by C-G formula based on SCr of 0.64 mg/dL). Liver Function Tests: Recent Labs  Lab 10/15/20 1312  AST 23  ALT 15  ALKPHOS 79  BILITOT 0.5  PROT 6.8  ALBUMIN 3.5   No results for input(s): LIPASE, AMYLASE in the last 168 hours. No results for input(s): AMMONIA in the last 168 hours. Coagulation Profile: No results for input(s): INR, PROTIME in the last 168 hours. Cardiac Enzymes: No results for input(s): CKTOTAL, CKMB, CKMBINDEX, TROPONINI in the last 168 hours. BNP (last 3 results) No results for input(s): PROBNP in the last 8760 hours. HbA1C: No results for input(s): HGBA1C in the last 72 hours. CBG: No results for input(s): GLUCAP in the last 168 hours. Lipid Profile: No results for input(s): CHOL, HDL, LDLCALC, TRIG, CHOLHDL, LDLDIRECT in the last 72 hours. Thyroid Function Tests: No results for input(s): TSH, T4TOTAL, FREET4, T3FREE, THYROIDAB in the last 72 hours. Anemia Panel: No results for input(s): VITAMINB12, FOLATE, FERRITIN, TIBC, IRON, RETICCTPCT in the last 72 hours. Urine analysis:    Component Value Date/Time   COLORURINE YELLOW (A) 10/15/2020 1158   APPEARANCEUR HAZY (A) 10/15/2020 1158   APPEARANCEUR Cloudy 11/12/2014 0232   LABSPEC 1.018 10/15/2020 1158   LABSPEC 1.017 11/12/2014 0232   PHURINE 6.0 10/15/2020 1158   GLUCOSEU NEGATIVE 10/15/2020 1158   GLUCOSEU Negative 11/12/2014 0232   HGBUR NEGATIVE 10/15/2020 1158   BILIRUBINUR NEGATIVE 10/15/2020 1158   BILIRUBINUR  Negative 11/12/2014 0232   KETONESUR 20 (A) 10/15/2020 1158   PROTEINUR NEGATIVE 10/15/2020 1158   NITRITE POSITIVE (A) 10/15/2020 1158   LEUKOCYTESUR NEGATIVE 10/15/2020 1158   LEUKOCYTESUR Negative 11/12/2014 0232    Radiological Exams on Admission: DG Pelvis 1-2 Views  Result Date: 10/15/2020 CLINICAL DATA:  Fall EXAM: RIGHT FEMUR 2 VIEWS; PELVIS - 1-2 VIEW COMPARISON:  05/11/2017 FINDINGS: Patient is status post right total hip arthroplasty. There is an  acute periprosthetic spiral fracture of the subtrochanteric aspect of the proximal right femur. Fracture is medially displaced by up to 1 shaft width. Slight posterior displacement. Osseous structures are markedly demineralized. Partially visualized left hip ORIF hardware. Advanced degenerative changes of the right knee. Prominent vascular calcifications. IMPRESSION: 1. Prior right total hip arthroplasty with an acute, displaced periprosthetic fracture of the subtrochanteric aspect of the proximal right femur. 2. Osteopenia. Electronically Signed   By: Duanne Guess D.O.   On: 10/15/2020 12:51   CT Head Wo Contrast  Result Date: 10/15/2020 CLINICAL DATA:  Head trauma. EXAM: CT HEAD WITHOUT CONTRAST TECHNIQUE: Contiguous axial images were obtained from the base of the skull through the vertex without intravenous contrast. COMPARISON:  May 11, 2017 FINDINGS: Brain: No evidence of acute infarction, hemorrhage, hydrocephalus or mass lesion/mass effect. Mild left frontal hygroma. Vascular: Calcific atherosclerotic disease of the intra cavernous carotid arteries. Skull: Normal. Negative for fracture or focal lesion. Sinuses/Orbits: No acute finding. Other: None. IMPRESSION: 1. No acute intracranial abnormality. 2. Mild left frontal hygroma. Electronically Signed   By: Ted Mcalpine M.D.   On: 10/15/2020 13:16   CT Cervical Spine Wo Contrast  Result Date: 10/15/2020 CLINICAL DATA:  Status post fall. EXAM: CT CERVICAL SPINE WITHOUT  CONTRAST TECHNIQUE: Multidetector CT imaging of the cervical spine was performed without intravenous contrast. Multiplanar CT image reconstructions were also generated. COMPARISON:  None. FINDINGS: Alignment: Normal. Skull base and vertebrae: No acute fracture. No primary bone lesion or focal pathologic process. Soft tissues and spinal canal: No prevertebral fluid or swelling. No visible canal hematoma. Disc levels:  Mild multilevel osteoarthritic changes. Upper chest: Negative. Other: None. IMPRESSION: 1. No evidence of acute traumatic injury to the cervical spine. 2. Mild multilevel osteoarthritic changes of the cervical spine. Electronically Signed   By: Ted Mcalpine M.D.   On: 10/15/2020 13:20   DG Femur Min 2 Views Right  Result Date: 10/15/2020 CLINICAL DATA:  Fall EXAM: RIGHT FEMUR 2 VIEWS; PELVIS - 1-2 VIEW COMPARISON:  05/11/2017 FINDINGS: Patient is status post right total hip arthroplasty. There is an acute periprosthetic spiral fracture of the subtrochanteric aspect of the proximal right femur. Fracture is medially displaced by up to 1 shaft width. Slight posterior displacement. Osseous structures are markedly demineralized. Partially visualized left hip ORIF hardware. Advanced degenerative changes of the right knee. Prominent vascular calcifications. IMPRESSION: 1. Prior right total hip arthroplasty with an acute, displaced periprosthetic fracture of the subtrochanteric aspect of the proximal right femur. 2. Osteopenia. Electronically Signed   By: Duanne Guess D.O.   On: 10/15/2020 12:51    EKG: Independently reviewed. SR with PVCs  Assessment/Plan Active Problems:   Femur fracture, right (HCC)   Memory loss   Parkinson's disease (tremor, stiffness, slow motion, unstable posture) (HCC)   Diabetes mellitus type 2, uncomplicated (HCC)   Hypothyroidism  (please populate well all problems here in Problem List. (For example, if patient is on BP meds at home and you resume or  decide to hold them, it is a problem that needs to be her. Same for CAD, COPD, HLD and so on)   1. Femur fracture - patient is a non-ambulator. Repair would be palliative - allowing greater comfort and ease of mobility for ADLs, e.g. bathing, toileting.  Plan Med-surg admit  Heparin for DVT prophylaxis  Dr. Carola Frost to see  2. Parkinson's - advanced. Discussed aspiration risk. Her PCP, Dr. Alphonsus Sias, has been permissive in her diet. Plan Continue home meds  Regular diet  3. DM -  Plan - hold long acting meds in acute setting  Sliding scale coverage  4. Dementia - patient not capable of making complex decisions including operative permission or code status. Discussed with Elouise Munroe POA - she understands that surgery would be palliative. She will discuss Code status with family. Plan Continue home meds  5. UTI - continue rocephin daily  6. Code status - see above. Full code at this time. Patient with a poor long term prognosis.  7. Dispo - patient will return to [facility] when medically stable  DVT prophylaxis: heparin SQ Code Status: full code  Family Communication: spoke at length with Elouise Munroe, HCPOA.  Disposition Plan: return to SNF when stable  Consults called: Ortho - Dr. Carola Frost (with names) Admission status: inpatient    Illene Regulus MD [facility] Pager [phone]  If 7PM-7AM, please contact night-coverage [email or web address] Password [facility]  10/15/2020, 8:13 PM

## 2020-10-15 NOTE — Plan of Care (Signed)
  Problem: Coping: Goal: Level of anxiety will decrease Outcome: Progressing   Problem: Pain Managment: Goal: General experience of comfort will improve Outcome: Progressing   

## 2020-10-15 NOTE — ED Triage Notes (Signed)
pt arrives via ems from [facility]. ems reports fall yesterday, pt not sent out or checked for fall yesterday. facility called today for increase in confusion/AMS. Pt aggressive and yelling out upon arrival. hx of dementia. MD present to assess.

## 2020-10-15 NOTE — ED Notes (Signed)
Report givent to [facility] aND LAUREN AT [place] REPORT GIVEN TO ALL QUESTIONS ANSWERED

## 2020-10-15 NOTE — ED Notes (Signed)
pts niece at bedside all 4 p's addressed , pt awaiting [facility] for transport

## 2020-10-15 NOTE — ED Notes (Addendum)
Abduction splint applied to pt lower extremities at this time. Pt pulse and cap refill wnl. Suction cath also placed on pt at this time. Bed alarm in place and on, yellow fall band, and socks on pt. Family at bedside

## 2020-10-15 NOTE — ED Provider Notes (Signed)
[facility] Emergency Department Provider Note   ____________________________________________   First MD Initiated Contact with Patient 10/15/20 1153     (approximate)  I have reviewed the triage vital signs and the nursing notes.   HISTORY  Chief Complaint Altered Mental Status    HPI Kara Moore is a 85 y.o. female with past medical history of hypertension, hyperlipidemia, diabetes, and Lewy body dementia who presents to the ED for altered mental status.  Patient currently resides at [facility] and history is limited due to her dementia and altered mental status.  Per EMS, patient had a minor fall yesterday that did not require further evaluation.  Since waking up this morning.  Patient has seemed more confused than usual but EMS is unsure what her typical baseline is.  On arrival, patient is yelling out "help me."  She states she is hurting all over but is otherwise unable to provide any history.        Past Medical History:  Diagnosis Date  . Benign essential hypertension   . Confusion   . Diabetes mellitus without complication (HCC)   . Hyperlipidemia LDL goal < 100   . Hypothyroidism   . Macular degeneration   . Memory loss   . Senile osteoporosis     Patient Active Problem List   Diagnosis Date Noted  . Femur fracture, right (HCC) 10/15/2020  . Pressure injury of skin 06/29/2017  . Closed left hip fracture (HCC) 06/28/2017  . Hip fracture, unspecified laterality, closed, initial encounter (HCC) 05/11/2017  . Hip fracture (HCC) 05/11/2017  . Mixed incontinence urge and stress 12/12/2016  . Primary osteoarthritis of both knees 07/21/2014  . Essential hypertension, benign 07/21/2014  . Other and unspecified hyperlipidemia 07/21/2014  . Osteoarthritis of right knee 01/31/2014  . Diabetes mellitus type 2, uncomplicated (HCC) 01/17/2014  . Right calf pain 01/17/2014  . Parkinson's disease (tremor, stiffness, slow motion, unstable  posture) (HCC) 10/19/2013  . Memory loss   . Hypothyroidism   . Macular degeneration   . Senile osteoporosis   . Hyperlipidemia   . Recurrent falls 06/17/2013    Past Surgical History:  Procedure Laterality Date  . ADENOIDECTOMY  2011  . HIP ARTHROPLASTY Right 05/11/2017   Procedure: ARTHROPLASTY BIPOLAR HIP (HEMIARTHROPLASTY);  Surgeon: Lyndle Herrlich, MD;  Location: [facility] ORS;  Service: Orthopedics;  Laterality: Right;  . INTRAMEDULLARY (IM) NAIL INTERTROCHANTERIC Left 06/29/2017   Procedure: INTRAMEDULLARY (IM) NAIL INTERTROCHANTRIC;  Surgeon: Christena Flake, MD;  Location: [facility] ORS;  Service: Orthopedics;  Laterality: Left;    Prior to Admission medications   Medication Sig Start Date End Date Taking? Authorizing Provider  acetaminophen (TYLENOL) 325 MG tablet Take 650 mg by mouth 3 (three) times daily.     [provider]  alum & mag hydroxide-simeth (MINTOX) 200-200-20 MG/5ML suspension Take 30 mLs by mouth every 6 (six) hours as needed for indigestion or heartburn.    [provider]  calcium carbonate (OS-CAL - DOSED IN MG OF ELEMENTAL CALCIUM) 1250 (500 CA) MG tablet Take 1 tablet by mouth daily.     [provider]  carbidopa-levodopa (SINEMET IR) 25-100 MG tablet Take 1 tablet by mouth 4 (four) times daily. Take at 8, 12, 4 PM, 8 PM. 09/12/16   Huston Foley, MD  Cholecalciferol 2000 UNITS CAPS Take 1 capsule (2,000 Units total) by mouth daily. Patient not taking: Reported on 06/28/2017 10/28/14   Bufford Spikes L, DO  docusate sodium (COLACE) 100  MG capsule Take 1 capsule (100 mg total) by mouth 2 (two) times daily. 07/01/17   Altamese Dilling, MD  enoxaparin (LOVENOX) 40 MG/0.4ML injection Inject 0.4 mLs (40 mg total) into the skin daily. 07/02/17 07/14/17  Altamese Dilling, MD  fluticasone (FLONASE) 50 MCG/ACT nasal spray Place 2 sprays into the nose as needed.     [provider]  furosemide (LASIX) 20 MG tablet Take 10 mg by mouth  daily. Take 1/2 tablet orally every day    [provider]  glipiZIDE (GLUCOTROL) 5 MG tablet Take 5 mg by mouth daily before breakfast. For Diabetes (elevated blood sugar) 10/18/13   Reed, Tiffany L, DO  GLUCERNA (GLUCERNA) LIQD Take 237 mLs by mouth 2 (two) times daily between meals. 07/01/17   Altamese Dilling, MD  guaifenesin (ROBITUSSIN) 100 MG/5ML syrup Take 200 mg by mouth 4 (four) times daily as needed for cough.    [provider]  levothyroxine (SYNTHROID, LEVOTHROID) 50 MCG tablet TAKE 1 TABLET BY MOUTH DAILY BEFORE BREAKFAST ON EMPTY STOMACH *BINGO CARD* 11/03/14   Sharon Seller, NP  magnesium hydroxide (MILK OF MAGNESIA) 400 MG/5ML suspension Take by mouth daily as needed for constipation.    [provider]  memantine (NAMENDA) 5 MG tablet Take 1 tablet (5 mg total) by mouth 2 (two) times daily. 12/12/16   Reed, Tiffany L, DO  metFORMIN (GLUCOPHAGE) 500 MG tablet Take 1 tablet (500 mg total) by mouth 2 (two) times daily with a meal. 01/30/15   Reed, Tiffany L, DO  mirtazapine (REMERON) 7.5 MG tablet Take 1 tablet (7.5 mg total) by mouth at bedtime. Patient not taking: Reported on 06/28/2017 02/05/16   Bufford Spikes L, DO  Multiple Vitamins-Minerals (DECUBI-VITE PO) Take 1 capsule by mouth daily.    [provider]  Multiple Vitamins-Minerals (ICAPS MV) TABS Take 2 tablets by mouth daily.     [provider]  oxyCODONE (OXY IR/ROXICODONE) 5 MG immediate release tablet Take 1 tablet (5 mg total) by mouth every 4 (four) hours as needed for breakthrough pain. 07/01/17   Altamese Dilling, MD  pantoprazole (PROTONIX) 40 MG tablet Take 1 tablet (40 mg total) by mouth daily. 07/02/17   Altamese Dilling, MD  potassium chloride (K-DUR,KLOR-CON) 10 MEQ tablet Take 10 mEq by mouth daily. Take to prevent potassium loss (due to taking fluid pill)    [provider]  pravastatin (PRAVACHOL) 80 MG tablet Take 1 tablet (80 mg total) by  mouth daily. For Cholesterol 02/05/16   Reed, Tiffany L, DO  ranitidine (ZANTAC) 150 MG capsule Take 150 mg by mouth at bedtime.     [provider]  Skin Protectants, Misc. (DIMETHICONE-ZINC OXIDE) cream Apply topically 3 (three) times daily.    [provider]  traMADol Janean Sark) 50 MG tablet Take one tablet by mouth twice daily for pain as needed andTake one tablet by mouth at bedtime for pain 05/14/17   Enedina Finner, MD    Allergies Patient has no known allergies.  Family History  Problem Relation Age of Onset  . Heart attack Father   . Cancer Sister   . Heart attack Brother   . Diabetes Brother   . Tremor Maternal Grandmother   . Diabetes Paternal Grandfather   . Tremor Maternal Aunt   . Diabetes Paternal Grandmother     Social History Social History   Tobacco Use  . Smoking status: Never Smoker  . Smokeless tobacco: Never Used  Vaping Use  . Vaping  Use: Never used  Substance Use Topics  . Alcohol use: No    Alcohol/week: 0.0 standard drinks  . Drug use: No    Review of Systems Unable to obtain secondary to altered mental status and dementia.  ____________________________________________   PHYSICAL EXAM:  VITAL SIGNS: ED Triage Vitals [10/15/20 1150]  Enc Vitals Group     BP      Pulse      Resp      Temp      Temp src      SpO2      Weight 124 lb 9 oz (56.5 kg)     Height 5\' 4"  (1.626 m)     Head Circumference      Peak Flow      Pain Score      Pain Loc      Pain Edu?      Excl. in GC?     Constitutional: Awake and alert, disoriented to person, place, and time. Eyes: Conjunctivae are normal.  Pupils equal round and reactive to light bilaterally. Head: Atraumatic. Nose: No congestion/rhinnorhea. Mouth/Throat: Mucous membranes are moist. Neck: Normal ROM Cardiovascular: Normal rate, regular rhythm. Grossly normal heart sounds. Respiratory: Normal respiratory effort.  No retractions. Lungs CTAB. Gastrointestinal: Soft and  nontender. No distention. Genitourinary: deferred Musculoskeletal: No lower extremity tenderness nor edema. Neurologic:  Normal speech and language. No gross focal neurologic deficits are appreciated. Skin:  Skin is warm, dry and intact. No rash noted. Psychiatric: Mood and affect are normal. Speech and behavior are normal.  ____________________________________________   LABS (all labs ordered are listed, but only abnormal results are displayed)  Labs Reviewed  CBC WITH DIFFERENTIAL/PLATELET - Abnormal; Notable for the following components:      Result Value   Platelets 401 (*)    All other components within normal limits  URINALYSIS, COMPLETE (UACMP) WITH MICROSCOPIC - Abnormal; Notable for the following components:   Color, Urine YELLOW (*)    APPearance HAZY (*)    Ketones, ur 20 (*)    Nitrite POSITIVE (*)    Bacteria, UA MANY (*)    All other components within normal limits  COMPREHENSIVE METABOLIC PANEL - Abnormal; Notable for the following components:   Glucose, Bld 191 (*)    All other components within normal limits  RESP PANEL BY RT-PCR (FLU A&B, COVID) ARPGX2  URINE CULTURE   ____________________________________________  EKG  ED ECG REPORT I, , the attending physician, personally viewed and interpreted this ECG.   Date: 10/15/2020  EKG Time: 12:00  Rate: 89  Rhythm: normal sinus rhythm, PVC  Axis: Normal  Intervals:none  ST&T Change: None   PROCEDURES  Procedure(s) performed (including Critical Care):  Procedures   ____________________________________________   INITIAL IMPRESSION / ASSESSMENT AND PLAN / ED COURSE       85 year old female with past medical history of hypertension, hyperlipidemia, diabetes, and Lewy body dementia who presents to the ED for altered mental status after reportedly falling yesterday.  She is awake and alert, but disoriented on arrival, no apparent focal neurologic deficits as patient is moving all  extremities equally.  Given her reported fall, we will further assess with CT head and C-spine.  Also check EKG, labs, and UA.  ----------------------------------------- 12:13 PM on 10/15/2020 -----------------------------------------  While staff was attempting to position patient for straight cath to obtain urine sample, minimal force was applied to patient's right leg, but unfortunately there was a pop followed by obvious deformity to her  right mid thigh.  We will further assess with x-ray and treat pain with IV morphine.  No other apparent extremity injuries at this time.  She remains neurovascularly intact to her right lower extremity with 2+ DP pulse.  ----------------------------------------- 1:31 PM on 10/15/2020 -----------------------------------------  X-ray of right femur confirms midshaft fracture in the area of prior hip prosthesis.  Patient appears well comfortable following dose of IV morphine.  Case discussed with Dr. Joice LoftsPoggi of orthopedics here at [facility], who states patient will be most appropriately cared for at a tertiary center.  Case discussed with Dr. Carola FrostHandy of [facility]opedics at [facility] who agrees to care for patient pending transfer.  Case discussed with Dr. Debby BudNorins, hospital[facility], who accepts patient for transfer.  Work-up is remarkable for UTI, which likely explains her altered mental status.  CT head and C-spine reviewed by me, no obvious hemorrhage and no acute process per radiology.  We will treat UTI with Rocephin, patient currently awaiting bed availability at [facility].      ____________________________________________   FINAL CLINICAL IMPRESSION(S) / ED DIAGNOSES  Final diagnoses:  Altered mental status, unspecified altered mental status type  Lower urinary tract infectious disease  Closed fracture of right femur, unspecified fracture morphology, unspecified portion of femur, initial encounter [facility](HCC)     ED Discharge Orders    None        Note:  This document was prepared using Dragon voice recognition software and may include unintentional dictation errors.   Chesley NoonJessup, Charmin Aguiniga, MD 10/15/20 1511

## 2020-10-15 NOTE — Plan of Care (Signed)
Patient arrived to floor. VS stable. On-call notified for orders. Patient resting well. Will continue to monitor patient.     Problem: Education: Goal: Knowledge of General Education information will improve Description: Including pain rating scale, medication(s)/side effects and non-pharmacologic comfort measures Outcome: Progressing   Problem: Pain Managment: Goal: General experience of comfort will improve Outcome: Progressing   Problem: Safety: Goal: Ability to remain free from injury will improve Outcome: Progressing   Problem: Skin Integrity: Goal: Risk for impaired skin integrity will decrease Outcome: Progressing

## 2020-10-15 NOTE — Procedures (Signed)
Pt refusing Xray. Pt will not uncross arms.

## 2020-10-16 ENCOUNTER — Inpatient Hospital Stay (HOSPITAL_COMMUNITY): Payer: Medicare Other

## 2020-10-16 ENCOUNTER — Inpatient Hospital Stay (HOSPITAL_COMMUNITY): Payer: Medicare Other | Admitting: Anesthesiology

## 2020-10-16 ENCOUNTER — Encounter (HOSPITAL_COMMUNITY)
Admission: AD | Disposition: A | Discharge status: Hospice | Payer: Self-pay | Source: Other Acute Inpatient Hospital | Attending: Student

## 2020-10-16 ENCOUNTER — Inpatient Hospital Stay: Admit: 2020-10-16 | Payer: Medicare Other | Admitting: Orthopedic Surgery

## 2020-10-16 ENCOUNTER — Encounter (HOSPITAL_COMMUNITY): Payer: Self-pay | Admitting: Internal Medicine

## 2020-10-16 DIAGNOSIS — N179 Acute kidney failure, unspecified: Secondary | ICD-10-CM

## 2020-10-16 DIAGNOSIS — W19XXXA Unspecified fall, initial encounter: Secondary | ICD-10-CM

## 2020-10-16 DIAGNOSIS — Z7189 Other specified counseling: Secondary | ICD-10-CM

## 2020-10-16 DIAGNOSIS — N390 Urinary tract infection, site not specified: Secondary | ICD-10-CM

## 2020-10-16 DIAGNOSIS — D649 Anemia, unspecified: Secondary | ICD-10-CM

## 2020-10-16 DIAGNOSIS — Y92129 Unspecified place in nursing home as the place of occurrence of the external cause: Secondary | ICD-10-CM

## 2020-10-16 DIAGNOSIS — S72001A Fracture of unspecified part of neck of right femur, initial encounter for closed fracture: Secondary | ICD-10-CM

## 2020-10-16 DIAGNOSIS — I10 Essential (primary) hypertension: Secondary | ICD-10-CM

## 2020-10-16 HISTORY — PX: ORIF FEMUR FRACTURE: SHX2119

## 2020-10-16 LAB — GLUCOSE, CAPILLARY
Glucose-Capillary: 120 mg/dL — ABNORMAL HIGH (ref 70–99)
Glucose-Capillary: 154 mg/dL — ABNORMAL HIGH (ref 70–99)
Glucose-Capillary: 164 mg/dL — ABNORMAL HIGH (ref 70–99)
Glucose-Capillary: 169 mg/dL — ABNORMAL HIGH (ref 70–99)
Glucose-Capillary: 194 mg/dL — ABNORMAL HIGH (ref 70–99)
Glucose-Capillary: 79 mg/dL (ref 70–99)

## 2020-10-16 LAB — HEMOGLOBIN A1C
Hgb A1c MFr Bld: 6.7 % — ABNORMAL HIGH (ref 4.8–5.6)
Mean Plasma Glucose: 145.59 mg/dL

## 2020-10-16 LAB — BASIC METABOLIC PANEL
Anion gap: 13 (ref 5–15)
BUN: 18 mg/dL (ref 8–23)
CO2: 24 mmol/L (ref 22–32)
Calcium: 9 mg/dL (ref 8.9–10.3)
Chloride: 101 mmol/L (ref 98–111)
Creatinine, Ser: 1.11 mg/dL — ABNORMAL HIGH (ref 0.44–1.00)
GFR, Estimated: 49 mL/min — ABNORMAL LOW (ref >60)
Glucose, Bld: 176 mg/dL — ABNORMAL HIGH (ref 70–99)
Potassium: 4.3 mmol/L (ref 3.5–5.1)
Sodium: 138 mmol/L (ref 135–145)

## 2020-10-16 LAB — CBC
HCT: 30.5 % — ABNORMAL LOW (ref 36.0–46.0)
Hemoglobin: 9.9 g/dL — ABNORMAL LOW (ref 12.0–15.0)
MCH: 31 pg (ref 26.0–34.0)
MCHC: 32.5 g/dL (ref 30.0–36.0)
MCV: 95.6 fL (ref 80.0–100.0)
Platelets: 315 10*3/uL (ref 150–400)
RBC: 3.19 MIL/uL — ABNORMAL LOW (ref 3.87–5.11)
RDW: 14.3 % (ref 11.5–15.5)
WBC: 9.2 10*3/uL (ref 4.0–10.5)
nRBC: 0 % (ref 0.0–0.2)

## 2020-10-16 LAB — SURGICAL PCR SCREEN
MRSA, PCR: NEGATIVE
Staphylococcus aureus: NEGATIVE

## 2020-10-16 LAB — VITAMIN D 25 HYDROXY (VIT D DEFICIENCY, FRACTURES): Vit D, 25-Hydroxy: 16.75 ng/mL — ABNORMAL LOW (ref 30–100)

## 2020-10-16 SURGERY — OPEN REDUCTION INTERNAL FIXATION (ORIF) DISTAL FEMUR FRACTURE
Anesthesia: General | Laterality: Right

## 2020-10-16 MED ORDER — LACTATED RINGERS IV SOLN: INTRAVENOUS | Status: DC | PRN

## 2020-10-16 MED ORDER — PROPOFOL 10 MG/ML IV BOLUS
INTRAVENOUS | Status: DC | PRN
  Administered 2020-10-16: 30 mg via INTRAVENOUS
  Administered 2020-10-16: 60 mg via INTRAVENOUS

## 2020-10-16 MED ORDER — CHLORHEXIDINE GLUCONATE 4 % EX LIQD: 60.0000 mL | Freq: Once | CUTANEOUS | Status: DC

## 2020-10-16 MED ORDER — CEFAZOLIN SODIUM-DEXTROSE 2-4 GM/100ML-% IV SOLN: 2.0000 g | INTRAVENOUS | Status: DC

## 2020-10-16 MED ORDER — CHLORHEXIDINE GLUCONATE CLOTH 2 % EX PADS
6.0000 | MEDICATED_PAD | Freq: Every day | CUTANEOUS | Status: DC
  Administered 2020-10-16 – 2020-10-19 (×4): 6 via TOPICAL

## 2020-10-16 MED ORDER — MORPHINE SULFATE (PF) 2 MG/ML IV SOLN
2.0000 mg | INTRAVENOUS | Status: DC | PRN
  Administered 2020-10-16: 2 mg via INTRAVENOUS
  Filled 2020-10-16: qty 1

## 2020-10-16 MED ORDER — VITAMIN D (ERGOCALCIFEROL) 1.25 MG (50000 UNIT) PO CAPS
50000.0000 [IU] | ORAL_CAPSULE | ORAL | Status: DC
  Administered 2020-10-16: 50000 [IU] via ORAL
  Filled 2020-10-16: qty 1

## 2020-10-16 MED ORDER — ONDANSETRON HCL 4 MG/2ML IJ SOLN
INTRAMUSCULAR | Status: DC | PRN
  Administered 2020-10-16: 4 mg via INTRAVENOUS

## 2020-10-16 MED ORDER — LIDOCAINE 2% (20 MG/ML) 5 ML SYRINGE
INTRAMUSCULAR | Status: DC | PRN
  Administered 2020-10-16: 40 mg via INTRAVENOUS

## 2020-10-16 MED ORDER — SUGAMMADEX SODIUM 200 MG/2ML IV SOLN
INTRAVENOUS | Status: DC | PRN
  Administered 2020-10-16: 200 mg via INTRAVENOUS

## 2020-10-16 MED ORDER — 0.9 % SODIUM CHLORIDE (POUR BTL) OPTIME
TOPICAL | Status: DC | PRN
  Administered 2020-10-16: 1000 mL

## 2020-10-16 MED ORDER — SODIUM CHLORIDE 0.9 % IV SOLN
1.0000 g | INTRAVENOUS | Status: AC
  Administered 2020-10-16 – 2020-10-18 (×3): 1 g via INTRAVENOUS
  Filled 2020-10-16 (×3): qty 10

## 2020-10-16 MED ORDER — ENSURE ENLIVE PO LIQD: 237.0000 mL | Freq: Two times a day (BID) | ORAL | Status: DC

## 2020-10-16 MED ORDER — ADULT MULTIVITAMIN W/MINERALS CH
1.0000 | ORAL_TABLET | Freq: Every day | ORAL | Status: DC
  Administered 2020-10-17: 1 via ORAL
  Filled 2020-10-16: qty 1

## 2020-10-16 MED ORDER — POVIDONE-IODINE 10 % EX SWAB: 2.0000 "application " | Freq: Once | CUTANEOUS | Status: DC

## 2020-10-16 MED ORDER — FENTANYL CITRATE (PF) 250 MCG/5ML IJ SOLN
INTRAMUSCULAR | Status: AC
  Filled 2020-10-16: qty 5

## 2020-10-16 MED ORDER — DEXAMETHASONE SODIUM PHOSPHATE 10 MG/ML IJ SOLN
INTRAMUSCULAR | Status: DC | PRN
  Administered 2020-10-16: 8 mg via INTRAVENOUS

## 2020-10-16 MED ORDER — ALBUMIN HUMAN 5 % IV SOLN: INTRAVENOUS | Status: DC | PRN

## 2020-10-16 MED ORDER — ROCURONIUM BROMIDE 10 MG/ML (PF) SYRINGE
PREFILLED_SYRINGE | INTRAVENOUS | Status: DC | PRN
  Administered 2020-10-16: 60 mg via INTRAVENOUS
  Administered 2020-10-16: 40 mg via INTRAVENOUS

## 2020-10-16 MED ORDER — VITAMIN D 25 MCG (1000 UNIT) PO TABS
2000.0000 [IU] | ORAL_TABLET | Freq: Two times a day (BID) | ORAL | Status: DC
  Administered 2020-10-16 – 2020-10-17 (×3): 2000 [IU] via ORAL
  Filled 2020-10-16 (×5): qty 2

## 2020-10-16 MED ORDER — FENTANYL CITRATE (PF) 250 MCG/5ML IJ SOLN
INTRAMUSCULAR | Status: DC | PRN
  Administered 2020-10-16 (×2): 50 ug via INTRAVENOUS

## 2020-10-16 MED ORDER — SODIUM CHLORIDE 0.9 % IV SOLN: INTRAVENOUS | Status: DC

## 2020-10-16 MED ORDER — CEFAZOLIN SODIUM-DEXTROSE 2-4 GM/100ML-% IV SOLN
2.0000 g | Freq: Three times a day (TID) | INTRAVENOUS | Status: DC
  Administered 2020-10-16 – 2020-10-17 (×2): 2 g via INTRAVENOUS
  Filled 2020-10-16 (×2): qty 100

## 2020-10-16 MED ORDER — CHLORHEXIDINE GLUCONATE 0.12 % MT SOLN
15.0000 mL | Freq: Once | OROMUCOSAL | Status: DC
  Filled 2020-10-16: qty 15

## 2020-10-16 MED ORDER — PHENYLEPHRINE HCL-NACL 10-0.9 MG/250ML-% IV SOLN
INTRAVENOUS | Status: DC | PRN
  Administered 2020-10-16: 30 ug/min via INTRAVENOUS

## 2020-10-16 SURGICAL SUPPLY — 68 items
BIT DRILL 4.3 (BIT) ×1 IMPLANT
BIT DRILL QC 3.3X195 (BIT) ×3 IMPLANT
BNDG ELASTIC 4X5.8 VLCR STR LF (GAUZE/BANDAGES/DRESSINGS) IMPLANT
BNDG ELASTIC 6X5.8 VLCR STR LF (GAUZE/BANDAGES/DRESSINGS) IMPLANT
BNDG GAUZE ELAST 4 BULKY (GAUZE/BANDAGES/DRESSINGS) IMPLANT
BRUSH SCRUB EZ PLAIN DRY (MISCELLANEOUS) ×6 IMPLANT
CANISTER SUCT 3000ML PPV (MISCELLANEOUS) ×3 IMPLANT
CAP LOCK NCB (Cap) ×27 IMPLANT
COVER SURGICAL LIGHT HANDLE (MISCELLANEOUS) ×3 IMPLANT
DRAPE C-ARM 42X72 X-RAY (DRAPES) ×3 IMPLANT
DRAPE C-ARMOR (DRAPES) ×3 IMPLANT
DRAPE IMP U-DRAPE 54X76 (DRAPES) ×3 IMPLANT
DRAPE ORTHO SPLIT 77X108 STRL (DRAPES) ×6
DRAPE SURG ORHT 6 SPLT 77X108 (DRAPES) ×3 IMPLANT
DRAPE U-SHAPE 47X51 STRL (DRAPES) ×3 IMPLANT
DRILL BIT 4.3 (BIT) ×2
DRSG ADAPTIC 3X8 NADH LF (GAUZE/BANDAGES/DRESSINGS) ×3 IMPLANT
DRSG MEPILEX BORDER 4X12 (GAUZE/BANDAGES/DRESSINGS) ×3 IMPLANT
DRSG MEPILEX BORDER 4X8 (GAUZE/BANDAGES/DRESSINGS) ×3 IMPLANT
DRSG PAD ABDOMINAL 8X10 ST (GAUZE/BANDAGES/DRESSINGS) IMPLANT
ELECT REM PT RETURN 9FT ADLT (ELECTROSURGICAL) ×3
ELECTRODE REM PT RTRN 9FT ADLT (ELECTROSURGICAL) ×1 IMPLANT
FIBERTAPE CERCLAGE SUTURE ×6 IMPLANT
GAUZE SPONGE 4X4 12PLY STRL (GAUZE/BANDAGES/DRESSINGS) IMPLANT
GLOVE BIO SURGEON STRL SZ7.5 (GLOVE) ×3 IMPLANT
GLOVE BIO SURGEON STRL SZ8 (GLOVE) ×6 IMPLANT
GLOVE BIOGEL PI IND STRL 7.5 (GLOVE) ×2 IMPLANT
GLOVE BIOGEL PI IND STRL 8 (GLOVE) ×1 IMPLANT
GLOVE BIOGEL PI INDICATOR 7.5 (GLOVE) ×4
GLOVE BIOGEL PI INDICATOR 8 (GLOVE) ×2
GOWN STRL REUS W/ TWL LRG LVL3 (GOWN DISPOSABLE) ×2 IMPLANT
GOWN STRL REUS W/ TWL XL LVL3 (GOWN DISPOSABLE) ×2 IMPLANT
GOWN STRL REUS W/TWL LRG LVL3 (GOWN DISPOSABLE) ×4
GOWN STRL REUS W/TWL XL LVL3 (GOWN DISPOSABLE) ×4
KIT BASIN OR (CUSTOM PROCEDURE TRAY) ×3 IMPLANT
KIT TURNOVER KIT B (KITS) ×3 IMPLANT
NS IRRIG 1000ML POUR BTL (IV SOLUTION) ×3 IMPLANT
PACK TOTAL JOINT (CUSTOM PROCEDURE TRAY) ×3 IMPLANT
PACK UNIVERSAL I (CUSTOM PROCEDURE TRAY) ×3 IMPLANT
PAD ARMBOARD 7.5X6 YLW CONV (MISCELLANEOUS) ×6 IMPLANT
PAD CAST 4YDX4 CTTN HI CHSV (CAST SUPPLIES) IMPLANT
PADDING CAST COTTON 4X4 STRL (CAST SUPPLIES)
PADDING CAST COTTON 6X4 STRL (CAST SUPPLIES) IMPLANT
PLATE PROXIMAL FEMUR 12H RT (Plate) ×3 IMPLANT
SCREW NCB 4.0 32MM (Screw) ×9 IMPLANT
SCREW NCB 4.0MX30M (Screw) ×3 IMPLANT
SCREW NCB 4.0MX34M (Screw) ×9 IMPLANT
SCREW NCB 4.0MX48M (Screw) ×3 IMPLANT
SCREW UNI CORTICAL 5.0X14MM (Screw) ×4 IMPLANT
SCREW UNICORTICAL 5.0X14 (Screw) ×2 IMPLANT
SPONGE LAP 18X18 RF (DISPOSABLE) ×6 IMPLANT
STAPLER VISISTAT 35W (STAPLE) ×3 IMPLANT
SUCTION FRAZIER HANDLE 10FR (MISCELLANEOUS) ×2
SUCTION TUBE FRAZIER 10FR DISP (MISCELLANEOUS) ×1 IMPLANT
SUT ETHILON 2 0 FS 18 (SUTURE) ×9 IMPLANT
SUT FIBERTAPE CERCLAGE 2 48 (SUTURE) ×3 IMPLANT
SUT TIGERTAPE CERCLAGE (Miscellaneous) ×6 IMPLANT
SUT VIC AB 0 CT1 27 (SUTURE) ×10
SUT VIC AB 0 CT1 27XBRD ANBCTR (SUTURE) ×5 IMPLANT
SUT VIC AB 1 CT1 18XCR BRD 8 (SUTURE) ×2 IMPLANT
SUT VIC AB 1 CT1 27 (SUTURE) ×4
SUT VIC AB 1 CT1 27XBRD ANBCTR (SUTURE) ×2 IMPLANT
SUT VIC AB 1 CT1 8-18 (SUTURE) ×4
SUT VIC AB 2-0 CT1 27 (SUTURE) ×4
SUT VIC AB 2-0 CT1 TAPERPNT 27 (SUTURE) ×2 IMPLANT
TOWEL GREEN STERILE (TOWEL DISPOSABLE) ×6 IMPLANT
TOWEL GREEN STERILE FF (TOWEL DISPOSABLE) ×3 IMPLANT
WATER STERILE IRR 1000ML POUR (IV SOLUTION) ×6 IMPLANT

## 2020-10-16 NOTE — Anesthesia Postprocedure Evaluation (Signed)
Anesthesia Post Note  Patient: Kara Moore  Procedure(s) Performed: OPEN REDUCTION INTERNAL FIXATION (ORIF) DISTAL FEMUR FRACTURE (Right )     Patient location during evaluation: PACU Anesthesia Type: General Level of consciousness: awake Pain management: pain level controlled Respiratory status: spontaneous breathing Cardiovascular status: stable Postop Assessment: no apparent nausea or vomiting Anesthetic complications: no   No complications documented.  Last Vitals:  Vitals:   10/16/20 0514 10/16/20 0900  BP: 139/80 130/85  Pulse: 60 87  Resp: 17 16  Temp: 36.7 C 37.5 C  SpO2: 96% 96%    Last Pain:  Vitals:   10/16/20 0900  TempSrc: Oral                 Katrina Daddona

## 2020-10-16 NOTE — Anesthesia Preprocedure Evaluation (Addendum)
Anesthesia Evaluation  Patient identified by MRN, date of birth, ID band Patient awake  General Assessment Comment:DNR status note, I discussed DNR status with POA in detail in preop prior to surgery who wishes Full Code except no chest compression if warranted. Dorris Singh, MD  Reviewed: Allergy & Precautions, H&P , NPO status , Patient's Chart, lab work & pertinent test results, reviewed documented beta blocker date and time   Airway Mallampati: II  TM Distance: >3 FB Neck ROM: full    Dental no notable dental hx.    Pulmonary neg pulmonary ROS,    Pulmonary exam normal breath sounds clear to auscultation       Cardiovascular Exercise Tolerance: Good hypertension, negative cardio ROS   Rhythm:regular Rate:Normal     Neuro/Psych negative neurological ROS  negative psych ROS   GI/Hepatic negative GI ROS, Neg liver ROS,   Endo/Other  diabetesHypothyroidism   Renal/GU negative Renal ROS  negative genitourinary   Musculoskeletal   Abdominal   Peds  Hematology negative hematology ROS (+)   Anesthesia Other Findings   Reproductive/Obstetrics negative OB ROS                                                             Anesthesia Evaluation  Patient identified by MRN, date of birth, ID band Patient awake    Reviewed: Allergy & Precautions, NPO status , Patient's Chart, lab work & pertinent test results  History of Anesthesia Complications Negative for: history of anesthetic complications  Airway Mallampati: III  TM Distance: >3 FB Neck ROM: Full    Dental  (+) Poor Dentition   Pulmonary neg pulmonary ROS, neg sleep apnea, neg COPD,    breath sounds clear to auscultation- rhonchi (-) wheezing      Cardiovascular hypertension, Pt. on medications (-) angina(-) CAD, (-) Past MI and (-) Cardiac Stents  Rhythm:Regular Rate:Normal - Systolic murmurs and - Diastolic  murmurs    Neuro/Psych PSYCHIATRIC DISORDERS Parkinson's disease     GI/Hepatic negative GI ROS, Neg liver ROS,   Endo/Other  diabetes, Oral Hypoglycemic AgentsHypothyroidism   Renal/GU negative Renal ROS     Musculoskeletal  (+) Arthritis ,   Abdominal (+) - obese,   Peds  Hematology negative hematology ROS (+)   Anesthesia Other Findings Past Medical History: No date: Benign essential hypertension No date: Confusion No date: Diabetes mellitus without complication (HCC) No date: Hyperlipidemia LDL goal < 100 No date: Hypothyroidism No date: Macular degeneration No date: Memory loss No date: Senile osteoporosis   Reproductive/Obstetrics                             Anesthesia Physical  Anesthesia Plan  ASA: III  Anesthesia Plan: Spinal   Post-op Pain Management:    Induction:   PONV Risk Score and Plan: 2 and Ondansetron and Propofol  Airway Management Planned: Natural Airway  Additional Equipment:   Intra-op Plan:   Post-operative Plan:   Informed Consent: I have reviewed the patients History and Physical, chart, labs and discussed the procedure including the risks, benefits and alternatives for the proposed anesthesia with the patient or authorized representative who has indicated his/her understanding and acceptance.   Dental advisory given  Plan Discussed with: Anesthesiologist and  CRNA  Anesthesia Plan Comments:         Lab Results  Component Value Date   WBC 9.2 10/16/2020   HGB 9.9 (L) 10/16/2020   HCT 30.5 (L) 10/16/2020   MCV 95.6 10/16/2020   PLT 315 10/16/2020    Anesthesia Quick Evaluation  Anesthesia Physical Anesthesia Plan  ASA: IV  Anesthesia Plan: General   Post-op Pain Management:    Induction: Intravenous  PONV Risk Score and Plan: 3 and Ondansetron and Treatment may vary due to age or medical condition  Airway Management Planned: Oral ETT  Additional Equipment:   Intra-op  Plan:   Post-operative Plan:   Informed Consent: I have reviewed the patients History and Physical, chart, labs and discussed the procedure including the risks, benefits and alternatives for the proposed anesthesia with the patient or authorized representative who has indicated his/her understanding and acceptance.   Patient has DNR.  Discussed DNR with power of attorney.   Dental Advisory Given  Plan Discussed with: CRNA and Anesthesiologist  Anesthesia Plan Comments:        Anesthesia Quick Evaluation

## 2020-10-16 NOTE — Progress Notes (Addendum)
Initial Nutrition Assessment  DOCUMENTATION CODES:   Not applicable  INTERVENTION:   Once diet is advanced, add:  -Ensure Enlive po BID, each supplement provides 350 kcal and 20 grams of protein -MVI with minerals daily -Feeding assistance with meals  NUTRITION DIAGNOSIS:   Increased nutrient needs related to post-op healing as evidenced by estimated needs.  GOAL:   Patient will meet greater than or equal to 90% of their needs  MONITOR:   PO intake, Supplement acceptance, Diet advancement, Labs, Weight trends, Skin, I & O's  REASON FOR ASSESSMENT:   Consult, Malnutrition Screening Tool Assessment of nutrition requirement/status, Hip fracture protocol  ASSESSMENT:   Kara Moore is a 85 y.o. female with medical history significant of  hypertension, hyperlipidemia, diabetes, and Lewy body dementia who presents to the ED for altered mental status  Pt admitted with lt femur fracture s/p fall.   Per orthopedics notes, plan for ORIF today. Pt is currently NPO for procedure.   Per chart review, pt was a resident of [facility]. Pt unable to provide further nutrition-related history. She did not arouse when name was called or when examined.   Reviewed wt hx; wt has been stable over the past year, however, noted distant hx of weight loss.   Given dementia, advanced age, and increased nutritional needs for post-operative healing, pt would greatly benefit from addition of oral nutrition supplements.   Medications reviewed and include sinemet, remeron, and lactated ringers @ 50 ml/hr.   Lab Results  Component Value Date   HGBA1C 6.7 (H) 10/16/2020   PTA DM medications are 5 mg glipizide daily and 500 metformin BID.   Labs reviewed: CBGS: 79-164 (inpatient orders for glycemic control are 0-15 units insulin aspart TID with meals).   NUTRITION - FOCUSED PHYSICAL EXAM:    Most Recent Value  Orbital Region No depletion  Upper Arm Region Mild depletion  Thoracic and  Lumbar Region No depletion  Buccal Region No depletion  Temple Region No depletion  Clavicle Bone Region No depletion  Clavicle and Acromion Bone Region No depletion  Scapular Bone Region No depletion  Dorsal Hand Mild depletion  Patellar Region No depletion  Anterior Thigh Region No depletion  Posterior Calf Region No depletion  Edema (RD Assessment) Mild  Hair Reviewed  Eyes Reviewed  Mouth Reviewed  Skin Reviewed  Nails Reviewed       Diet Order:   Diet Order            Diet NPO time specified  Diet effective now                 EDUCATION NEEDS:   No education needs have been identified at this time  Skin:  Skin Assessment: Reviewed RN Assessment  Last BM:  Unknown  Height:   Ht Readings from Last 1 Encounters:  10/15/20 5\' 4"  (1.626 m)    Weight:   Wt Readings from Last 1 Encounters:  10/15/20 56.5 kg    Ideal Body Weight:  54.5 kg  BMI:  There is no height or weight on file to calculate BMI.  Estimated Nutritional Needs:   Kcal:  1500-1700  Protein:  70-85 grams  Fluid:  > 1.5 L    10/17/20, RD, LDN, CDCES Registered Dietitian II Certified Diabetes Care and Education Specialist Please refer to [facility] for RD and/or RD on-call/weekend/after hours pager

## 2020-10-16 NOTE — Consult Note (Addendum)
Reason for Consult:Right hip fx Referring Physician: Rodman Pickle Time called: [id number] Time at bedside: 0850   Kara Moore is an 85 y.o. female.  HPI: Kara Moore was at the facility where she resides. She suffered a minor fall the day prior to admission but seemed fine from that. She was more confused yesterday and was brought in for evaluation. While they were trying to place a urinary catheter there was a pop and malrotation of the right thigh. X-rays confirmed a femur fx and orthopedic surgery was consulted. She is demented and cannot contribute to history. Family tells me she is nonambulatory baseline.  Past Medical History:  Diagnosis Date  . Benign essential hypertension   . Confusion   . Diabetes mellitus without complication (HCC)   . Hyperlipidemia LDL goal < 100   . Hypothyroidism   . Macular degeneration   . Memory loss   . Senile osteoporosis     Past Surgical History:  Procedure Laterality Date  . ADENOIDECTOMY  2011  . HIP ARTHROPLASTY Right 05/11/2017   Procedure: ARTHROPLASTY BIPOLAR HIP (HEMIARTHROPLASTY);  Surgeon: Lyndle Herrlich, MD;  Location: [facility] ORS;  Service: Orthopedics;  Laterality: Right;  . INTRAMEDULLARY (IM) NAIL INTERTROCHANTERIC Left 06/29/2017   Procedure: INTRAMEDULLARY (IM) NAIL INTERTROCHANTRIC;  Surgeon: Christena Flake, MD;  Location: [facility] ORS;  Service: Orthopedics;  Laterality: Left;    Family History  Problem Relation Age of Onset  . Heart attack Father   . Cancer Sister   . Heart attack Brother   . Diabetes Brother   . Tremor Maternal Grandmother   . Diabetes Paternal Grandfather   . Tremor Maternal Aunt   . Diabetes Paternal Grandmother     Social History:  reports that she has never smoked. She has never used smokeless tobacco. She reports that she does not drink alcohol and does not use drugs.  Allergies: No Known Allergies  Medications: I have reviewed the patient's current medications.  Results for orders placed or performed during  the hospital encounter of 10/15/20 (from the past 48 hour(s))  Type and screen [facility]     Status: None   Collection Time: 10/15/20  8:05 PM  Result Value Ref Range   ABO/RH(D) O NEG    Antibody Screen NEG    Sample Expiration      10/18/2020,2359 Performed at [facility], [address] [place], [place] [address]   Glucose, capillary     Status: Abnormal   Collection Time: 10/16/20 12:10 AM  Result Value Ref Range   Glucose-Capillary 154 (H) 70 - 99 mg/dL    Comment: Glucose reference range applies only to samples taken after fasting for at least 8 hours.  Surgical pcr screen     Status: None   Collection Time: 10/16/20 12:46 AM   Specimen: Nasal Mucosa; Nasal Swab  Result Value Ref Range   MRSA, PCR NEGATIVE NEGATIVE   Staphylococcus aureus NEGATIVE NEGATIVE    Comment: (NOTE) The Xpert SA Assay (FDA approved for NASAL specimens in patients 62 years of age and older), is one component of a comprehensive surveillance program. It is not intended to diagnose infection nor to guide or monitor treatment. Performed at [facility], [address], [place] [place], [place] [address]   Hemoglobin A1c     Status: Abnormal   Collection Time: 10/16/20  4:16 AM  Result Value Ref Range   Hgb A1c MFr Bld 6.7 (H) 4.8 - 5.6 %  Comment: (NOTE) Pre diabetes:          5.7%-6.4%  Diabetes:              >6.4%  Glycemic control for   <7.0% adults with diabetes    Mean Plasma Glucose 145.59 mg/dL    Comment: Performed at [facility], [address], [place], [place] [address]  Basic metabolic panel     Status: Abnormal   Collection Time: 10/16/20  4:16 AM  Result Value Ref Range   Sodium 138 135 - 145 mmol/L   Potassium 4.3 3.5 - 5.1 mmol/L   Chloride 101 98 - 111 mmol/L   CO2 24 22 - 32 mmol/L   Glucose, Bld 176 (H) 70 - 99 mg/dL    Comment: Glucose reference range applies only to samples taken after fasting for at least 8 hours.   BUN 18 8 - 23  mg/dL   Creatinine, Ser 1.79 (H) 0.44 - 1.00 mg/dL   Calcium 9.0 8.9 - 15.0 mg/dL   GFR, Estimated 49 (L) >60 mL/min    Comment: (NOTE) Calculated using the CKD-EPI Creatinine Equation (2021)    Anion gap 13 5 - 15    Comment: Performed at [facility], [address] [place], [place] [address]  CBC     Status: Abnormal   Collection Time: 10/16/20  4:16 AM  Result Value Ref Range   WBC 9.2 4.0 - 10.5 K/uL   RBC 3.19 (L) 3.87 - 5.11 MIL/uL   Hemoglobin 9.9 (L) 12.0 - 15.0 g/dL   HCT 48.0 (L) 36 - 46 %   MCV 95.6 80.0 - 100.0 fL   MCH 31.0 26.0 - 34.0 pg   MCHC 32.5 30.0 - 36.0 g/dL   RDW 16.5 53.7 - 48.2 %   Platelets 315 150 - 400 K/uL   nRBC 0.0 0.0 - 0.2 %    Comment: Performed at [facility], [address], [place], [place] [address]  VITAMIN D 25 Hydroxy (Vit-D Deficiency, Fractures)     Status: Abnormal   Collection Time: 10/16/20  4:16 AM  Result Value Ref Range   Vit D, 25-Hydroxy 16.75 (L) 30 - 100 ng/mL    Comment: (NOTE) Vitamin D deficiency has been defined by the Institute of Medicine  and an Endocrine Society practice guideline as a level of serum 25-OH  vitamin D less than 20 ng/mL (1,2). The Endocrine Society went on to  further define vitamin D insufficiency as a level between 21 and 29  ng/mL (2).  1. IOM (Institute of Medicine). 2010. Dietary reference intakes for  calcium and D. [place]: [employer]. 2. Holick MF, Binkley [place], Bischoff-Ferrari HA, et al. Evaluation,  treatment, and prevention of vitamin D deficiency: an Endocrine  Society clinical practice guideline, JCEM. 2011 Jul; 96(7): 1911-30.  Performed at [facility], [address], [place], [place] [address]   Glucose, capillary     Status: Abnormal   Collection Time: 10/16/20  6:43 AM  Result Value Ref Range   Glucose-Capillary 164 (H) 70 - 99 mg/dL    Comment: Glucose reference range applies only to samples taken after fasting for at least 8  hours.    DG Pelvis 1-2 Views  Result Date: 10/15/2020 CLINICAL DATA:  Fall EXAM: RIGHT FEMUR 2 VIEWS; PELVIS - 1-2 VIEW COMPARISON:  05/11/2017 FINDINGS: Patient is status post right total hip arthroplasty. There is an acute periprosthetic spiral fracture  of the subtrochanteric aspect of the proximal right femur. Fracture is medially displaced by up to 1 shaft width. Slight posterior displacement. Osseous structures are markedly demineralized. Partially visualized left hip ORIF hardware. Advanced degenerative changes of the right knee. Prominent vascular calcifications. IMPRESSION: 1. Prior right total hip arthroplasty with an acute, displaced periprosthetic fracture of the subtrochanteric aspect of the proximal right femur. 2. Osteopenia. Electronically Signed   By: Duanne GuessNicholas  Plundo D.O.   On: 10/15/2020 12:51   CT Head Wo Contrast  Result Date: 10/15/2020 CLINICAL DATA:  Head trauma. EXAM: CT HEAD WITHOUT CONTRAST TECHNIQUE: Contiguous axial images were obtained from the base of the skull through the vertex without intravenous contrast. COMPARISON:  May 11, 2017 FINDINGS: Brain: No evidence of acute infarction, hemorrhage, hydrocephalus or mass lesion/mass effect. Mild left frontal hygroma. Vascular: Calcific atherosclerotic disease of the intra cavernous carotid arteries. Skull: Normal. Negative for fracture or focal lesion. Sinuses/Orbits: No acute finding. Other: None. IMPRESSION: 1. No acute intracranial abnormality. 2. Mild left frontal hygroma. Electronically Signed   By: Ted Mcalpineobrinka  Dimitrova M.D.   On: 10/15/2020 13:16   CT Cervical Spine Wo Contrast  Result Date: 10/15/2020 CLINICAL DATA:  Status post fall. EXAM: CT CERVICAL SPINE WITHOUT CONTRAST TECHNIQUE: Multidetector CT imaging of the cervical spine was performed without intravenous contrast. Multiplanar CT image reconstructions were also generated. COMPARISON:  None. FINDINGS: Alignment: Normal. Skull base and vertebrae: No acute  fracture. No primary bone lesion or focal pathologic process. Soft tissues and spinal canal: No prevertebral fluid or swelling. No visible canal hematoma. Disc levels:  Mild multilevel osteoarthritic changes. Upper chest: Negative. Other: None. IMPRESSION: 1. No evidence of acute traumatic injury to the cervical spine. 2. Mild multilevel osteoarthritic changes of the cervical spine. Electronically Signed   By: Ted Mcalpineobrinka  Dimitrova M.D.   On: 10/15/2020 13:20   Chest Portable 1 View  Result Date: 10/16/2020 CLINICAL DATA:  Femur fracture. EXAM: PORTABLE CHEST 1 VIEW COMPARISON:  June 28, 2017. FINDINGS: Stable cardiomediastinal silhouette. Both lungs are clear. Old right rib fractures are noted. IMPRESSION: No active disease. Electronically Signed   By: Lupita RaiderJames  Green Jr M.D.   On: 10/16/2020 08:11   DG Femur Min 2 Views Right  Result Date: 10/15/2020 CLINICAL DATA:  Fall EXAM: RIGHT FEMUR 2 VIEWS; PELVIS - 1-2 VIEW COMPARISON:  05/11/2017 FINDINGS: Patient is status post right total hip arthroplasty. There is an acute periprosthetic spiral fracture of the subtrochanteric aspect of the proximal right femur. Fracture is medially displaced by up to 1 shaft width. Slight posterior displacement. Osseous structures are markedly demineralized. Partially visualized left hip ORIF hardware. Advanced degenerative changes of the right knee. Prominent vascular calcifications. IMPRESSION: 1. Prior right total hip arthroplasty with an acute, displaced periprosthetic fracture of the subtrochanteric aspect of the proximal right femur. 2. Osteopenia. Electronically Signed   By: Duanne GuessNicholas  Plundo D.O.   On: 10/15/2020 12:51    Review of Systems  Unable to perform ROS: Dementia   Blood pressure 130/85, pulse 87, temperature 99.5 F (37.5 C), temperature source Oral, resp. rate 16, SpO2 96 %. Physical Exam Constitutional:      General: She is not in acute distress.    Appearance: She is well-developed. She is not  diaphoretic.  HENT:     Head: Normocephalic and atraumatic.  Eyes:     General:        Right eye: No discharge.        Left eye: No discharge.  Cardiovascular:  Rate and Rhythm: Normal rate and regular rhythm.  Pulmonary:     Effort: Pulmonary effort is normal. No respiratory distress.  Musculoskeletal:     Cervical back: Normal range of motion.     Comments: LLE No traumatic wounds, ecchymosis, or rash  Pain with movement  No knee or ankle effusion  Knee stable to varus/ valgus and anterior/posterior stress  Sens DPN, SPN, TN could not assess  Motor EHL, ext, flex, evers could not assess  DP 2+, PT 1+, No significant edema  Skin:    General: Skin is warm and dry.  Psychiatric:     Comments: Just moans when stimulated, unintelligible speech     Assessment/Plan: Right periprosthetic hip fx -- Plan ORIF today by Dr. Carola Frost, mainly for palliation. Have spoken to niece, who is one of 2 POA's, and she is inclined to make her DNR but has to get consensus from rest of family. Multiple medical problems including hypertension, hyperlipidemia, diabetes, and Lewy body dementia -- per primary service    Freeman Caldron, PA-C Orthopedic Surgery [phone] 10/16/2020, 9:47 AM

## 2020-10-16 NOTE — Progress Notes (Signed)
Pt unable to sign consent due to altered mental status. Filled up surgical and blood in pt chart. Pt also refused PO meds and xray. Will report to next nurse taking over at 0300  Pt has power of atty (her niece). Elouise Munroe phone number [phone]

## 2020-10-16 NOTE — Progress Notes (Signed)
Pt's niece Elouise Munroe is coming to sign pt's consent for surgery. NPO post mn maint.  Pt to short stay.

## 2020-10-16 NOTE — Transfer of Care (Signed)
Immediate Anesthesia Transfer of Care Note  Patient: Adreonna E Maston  Procedure(s) Performed: OPEN REDUCTION INTERNAL FIXATION (ORIF) DISTAL FEMUR FRACTURE (Right )  Patient Location: PACU  Anesthesia Type:General  Level of Consciousness: drowsy, patient cooperative and confused  Airway & Oxygen Therapy: Patient Spontanous Breathing and Patient connected to face mask oxygen  Post-op Assessment: Report given to RN and Post -op Vital signs reviewed and stable  Post vital signs: Reviewed and stable  Last Vitals:  Vitals Value Taken Time  BP 132/50 10/16/20 1814  Temp    Pulse 53 10/16/20 1815  Resp 12 10/16/20 1815  SpO2 100 % 10/16/20 1815  Vitals shown include unvalidated device data.  Last Pain:  Vitals:   10/16/20 0900  TempSrc: Oral     Pt. Returned to baseline mental status.     Complications: No complications documented.

## 2020-10-16 NOTE — Progress Notes (Signed)
PROGRESS NOTE  Kara Moore VOJ:[id number] DOB: 19-Jul-1935   PCP: Karie Schwalbe, MD  Patient is from: [facility].  Transfer from [facility].  DOA: 10/15/2020 LOS: 1  Chief complaints: Right femoral periprosthetic fracture  Brief Narrative / Interim history: 85 year old female with history of Lewy body dementia, DM-2, HTN, hypothyroidism, HLD and prior right hemiarthroplasty and left intramedullary nailing brought to ED by EMS due to altered mental status and found to have displaced periprosthetic fracture of right femur.  Apparently, she she had "minor fall" the day prior to presentation.  Also concerned about possible UTI.  Orthopedic surgery, Dr. Carola Frost consulted and recommended transfer to [facility] for surgical repair-mainly palliative.  Subjective: Seen and examined this morning no major events overnight or this morning.  She wakes to voice but not oriented to any things.  She could not tell me her name.  Does not appear to be in distress.  Objective: Vitals:   10/15/20 2047 10/15/20 2300 10/16/20 0514 10/16/20 0900  BP: (!) 142/86 138/79 139/80 130/85  Pulse: 76 81 60 87  Resp: 18  17 16   Temp: 98.7 F (37.1 C) 98.5 F (36.9 C) 98.1 F (36.7 C) 99.5 F (37.5 C)  TempSrc: Oral Oral Oral Oral  SpO2: 98% 98% 96% 96%   No intake or output data in the 24 hours ending 10/16/20 1127 There were no vitals filed for this visit.  Examination:  GENERAL: Frail looking elderly female. HEENT: MMM.  Vision and hearing grossly intact.  NECK: Supple.  No apparent JVD.  RESP: On RA.  No IWOB.  Fair aeration bilaterally. CVS:  RRR. Heart sounds normal.  ABD/GI/GU: BS+. Abd soft, NTND.  Indwelling Foley MSK/EXT:  Moves extremities. No apparent deformity. No edema.  SKIN: no apparent skin lesion or wound NEURO: Sleepy but wakes to voice.  Oriented x0.  No apparent focal neuro deficit. PSYCH: Calm.  No distress or agitation.  Procedures:  None  Microbiology summarized: COVID-19 and  influenza PCR nonreactive. MRSA PCR negative.  Assessment & Plan: Fall at nursing home Displaced periprosthetic fracture of left femur Vitamin D insufficiency-vitamin D 16.75 -Plan for surgical repair by orthopedic surgery -Cardiopulmonary risk factor is mainly her age and the surgery itself. -Pain control with Tylenol and IV morphine -Discontinue IV Toradol in the setting of AKI. -On subcu heparin for DVT prophylaxis -Start p.o. vitamin D supplementation after surgery. -IV fluid while n.p.o.  AKI: Cr 0.64 on admit>> 1.11.  BUN 18 due to Toradol? -Discontinue Toradol -Gentle IV fluid -Recheck in the morning  Possible UTI: Based on UA which is positive for nitrite and many bacteria.  Patient cannot provide history.  She has no leukocytosis or fever. -Short course of ceftriaxone -Check urine culture  Normocytic anemia: Hgb 12.8 (admit)> 9.9 (baseline)..  Likely hemoconcentration from dehydration on arrival. -IV fluid as above -Continue monitoring  Lewy body dementia/Parkinson's disease: Seems stable.  She is not oriented even to her name on my exam. -Reorientation and delirium precautions -Continue home medications after surgery.  Controlled DM-2: A1c 6.7%.  Since she is on glipizide and Metformin at home. Recent Labs  Lab 10/16/20 0010 10/16/20 0643  GLUCAP 154* 164*  -Continue CBG monitoring and SSI -Liberate diet once she start eating.  Essential hypertension: Normotensive.  On low-dose lisinopril and Lasix at home. -Hold bolus in the setting of AKI.  Hypothyroidism -Continue home Synthroid  Goal of care: Elderly female with advanced dementia and hip fractures.  Currently full code which  I believe would pose more harm than benefit.  Admitting provider discussed this with Elouise MunroeNancy Green, POA who will discuss the matter with with the rest of family members. -Will involve palliative medicine to facilitate    There is no height or weight on file to calculate BMI.        Pressure Injury 06/28/17 Stage II -  Partial thickness loss of dermis presenting as a shallow open ulcer with a red, pink wound bed without slough. size of a pin head (Active)  06/28/17 2033  Location: Coccyx  Location Orientation: Medial  Staging: Stage II -  Partial thickness loss of dermis presenting as a shallow open ulcer with a red, pink wound bed without slough.  Wound Description (Comments): size of a pin head  Present on Admission: Yes   DVT prophylaxis:  heparin injection 5,000 Units Start: 10/15/20 2300  Code Status: Full code Family Communication: Patient and/or RN. Available if any question. Status is: Inpatient  Remains inpatient appropriate because:Ongoing diagnostic testing needed not appropriate for outpatient work up, Unsafe d/c plan, IV treatments appropriate due to intensity of illness or inability to take PO and Inpatient level of care appropriate due to severity of illness   Dispo: The patient is from: SNF              Anticipated d/c is to: SNF              Anticipated d/c date is: 3 days              Patient currently is not medically stable to d/c.       Consultants:  Orthopedic surgery   Sch Meds:  Scheduled Meds: . carbidopa-levodopa  1 tablet Oral 4 times per day  . chlorhexidine  60 mL Topical Once  . Chlorhexidine Gluconate Cloth  6 each Topical Daily  . heparin  5,000 Units Subcutaneous Q8H  . insulin aspart  0-15 Units Subcutaneous TID WC  . levothyroxine  50 mcg Oral Q0600  . mirtazapine  7.5 mg Oral QHS  . povidone-iodine  2 application Topical Once   Continuous Infusions: .  ceFAZolin (ANCEF) IV    . cefTRIAXone (ROCEPHIN)  IV    . lactated ringers 50 mL/hr at 10/16/20 0840   PRN Meds:.acetaminophen, alum & mag hydroxide-simeth, docusate sodium, magnesium hydroxide, morphine injection  Antimicrobials: Anti-infectives (From admission, onward)   Start     Dose/Rate Route Frequency Ordered Stop   10/16/20 1330  ceFAZolin (ANCEF)  IVPB 2g/100 mL premix        2 g 200 mL/hr over 30 Minutes Intravenous To ShortStay Surgical 10/15/20 2320 10/17/20 1330   10/16/20 1215  cefTRIAXone (ROCEPHIN) 1 g in sodium chloride 0.9 % 100 mL IVPB        1 g 200 mL/hr over 30 Minutes Intravenous Every 24 hours 10/16/20 1127 10/19/20 1214   10/16/20 1200  ceFAZolin (ANCEF) IVPB 2g/100 mL premix  Status:  Discontinued        2 g 200 mL/hr over 30 Minutes Intravenous To ShortStay Surgical 10/16/20 1104 10/16/20 1120       I have personally reviewed the following labs and images: CBC: Recent Labs  Lab 10/15/20 1158 10/16/20 0416  WBC 7.6 9.2  NEUTROABS 5.1  --   HGB 12.8 9.9*  HCT 37.9 30.5*  MCV 94.5 95.6  PLT 401* 315   BMP &GFR Recent Labs  Lab 10/15/20 1312 10/16/20 0416  NA 139 138  K 3.9 4.3  CL 102 101  CO2 25 24  GLUCOSE 191* 176*  BUN 15 18  CREATININE 0.64 1.11*  CALCIUM 9.1 9.0   Estimated Creatinine Clearance: 33.2 mL/min (A) (by C-G formula based on SCr of 1.11 mg/dL (H)). Liver & Pancreas: Recent Labs  Lab 10/15/20 1312  AST 23  ALT 15  ALKPHOS 79  BILITOT 0.5  PROT 6.8  ALBUMIN 3.5   No results for input(s): LIPASE, AMYLASE in the last 168 hours. No results for input(s): AMMONIA in the last 168 hours. Diabetic: Recent Labs    10/16/20 0416  HGBA1C 6.7*   Recent Labs  Lab 10/16/20 0010 10/16/20 0643  GLUCAP 154* 164*   Cardiac Enzymes: No results for input(s): CKTOTAL, CKMB, CKMBINDEX, TROPONINI in the last 168 hours. No results for input(s): PROBNP in the last 8760 hours. Coagulation Profile: No results for input(s): INR, PROTIME in the last 168 hours. Thyroid Function Tests: No results for input(s): TSH, T4TOTAL, FREET4, T3FREE, THYROIDAB in the last 72 hours. Lipid Profile: No results for input(s): CHOL, HDL, LDLCALC, TRIG, CHOLHDL, LDLDIRECT in the last 72 hours. Anemia Panel: No results for input(s): VITAMINB12, FOLATE, FERRITIN, TIBC, IRON, RETICCTPCT in the last 72  hours. Urine analysis:    Component Value Date/Time   COLORURINE YELLOW (A) 10/15/2020 1158   APPEARANCEUR HAZY (A) 10/15/2020 1158   APPEARANCEUR Cloudy 11/12/2014 0232   LABSPEC 1.018 10/15/2020 1158   LABSPEC 1.017 11/12/2014 0232   PHURINE 6.0 10/15/2020 1158   GLUCOSEU NEGATIVE 10/15/2020 1158   GLUCOSEU Negative 11/12/2014 0232   HGBUR NEGATIVE 10/15/2020 1158   BILIRUBINUR NEGATIVE 10/15/2020 1158   BILIRUBINUR Negative 11/12/2014 0232   KETONESUR 20 (A) 10/15/2020 1158   PROTEINUR NEGATIVE 10/15/2020 1158   NITRITE POSITIVE (A) 10/15/2020 1158   LEUKOCYTESUR NEGATIVE 10/15/2020 1158   LEUKOCYTESUR Negative 11/12/2014 0232   Sepsis Labs: Invalid input(s): PROCALCITONIN, LACTICIDVEN  Microbiology: Recent Results (from the past 240 hour(s))  Urine culture     Status: Abnormal (Preliminary result)   Collection Time: 10/15/20 11:58 AM   Specimen: Urine, Random  Result Value Ref Range Status   Specimen Description   Final    URINE, RANDOM Performed at [facility], [address], [place], [place] [address]    Special Requests   Final    NONE Performed at [facility], [address], [place], [place] [address]    Culture (A)  Final    >=100,000 COLONIES/mL Romie Minus NEGATIVE RODS SUSCEPTIBILITIES TO FOLLOW Performed at [facility], [address] [place], [place] [address]    Report Status PENDING  Incomplete  Resp Panel by RT-PCR (Flu A&B, Covid) Nasopharyngeal Swab     Status: None   Collection Time: 10/15/20 12:18 PM   Specimen: Nasopharyngeal Swab; Nasopharyngeal(NP) swabs in vial transport medium  Result Value Ref Range Status   SARS Coronavirus 2 by RT PCR NEGATIVE NEGATIVE Final    Comment: (NOTE) SARS-CoV-2 target nucleic acids are NOT DETECTED.  The SARS-CoV-2 RNA is generally detectable in upper respiratory specimens during the acute phase of infection. The lowest concentration of SARS-CoV-2 viral copies this assay can detect  is 138 copies/mL. A negative result does not preclude SARS-Cov-2 infection and should not be used as the sole basis for treatment or other patient management decisions. A negative result may occur with  improper specimen collection/handling, submission of specimen other than nasopharyngeal swab, presence of viral mutation(s) within the areas targeted by this assay, and inadequate number of  viral copies(<138 copies/mL). A negative result must be combined with clinical observations, patient history, and epidemiological information. The expected result is Negative.  Fact Sheet for Patients:  BloggerCourse.com  Fact Sheet for Healthcare Providers:  SeriousBroker.it  This test is no t yet approved or cleared by the [place] FDA and  has been authorized for detection and/or diagnosis of SARS-CoV-2 by FDA under an Emergency Use Authorization (EUA). This EUA will remain  in effect (meaning this test can be used) for the duration of the COVID-19 declaration under Section 564(b)(1) of the Act, 21 U.S.C.section 360bbb-3(b)(1), unless the authorization is terminated  or revoked sooner.       Influenza A by PCR NEGATIVE NEGATIVE Final   Influenza B by PCR NEGATIVE NEGATIVE Final    Comment: (NOTE) The Xpert Xpress SARS-CoV-2/FLU/RSV plus assay is intended as an aid in the diagnosis of influenza from Nasopharyngeal swab specimens and should not be used as a sole basis for treatment. Nasal washings and aspirates are unacceptable for Xpert Xpress SARS-CoV-2/FLU/RSV testing.  Fact Sheet for Patients: BloggerCourse.com  Fact Sheet for Healthcare Providers: SeriousBroker.it  This test is not yet approved or cleared by the [place] FDA and has been authorized for detection and/or diagnosis of SARS-CoV-2 by FDA under an Emergency Use Authorization (EUA). This EUA will remain in effect  (meaning this test can be used) for the duration of the COVID-19 declaration under Section 564(b)(1) of the Act, 21 U.S.C. section 360bbb-3(b)(1), unless the authorization is terminated or revoked.  Performed at [facility], [address], [place], [place] [address]   Surgical pcr screen     Status: None   Collection Time: 10/16/20 12:46 AM   Specimen: Nasal Mucosa; Nasal Swab  Result Value Ref Range Status   MRSA, PCR NEGATIVE NEGATIVE Final   Staphylococcus aureus NEGATIVE NEGATIVE Final    Comment: (NOTE) The Xpert SA Assay (FDA approved for NASAL specimens in patients 70 years of age and older), is one component of a comprehensive surveillance program. It is not intended to diagnose infection nor to guide or monitor treatment. Performed at [facility], [address], [place], [place] [address]     Radiology Studies: DG Pelvis 1-2 Views  Result Date: 10/15/2020 CLINICAL DATA:  Fall EXAM: RIGHT FEMUR 2 VIEWS; PELVIS - 1-2 VIEW COMPARISON:  05/11/2017 FINDINGS: Patient is status post right total hip arthroplasty. There is an acute periprosthetic spiral fracture of the subtrochanteric aspect of the proximal right femur. Fracture is medially displaced by up to 1 shaft width. Slight posterior displacement. Osseous structures are markedly demineralized. Partially visualized left hip ORIF hardware. Advanced degenerative changes of the right knee. Prominent vascular calcifications. IMPRESSION: 1. Prior right total hip arthroplasty with an acute, displaced periprosthetic fracture of the subtrochanteric aspect of the proximal right femur. 2. Osteopenia. Electronically Signed   By: Duanne Guess D.O.   On: 10/15/2020 12:51   CT Head Wo Contrast  Result Date: 10/15/2020 CLINICAL DATA:  Head trauma. EXAM: CT HEAD WITHOUT CONTRAST TECHNIQUE: Contiguous axial images were obtained from the base of the skull through the vertex without intravenous contrast. COMPARISON:  May 11, 2017 FINDINGS: Brain: No evidence of acute infarction, hemorrhage, hydrocephalus or mass lesion/mass effect. Mild left frontal hygroma. Vascular: Calcific atherosclerotic disease of the intra cavernous carotid arteries. Skull: Normal. Negative for fracture or focal lesion. Sinuses/Orbits: No acute finding. Other: None. IMPRESSION: 1. No acute intracranial abnormality. 2. Mild left frontal hygroma. Electronically Signed  By: Ted Mcalpine M.D.   On: 10/15/2020 13:16   CT Cervical Spine Wo Contrast  Result Date: 10/15/2020 CLINICAL DATA:  Status post fall. EXAM: CT CERVICAL SPINE WITHOUT CONTRAST TECHNIQUE: Multidetector CT imaging of the cervical spine was performed without intravenous contrast. Multiplanar CT image reconstructions were also generated. COMPARISON:  None. FINDINGS: Alignment: Normal. Skull base and vertebrae: No acute fracture. No primary bone lesion or focal pathologic process. Soft tissues and spinal canal: No prevertebral fluid or swelling. No visible canal hematoma. Disc levels:  Mild multilevel osteoarthritic changes. Upper chest: Negative. Other: None. IMPRESSION: 1. No evidence of acute traumatic injury to the cervical spine. 2. Mild multilevel osteoarthritic changes of the cervical spine. Electronically Signed   By: Ted Mcalpine M.D.   On: 10/15/2020 13:20   Chest Portable 1 View  Result Date: 10/16/2020 CLINICAL DATA:  Femur fracture. EXAM: PORTABLE CHEST 1 VIEW COMPARISON:  June 28, 2017. FINDINGS: Stable cardiomediastinal silhouette. Both lungs are clear. Old right rib fractures are noted. IMPRESSION: No active disease. Electronically Signed   By: Lupita Raider M.D.   On: 10/16/2020 08:11   DG Femur Min 2 Views Right  Result Date: 10/15/2020 CLINICAL DATA:  Fall EXAM: RIGHT FEMUR 2 VIEWS; PELVIS - 1-2 VIEW COMPARISON:  05/11/2017 FINDINGS: Patient is status post right total hip arthroplasty. There is an acute periprosthetic spiral fracture of the  subtrochanteric aspect of the proximal right femur. Fracture is medially displaced by up to 1 shaft width. Slight posterior displacement. Osseous structures are markedly demineralized. Partially visualized left hip ORIF hardware. Advanced degenerative changes of the right knee. Prominent vascular calcifications. IMPRESSION: 1. Prior right total hip arthroplasty with an acute, displaced periprosthetic fracture of the subtrochanteric aspect of the proximal right femur. 2. Osteopenia. Electronically Signed   By: Duanne Guess D.O.   On: 10/15/2020 12:51    Catharine Kettlewell T. Lyra Alaimo [facility]  If 7PM-7AM, please contact night-coverage [email or web address] 10/16/2020, 11:27 AM

## 2020-10-16 NOTE — Op Note (Signed)
07/31/2019 - 08/02/2019  5:54 PM  PATIENT:  Kara Moore  85 y.o. female  PRE-OPERATIVE DIAGNOSIS:  Right Periprosthetic Femur Fracture  POST-OPERATIVE DIAGNOSIS:  Right Periprosthetic Femur Fracture  PROCEDURE:  Procedure(s): OPEN REDUCTION INTERNAL FIXATION (ORIF) RIGHT FEMUR SHAFT FRACTURE (Right)  SURGEON:  Surgeon(s) and Role:    Myrene Galas, MD - Primary  PHYSICIAN ASSISTANT: 1. KEITH PAUL,PA-C, 2. PA Student  ANESTHESIA:   general  EBL:  100 mL   BLOOD ADMINISTERED:none  DRAINS: none   LOCAL MEDICATIONS USED:  NONE  SPECIMEN:  No Specimen  DISPOSITION OF SPECIMEN:  N/A  COUNTS:  YES  TOURNIQUET:  * No tourniquets in log *  DICTATION: .Note written in EPIC  PLAN OF CARE: Admit to inpatient   PATIENT DISPOSITION:  PACU - hemodynamically stable.   Delay start of Pharmacological VTE agent (>24hrs) due to surgical blood loss or risk of bleeding: no.  BRIEF SUMMARY OF INDICATION FOR PROCEDURE:  Kara Moore is a 85 y.o. with nonverbal dementia and multiple arm and leg contractures who sustained a comminuted spiral femur fracture around her femoral stem. The risks and benefits of surgery were discussed with the patient's neice and by extension her family, including the possibility of infection, nerve injury, vessel injury, blood transfusion, new fracture below the implant, wound breakdown, arthritis, symptomatic hardware, DVT/ PE, loss of motion, malunion, nonunion, heart attack, stroke, death, and need for further surgery among others.  These risks were acknowledged and consent provided to proceed.  BRIEF SUMMARY OF PROCEDURE:  The patient was taken to the operating room where general anesthesia was induced and after receipt of preoperative antibiotics.  The right lower extremity was prepped and draped in usual sterile fashion with a bump under the operative hip and torso.  No tourniquet was used during the procedure.  A radiolucent triangle was placed  underneath the femur and towel bumps to restore appropriate alignment and length. C-arm was brought in to confirm the appropriate position of the proximal incision.  This was checked on lateral as well.  An incision was then made through the old surgical scar proximally.  Dissection was carried down to the IT band and then the vastus.  It was split in line with the incision and the muscle belly retracted anteriorly while the perforating vessels were controlled and cauterizerd.  The deep protractor was placed.  Hematoma evacuated from the fracture sites.  My assistant pulled and maintained traction and dialed in the rotation for alignment, while I placed a clamp. We then used the Arthrex cerclage fiberwire tape to cerclage the shaft in a reduced position provisionally in two places.  I then introduced the Biomet NCB plate, contouring into slight valgus, placing a single screw and then checked the plate position on both AP and lateral views proximally and distally. I then placed an additional distal and proximal screws and sequentially tightened each until dialed in.  I added unicortical screws and then locking caps to all screws plus two more tensioned suture cerclage. All were confirmed for appropriate position on orthogonal views.  Wounds were irrigated thoroughly and then closed in standard layered fashion using #1 Vicryl for the tensor, 0 Vicryl for the deep subcu, 2-0 Vicryl and 3-0 vertical mattress sutures for the skin.  A sterile gently compressive dressing was then applied with an Ace wrap from foot to thigh as well. Patient will continue hip precautions but otherwise unrestricted range of motion. Montez Morita, PA-C, assisted me throughout and  an assistant was necessary to obtain and maintain reduction during provisional and definitive fixation and also assisted with wound closure.   PROGNOSIS:  Because of comorbidities, contractures, and impaired bone quality, patient is at increased risk for  perioperative complications. Patient is already bed to chair which can be continued tomorrow as pain allows. No role for bracing. Resume preop medications. She has been receiving subcu heparin while in the hospital. F/u in 14 days for removal of sutures.    Kara Moore. Kara Moore, M.D.

## 2020-10-16 NOTE — Anesthesia Procedure Notes (Signed)
Procedure Name: Intubation Date/Time: 10/16/2020 2:45 PM Performed by: Marena Chancy, CRNA Pre-anesthesia Checklist: Patient identified, Emergency Drugs available, Suction available and Patient being monitored Patient Re-evaluated:Patient Re-evaluated prior to induction Oxygen Delivery Method: Circle System Utilized Preoxygenation: Pre-oxygenation with 100% oxygen Induction Type: IV induction Ventilation: Mask ventilation without difficulty Laryngoscope Size: Miller and 2 Grade View: Grade I Tube type: Oral Tube size: 6.5 mm Number of attempts: 1 Airway Equipment and Method: Stylet Placement Confirmation: ETT inserted through vocal cords under direct vision,  positive ETCO2 and breath sounds checked- equal and bilateral Secured at: 22 cm Tube secured with: Tape Dental Injury: Teeth and Oropharynx as per pre-operative assessment  Comments: Performed by Gennette Pac

## 2020-10-17 ENCOUNTER — Inpatient Hospital Stay (HOSPITAL_COMMUNITY): Payer: Medicare Other

## 2020-10-17 DIAGNOSIS — F0391 Unspecified dementia with behavioral disturbance: Secondary | ICD-10-CM

## 2020-10-17 DIAGNOSIS — G9341 Metabolic encephalopathy: Secondary | ICD-10-CM

## 2020-10-17 DIAGNOSIS — R531 Weakness: Secondary | ICD-10-CM

## 2020-10-17 DIAGNOSIS — Z515 Encounter for palliative care: Secondary | ICD-10-CM

## 2020-10-17 LAB — COMPREHENSIVE METABOLIC PANEL
ALT: <5 U/L (ref 0–44)
AST: 26 U/L (ref 15–41)
Albumin: 2.7 g/dL — ABNORMAL LOW (ref 3.5–5.0)
Alkaline Phosphatase: 52 U/L (ref 38–126)
Anion gap: 12 (ref 5–15)
BUN: 19 mg/dL (ref 8–23)
CO2: 24 mmol/L (ref 22–32)
Calcium: 8.3 mg/dL — ABNORMAL LOW (ref 8.9–10.3)
Chloride: 102 mmol/L (ref 98–111)
Creatinine, Ser: 0.99 mg/dL (ref 0.44–1.00)
GFR, Estimated: 57 mL/min — ABNORMAL LOW (ref >60)
Glucose, Bld: 162 mg/dL — ABNORMAL HIGH (ref 70–99)
Potassium: 4.2 mmol/L (ref 3.5–5.1)
Sodium: 138 mmol/L (ref 135–145)
Total Bilirubin: 0.7 mg/dL (ref 0.3–1.2)
Total Protein: 5 g/dL — ABNORMAL LOW (ref 6.5–8.1)

## 2020-10-17 LAB — URINE CULTURE
Culture: >=100000 — AB
Culture: NO GROWTH

## 2020-10-17 LAB — GLUCOSE, CAPILLARY
Glucose-Capillary: 149 mg/dL — ABNORMAL HIGH (ref 70–99)
Glucose-Capillary: 146 mg/dL — ABNORMAL HIGH (ref 70–99)
Glucose-Capillary: 161 mg/dL — ABNORMAL HIGH (ref 70–99)
Glucose-Capillary: 77 mg/dL (ref 70–99)
Glucose-Capillary: 134 mg/dL — ABNORMAL HIGH (ref 70–99)

## 2020-10-17 LAB — CBC
HCT: 23 % — ABNORMAL LOW (ref 36.0–46.0)
HCT: 22.9 % — ABNORMAL LOW (ref 36.0–46.0)
Hemoglobin: 7.4 g/dL — ABNORMAL LOW (ref 12.0–15.0)
Hemoglobin: 7.2 g/dL — ABNORMAL LOW (ref 12.0–15.0)
MCH: 31.4 pg (ref 26.0–34.0)
MCH: 30.8 pg (ref 26.0–34.0)
MCHC: 32.2 g/dL (ref 30.0–36.0)
MCHC: 31.4 g/dL (ref 30.0–36.0)
MCV: 97.5 fL (ref 80.0–100.0)
MCV: 97.9 fL (ref 80.0–100.0)
Platelets: 237 10*3/uL (ref 150–400)
Platelets: 232 10*3/uL (ref 150–400)
RBC: 2.36 MIL/uL — ABNORMAL LOW (ref 3.87–5.11)
RBC: 2.34 MIL/uL — ABNORMAL LOW (ref 3.87–5.11)
RDW: 14.6 % (ref 11.5–15.5)
RDW: 14.6 % (ref 11.5–15.5)
WBC: 8.6 10*3/uL (ref 4.0–10.5)
WBC: 11.5 10*3/uL — ABNORMAL HIGH (ref 4.0–10.5)
nRBC: 0 % (ref 0.0–0.2)
nRBC: 0 % (ref 0.0–0.2)

## 2020-10-17 LAB — PREALBUMIN: Prealbumin: 11.6 mg/dL — ABNORMAL LOW (ref 18–38)

## 2020-10-17 LAB — MAGNESIUM: Magnesium: 1.5 mg/dL — ABNORMAL LOW (ref 1.7–2.4)

## 2020-10-17 LAB — PHOSPHORUS: Phosphorus: 3.4 mg/dL (ref 2.5–4.6)

## 2020-10-17 MED ORDER — ACETAMINOPHEN 650 MG RE SUPP
650.0000 mg | Freq: Four times a day (QID) | RECTAL | Status: DC | PRN
  Administered 2020-10-17 – 2020-10-18 (×2): 650 mg via RECTAL
  Filled 2020-10-17 (×2): qty 1

## 2020-10-17 MED ORDER — MORPHINE SULFATE (PF) 2 MG/ML IV SOLN: 1.0000 mg | INTRAVENOUS | Status: DC | PRN

## 2020-10-17 MED ORDER — ACETAMINOPHEN 325 MG PO TABS
650.0000 mg | ORAL_TABLET | ORAL | Status: DC | PRN
  Filled 2020-10-17: qty 2

## 2020-10-17 MED ORDER — ONDANSETRON HCL 4 MG/2ML IJ SOLN: 4.0000 mg | Freq: Three times a day (TID) | INTRAMUSCULAR | Status: DC | PRN

## 2020-10-17 MED ORDER — MAGNESIUM SULFATE 2 GM/50ML IV SOLN
2.0000 g | Freq: Once | INTRAVENOUS | Status: AC
  Administered 2020-10-17: 2 g via INTRAVENOUS
  Filled 2020-10-17: qty 50

## 2020-10-17 NOTE — Progress Notes (Signed)
Orthopaedic Trauma Service Progress Note  Patient ID: GENNY CAULDER MRN: [id number] DOB/AGE: [birth year] 83 y.o.  Subjective:  No acute ortho issues   ROS Nonverbal   Objective:   VITALS:   Vitals:   10/17/20 0017 10/17/20 0300 10/17/20 0715 10/17/20 0836  BP: (!) 104/59 (!) 102/57  (!) 112/57  Pulse: 91 81  85  Resp: 14 16 16 16   Temp: 99.5 F (37.5 C) 100.2 F (37.9 C)  (!) 101.1 F (38.4 C)  TempSrc: Oral Oral    SpO2: 99% 95% 94% 98%    Estimated body mass index is 21.38 kg/m as calculated from the following:   Height as of an earlier encounter on 10/15/20: 5\' 4"  (1.626 m).   Weight as of an earlier encounter on 10/15/20: 56.5 kg.   Intake/Output      11/29 0701 - 11/30 0700 11/30 0701 - 12/01 0700   I.V. 1849.6    IV Piggyback 550    Total Intake 2399.6    Urine 1125    Blood 100    Total Output 1225    Net +1174.6           LABS  Results for orders placed or performed during the hospital encounter of 10/15/20 (from the past 24 hour(s))  Glucose, capillary     Status: None   Collection Time: 10/16/20 12:01 PM  Result Value Ref Range   Glucose-Capillary 79 70 - 99 mg/dL  Glucose, capillary     Status: Abnormal   Collection Time: 10/16/20  3:09 PM  Result Value Ref Range   Glucose-Capillary 120 (H) 70 - 99 mg/dL  Glucose, capillary     Status: Abnormal   Collection Time: 10/16/20  6:18 PM  Result Value Ref Range   Glucose-Capillary 169 (H) 70 - 99 mg/dL  Glucose, capillary     Status: Abnormal   Collection Time: 10/16/20  9:17 PM  Result Value Ref Range   Glucose-Capillary 194 (H) 70 - 99 mg/dL  CBC     Status: Abnormal   Collection Time: 10/17/20  5:01 AM  Result Value Ref Range   WBC 8.6 4.0 - 10.5 K/uL   RBC 2.36 (L) 3.87 - 5.11 MIL/uL   Hemoglobin 7.4 (L) 12.0 - 15.0 g/dL   HCT 10/18/20 (L) 36 - 46 %   MCV 97.5 80.0 - 100.0 fL   MCH 31.4 26.0 - 34.0 pg   MCHC 32.2  30.0 - 36.0 g/dL   RDW 10/19/20 33.5 - 45.6 %   Platelets 237 150 - 400 K/uL   nRBC 0.0 0.0 - 0.2 %  Comprehensive metabolic panel     Status: Abnormal   Collection Time: 10/17/20  5:01 AM  Result Value Ref Range   Sodium 138 135 - 145 mmol/L   Potassium 4.2 3.5 - 5.1 mmol/L   Chloride 102 98 - 111 mmol/L   CO2 24 22 - 32 mmol/L   Glucose, Bld 162 (H) 70 - 99 mg/dL   BUN 19 8 - 23 mg/dL   Creatinine, Ser 38.9 0.44 - 1.00 mg/dL   Calcium 8.3 (L) 8.9 - 10.3 mg/dL   Total Protein 5.0 (L) 6.5 - 8.1 g/dL   Albumin 2.7 (L) 3.5 - 5.0 g/dL   AST 26 15 - 41 U/L   ALT <  5 0 - 44 U/L   Alkaline Phosphatase 52 38 - 126 U/L   Total Bilirubin 0.7 0.3 - 1.2 mg/dL   GFR, Estimated 57 (L) >60 mL/min   Anion gap 12 5 - 15  Prealbumin     Status: Abnormal   Collection Time: 10/17/20  5:01 AM  Result Value Ref Range   Prealbumin 11.6 (L) 18 - 38 mg/dL  Magnesium     Status: Abnormal   Collection Time: 10/17/20  5:01 AM  Result Value Ref Range   Magnesium 1.5 (L) 1.7 - 2.4 mg/dL  Phosphorus     Status: None   Collection Time: 10/17/20  5:01 AM  Result Value Ref Range   Phosphorus 3.4 2.5 - 4.6 mg/dL  Glucose, capillary     Status: Abnormal   Collection Time: 10/17/20  6:49 AM  Result Value Ref Range   Glucose-Capillary 149 (H) 70 - 99 mg/dL  Glucose, capillary     Status: Abnormal   Collection Time: 10/17/20  8:27 AM  Result Value Ref Range   Glucose-Capillary 146 (H) 70 - 99 mg/dL     PHYSICAL EXAM:   Gen: in bed, NAD, nonverbal  Ext:       Right Lower Extremity   Dressing c/d/i   Ext warm   Minimal swelling  No expanding hematoma  Not participatory in exam   + DP pulse    Assessment/Plan: 1 Day Post-Op   Active Problems:   Memory loss   Hypothyroidism   Parkinson's disease (tremor, stiffness, slow motion, unstable posture) (HCC)   Diabetes mellitus type 2, uncomplicated (HCC)   Femur fracture, right (HCC)   Anti-infectives (From admission, onward)   Start     Dose/Rate  Route Frequency Ordered Stop   10/16/20 2200  ceFAZolin (ANCEF) IVPB 2g/100 mL premix        2 g 200 mL/hr over 30 Minutes Intravenous Every 8 hours 10/16/20 2000 10/17/20 2159   10/16/20 1330  ceFAZolin (ANCEF) IVPB 2g/100 mL premix        2 g 200 mL/hr over 30 Minutes Intravenous To ShortStay Surgical 10/15/20 2320 10/16/20 1431   10/16/20 1215  cefTRIAXone (ROCEPHIN) 1 g in sodium chloride 0.9 % 100 mL IVPB        1 g 200 mL/hr over 30 Minutes Intravenous Every 24 hours 10/16/20 1127 10/19/20 1214   10/16/20 1200  ceFAZolin (ANCEF) IVPB 2g/100 mL premix  Status:  Discontinued        2 g 200 mL/hr over 30 Minutes Intravenous To ShortStay Surgical 10/16/20 1104 10/16/20 1120    .  POD/HD#: 1  85 y/o female s/p fall, nonambulatory, nonverbal   - R periprosthetic proximal femur fracture s/p ORIF  NWB R leg  PT/OT  Knee and hips are very stiff at baseline   Knee does not come into full extension which is expected since she sits most of the time   Dressing change tomorrow    - Pain management:  Tylenol   Minimize narcotics   - ABL anemia/Hemodynamics  Cbc in am   May need transfusion if family ok with that   - Medical issues   Per primary   - DVT/PE prophylaxis:  SQ heparin while in hospital  - ID:   periop abx   - Metabolic Bone Disease:  Vitamin d deficiency    Supplement for now   - Dispo:  Ortho issues stable   Return to facility once cleared by medicine and  once GOC established with palliative medicine team     Mearl Latin, PA-C [phone] (C) 10/17/2020, 9:32 AM  [facility] [address] [place] [address] [phone] Val [phone] (F)    After 5pm and on the weekends please log on to Amion, go to orthopaedics and the look under the [facility] Call for the provider(s) on call. You can also call our office at [phone] and then follow the prompts to be connected to the call team.

## 2020-10-17 NOTE — Progress Notes (Signed)
   10/17/20 0836  Assess: MEWS Score  Temp (!) 101.1 F (38.4 C)  BP (!) 112/57  Pulse Rate 85  Resp 16  Level of Consciousness Responds to Voice  SpO2 98 %  O2 Device Room Air  Assess: MEWS Score  MEWS Temp 1  MEWS Systolic 0  MEWS Pulse 0  MEWS RR 0  MEWS LOC 1  MEWS Score 2  MEWS Score Color Yellow  Assess: if the MEWS score is Yellow or Red  Were vital signs taken at a resting state? No  Focused Assessment No change from prior assessment  Early Detection of Sepsis Score *See Row Information* Low  MEWS guidelines implemented *See Row Information* Yes  Treat  MEWS Interventions Administered scheduled meds/treatments;Administered prn meds/treatments  Take Vital Signs  Increase Vital Sign Frequency  Yellow: Q 2hr X 2 then Q 4hr X 2, if remains yellow, continue Q 4hrs  Escalate  MEWS: Escalate Yellow: discuss with charge nurse/RN and consider discussing with provider and RRT  Notify: Charge Nurse/RN  Name of Charge Nurse/RN Notified Marisa, RN  Date Charge Nurse/RN Notified 10/17/20  Time Charge Nurse/RN Notified 0850  Notify: Provider  Provider Name/Title Dr Alanda Slim  Date Provider Notified 10/17/20  Time Provider Notified [phone]  Notification Type Page  Notification Reason Other (Comment) (fever, yellow mews)  Response Other (Comment) (will see pt )  Date of Provider Response 10/17/20  Time of Provider Response [phone]  Document  Patient Outcome Stabilized after interventions  Progress note created (see row info) Yes

## 2020-10-17 NOTE — Evaluation (Signed)
Clinical/Bedside Swallow Evaluation Patient Details  Name: Kara Moore MRN: [id number] Date of Birth: Jan 14, 1935  Today's Date: 10/17/2020 Time: SLP Start Time (ACUTE ONLY): 1055 SLP Stop Time (ACUTE ONLY): 1120 SLP Time Calculation (min) (ACUTE ONLY): 25 min  Past Medical History:  Past Medical History:  Diagnosis Date  . Benign essential hypertension   . Confusion   . Diabetes mellitus without complication (HCC)   . Hyperlipidemia LDL goal < 100   . Hypothyroidism   . Macular degeneration   . Memory loss   . Senile osteoporosis    Past Surgical History:  Past Surgical History:  Procedure Laterality Date  . ADENOIDECTOMY  2011  . HIP ARTHROPLASTY Right 05/11/2017   Procedure: ARTHROPLASTY BIPOLAR HIP (HEMIARTHROPLASTY);  Surgeon: Lyndle Herrlich, MD;  Location: [facility] ORS;  Service: Orthopedics;  Laterality: Right;  . INTRAMEDULLARY (IM) NAIL INTERTROCHANTERIC Left 06/29/2017   Procedure: INTRAMEDULLARY (IM) NAIL INTERTROCHANTRIC;  Surgeon: Christena Flake, MD;  Location: [facility] ORS;  Service: Orthopedics;  Laterality: Left;   HPI:  85 year old female, resident of [facility], with history of Lewy body dementia, DM-2, HTN, hypothyroidism, HLD and prior right hemiarthroplasty and left intramedullary nailing brought to ED by EMS due to altered mental status and found to have displaced periprosthetic fracture of right femur. Right ORIF 11/29.    Assessment / Plan / Recommendation Clinical Impression  Pt participated in limited swallow evaluation.  She was non-verbal, maintained her eyes closed, did not follow commands.  She was alert, resisted effort to reposition hands in an effort to engage her in self-feeding.  Demonstrated no awareness of ice or edge of cup being placed to lips.  Small sips of water offered were held in oral cavity.  She occasionally was observed to produce a spontaneous swallow response.  Oral suctioning withdrew most of liquid bolus from lateral sulci.  Chart  review/ghost chart revealed limited information about baseline function, other than a record of a regular diet dating from July of 2020.  I phoned her POA, niece Elouise Munroe, but there was no answer.  For now, maintain NPO.  Please provide QD oral care (oral suctioning set up) and offer small sips of water to help maintain health of oral mucosa.  Hopefully as mental status approaches baseline so will her swallowing function.  SLP will follow. D/W RN.  SLP Visit Diagnosis: Dysphagia, unspecified (R13.10)    Aspiration Risk    tba   Diet Recommendation   NPO except sips of water       Other  Recommendations Oral Care Recommendations: Oral care prior to ice chip/H20   Follow up Recommendations Skilled Nursing facility      Frequency and Duration min 2x/week  2 weeks       Prognosis Prognosis for Safe Diet Advancement: Good      Swallow Study   General HPI: 85 year old female, resident of [facility], with history of Lewy body dementia, DM-2, HTN, hypothyroidism, HLD and prior right hemiarthroplasty and left intramedullary nailing brought to ED by EMS due to altered mental status and found to have displaced periprosthetic fracture of right femur. Right ORIF 11/29.  Type of Study: Bedside Swallow Evaluation Previous Swallow Assessment: no Diet Prior to this Study: NPO Temperature Spikes Noted: Yes Respiratory Status: Room air Behavior/Cognition: Doesn't follow directions Oral Cavity Assessment: Within Functional Limits Oral Care Completed by SLP: Yes Oral Cavity - Dentition: Adequate natural dentition Self-Feeding Abilities: Total assist Patient Positioning: Upright in bed Baseline Vocal  Quality: Not observed Volitional Cough: Cognitively unable to elicit Volitional Swallow: Unable to elicit    Oral/Motor/Sensory Function Overall Oral Motor/Sensory Function:  (symmetric at baseline)   Ice Chips Ice chips: Impaired Presentation: Spoon Oral Phase Impairments: Poor awareness of  bolus   Thin Liquid Thin Liquid: Impaired Presentation: Spoon Oral Phase Impairments: Reduced labial seal;Poor awareness of bolus Oral Phase Functional Implications: Oral holding Pharyngeal  Phase Impairments: Unable to trigger swallow    Nectar Thick Nectar Thick Liquid: Not tested   Honey Thick Honey Thick Liquid: Not tested   Puree Puree: Not tested   Solid     Solid: Not tested      Blenda Mounts Laurice 10/17/2020,11:35 AM  Marchelle Folks L. Samson Frederic, MA CCC/SLP Acute Rehabilitation Services Office number [phone] Pager [phone]

## 2020-10-17 NOTE — Progress Notes (Signed)
Nutrition Follow-up  DOCUMENTATION CODES:   Not applicable  INTERVENTION:   -D/c Ensure Enlive po BID, each supplement provides 350 kcal and 20 grams of protein -RD will follow for diet advancement and add supplements as appropriate -If pt remains unable to take PO's, may need to consider nutrition support (ex cortrak tube) if this is within pt's goals of care  NUTRITION DIAGNOSIS:   Increased nutrient needs related to post-op healing as evidenced by estimated needs.  Ongoing  GOAL:   Patient will meet greater than or equal to 90% of their needs  Unmet  MONITOR:   PO intake, Supplement acceptance, Diet advancement, Labs, Weight trends, Skin, I & O's  REASON FOR ASSESSMENT:   Consult, Malnutrition Screening Tool Assessment of nutrition requirement/status, Hip fracture protocol  ASSESSMENT:   Kara Moore is a 85 y.o. female with medical history significant of  hypertension, hyperlipidemia, diabetes, and Lewy body dementia who presents to the ED for altered mental status  11/29- s/p Procedure(s): OPEN REDUCTION INTERNAL FIXATION (ORIF) RIGHT FEMUR SHAFT FRACTURE (Right) 11/30- s/p BSE- recommend NPO  Reviewed I/O's: +1.2 L x 24 hours  UOP: 1.1 L x 24 hours  Pt remains with AMS. SLP has recommended NPO status, but hopeful that swallow function will improve as her mental status improves.   If remains unable to take PO"s, may need consider short term nutrition support.   Albumin has a half-life of 21 days and is strongly affected by stress response and inflammatory process, therefore, do not expect to see an improvement in this lab value during acute hospitalization. When a patient presents with low albumin, it is likely skewed due to the acute inflammatory response.  Unless it is suspected that patient had poor PO intake or malnutrition prior to admission, then RD should not be consulted solely for low albumin. Note that low albumin is no longer used to diagnose  malnutrition; [facility] uses the new malnutrition guidelines published by the American Society for Parenteral and Enteral Nutrition (A.S.P.E.N.) and the Academy of Nutrition and Dietetics (AND).    Medications reviewed and include sinemet, vitamin D3, remeron, vitamin D, and lactated ringers infusion @ 50 ml/hr.   Labs reviewed: Mg: 1.5, CBGS: 146-161 (inpatient orders for glycemic control are0-15 units insulin aspart TID with meals).   Diet Order:   Diet Order            Diet NPO time specified  Diet effective now                 EDUCATION NEEDS:   No education needs have been identified at this time  Skin:  Skin Assessment: Skin Integrity Issues: Skin Integrity Issues:: Incisions Incisions: rt thigh  Last BM:  Unknown  Height:   Ht Readings from Last 1 Encounters:  10/15/20 5\' 4"  (1.626 m)    Weight:   Wt Readings from Last 1 Encounters:  10/15/20 56.5 kg    Ideal Body Weight:  54.5 kg  BMI:  There is no height or weight on file to calculate BMI.  Estimated Nutritional Needs:   Kcal:  1500-1700  Protein:  70-85 grams  Fluid:  > 1.5 L    10/17/20, RD, LDN, CDCES Registered Dietitian II Certified Diabetes Care and Education Specialist Please refer to [facility] for RD and/or RD on-call/weekend/after hours pager

## 2020-10-17 NOTE — Progress Notes (Signed)
PROGRESS NOTE  Kara Moore KDX:[id number] DOB: 03-21-1935   PCP: Karie Schwalbe, MD  Patient is from: [facility].  Transfer from [facility].  DOA: 10/15/2020 LOS: 2  Chief complaints: Right femoral periprosthetic fracture  Brief Narrative / Interim history: 85 year old female with history of Lewy body dementia, DM-2, HTN, hypothyroidism, HLD and prior right hemiarthroplasty and left intramedullary nailing brought to ED by EMS due to altered mental status and found to have displaced periprosthetic fracture of right femur.  Apparently, she she had "minor fall" the day prior to presentation.  Also concerned about possible UTI.  Patient transferred to [facility] per orthopedic recommendation and had ORIF on 10/16/2020.  Subjective: Seen and examined earlier this morning.  She spiked fever to 101.9 this morning.  She also had an episode of small emesis.  She is very somnolent only moans to noxious stimuli.  Does not appear to be in respiratory distress.  Resisted eye exam.   Objective: Vitals:   10/17/20 0920 10/17/20 0930 10/17/20 1030 10/17/20 1230  BP: (!) 113/53 127/65 123/72 114/62  Pulse: 80 82 84 82  Resp: 16 18 16 16   Temp:  (!) 101.9 F (38.8 C) 98.7 F (37.1 C) (!) 97.5 F (36.4 C)  TempSrc:  Rectal Oral Oral  SpO2: 96% 96% 100% 99%    Intake/Output Summary (Last 24 hours) at 10/17/2020 1540 Last data filed at 10/17/2020 1500 Gross per 24 hour  Intake 2771.81 ml  Output 1225 ml  Net 1546.81 ml   There were no vitals filed for this visit.  Examination:  GENERAL: Frail looking elderly female. HEENT: MMM.  Vision and hearing grossly intact.  NECK: Supple.  No apparent JVD.  RESP: On RA.  No IWOB.  Fair aeration bilaterally. CVS:  RRR. Heart sounds normal.  ABD/GI/GU: BS+. Abd soft, NTND.  Indwelling Foley MSK/EXT:  Moves extremities. No apparent deformity. No edema.  SKIN: no apparent skin lesion or wound NEURO: Sleepy but wakes to voice.  Oriented x0.  No apparent  focal neuro deficit. PSYCH: Calm.  No distress or agitation.  GENERAL: Frail looking elderly female. HEENT: MMM.  Vision and hearing grossly intact.  NECK: Supple.  No apparent JVD.  RESP: On RA.  No IWOB.  Fair aeration bilaterally. CVS:  RRR. Heart sounds normal.  ABD/GI/GU: BS+. Abd soft, NTND.  MSK/EXT: Slight swelling in RLE.  No hematoma or ecchymosis. SKIN: no apparent skin lesion or wound NEURO: Somnolent.  Moans to noxious stimuli.  No apparent focal neuro deficit but limited due to mental status. PSYCH: Somnolent.  Does not appear to be in distress.  Procedures:  11/29-ORIF of right femoral shaft fracture  Microbiology summarized: COVID-19 and influenza PCR nonreactive. MRSA PCR negative.  Assessment & Plan: Fall at nursing home Displaced periprosthetic fracture of left femur Vitamin D insufficiency-vitamin D 16.75 -Underwent ORIF on 11/29 -Pain control with Tylenol and IV morphine-reduced dose of morphine due to encephalopathy -On subcu heparin for DVT prophylaxis -Started on p.o. vitamin D 50,000 units weekly -N.p.o. pending SLP evaluation due to mental status  Acute encephalopathy: Delirium?  Also spiking fever but no clear source of infection other than possible UTI for which she is on antibiotic.  CXR and KUB reassuring.  Received IV morphine 2 mg once last night which might have contributed. -Reduce IV morphine to 1 mg every 4 hours as needed severe pain -Reorientation and delirium precautions. -N.p.o. pending SLP eval and clearance -Continue gentle IVF  AKI: Cr 0.64 on  admit>> 1.11>0.99.  -Discontinued Toradol -Gentle IV fluid -Recheck in the morning  Possible UTI: Based on UA which is positive for nitrite and many bacteria.  Patient cannot provide history.  Now she has fever and mild leukocytosis.  Urine culture negative. -Continue ceftriaxone for 3 days-started on 11/29.  Normocytic anemia: Hgb 12.8 (admit)> 9.9 (baseline)>> 7.4> 7.7.  Likely a  combination of hemodilution and surgical blood loss.  No surgical site hematoma or ecchymosis on exam. -Continue monitoring -Check anemia panel -Transfuse for hemoglobin less than 7.0  Lewy body dementia/Parkinson's disease: Seems stable.  She is not oriented even to her name on my exam. -Reorientation and delirium precautions -Continue home medications after surgery.  Controlled DM-2: A1c 6.7%.  Since she is on glipizide and Metformin at home. Recent Labs  Lab 10/16/20 1818 10/16/20 2117 10/17/20 0649 10/17/20 0827 10/17/20 1228  GLUCAP 169* 194* 149* 146* 161*  -Continue CBG monitoring and SSI -Liberate diet once she start eating.  Essential hypertension: Normotensive.  On low-dose lisinopril and Lasix at home. -Hold bolus in the setting of AKI.  Hypothyroidism -Continue home Synthroid  Goal of care: Elderly female with advanced dementia and hip fractures.  Currently full code which I believe would pose more harm than benefit.  Admitting provider discussed this with Elouise Munroe, POA who will discuss the matter with with the rest of family members. -Will involve palliative medicine to facilitate    There is no height or weight on file to calculate BMI. Nutrition Problem: Increased nutrient needs Etiology: post-op healing Signs/Symptoms: estimated needs Interventions: Ensure Enlive (each supplement provides 350kcal and 20 grams of protein), MVI Pressure Injury 06/28/17 Stage II -  Partial thickness loss of dermis presenting as a shallow open ulcer with a red, pink wound bed without slough. size of a pin head (Active)  06/28/17 2033  Location: Coccyx  Location Orientation: Medial  Staging: Stage II -  Partial thickness loss of dermis presenting as a shallow open ulcer with a red, pink wound bed without slough.  Wound Description (Comments): size of a pin head  Present on Admission: Yes   DVT prophylaxis:  heparin injection 5,000 Units Start: 10/15/20 2300  Code Status:  Full code Family Communication: Patient and/or RN. Available if any question. Status is: Inpatient  Remains inpatient appropriate because:Altered mental status, Ongoing diagnostic testing needed not appropriate for outpatient work up, Unsafe d/c plan, IV treatments appropriate due to intensity of illness or inability to take PO and Inpatient level of care appropriate due to severity of illness   Dispo: The patient is from: SNF              Anticipated d/c is to: SNF              Anticipated d/c date is: 3 days              Patient currently is not medically stable to d/c.       Consultants:  Orthopedic surgery   Sch Meds:  Scheduled Meds: . carbidopa-levodopa  1 tablet Oral 4 times per day  . Chlorhexidine Gluconate Cloth  6 each Topical Daily  . cholecalciferol  2,000 Units Oral BID  . heparin  5,000 Units Subcutaneous Q8H  . insulin aspart  0-15 Units Subcutaneous TID WC  . levothyroxine  50 mcg Oral Q0600  . mirtazapine  7.5 mg Oral QHS  . multivitamin with minerals  1 tablet Oral Daily  . Vitamin D (Ergocalciferol)  50,000 Units Oral  Q7 days   Continuous Infusions: . cefTRIAXone (ROCEPHIN)  IV 1 g (10/17/20 1300)  . lactated ringers 50 mL/hr at 10/17/20 1011   PRN Meds:.acetaminophen **OR** acetaminophen, alum & mag hydroxide-simeth, docusate sodium, magnesium hydroxide, morphine injection, ondansetron (ZOFRAN) IV  Antimicrobials: Anti-infectives (From admission, onward)   Start     Dose/Rate Route Frequency Ordered Stop   10/16/20 2200  ceFAZolin (ANCEF) IVPB 2g/100 mL premix  Status:  Discontinued        2 g 200 mL/hr over 30 Minutes Intravenous Every 8 hours 10/16/20 2000 10/17/20 1005   10/16/20 1330  ceFAZolin (ANCEF) IVPB 2g/100 mL premix        2 g 200 mL/hr over 30 Minutes Intravenous To [facility] 10/15/20 2320 10/16/20 1431   10/16/20 1215  cefTRIAXone (ROCEPHIN) 1 g in sodium chloride 0.9 % 100 mL IVPB        1 g 200 mL/hr over 30 Minutes  Intravenous Every 24 hours 10/16/20 1127 10/19/20 1214   10/16/20 1200  ceFAZolin (ANCEF) IVPB 2g/100 mL premix  Status:  Discontinued        2 g 200 mL/hr over 30 Minutes Intravenous To [facility] 10/16/20 1104 10/16/20 1120       I have personally reviewed the following labs and images: CBC: Recent Labs  Lab 10/15/20 1158 10/16/20 0416 10/17/20 0501 10/17/20 0922  WBC 7.6 9.2 8.6 11.5*  NEUTROABS 5.1  --   --   --   HGB 12.8 9.9* 7.4* 7.2*  HCT 37.9 30.5* 23.0* 22.9*  MCV 94.5 95.6 97.5 97.9  PLT 401* 315 237 232   BMP &GFR Recent Labs  Lab 10/15/20 1312 10/16/20 0416 10/17/20 0501  NA 139 138 138  K 3.9 4.3 4.2  CL 102 101 102  CO2 GLUCOSE 191* 176* 162*  BUN CREATININE 0.64 1.11* 0.99  CALCIUM 9.1 9.0 8.3*  MG  --   --  1.5*  PHOS  --   --  3.4   Estimated Creatinine Clearance: 37.2 mL/min (by C-G formula based on SCr of 0.99 mg/dL). Liver & Pancreas: Recent Labs  Lab 10/15/20 1312 10/17/20 0501  AST 23 26  ALT 15 <5  ALKPHOS 79 52  BILITOT 0.5 0.7  PROT 6.8 5.0*  ALBUMIN 3.5 2.7*   No results for input(s): LIPASE, AMYLASE in the last 168 hours. No results for input(s): AMMONIA in the last 168 hours. Diabetic: Recent Labs    10/16/20 0416  HGBA1C 6.7*   Recent Labs  Lab 10/16/20 1818 10/16/20 2117 10/17/20 0649 10/17/20 0827 10/17/20 1228  GLUCAP 169* 194* 149* 146* 161*   Cardiac Enzymes: No results for input(s): CKTOTAL, CKMB, CKMBINDEX, TROPONINI in the last 168 hours. No results for input(s): PROBNP in the last 8760 hours. Coagulation Profile: No results for input(s): INR, PROTIME in the last 168 hours. Thyroid Function Tests: No results for input(s): TSH, T4TOTAL, FREET4, T3FREE, THYROIDAB in the last 72 hours. Lipid Profile: No results for input(s): CHOL, HDL, LDLCALC, TRIG, CHOLHDL, LDLDIRECT in the last 72 hours. Anemia Panel: No results for input(s): VITAMINB12, FOLATE, FERRITIN, TIBC, IRON,  RETICCTPCT in the last 72 hours. Urine analysis:    Component Value Date/Time   COLORURINE YELLOW (A) 10/15/2020 1158   APPEARANCEUR HAZY (A) 10/15/2020 1158   APPEARANCEUR Cloudy 11/12/2014 0232   LABSPEC 1.018 10/15/2020 1158   LABSPEC 1.017 11/12/2014 0232   PHURINE 6.0 10/15/2020 1158   GLUCOSEU NEGATIVE  10/15/2020 1158   GLUCOSEU Negative 11/12/2014 0232   HGBUR NEGATIVE 10/15/2020 1158   BILIRUBINUR NEGATIVE 10/15/2020 1158   BILIRUBINUR Negative 11/12/2014 0232   KETONESUR 20 (A) 10/15/2020 1158   PROTEINUR NEGATIVE 10/15/2020 1158   NITRITE POSITIVE (A) 10/15/2020 1158   LEUKOCYTESUR NEGATIVE 10/15/2020 1158   LEUKOCYTESUR Negative 11/12/2014 0232   Sepsis Labs: Invalid input(s): PROCALCITONIN, LACTICIDVEN  Microbiology: Recent Results (from the past 240 hour(s))  Urine culture     Status: Abnormal   Collection Time: 10/15/20 11:58 AM   Specimen: Urine, Random  Result Value Ref Range Status   Specimen Description   Final    URINE, RANDOM Performed at [facility], [address] [place], [place] [address]    Special Requests   Final    NONE Performed at [facility], [address] [place], [place] [address]    Culture >=100,000 COLONIES/mL ESCHERICHIA COLI (A)  Final   Report Status 10/17/2020 FINAL  Final   Organism ID, Bacteria ESCHERICHIA COLI (A)  Final      Susceptibility   Escherichia coli - MIC*    AMPICILLIN <=2 SENSITIVE Sensitive     CEFAZOLIN <=4 SENSITIVE Sensitive     CEFEPIME <=0.12 SENSITIVE Sensitive     CEFTRIAXONE <=0.25 SENSITIVE Sensitive     CIPROFLOXACIN <=0.25 SENSITIVE Sensitive     GENTAMICIN <=1 SENSITIVE Sensitive     IMIPENEM <=0.25 SENSITIVE Sensitive     NITROFURANTOIN <=16 SENSITIVE Sensitive     TRIMETH/SULFA <=20 SENSITIVE Sensitive     AMPICILLIN/SULBACTAM <=2 SENSITIVE Sensitive     PIP/TAZO <=4 SENSITIVE Sensitive     * >=100,000 COLONIES/mL ESCHERICHIA COLI  Resp Panel by RT-PCR (Flu A&B, Covid)  Nasopharyngeal Swab     Status: None   Collection Time: 10/15/20 12:18 PM   Specimen: Nasopharyngeal Swab; Nasopharyngeal(NP) swabs in vial transport medium  Result Value Ref Range Status   SARS Coronavirus 2 by RT PCR NEGATIVE NEGATIVE Final    Comment: (NOTE) SARS-CoV-2 target nucleic acids are NOT DETECTED.  The SARS-CoV-2 RNA is generally detectable in upper respiratory specimens during the acute phase of infection. The lowest concentration of SARS-CoV-2 viral copies this assay can detect is 138 copies/mL. A negative result does not preclude SARS-Cov-2 infection and should not be used as the sole basis for treatment or other patient management decisions. A negative result may occur with  improper specimen collection/handling, submission of specimen other than nasopharyngeal swab, presence of viral mutation(s) within the areas targeted by this assay, and inadequate number of viral copies(<138 copies/mL). A negative result must be combined with clinical observations, patient history, and epidemiological information. The expected result is Negative.  Fact Sheet for Patients:  BloggerCourse.comhttps://www.fda.gov/media/152166/download  Fact Sheet for Healthcare Providers:  SeriousBroker.ithttps://www.fda.gov/media/152162/download  This test is no t yet approved or cleared by the [place] FDA and  has been authorized for detection and/or diagnosis of SARS-CoV-2 by FDA under an Emergency Use Authorization (EUA). This EUA will remain  in effect (meaning this test can be used) for the duration of the COVID-19 declaration under Section 564(b)(1) of the Act, 21 U.S.C.section 360bbb-3(b)(1), unless the authorization is terminated  or revoked sooner.       Influenza A by PCR NEGATIVE NEGATIVE Final   Influenza B by PCR NEGATIVE NEGATIVE Final    Comment: (NOTE) The Xpert Xpress SARS-CoV-2/FLU/RSV plus assay is intended as an aid in the diagnosis of influenza from Nasopharyngeal swab specimens and should not be  used  as a sole basis for treatment. Nasal washings and aspirates are unacceptable for Xpert Xpress SARS-CoV-2/FLU/RSV testing.  Fact Sheet for Patients: BloggerCourse.com  Fact Sheet for Healthcare Providers: SeriousBroker.it  This test is not yet approved or cleared by the [place] FDA and has been authorized for detection and/or diagnosis of SARS-CoV-2 by FDA under an Emergency Use Authorization (EUA). This EUA will remain in effect (meaning this test can be used) for the duration of the COVID-19 declaration under Section 564(b)(1) of the Act, 21 U.S.C. section 360bbb-3(b)(1), unless the authorization is terminated or revoked.  Performed at [facility], [address], [place], [place] [address]   Surgical pcr screen     Status: None   Collection Time: 10/16/20 12:46 AM   Specimen: Nasal Mucosa; Nasal Swab  Result Value Ref Range Status   MRSA, PCR NEGATIVE NEGATIVE Final   Staphylococcus aureus NEGATIVE NEGATIVE Final    Comment: (NOTE) The Xpert SA Assay (FDA approved for NASAL specimens in patients 66 years of age and older), is one component of a comprehensive surveillance program. It is not intended to diagnose infection nor to guide or monitor treatment. Performed at [facility], [address] [place], [place] [address]   Culture, Urine     Status: None   Collection Time: 10/16/20 11:32 AM   Specimen: Urine, Catheterized  Result Value Ref Range Status   Specimen Description URINE, CATHETERIZED  Final   Special Requests NONE  Final   Culture   Final    NO GROWTH Performed at [facility], [address] [place], [place] [address]    Report Status 10/17/2020 FINAL  Final    Radiology Studies: DG Chest Port 1 View  Result Date: 10/17/2020 CLINICAL DATA:  Recent femur fracture, currently with nausea and vomiting EXAM: PORTABLE CHEST 1 VIEW COMPARISON:  October 16, 2020 FINDINGS:  There is slight bibasilar atelectasis. Lungs otherwise are clear. Heart is mildly enlarged with pulmonary vascularity normal. No adenopathy. Bones are osteoporotic. IMPRESSION: Bibasilar atelectasis. Lungs otherwise clear. Stable cardiac prominence. Osteoporosis. Electronically Signed   By: Bretta Bang III M.D.   On: 10/17/2020 10:22   DG Abd Portable 1V  Result Date: 10/17/2020 CLINICAL DATA:  Nausea and vomiting EXAM: PORTABLE ABDOMEN - 1 VIEW COMPARISON:  None. FINDINGS: Moderate stool throughout colon. No bowel dilatation or air-fluid level to suggest bowel obstruction. No free air. Total hip replacement right. Postoperative change in visualized proximal left femur. IMPRESSION: No bowel obstruction or free air. Moderate stool in colon. Postoperative change each proximal femur region. Electronically Signed   By: Bretta Bang III M.D.   On: 10/17/2020 10:23   DG C-Arm 1-60 Min  Result Date: 10/16/2020 CLINICAL DATA:  Provided history: Surgery, elective. Distal femur ORIF. Provided fluoroscopy time 56 seconds. EXAM: RIGHT FEMUR 2 VIEWS; DG C-ARM 1-60 MIN COMPARISON:  Radiographs of the right femur and pelvis 10/15/2020. FINDINGS: Six intraoperative fluoroscopic images of the right femur are submitted from ORIF of a proximal right femoral fracture. There has been interval ORIF of the previously demonstrated periprosthetic subtrochanteric proximal right femur fracture utilizing a lateral plate and multiple screws. Prior right hip total arthroplasty. Skin staples project in the region of the right groin. IMPRESSION: Six intraoperative fluoroscopic images from ORIF of an a periprosthetic proximal right femur fracture, as described. Electronically Signed   By: Jackey Loge DO   On: 10/16/2020 17:59   DG FEMUR, MIN 2 VIEWS RIGHT  Result Date:  10/16/2020 CLINICAL DATA:  Provided history: Surgery, elective. Distal femur ORIF. Provided fluoroscopy time 56 seconds. EXAM: RIGHT FEMUR 2 VIEWS; DG  C-ARM 1-60 MIN COMPARISON:  Radiographs of the right femur and pelvis 10/15/2020. FINDINGS: Six intraoperative fluoroscopic images of the right femur are submitted from ORIF of a proximal right femoral fracture. There has been interval ORIF of the previously demonstrated periprosthetic subtrochanteric proximal right femur fracture utilizing a lateral plate and multiple screws. Prior right hip total arthroplasty. Skin staples project in the region of the right groin. IMPRESSION: Six intraoperative fluoroscopic images from ORIF of an a periprosthetic proximal right femur fracture, as described. Electronically Signed   By: Jackey Loge DO   On: 10/16/2020 17:59   DG FEMUR PORT, MIN 2 VIEWS RIGHT  Result Date: 10/16/2020 CLINICAL DATA:  Fracture status post ORIF EXAM: RIGHT FEMUR PORTABLE 2 VIEW COMPARISON:  10/15/2020 FINDINGS: Frontal and lateral views of the right femur demonstrate lateral plate and screw fixation traversing the oblique periprosthetic femoral diaphyseal fracture seen previously. Alignment is anatomic. Postsurgical changes are seen within the soft tissues. IMPRESSION: 1. ORIF of a periprosthetic right femoral diaphyseal fracture as above. Electronically Signed   By: Sharlet Salina M.D.   On: 10/16/2020 20:29    Ajanae Virag T. Anneka Studer [facility]  If 7PM-7AM, please contact night-coverage [email or web address] 10/17/2020, 3:40 PM

## 2020-10-17 NOTE — Progress Notes (Signed)
Pt is on duplicate post op abx (cefazolin/ceftriaxone). We will dc cefazolin.  Ulyses Southward, PharmD, BCIDP, AAHIVP, CPP Infectious Disease Pharmacist 10/17/2020 10:06 AM

## 2020-10-17 NOTE — Consult Note (Signed)
Consultation Note Date: 10/17/2020   Patient Name: Kara Moore  DOB: 13-Jul-1935  MRN: [id number]  Age / Sex: 85 y.o., female  PCP: Venia Carbon, MD Referring Physician: Mercy Riding, MD  Reason for Consultation: Establishing goals of care  HPI/Patient Profile: 85 y.o. female  with past medical history of Lewy Body dementia, Parkinson's, and diabetes mellitus. She was brought to the emergency department from her SNF on 10/15/2020 with AMS after having a fall the day befiore. She was found to have a displaced periprosthetic fracture of the right femur as well as possible UTI. Orthopedic surgery consulted and recommended surgical repair, mainly palliative.  Status post ORIF right femur shaft fracture by Dr. Marcelino Scot on 11/29.   Clinical Assessment and Goals of Care: I have reviewed medical records including EPIC notes, labs and imaging, received report from the nurse  and met at bedside with niece [name]) and nephew Elta Guadeloupe) to discuss diagnosis, prognosis, GOC, EOL wishes, disposition, and options.  I introduced Palliative Medicine as specialized medical care for people living with serious illness. It focuses on providing relief from the symptoms and stress of a serious illness.   We discussed a brief life review of the patient. She is originally from [place] area. 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 She did not marry or have children. She did have 2 siblings, and 5 nieces and nephews.   [name] reports the patient's health started to decline in 2014. She suffered several falls and fractures over the next few years. She has required skilled nursing care since 2018. She has also been non-ambulatory since 2018. There has been significant decline in her functional status in the past week. [name] and Elta Guadeloupe report that she was verbal and attempting to feed herself a week  ago.   We discussed her current illness and what it means in the larger context of her ongoing co-morbidities. Detailed discussion was had regarding the diagnosis of dementia and its natural trajectory. This includes decreased ability to communicate, ambulate, swallow, and maintain continence. Discussed that ultimately dementia is a terminal condition, and can be exacerbated by an acute illness or injury.   I attempted to elicit values and goals of care important to the patient. They clearly do not want her to suffer. We did discuss quality of life. They do not feel that she has good quality of life even at baseline.   The difference between aggressive medical intervention and comfort care was considered in light of the patient's goals of care. The family is not interested in aggressive interventions or life-prolonging measures.   Advanced directives, concepts specific to code status, artifical feeding and hydration, and rehospitalization were considered and discussed. I confirmed code status is DNR/DNI. We also discussed the current issue with the patient not swallowing. Family would not want artificial feeding.  Hospice and Palliative Care services outpatient were explained and offered. Family is very open to patient receiving hospice services at the SNF.   Questions and concerns  were addressed.  The family was encouraged to call with questions or concerns.   Primary decision maker: Earlyne Iba (niece/HCPOA) [phone] or Maleaha Hughett (niece/HCPOA) [phone]  SUMMARY OF RECOMMENDATIONS   - DNR/DNI as previously documented - family is hopeful for improvement closer to baseline, but understands she may continue to decline - family wants continued attempts with SLP for swallowing evaluation - family would not want artificial feeding - recommend OOB to chair if able (may need to pre-medicate for pain) - hospice referral for care at the SNF (family prefers [facility])  Code  Status/Advance Care Planning:  DNR  Symptom Management:   Per primary team  Palliative Prophylaxis:   Aspiration, Oral Care and Turn Reposition  Additional Recommendations (Limitations, Scope, Preferences):  No Artificial Feeding  Psycho-social/Spiritual:   Created space and opportunity for patient and family to express thoughts and feelings regarding patient's current medical situation.   Emotional support provided   Prognosis:   < 6 months  Discharge Planning: [place] with Hospice      Primary Diagnoses: Present on Admission: . Memory loss . Hypothyroidism . Parkinson's disease (tremor, stiffness, slow motion, unstable posture) ([place]) . Femur fracture, right ([place])   I have reviewed the medical record, interviewed the patient and family, and examined the patient. The following aspects are pertinent.  Past Medical History:  Diagnosis Date  . Benign essential hypertension   . Confusion   . Diabetes mellitus without complication ([place])   . Hyperlipidemia LDL goal < 100   . Hypothyroidism   . Macular degeneration   . Memory loss   . Senile osteoporosis      Family History  Problem Relation Age of Onset  . Heart attack Father   . Cancer Sister   . Heart attack Brother   . Diabetes Brother   . Tremor Maternal Grandmother   . Diabetes Paternal Grandfather   . Tremor Maternal Aunt   . Diabetes Paternal Grandmother    Scheduled Meds: . carbidopa-levodopa  1 tablet Oral 4 times per day  . Chlorhexidine Gluconate Cloth  6 each Topical Daily  . cholecalciferol  2,000 Units Oral BID  . feeding supplement  237 mL Oral BID BM  . heparin  5,000 Units Subcutaneous Q8H  . insulin aspart  0-15 Units Subcutaneous TID WC  . levothyroxine  50 mcg Oral Q0600  . mirtazapine  7.5 mg Oral QHS  . multivitamin with minerals  1 tablet Oral Daily  . Vitamin D (Ergocalciferol)  50,000 Units Oral Q7 days   Continuous Infusions: . cefTRIAXone (ROCEPHIN)  IV     . lactated ringers 50 mL/hr at 10/17/20 1011   PRN Meds:.acetaminophen **OR** acetaminophen, alum & mag hydroxide-simeth, docusate sodium, magnesium hydroxide, morphine injection, ondansetron (ZOFRAN) IV Medications Prior to Admission:  Prior to Admission medications   Medication Sig Start Date End Date Taking? Authorizing Provider  acetaminophen (TYLENOL) 325 MG tablet Take 325 mg by mouth every 6 (six) hours as needed for mild pain.    Yes [provider]  alum & mag hydroxide-simeth ([place]) 200-200-20 MG/5ML suspension Take 30 mLs by mouth every 6 (six) hours as needed for indigestion or heartburn.   Yes [provider]  carbidopa-levodopa (SINEMET IR) 25-100 MG tablet Take 1 tablet by mouth 4 (four) times daily. Take at 8, 12, 4 PM, 8 PM. 09/12/16  Yes Star Age, MD  fluticasone (FLONASE) 50 MCG/ACT nasal spray Place 2 sprays into both nostrils as needed for allergies.  Yes [provider]  hydroxypropyl methylcellulose / hypromellose (ISOPTO TEARS / GONIOVISC) 2.5 % ophthalmic solution Place 1 drop into both eyes as needed for dry eyes.   Yes [provider]  levothyroxine (SYNTHROID, LEVOTHROID) 50 MCG tablet TAKE 1 TABLET BY MOUTH DAILY BEFORE BREAKFAST ON EMPTY STOMACH *BINGO CARD* Patient taking differently: Take 50 mcg by mouth daily before breakfast.  11/03/14  Yes Lauree Chandler, NP  lisinopril (ZESTRIL) 5 MG tablet Take 5 mg by mouth daily. 09/15/20  Yes [provider]  metFORMIN (GLUCOPHAGE) 500 MG tablet Take 1 tablet (500 mg total) by mouth 2 (two) times daily with a meal. 01/30/15  Yes Reed, Tiffany L, DO  mirtazapine (REMERON) 15 MG tablet Take 15 mg by mouth at bedtime. 09/20/20  Yes [provider]  Multiple Vitamins-Minerals (ICAPS MV) TABS Take 2 tablets by mouth daily.    Yes [provider]  sennosides-docusate sodium (SENOKOT-S) 8.6-50 MG tablet Take 2 tablets by mouth in the morning and at bedtime.    Yes [provider]  Vitamin D, Ergocalciferol, (DRISDOL) 1.25 MG (50000 UNIT) CAPS capsule Take 50,000 Units by mouth every 30 (thirty) days. On the 13th every month   Yes [provider]  calcium carbonate (OS-CAL - DOSED IN MG OF ELEMENTAL CALCIUM) 1250 (500 CA) MG tablet Take 1 tablet by mouth daily.  Patient not taking: Reported on 10/16/2020    [provider]  Cholecalciferol 2000 UNITS CAPS Take 1 capsule (2,000 Units total) by mouth daily. Patient not taking: Reported on 06/28/2017 10/28/14   Hollace Kinnier L, DO  docusate sodium (COLACE) 100 MG capsule Take 1 capsule (100 mg total) by mouth 2 (two) times daily. Patient not taking: Reported on 10/16/2020 07/01/17   Vaughan Basta, MD  enoxaparin (LOVENOX) 40 MG/0.4ML injection Inject 0.4 mLs (40 mg total) into the skin daily. 07/02/17 07/14/17  Vaughan Basta, MD  furosemide (LASIX) 20 MG tablet Take 10 mg by mouth daily. Take 1/2 tablet orally every day Patient not taking: Reported on 10/16/2020    [provider]  glipiZIDE (GLUCOTROL) 5 MG tablet Take 5 mg by mouth daily before breakfast. For Diabetes (elevated blood sugar) Patient not taking: Reported on 10/16/2020 10/18/13   Reed, Tiffany L, DO  GLUCERNA (GLUCERNA) LIQD Take 237 mLs by mouth 2 (two) times daily between meals. Patient not taking: Reported on 10/16/2020 07/01/17   Vaughan Basta, MD  guaifenesin (ROBITUSSIN) 100 MG/5ML syrup Take 200 mg by mouth 4 (four) times daily as needed for cough. Patient not taking: Reported on 10/16/2020    [provider]  magnesium hydroxide (MILK OF MAGNESIA) 400 MG/5ML suspension Take 15 mLs by mouth daily as needed for mild constipation.  Patient not taking: Reported on 10/16/2020    [provider]  memantine (NAMENDA) 5 MG tablet Take 1 tablet (5 mg total) by mouth 2 (two) times daily. Patient not taking: Reported on 10/16/2020 12/12/16   Hollace Kinnier L, DO    mirtazapine (REMERON) 7.5 MG tablet Take 1 tablet (7.5 mg total) by mouth at bedtime. Patient not taking: Reported on 10/16/2020 02/05/16   Hollace Kinnier L, DO  Multiple Vitamins-Minerals (DECUBI-VITE PO) Take 1 capsule by mouth daily. Patient not taking: Reported on 10/16/2020    [provider]  oxyCODONE (OXY IR/ROXICODONE) 5 MG immediate release tablet Take 1 tablet (5 mg total) by mouth every 4 (four) hours as needed for breakthrough pain. Patient not taking: Reported on 10/16/2020 07/01/17  Vaughan Basta, MD  pantoprazole (PROTONIX) 40 MG tablet Take 1 tablet (40 mg total) by mouth daily. Patient not taking: Reported on 10/16/2020 07/02/17   Vaughan Basta, MD  potassium chloride (K-DUR,KLOR-CON) 10 MEQ tablet Take 10 mEq by mouth daily. Take to prevent potassium loss (due to taking fluid pill) Patient not taking: Reported on 10/16/2020    [provider]  pravastatin (PRAVACHOL) 80 MG tablet Take 1 tablet (80 mg total) by mouth daily. For Cholesterol Patient not taking: Reported on 10/16/2020 02/05/16   Hollace Kinnier L, DO  ranitidine (ZANTAC) 150 MG capsule Take 150 mg by mouth at bedtime.  Patient not taking: Reported on 10/16/2020    [provider]  Skin Protectants, Misc. (DIMETHICONE-ZINC OXIDE) cream Apply topically 3 (three) times daily. Patient not taking: Reported on 10/16/2020    [provider]  traMADol (ULTRAM) 50 MG tablet Take one tablet by mouth twice daily for pain as needed andTake one tablet by mouth at bedtime for pain Patient not taking: Reported on 10/16/2020 05/14/17   Fritzi Mandes, MD   No Known Allergies Review of Systems  Unable to perform ROS: Dementia    Physical Exam Vitals reviewed.  Constitutional:      General: She is not in acute distress.    Comments: Frail appearing  Pulmonary:     Effort: Pulmonary effort is normal.     Comments: On room air Neurological:     Motor: Weakness present.      Comments: Non-verbal Eyes closed     Vital Signs: BP 123/72 (BP Location: Right Arm)   Pulse 84   Temp 98.7 F (37.1 C) (Oral)   Resp 16   SpO2 100%  Pain Scale: Faces POSS *See Group Information*: S-Acceptable,Sleep, easy to arouse Pain Score: 0-No pain   SpO2: SpO2: 100 % O2 Device:SpO2: 100 % O2 Flow Rate: .O2 Flow Rate (L/min): 6 L/min  IO: Intake/output summary:   Intake/Output Summary (Last 24 hours) at 10/17/2020 1220 Last data filed at 10/17/2020 0600 Gross per 24 hour  Intake 2399.61 ml  Output 1225 ml  Net 1174.61 ml       Palliative Assessment/Data: PPS 10%     Time In: 15:40 Time Out: 16:55 Time Total: 75 minutes Greater than 50%  of this time was spent counseling and coordinating care related to the above assessment and plan.  Signed by: Lavena Bullion, NP   Please contact Palliative Medicine Team phone at [phone] for questions and concerns.  For individual provider: See Shea Evans

## 2020-10-18 DIAGNOSIS — G9341 Metabolic encephalopathy: Secondary | ICD-10-CM

## 2020-10-18 LAB — COMPREHENSIVE METABOLIC PANEL
ALT: 6 U/L (ref 0–44)
AST: 35 U/L (ref 15–41)
Albumin: 2.4 g/dL — ABNORMAL LOW (ref 3.5–5.0)
Alkaline Phosphatase: 52 U/L (ref 38–126)
Anion gap: 13 (ref 5–15)
BUN: 18 mg/dL (ref 8–23)
CO2: 22 mmol/L (ref 22–32)
Calcium: 8.4 mg/dL — ABNORMAL LOW (ref 8.9–10.3)
Chloride: 105 mmol/L (ref 98–111)
Creatinine, Ser: 0.9 mg/dL (ref 0.44–1.00)
GFR, Estimated: >60 mL/min (ref >60)
Glucose, Bld: 165 mg/dL — ABNORMAL HIGH (ref 70–99)
Potassium: 3.7 mmol/L (ref 3.5–5.1)
Sodium: 140 mmol/L (ref 135–145)
Total Bilirubin: 1.1 mg/dL (ref 0.3–1.2)
Total Protein: 5.1 g/dL — ABNORMAL LOW (ref 6.5–8.1)

## 2020-10-18 LAB — GLUCOSE, CAPILLARY
Glucose-Capillary: 104 mg/dL — ABNORMAL HIGH (ref 70–99)
Glucose-Capillary: 125 mg/dL — ABNORMAL HIGH (ref 70–99)
Glucose-Capillary: 135 mg/dL — ABNORMAL HIGH (ref 70–99)
Glucose-Capillary: 137 mg/dL — ABNORMAL HIGH (ref 70–99)
Glucose-Capillary: 163 mg/dL — ABNORMAL HIGH (ref 70–99)
Glucose-Capillary: 95 mg/dL (ref 70–99)

## 2020-10-18 LAB — RETICULOCYTES
Immature Retic Fract: 20.2 % — ABNORMAL HIGH (ref 2.3–15.9)
RBC.: 2.16 MIL/uL — ABNORMAL LOW (ref 3.87–5.11)
Retic Count, Absolute: 46 10*3/uL (ref 19.0–186.0)
Retic Ct Pct: 2.1 % (ref 0.4–3.1)

## 2020-10-18 LAB — AMMONIA: Ammonia: 44 umol/L — ABNORMAL HIGH (ref 9–35)

## 2020-10-18 LAB — MAGNESIUM: Magnesium: 2.1 mg/dL (ref 1.7–2.4)

## 2020-10-18 LAB — CBC
HCT: 20.9 % — ABNORMAL LOW (ref 36.0–46.0)
Hemoglobin: 6.9 g/dL — CL (ref 12.0–15.0)
MCH: 31.9 pg (ref 26.0–34.0)
MCHC: 33 g/dL (ref 30.0–36.0)
MCV: 96.8 fL (ref 80.0–100.0)
Platelets: 222 10*3/uL (ref 150–400)
RBC: 2.16 MIL/uL — ABNORMAL LOW (ref 3.87–5.11)
RDW: 14.6 % (ref 11.5–15.5)
WBC: 11 10*3/uL — ABNORMAL HIGH (ref 4.0–10.5)
nRBC: 0 % (ref 0.0–0.2)

## 2020-10-18 LAB — PREPARE RBC (CROSSMATCH)

## 2020-10-18 LAB — FERRITIN: Ferritin: 65 ng/mL (ref 11–307)

## 2020-10-18 LAB — HEMOGLOBIN AND HEMATOCRIT, BLOOD
HCT: 25.4 % — ABNORMAL LOW (ref 36.0–46.0)
Hemoglobin: 8.1 g/dL — ABNORMAL LOW (ref 12.0–15.0)

## 2020-10-18 LAB — IRON AND TIBC
Iron: 12 ug/dL — ABNORMAL LOW (ref 28–170)
Saturation Ratios: 6 % — ABNORMAL LOW (ref 10.4–31.8)
TIBC: 202 ug/dL — ABNORMAL LOW (ref 250–450)
UIBC: 190 ug/dL

## 2020-10-18 LAB — FOLATE: Folate: 22.5 ng/mL (ref >5.9)

## 2020-10-18 LAB — TSH: TSH: 1.582 u[IU]/mL (ref 0.350–4.500)

## 2020-10-18 LAB — VITAMIN B12: Vitamin B-12: 481 pg/mL (ref 180–914)

## 2020-10-18 MED ORDER — SODIUM CHLORIDE 0.9% IV SOLUTION: Freq: Once | INTRAVENOUS | Status: DC

## 2020-10-18 MED ORDER — ENOXAPARIN SODIUM 40 MG/0.4ML ~~LOC~~ SOLN: 40.0000 mg | SUBCUTANEOUS | Status: DC

## 2020-10-18 NOTE — Evaluation (Signed)
Physical Therapy Evaluation Patient Details Name: Kara Moore MRN: [id number] DOB: [birth year] Today's Date: 10/18/2020   History of Present Illness  85 year old female, resident of [facility], with history of Lewy body dementia, DM-2, HTN, hypothyroidism, HLD and prior right hemiarthroplasty and left intramedullary nailing brought to ED by EMS due to altered mental status and found to have displaced periprosthetic fracture of right femur. Right ORIF 11/29; NWB RLE  Clinical Impression   Patient evaluated by Physical Therapy with no further acute PT needs identified, as the goals of her care are focused on comfort, and she is at or very near her functional baseline; All education has been completed and the patient's nephew Loraine Leriche has no further questions. Kara Moore comes from a SNF where she was dependent at her baseline for ADLs and mobility; Used a mechanical lift for OOB to wheelchair/chair transfers; Agree with Hospice and Palliative Services follow up;  PT is signing off. Thank you for this referral.     Follow Up Recommendations SNF;Other (comment) (Continuing Hospice and Palliative Services)    Equipment Recommendations  Other (comment) (dependent mechanical lift for transfers)    Recommendations for Other Services       Precautions / Restrictions Precautions Precautions: Fall Restrictions RLE Weight Bearing: Non weight bearing      Mobility  Bed Mobility Overal bed mobility: Needs Assistance Bed Mobility: Rolling (slide up to [facility]) Rolling: Total assist         General bed mobility comments: Total assist and use of bed pad to roll and reposition; Rolled and repositioned using bed positioning controls and bed pad; postitioned in semi-L sidelie, with R hip bolstered, and bed moved into semi-chair position; 4 rails up for safety, bed alarm on    Transfers                    Ambulation/Gait                Stairs            Wheelchair  Mobility    Modified Rankin (Stroke Patients Only)       Balance                                             Pertinent Vitals/Pain Pain Assessment: Faces Faces Pain Scale: Hurts a little bit Pain Location: Grimace and vocalization with gentle pressure to L trunk, and ROM of R LE Pain Descriptors / Indicators: Grimacing (vocalizing) Pain Intervention(s): Monitored during session;Repositioned    Home Living Family/patient expects to be discharged to:: Skilled nursing facility                 Additional Comments: REsident of [facility] SNF    Prior Function Level of Independence: Needs assistance   Gait / Transfers Assistance Needed: Non-amb for 4 years; Staff uses a mechanical lift for transfers OOB  ADL's / Homemaking Assistance Needed: Total Assist  Comments: Per her nephew, Loraine Leriche, Kara Moore had gone to rehab  after a fall and her hip fracture in 2018; at rehab, she did not participate (because of pain, accroding to [facility]), and has been dependent on a mechanical lift for transfers since; Loraine Leriche also indicates that Kara Moore often sits with her eyes closed -- she may be awake, when people might mistaenly thin she is asleep  Hand Dominance        Extremity/Trunk Assessment   Upper Extremity Assessment Upper Extremity Assessment: RUE deficits/detail;LUE deficits/detail RUE Deficits / Details: Observed Kara Moore manipulating her gown slightly, and bedclothes LUE Deficits / Details: Observed Kara Moore manipulating her gown slightly, and bedclothes    Lower Extremity Assessment Lower Extremity Assessment: RLE deficits/detail;LLE deficits/detail RLE Deficits / Details: Able to move RLE passively into hip and knee flexion with relativley free motion; painful though, with noted grimacing and moaning LLE Deficits / Details: Noting rigidity as hip and knee are passivley moved into hip and knee flexion       Communication   Communication:  Other (comment) (minimally verbal)  Cognition Arousal/Alertness:  (Occasional eye-opening) Behavior During Therapy: Flat affect Overall Cognitive Status: History of cognitive impairments - at baseline                                        General Comments      Exercises     Assessment/Plan    PT Assessment Patent does not need any further PT services  PT Problem List         PT Treatment Interventions      PT Goals (Current goals can be found in the Care Plan section)  Acute Rehab PT Goals Patient Stated Goal: did not state PT Goal Formulation: All assessment and education complete, DC therapy    Frequency     Barriers to discharge        Co-evaluation               AM-PAC PT "6 Clicks" Mobility  Outcome Measure Help needed turning from your back to your side while in a flat bed without using bedrails?: Total Help needed moving from lying on your back to sitting on the side of a flat bed without using bedrails?: Total Help needed moving to and from a bed to a chair (including a wheelchair)?: Total Help needed standing up from a chair using your arms (e.g., wheelchair or bedside chair)?: Total Help needed to walk in hospital room?: Total Help needed climbing 3-5 steps with a railing? : Total 6 Click Score: 6    End of Session Equipment Utilized During Treatment: Other (comment) (bed pad) Activity Tolerance: Patient limited by pain Patient left: in bed;with bed alarm set;with family/visitor present;Other (comment) (bed in semi-chair position) Nurse Communication: Mobility status;Need for lift equipment PT Visit Diagnosis: Other abnormalities of gait and mobility (R26.89);Other symptoms and signs involving the nervous system (R29.898)    Time: 1338-1410 PT Time Calculation (min) (ACUTE ONLY): 32 min   Charges:   PT Evaluation $PT Eval Moderate Complexity: 1 Mod PT Treatments $Therapeutic Activity: 8-22 mins        Van Clines, PT   Acute Rehabilitation Services Pager [phone] Office [phone]   Levi Aland 10/18/2020, 4:02 PM

## 2020-10-18 NOTE — Progress Notes (Signed)
PT Cancellation Note  Patient Details Name: Kara Moore MRN: [id number] DOB: [birth year]   Cancelled Treatment:    Reason Eval/Treat Not Completed: Medical issues which prohibited therapy   Pt with Hgb of 6.9 overnight. Has received 1 unit transfusion with repeat labs planned later this morning per nursing. Plan to reattempt later this afternoon or tomorrow.  Van Clines, [place]  Acute Rehabilitation Services Pager [phone] Office [phone]    Levi Aland 10/18/2020, 11:51 AM

## 2020-10-18 NOTE — TOC Initial Note (Addendum)
Transition of Care [facility]) - Initial/Assessment Note    Patient Details  Name: Kara Moore MRN: [id number] Date of Birth: Sep 02, 1935  Transition [facility] Care [facility]) CM/SW Contact:    Curlene Labrum, RN Phone Number: 10/18/2020, 2:03 PM  Clinical Narrative:                 Case management met with the patient at the bedside but patient is resting and should have PT and OT eval to complete FL2.  I called [facility] and spoke with Seth Bake, [place] and the patient is a long term resident at the facility and is expected to return there after she is medically stable for discharge.  The patient received PRBC's after a low hemoglobin after surgery and is pending to work with therapy when able.  Case management will need to complete the [facility] and send the patient back to the facility once she is medically stable for discharge.  10/18/2020 1542 - I spoke with Royann Shivers concerning family's interest in hospice services at [facility] facility - and called Christlyn Edison Pace, [facility] with [facility] and sent her the referral to follow up with the family in regards to hospice services.  She plans to follow up.  Expected Discharge Plan: Long Term Nursing Home Barriers to Discharge: Continued Medical Work up   Patient Goals and CMS Choice Patient states their goals for this hospitalization and ongoing recovery are:: Patient will be returning back to [facility] once she is medically clear for discharge. CMS Medicare.gov Compare Post Acute Care list provided to:: Patient    Expected Discharge Plan and Services Expected Discharge Plan: [place]   Discharge Planning Services: CM Consult   Living arrangements for the past 2 months: [place]                                      Prior Living Arrangements/Services Living arrangements for the past 2 months: [place] Lives with:: Facility Resident Patient language and need for interpreter  reviewed:: Yes Do you feel safe going back to the place where you live?: Yes      Need for Family Participation in Patient Care: Yes (Comment) Care giver support system in place?: Yes (comment)   Criminal Activity/Legal Involvement Pertinent to Current Situation/Hospitalization: No - Comment as needed  Activities of Daily Living Home Assistive Devices/Equipment: Wheelchair ADL Screening (condition at time of admission) Patient's cognitive ability adequate to safely complete daily activities?: No Is the patient deaf or have difficulty hearing?: Yes Does the patient have difficulty seeing, even when wearing glasses/contacts?: Yes Does the patient have difficulty concentrating, remembering, or making decisions?: Yes Patient able to express need for assistance with ADLs?: No Does the patient have difficulty dressing or bathing?: Yes Independently performs ADLs?: No Communication: Needs assistance Is this a change from baseline?: Pre-admission baseline Dressing (OT): Dependent Is this a change from baseline?: Pre-admission baseline Grooming: Dependent Is this a change from baseline?: Pre-admission baseline Feeding: Dependent Is this a change from baseline?: Pre-admission baseline Bathing: Dependent Is this a change from baseline?: Pre-admission baseline Toileting: Dependent Is this a change from baseline?: Pre-admission baseline In/Out Bed: Dependent Is this a change from baseline?: Pre-admission baseline Walks in Home: Dependent Is this a change from baseline?: Pre-admission baseline Does the patient have difficulty walking or climbing stairs?: Yes Weakness of Legs: Both Weakness of Arms/Hands: Both  Permission Sought/Granted Permission sought to share information with : Case Production designer, theatre/television/film granted to share info w AGENCY: [place] granted to share info w Relationship: relatives listed on facesheet     Emotional Assessment Appearance::  Appears stated age Attitude/Demeanor/Rapport: Unable to Assess Affect (typically observed): Unable to Assess Orientation: : Fluctuating Orientation (Suspected and/or reported Sundowners) (disoriented x 4 - history of dementia and Parkinson's disease) Alcohol / Substance Use: Not Applicable Psych Involvement: No (comment)  Admission diagnosis:  Femur fracture, right ([place]) [S72.91XA] Patient Active Problem List   Diagnosis Date Noted  . Femur fracture, right ([place]) 10/15/2020  . Pressure injury of skin 06/29/2017  . Closed left hip fracture ([place]) 06/28/2017  . Hip fracture, unspecified laterality, closed, initial encounter ([place]) 05/11/2017  . Hip fracture ([place]) 05/11/2017  . Mixed incontinence urge and stress 12/12/2016  . Primary osteoarthritis of both knees 07/21/2014  . Essential hypertension, benign 07/21/2014  . Other and unspecified hyperlipidemia 07/21/2014  . Osteoarthritis of right knee 01/31/2014  . Diabetes mellitus type 2, uncomplicated ([place]) 49/70/2637  . Right calf pain 01/17/2014  . Parkinson's disease (tremor, stiffness, slow motion, unstable posture) ([place]) 10/19/2013  . Memory loss   . Hypothyroidism   . Macular degeneration   . Senile osteoporosis   . Hyperlipidemia   . Recurrent falls 06/17/2013   PCP:  Venia Carbon, MD Pharmacy:   [facility], [place], [place], [place] [address] Phone: [phone] Fax: [phone]  [place], [address]. [place]. [place] [address] Phone: [phone] Fax: [phone]     Social Determinants of Health (SDOH) Interventions    Readmission Risk Interventions Readmission Risk Prevention Plan 10/18/2020  Transportation Screening Complete  PCP or Specialist Appt within 5-7 Days Complete  Home Care Screening Complete  Medication Review (RN CM) Complete  Some recent data might be hidden

## 2020-10-18 NOTE — Progress Notes (Addendum)
Civil engineer, contracting (ACC)  Received request from [facility] for hospice services at [facility] after discharge.  Chart and pt information under review by [facility] physician.  Hospice eligibility pending at this time.    Liaisons to reach out tomorrow to family.  Thank you for the opportunity to participate in this pt's care.  Gillian Scarce, BSN, RN [employer] [phone] (24h on call)

## 2020-10-18 NOTE — Progress Notes (Signed)
OT Cancellation Note  Patient Details Name: Kara Moore MRN: [id number] DOB: Feb 23, 1935   Cancelled Treatment:    Reason Eval/Treat Not Completed: Medical issues which prohibited therapy. Pt with Hgb of 6.9 overnight. Has received 1 unit transfusion with repeat labs planned later this morning per nursing. Plan to reattempt later this afternoon or tomorrow.   Raynald Kemp, OT Acute Rehabilitation Services Pager: [phone] Office: [phone]  10/18/2020, 10:21 AM

## 2020-10-18 NOTE — Progress Notes (Signed)
Orthopaedic Trauma Service Progress Note  Patient ID: Kara Moore MRN: [id number] DOB/AGE: [birth year] 85 y.o.  Subjective:  Sleeping  Received transfusion this am    ROS Nonverbal   Objective:   VITALS:   Vitals:   10/18/20 0516 10/18/20 0518 10/18/20 0549 10/18/20 0842  BP: 106/85 116/69 (!) 114/57 130/84  Pulse: 87 85 83 86  Resp:  18 18 18   Temp: 99.4 F (37.4 C) 99.6 F (37.6 C) 100.1 F (37.8 C) 99 F (37.2 C)  TempSrc:  Axillary  Axillary  SpO2: 99% 99% 97% 100%    Estimated body mass index is 21.38 kg/m as calculated from the following:   Height as of an earlier encounter on 10/15/20: 5\' 4"  (1.626 m).   Weight as of an earlier encounter on 10/15/20: 56.5 kg.   Intake/Output      11/30 0701 - 12/01 0700 12/01 0701 - 12/02 0700   P.O. 0    I.V. 372.2    Blood 315    IV Piggyback 100    Total Intake 787.2    Urine     Blood     Total Output     Net +787.2         Urine Occurrence 3 x      LABS  Results for orders placed or performed during the hospital encounter of 10/15/20 (from the past 24 hour(s))  Glucose, capillary     Status: Abnormal   Collection Time: 10/17/20 12:28 PM  Result Value Ref Range   Glucose-Capillary 161 (H) 70 - 99 mg/dL  Glucose, capillary     Status: None   Collection Time: 10/17/20  5:13 PM  Result Value Ref Range   Glucose-Capillary 77 70 - 99 mg/dL  Glucose, capillary     Status: Abnormal   Collection Time: 10/17/20  9:10 PM  Result Value Ref Range   Glucose-Capillary 134 (H) 70 - 99 mg/dL  Glucose, capillary     Status: Abnormal   Collection Time: 10/18/20 12:31 AM  Result Value Ref Range   Glucose-Capillary 163 (H) 70 - 99 mg/dL  Magnesium     Status: None   Collection Time: 10/18/20  1:43 AM  Result Value Ref Range   Magnesium 2.1 1.7 - 2.4 mg/dL  Vitamin 14/01/21     Status: None   Collection Time: 10/18/20  1:43 AM  Result Value Ref  Range   Vitamin B-12 481 180 - 914 pg/mL  Iron and TIBC     Status: Abnormal   Collection Time: 10/18/20  1:43 AM  Result Value Ref Range   Iron 12 (L) 28 - 170 ug/dL   TIBC 14/01/21 (L) 14/01/21 - 390 ug/dL   Saturation Ratios 6 (L) 10.4 - 31.8 %   UIBC 190 ug/dL  Ferritin     Status: None   Collection Time: 10/18/20  1:43 AM  Result Value Ref Range   Ferritin 65 11 - 307 ng/mL  CBC     Status: Abnormal   Collection Time: 10/18/20  1:43 AM  Result Value Ref Range   WBC 11.0 (H) 4.0 - 10.5 K/uL   RBC 2.16 (L) 3.87 - 5.11 MIL/uL   Hemoglobin 6.9 (LL) 12.0 - 15.0 g/dL   HCT 14/01/21 (L) 36 - 46 %   MCV 96.8  80.0 - 100.0 fL   MCH 31.9 26.0 - 34.0 pg   MCHC 33.0 30.0 - 36.0 g/dL   RDW 38.1 01.7 - 51.0 %   Platelets 222 150 - 400 K/uL   nRBC 0.0 0.0 - 0.2 %  Comprehensive metabolic panel     Status: Abnormal   Collection Time: 10/18/20  1:43 AM  Result Value Ref Range   Sodium 140 135 - 145 mmol/L   Potassium 3.7 3.5 - 5.1 mmol/L   Chloride 105 98 - 111 mmol/L   CO2 22 22 - 32 mmol/L   Glucose, Bld 165 (H) 70 - 99 mg/dL   BUN 18 8 - 23 mg/dL   Creatinine, Ser 2.58 0.44 - 1.00 mg/dL   Calcium 8.4 (L) 8.9 - 10.3 mg/dL   Total Protein 5.1 (L) 6.5 - 8.1 g/dL   Albumin 2.4 (L) 3.5 - 5.0 g/dL   AST 35 15 - 41 U/L   ALT 6 0 - 44 U/L   Alkaline Phosphatase 52 38 - 126 U/L   Total Bilirubin 1.1 0.3 - 1.2 mg/dL   GFR, Estimated >52 >77 mL/min   Anion gap 13 5 - 15  Folate     Status: None   Collection Time: 10/18/20  1:43 AM  Result Value Ref Range   Folate 22.5 >5.9 ng/mL  Reticulocytes     Status: Abnormal   Collection Time: 10/18/20  1:45 AM  Result Value Ref Range   Retic Ct Pct 2.1 0.4 - 3.1 %   RBC. 2.16 (L) 3.87 - 5.11 MIL/uL   Retic Count, Absolute 46.0 19.0 - 186.0 K/uL   Immature Retic Fract 20.2 (H) 2.3 - 15.9 %  Ammonia     Status: Abnormal   Collection Time: 10/18/20  1:45 AM  Result Value Ref Range   Ammonia 44 (H) 9 - 35 umol/L  TSH     Status: None   Collection Time:  10/18/20  1:45 AM  Result Value Ref Range   TSH 1.582 0.350 - 4.500 uIU/mL  Prepare RBC (crossmatch)     Status: None   Collection Time: 10/18/20  4:38 AM  Result Value Ref Range   Order Confirmation      ORDER PROCESSED BY BLOOD BANK Performed at [facility], [address] [place], [place] [address]   Glucose, capillary     Status: Abnormal   Collection Time: 10/18/20  6:52 AM  Result Value Ref Range   Glucose-Capillary 135 (H) 70 - 99 mg/dL  Glucose, capillary     Status: Abnormal   Collection Time: 10/18/20  7:35 AM  Result Value Ref Range   Glucose-Capillary 137 (H) 70 - 99 mg/dL     PHYSICAL EXAM:   Gen: in bed, NAD, nonverbal  Ext:       Right Lower Extremity              Dressing c/d/i              Ext warm              Minimal swelling             Not participatory in exam              + DP pulse      Assessment/Plan: 2 Days Post-Op   Active Problems:   Memory loss   Hypothyroidism   Parkinson's disease (tremor, stiffness, slow motion, unstable posture) (HCC)   Diabetes mellitus  type 2, uncomplicated (HCC)   Femur fracture, right (HCC)   Anti-infectives (From admission, onward)   Start     Dose/Rate Route Frequency Ordered Stop   10/16/20 2200  ceFAZolin (ANCEF) IVPB 2g/100 mL premix  Status:  Discontinued        2 g 200 mL/hr over 30 Minutes Intravenous Every 8 hours 10/16/20 2000 10/17/20 1005   10/16/20 1330  ceFAZolin (ANCEF) IVPB 2g/100 mL premix        2 g 200 mL/hr over 30 Minutes Intravenous To [facility]Stay [facility] 10/15/20 2320 10/16/20 1431   10/16/20 1215  cefTRIAXone (ROCEPHIN) 1 g in sodium chloride 0.9 % 100 mL IVPB        1 g 200 mL/hr over 30 Minutes Intravenous Every 24 hours 10/16/20 1127 10/19/20 1214   10/16/20 1200  ceFAZolin (ANCEF) IVPB 2g/100 mL premix  Status:  Discontinued        2 g 200 mL/hr over 30 Minutes Intravenous To ShortStay Surgical 10/16/20 1104 10/16/20 1120    .  POD/HD#: 2  85 y/o female s/p fall,  nonambulatory, nonverbal    - R periprosthetic proximal femur fracture s/p ORIF             NWB R leg             PT/OT             Knee and hips are very stiff at baseline                         Knee does not come into full extension which is expected since she sits most of the time              Dressing changes as needed    - Pain management:             Tylenol              Minimize narcotics    - ABL anemia/Hemodynamics             transfusion today   Monitor    - Medical issues              Per primary    - DVT/PE prophylaxis:             SQ heparin while in hospital   - ID:              periop abx completed    - Metabolic Bone Disease:             Vitamin d deficiency                          Supplement for now    - Dispo:             Ortho issues stable              Return to facility once cleared by medicine and once GOC established with palliative medicine team    Mearl Latin, PA-C [phone] (C) 10/18/2020, 9:24 AM  [facility] [address] [place] [address] [phone] Val [phone] (F)    After 5pm and on the weekends please log on to Amion, go to orthopaedics and the look under the [facility] Call for the provider(s) on call. You can also call our office at [phone] and then follow the prompts to be connected  to the call team.

## 2020-10-18 NOTE — Progress Notes (Signed)
PROGRESS NOTE    Kara Moore  WCB:[id number] DOB: 1935-03-13 DOA: 10/15/2020 PCP: Karie Schwalbe, MD    Brief Narrative:  Kara Moore was admitted to the hospital with a working diagnosis of right femoral periprosthetic fracture.  85 year old female with past medical history of hypertension, dyslipidemia, type 2 diabetes mellitus, Parkinson's disease and Lewy body dementia who presents with altered mentation.  Apparently she had a full the day prior to admission that will not require further evaluation.  Following day patient was noted to be confused, more than usual, not at her baseline.  On her initial physical examination her temperature was 97.8, blood pressure 101/50, heart rate 89, respiratory rate 18.  While positioning it for a straight cath  there was a popping sound and immediate mild rotation of the right leg.  Her lungs are clear to auscultation, heart S1-S2, present, frequent PVCs, soft 2/6 systolic murmur, abdomen soft, lower extremities no edema, she had cogwheel rigidity. Sodium 139, potassium 3.9, chloride 102, bicarb 25, glucose 191, BUN 15, creatinine 0.64, white count 9.2, hemoglobin 9.9, hematocrit 30.5, platelets 315.  Pelvic radiograph with prior right total hip arthroplasty with an acute, displaced periprosthetic fracture of the subtrochanteric aspect of the proximal right femur.  Positive osteopenia.   Patient initially evaluated at [facility], transferred to [facility] for further orthopedic intervention.  Patient underwent open reduction internal fixation right femur shaft fracture on 11/29.   Assessment & Plan:   Principal Problem:   Acute metabolic encephalopathy Active Problems:   Memory loss   Hypothyroidism   Parkinson's disease (tremor, stiffness, slow motion, unstable posture) (HCC)   Diabetes mellitus type 2, uncomplicated (HCC)   Femur fracture, right (HCC)   1. Acute metabolic encephalopathy/ Parkinson's disease and Lewy body dementia. Patient has  been refusing to take her oral medications. At the time of my examination she follows commands and answers simple questions.  Continue neuro checks per unit protocol, fall and aspiration precautions.  Considering degree of dementia, patient will qualify for palliative care or hospice services, follow with palliative care consultation.  On mirtazapine  2. Right periprosthetic subtrochanteric fracture. Continue pain control and DVT prophylaxis.  Limited mobility.   3. T2DM. Continue glucose cover and monitoring with insulin sliding scale.   4. Hypothyroid. Continue with levothyroxine   5. AKI. Improved renal function, continue to hold on antihypertensive medications. Avoid hypotension and nephrotoxic medications.  Continue gentle hydration with LR at 50 ml per hr, follow on renal panel in am.    6. Chronic anemia Stable Hgb and Hct.   7. HTN. Continue to hold on antihypertensive medications for now.     Status is: Inpatient  Remains inpatient appropriate because:Inpatient level of care appropriate due to severity of illness   Dispo: The patient is from: Home              Anticipated d/c is to: SNF              Anticipated d/c date is: 1 day              Patient currently is medically stable to d/c.   DVT prophylaxis: Enoxaparin   Code Status:   DNR   Family Communication:  No family at the bedside     Nutrition Status: Nutrition Problem: Increased nutrient needs Etiology: post-op healing Signs/Symptoms: estimated needs Interventions: Ensure Enlive (each supplement provides 350kcal and 20 grams of protein), MVI       Consultants:   Orthopedics  Palliative Care  Procedures:  Left hip ORIF   Antimicrobials:      Subjective: Patient is not very interactive only responding to simple questions, denies any pain, nausea, vomiting or dyspnea.   Objective: Vitals:   10/18/20 0549 10/18/20 0750 10/18/20 0842 10/18/20 1537  BP: (!) 114/57 131/72 130/84 130/78    Pulse: 83 85 86   Resp: 18 17 18    Temp: 100.1 F (37.8 C) 99.5 F (37.5 C) 99 F (37.2 C)   TempSrc:  Oral Axillary   SpO2: 97% 97% 100%     Intake/Output Summary (Last 24 hours) at 10/18/2020 1630 Last data filed at 10/18/2020 0536 Gross per 24 hour  Intake 315 ml  Output --  Net 315 ml   There were no vitals filed for this visit.  Examination:   General: Not in pain or dyspnea, deconditioned  Neurology: Awake and alert, non focal  E ENT: no pallor, no icterus, oral mucosa moist Cardiovascular: No JVD. S1-S2 present, rhythmic, no gallops, rubs, or murmurs. No lower extremity edema. Pulmonary: positive breath sounds bilaterally, adequate air movement, no wheezing, rhonchi or rales. Gastrointestinal. Abdomen soft and non tender Skin. No rashes Musculoskeletal: no joint deformities     Data Reviewed: I have personally reviewed following labs and imaging studies  CBC: Recent Labs  Lab 10/15/20 1158 10/15/20 1158 10/16/20 0416 10/17/20 0501 10/17/20 0922 10/18/20 0143 10/18/20 1131  WBC 7.6  --  9.2 8.6 11.5* 11.0*  --   NEUTROABS 5.1  --   --   --   --   --   --   HGB 12.8   < > 9.9* 7.4* 7.2* 6.9* 8.1*  HCT 37.9   < > 30.5* 23.0* 22.9* 20.9* 25.4*  MCV 94.5  --  95.6 97.5 97.9 96.8  --   PLT 401*  --  315 237 232 222  --    < > = values in this interval not displayed.   Basic Metabolic Panel: Recent Labs  Lab 10/15/20 1312 10/16/20 0416 10/17/20 0501 10/18/20 0143  NA 139 138 138 140  K 3.9 4.3 4.2 3.7  CL 102 101 102 105  CO2 25 24 24 22   GLUCOSE 191* 176* 162* 165*  BUN 15 18 19 18   CREATININE 0.64 1.11* 0.99 0.90  CALCIUM 9.1 9.0 8.3* 8.4*  MG  --   --  1.5* 2.1  PHOS  --   --  3.4  --    GFR: Estimated Creatinine Clearance: 40.9 mL/min (by C-G formula based on SCr of 0.9 mg/dL). Liver Function Tests: Recent Labs  Lab 10/15/20 1312 10/17/20 0501 10/18/20 0143  AST 23 26 35  ALT 15 <5 6  ALKPHOS 79 52 52  BILITOT 0.5 0.7 1.1  PROT  6.8 5.0* 5.1*  ALBUMIN 3.5 2.7* 2.4*   No results for input(s): LIPASE, AMYLASE in the last 168 hours. Recent Labs  Lab 10/18/20 0145  AMMONIA 44*   Coagulation Profile: No results for input(s): INR, PROTIME in the last 168 hours. Cardiac Enzymes: No results for input(s): CKTOTAL, CKMB, CKMBINDEX, TROPONINI in the last 168 hours. BNP (last 3 results) No results for input(s): PROBNP in the last 8760 hours. HbA1C: Recent Labs    10/16/20 0416  HGBA1C 6.7*   CBG: Recent Labs  Lab 10/18/20 0031 10/18/20 0652 10/18/20 0735 10/18/20 1120 10/18/20 1557  GLUCAP 163* 135* 137* 125* 95   Lipid Profile: No results for input(s): CHOL, HDL, LDLCALC, TRIG, CHOLHDL, LDLDIRECT  in the last 72 hours. Thyroid Function Tests: Recent Labs    10/18/20 0145  TSH 1.582   Anemia Panel: Recent Labs    10/18/20 0143 10/18/20 0145  VITAMINB12 481  --   FOLATE 22.5  --   FERRITIN 65  --   TIBC 202*  --   IRON 12*  --   RETICCTPCT  --  2.1      Radiology Studies: I have reviewed all of the imaging during this hospital visit personally     Scheduled Meds: . sodium chloride   Intravenous Once  . carbidopa-levodopa  1 tablet Oral 4 times per day  . Chlorhexidine Gluconate Cloth  6 each Topical Daily  . cholecalciferol  2,000 Units Oral BID  . heparin  5,000 Units Subcutaneous Q8H  . insulin aspart  0-15 Units Subcutaneous TID WC  . levothyroxine  50 mcg Oral Q0600  . mirtazapine  7.5 mg Oral QHS  . multivitamin with minerals  1 tablet Oral Daily  . Vitamin D (Ergocalciferol)  50,000 Units Oral Q7 days   Continuous Infusions: . lactated ringers 50 mL/hr at 10/17/20 1011     LOS: 3 days        Ryden Wainer Annett Gula, MD

## 2020-10-18 NOTE — Progress Notes (Signed)
  Speech Language Pathology Treatment: Dysphagia  Patient Details Name: Kara Moore MRN: [id number] DOB: [birth year] Today's Date: 10/18/2020 Time: [id number] SLP Time Calculation (min) (ACUTE ONLY): 26 min  Assessment / Plan / Recommendation Clinical Impression  F/u after yesterday's initial swallowing assessment.  Now with purewick, so I set up another suctioning unit to allow for oral care, which is important for Ms. [place] to have. While she appeared to be somnolent, once she was repositioning in bed, oral care was provided, and manual assist was given to hold her head upright, she actively accepted and masticated ice chips when they were placed against her lips. The first swallow of water from a straw elicited coughing, but all subsequent swallows of water were handled without any s/s of aspiration (at least four ounces). She did not follow commands, respond to cues, nor open her eyes, but she automatically drew from the straw and her pharyngeal swallow appeared to be quite functional.   Recommend starting thin liquids. Pt must be positioned upright and physical support is needed to lift her head.  Straw must be brought to her lips.  Please don't allow her presentation of somnolence to belie her ability to actually drink liquids safely.  It is unlikely she can eat solid foods at this time, nor that she can drink enough to support her needs, but she can certainly consume liquids for pleasure and to help maintain the health of her oral mucosa.  D/W RN.  SLP will follow.    HPI HPI: 85 year old female, resident of [facility], with history of Lewy body dementia, DM-2, HTN, hypothyroidism, HLD and prior right hemiarthroplasty and left intramedullary nailing brought to ED by EMS due to altered mental status and found to have displaced periprosthetic fracture of right femur. Right ORIF 11/29.       SLP Plan  Continue with current plan of care       Recommendations  Diet recommendations:   (thin liquids) Liquids provided via: Straw Supervision: Trained caregiver to feed patient Postural Changes and/or Swallow Maneuvers: Seated upright 90 degrees                Oral Care Recommendations: Oral care prior to ice chip/H20 Follow up Recommendations: Skilled Nursing facility SLP Visit Diagnosis: Dysphagia, unspecified (R13.10) Plan: Continue with current plan of care       GO              Solon Alban L. Samson Frederic, MA CCC/SLP Acute Rehabilitation Services Office number [phone] Pager [phone]  Blenda Mounts Laurice 10/18/2020, 11:29 AM

## 2020-10-19 ENCOUNTER — Encounter (HOSPITAL_COMMUNITY): Payer: Self-pay | Admitting: Orthopedic Surgery

## 2020-10-19 LAB — BASIC METABOLIC PANEL
Anion gap: 14 (ref 5–15)
BUN: 14 mg/dL (ref 8–23)
CO2: 21 mmol/L — ABNORMAL LOW (ref 22–32)
Calcium: 8.5 mg/dL — ABNORMAL LOW (ref 8.9–10.3)
Chloride: 109 mmol/L (ref 98–111)
Creatinine, Ser: 0.73 mg/dL (ref 0.44–1.00)
GFR, Estimated: >60 mL/min (ref >60)
Glucose, Bld: 117 mg/dL — ABNORMAL HIGH (ref 70–99)
Potassium: 3.4 mmol/L — ABNORMAL LOW (ref 3.5–5.1)
Sodium: 144 mmol/L (ref 135–145)

## 2020-10-19 LAB — TYPE AND SCREEN
ABO/RH(D): O NEG
Antibody Screen: NEGATIVE
Unit division: 0

## 2020-10-19 LAB — BPAM RBC
Blood Product Expiration Date: 202112182359
ISSUE DATE / TIME: 202112010502
Unit Type and Rh: 9500

## 2020-10-19 LAB — GLUCOSE, CAPILLARY
Glucose-Capillary: 111 mg/dL — ABNORMAL HIGH (ref 70–99)
Glucose-Capillary: 119 mg/dL — ABNORMAL HIGH (ref 70–99)
Glucose-Capillary: 120 mg/dL — ABNORMAL HIGH (ref 70–99)
Glucose-Capillary: 113 mg/dL — ABNORMAL HIGH (ref 70–99)

## 2020-10-19 LAB — SARS CORONAVIRUS 2 BY RT PCR (HOSPITAL ORDER, PERFORMED IN ~~LOC~~ HOSPITAL LAB): SARS Coronavirus 2: NEGATIVE

## 2020-10-19 LAB — MAGNESIUM: Magnesium: 1.8 mg/dL (ref 1.7–2.4)

## 2020-10-19 MED ORDER — ACETAMINOPHEN 325 MG PO TABS: 650.0000 mg | ORAL_TABLET | ORAL | Status: AC | PRN

## 2020-10-19 MED ORDER — VITAMIN D (ERGOCALCIFEROL) 1.25 MG (50000 UNIT) PO CAPS: 50000.0000 [IU] | ORAL_CAPSULE | ORAL | 0 refills | Status: AC

## 2020-10-19 MED ORDER — VITAMIN D3 25 MCG PO TABS: 2000.0000 [IU] | ORAL_TABLET | Freq: Two times a day (BID) | ORAL | 0 refills | Status: AC

## 2020-10-19 NOTE — Progress Notes (Signed)
Orthopaedic Trauma Service Progress Note  Patient ID: Kara Moore MRN: [id number] DOB/AGE: 1934/12/24 85 y.o.  Subjective:  Ortho issues stable   Review of Systems  Unable to perform ROS: Dementia    Objective:   VITALS:   Vitals:   10/18/20 1537 10/18/20 2009 10/19/20 0327 10/19/20 0749  BP: 130/78 (!) 148/86 (!) 136/99 140/84  Pulse:  87 78 79  Resp:  15 16 14   Temp:  98.9 F (37.2 C) 99.1 F (37.3 C) 99.5 F (37.5 C)  TempSrc:  Oral Oral Oral  SpO2:  98% 100% 99%    Estimated body mass index is 21.38 kg/m as calculated from the following:   Height as of an earlier encounter on 10/15/20: 5\' 4"  (1.626 m).   Weight as of an earlier encounter on 10/15/20: 56.5 kg.   Intake/Output      12/01 0701 - 12/02 0700 12/02 0701 - 12/03 0700   P.O.     I.V.     Blood     IV Piggyback     Total Intake     Net          Urine Occurrence 2 x      LABS  Results for orders placed or performed during the hospital encounter of 10/15/20 (from the past 24 hour(s))  Glucose, capillary     Status: None   Collection Time: 10/18/20  3:57 PM  Result Value Ref Range   Glucose-Capillary 95 70 - 99 mg/dL  Glucose, capillary     Status: Abnormal   Collection Time: 10/18/20  8:09 PM  Result Value Ref Range   Glucose-Capillary 104 (H) 70 - 99 mg/dL  Magnesium     Status: None   Collection Time: 10/19/20  2:42 AM  Result Value Ref Range   Magnesium 1.8 1.7 - 2.4 mg/dL  Basic metabolic panel     Status: Abnormal   Collection Time: 10/19/20  2:42 AM  Result Value Ref Range   Sodium 144 135 - 145 mmol/L   Potassium 3.4 (L) 3.5 - 5.1 mmol/L   Chloride 109 98 - 111 mmol/L   CO2 21 (L) 22 - 32 mmol/L   Glucose, Bld 117 (H) 70 - 99 mg/dL   BUN 14 8 - 23 mg/dL   Creatinine, Ser 14/02/21 0.44 - 1.00 mg/dL   Calcium 8.5 (L) 8.9 - 10.3 mg/dL   GFR, Estimated 14/02/21 7.67 mL/min   Anion gap 14 5 - 15  Glucose,  capillary     Status: Abnormal   Collection Time: 10/19/20  6:36 AM  Result Value Ref Range   Glucose-Capillary 111 (H) 70 - 99 mg/dL  Glucose, capillary     Status: Abnormal   Collection Time: 10/19/20 11:12 AM  Result Value Ref Range   Glucose-Capillary 119 (H) 70 - 99 mg/dL     PHYSICAL EXAM:   Gen: in bed, NAD, nonverbal, eyes open  Ext:       Right Lower Extremity              Dressing c/d/i              Ext warm              Minimal swelling  Not participatory in exam              + DP pulse      Assessment/Plan: 3 Days Post-Op   Principal Problem:   Acute metabolic encephalopathy Active Problems:   Memory loss   Hypothyroidism   Parkinson's disease (tremor, stiffness, slow motion, unstable posture) (HCC)   Diabetes mellitus type 2, uncomplicated (HCC)   Femur fracture, right (HCC)   Anti-infectives (From admission, onward)   Start     Dose/Rate Route Frequency Ordered Stop   10/16/20 2200  ceFAZolin (ANCEF) IVPB 2g/100 mL premix  Status:  Discontinued        2 g 200 mL/hr over 30 Minutes Intravenous Every 8 hours 10/16/20 2000 10/17/20 1005   10/16/20 1330  ceFAZolin (ANCEF) IVPB 2g/100 mL premix        2 g 200 mL/hr over 30 Minutes Intravenous To ShortStay Surgical 10/15/20 2320 10/16/20 1431   10/16/20 1215  cefTRIAXone (ROCEPHIN) 1 g in sodium chloride 0.9 % 100 mL IVPB        1 g 200 mL/hr over 30 Minutes Intravenous Every 24 hours 10/16/20 1127 10/18/20 1300   10/16/20 1200  ceFAZolin (ANCEF) IVPB 2g/100 mL premix  Status:  Discontinued        2 g 200 mL/hr over 30 Minutes Intravenous To ShortStay Surgical 10/16/20 1104 10/16/20 1120    .  POD/HD#: 76  85 y/o female s/p fall, nonambulatory, nonverbal    - R periprosthetic proximal femur fracture s/p ORIF             NWB R leg             PT/OT              Dressing changes as needed    - Pain management:             Tylenol              Minimize narcotics    - ABL  anemia/Hemodynamics            transfused yesterday   Cbc in am    - Medical issues              Per primary    - DVT/PE prophylaxis:             SQ heparin while in hospital  No pharmacologics at dc given baseline level of activity    - ID:              periop abx completed    - Metabolic Bone Disease:             Vitamin d deficiency                          Supplement for now    - Dispo:             Ortho issues stable              Return to facility once cleared by medicine and once GOC established with palliative medicine team   Follow up with ortho in 2 weeks    Can dc sutures on 11/01/2020     Mearl Latin, PA-C [phone] (C) 10/19/2020, 11:49 AM  [facility] [address] [place] [address] [phone] [place] [phone] (F)    After 5pm and on the weekends please log on to Amion, go to  orthopaedics and the look under the [facility] Call for the provider(s) on call. You can also call our office at [phone] and then follow the prompts to be connected to the call team.

## 2020-10-19 NOTE — Progress Notes (Signed)
Agree with previous assessment

## 2020-10-19 NOTE — Addendum Note (Signed)
Addendum  created 10/19/20 1552 by Achille Rich, MD   Attestation recorded in Intraprocedure, Intraprocedure Attestations filed

## 2020-10-19 NOTE — Progress Notes (Signed)
OT Cancellation Note  Patient Details Name: Kara Moore MRN: [id number] DOB: 09-Apr-1935   Cancelled Treatment:    Reason Eval/Treat Not Completed: OT screened, no needs identified, will sign off. Per chart review, pt is total A at baseline with ADLs and mobility. Additionally, goals of care now focused on comfort. Will sign off.   Raynald Kemp, OT Acute Rehabilitation Services Pager: [phone] Office: [phone]  10/19/2020, 9:01 AM

## 2020-10-19 NOTE — Discharge Instructions (Signed)
Orthopaedic Discharge Instructions   Ok to move leg as needed to facilitate transfers No knee ROM restriction  Posterior hip precautions R hip  Daily dressing changes as needed to R thigh: mepilex dressing or 4x4's and tape  Ok to leave wound open to air once dry Keep wound covered if there are issues with incontinence, etc  Follow up with ortho in 2 weeks

## 2020-10-19 NOTE — Progress Notes (Addendum)
Civil engineer, contracting [facility]) Hospital Liaison: RN note     Notified by Transition of Care Manger Luan Pulling, RN of patient/family request for [facility] services at LTC after discharge. Chart and patient information reviewedby [facility] physician. Hospice eligibility confirmed.     Writer spoke with niece, [name] to initiate education related to hospice philosophy, services and team approach to care. [name]  verbalized understanding of information given.    Please send signed and completed DNR form to LTC  with patient. Patient will need prescriptions for discharge comfort medications.       [facility] aware of the above. Please notify [facility] when patient is ready to leave the unit at discharge. (Call [phone] or [phone] after 5pm.) [facility] information and contact numbers given to  [facility].       Please call with any hospice related questions.      Thank you for this referral.     Elsie Saas, RN, [facility] (listed on AMION under Hospice [facility])   [phone]

## 2020-10-19 NOTE — [place] FL2 (Signed)
[place] MEDICAID FL2 LEVEL OF CARE SCREENING TOOL     IDENTIFICATION  Patient Name: Kara Moore Birthdate: 05/05/1935 Sex: female Admission Date (Current Location): 10/15/2020  [facility] and [place] Number:  Producer, television/film/video and Address:  [facility] [place], [address] [place], [place] [address]      Provider Number: [id number]  Attending Physician Name and Address:  Coralie Keens,*  Relative Name and Phone Number:  Alanda Slim - [phone]    Current Level of Care: Hospital Recommended Level of Care: Skilled Nursing Facility Prior Approval Number:    Date Approved/Denied:   PASRR Number: [id number]  Discharge Plan: SNF    Current Diagnoses: Patient Active Problem List   Diagnosis Date Noted  . Acute metabolic encephalopathy 10/18/2020  . Femur fracture, right (HCC) 10/15/2020  . Pressure injury of skin 06/29/2017  . Closed left hip fracture (HCC) 06/28/2017  . Hip fracture, unspecified laterality, closed, initial encounter (HCC) 05/11/2017  . Hip fracture (HCC) 05/11/2017  . Mixed incontinence urge and stress 12/12/2016  . Primary osteoarthritis of both knees 07/21/2014  . Essential hypertension, benign 07/21/2014  . Other and unspecified hyperlipidemia 07/21/2014  . Osteoarthritis of right knee 01/31/2014  . Diabetes mellitus type 2, uncomplicated (HCC) 01/17/2014  . Right calf pain 01/17/2014  . Parkinson's disease (tremor, stiffness, slow motion, unstable posture) (HCC) 10/19/2013  . Memory loss   . Hypothyroidism   . Macular degeneration   . Senile osteoporosis   . Hyperlipidemia   . Recurrent falls 06/17/2013    Orientation RESPIRATION BLADDER Height & Weight     Place  Normal External catheter Weight:   Height:     BEHAVIORAL SYMPTOMS/MOOD NEUROLOGICAL BOWEL NUTRITION STATUS      Incontinent Diet (See Discharge Summary)  AMBULATORY STATUS COMMUNICATION OF NEEDS Skin   Total Care Verbally Surgical wounds,  Bruising (Right femur dressing - surgical incision)                       Personal Care Assistance Level of Assistance  Bathing, Feeding, Dressing, Total care Bathing Assistance: Maximum assistance Feeding assistance: Maximum assistance Dressing Assistance: Maximum assistance Total Care Assistance: Maximum assistance   Functional Limitations Info  Sight, Hearing, Speech Sight Info: Adequate Hearing Info: Adequate Speech Info: Adequate    SPECIAL CARE FACTORS FREQUENCY                       Contractures Contractures Info: Not present    Additional Factors Info  Code Status, Allergies, Psychotropic, Insulin Sliding Scale Code Status Info: DNR Allergies Info: NKDA Psychotropic Info: Sinemet, remeron Insulin Sliding Scale Info: See discharge summary       Current Medications (10/19/2020):  This is the current hospital active medication list Current Facility-Administered Medications  Medication Dose Route Frequency Provider Last Rate Last Admin  . 0.9 %  sodium chloride infusion (Manually program via Guardrails IV Fluids)   Intravenous Once Manuela Schwartz, NP      . acetaminophen (TYLENOL) suppository 650 mg  650 mg Rectal Q6H PRN Candelaria Stagers T, MD   650 mg at 10/18/20 0644   Or  . acetaminophen (TYLENOL) tablet 650 mg  650 mg Oral Q4H PRN Almon Hercules, MD      . alum & mag hydroxide-simeth (MAALOX/MYLANTA) 200-200-20 MG/5ML suspension 30 mL  30 mL Oral Q6H PRN Montez Morita, PA-C      . carbidopa-levodopa (SINEMET IR) 25-100  MG per tablet immediate release 1 tablet  1 tablet Oral 4 times per day Montez Morita, PA-C   1 tablet at 10/19/20 1157  . Chlorhexidine Gluconate Cloth 2 % PADS 6 each  6 each Topical Daily Montez Morita, PA-C   6 each at 10/19/20 1031  . cholecalciferol (VITAMIN D3) tablet 2,000 Units  2,000 Units Oral BID Montez Morita, PA-C   2,000 Units at 10/17/20 2228  . docusate sodium (COLACE) capsule 100 mg  100 mg Oral BID PRN Montez Morita, PA-C      .  enoxaparin (LOVENOX) injection 40 mg  40 mg Subcutaneous Q24H Arrien, York Ram, MD      . insulin aspart (novoLOG) injection 0-15 Units  0-15 Units Subcutaneous TID WC Montez Morita, PA-C   2 Units at 10/18/20 1221  . lactated ringers infusion   Intravenous Continuous Montez Morita, PA-C 50 mL/hr at 10/18/20 1912 New Bag at 10/18/20 1912  . levothyroxine (SYNTHROID) tablet 50 mcg  50 mcg Oral Q0600 Montez Morita, PA-C   50 mcg at 10/17/20 2229  . magnesium hydroxide (MILK OF MAGNESIA) suspension 15 mL  15 mL Oral Daily PRN Montez Morita, PA-C      . mirtazapine (REMERON) tablet 7.5 mg  7.5 mg Oral QHS Montez Morita, PA-C   7.5 mg at 10/17/20 2229  . multivitamin with minerals tablet 1 tablet  1 tablet Oral Daily Montez Morita, PA-C   1 tablet at 10/17/20 7989  . ondansetron (ZOFRAN) injection 4 mg  4 mg Intravenous Q8H PRN Almon Hercules, MD      . Vitamin D (Ergocalciferol) (DRISDOL) capsule 50,000 Units  50,000 Units Oral Q7 days Montez Morita, PA-C   50,000 Units at 10/16/20 2245     Discharge Medications: Please see discharge summary for a list of discharge medications.  Relevant Imaging Results:  Relevant Lab Results:   Additional Information SS# [id number]  Janae Bridgeman, RN

## 2020-10-19 NOTE — Discharge Summary (Addendum)
Physician Discharge Summary  Kara Moore:[id number] DOB: 1935/07/01 DOA: 10/15/2020  PCP: Karie Schwalbe, MD  Admit date: 10/15/2020 Discharge date: 10/19/2020  Admitted From: SNF  Disposition:  SNF   Recommendations for Outpatient Follow-up and new medication changes:  1. Follow up with Dr. Alphonsus Sias in 7 days.  2. Patient will be discharge with hospice services.  3. Follow up with Orthopedics in 2 weeks. 4. Ok to dc sutures on 11/01/2020. 5. Increase vitamin D regimen.   I spoke with patient's nice at the bedside, we talked in detail about patient's condition, plan of care and prognosis and all questions were addressed.   Home Health: na  Equipment/Devices: na    Discharge Condition: stable  CODE STATUS: DNR   Diet recommendation:    Diet recommendations:  (thin liquids) Liquids provided via: Straw Supervision: Trained caregiver to feed patient Postural Changes and/or Swallow Maneuvers: Seated upright 90 degrees    Brief/Interim Summary: Kara Moore was admitted to the hospital with a working diagnosis of right femoral periprosthetic fracture.  85 year old female with past medical history of hypertension, dyslipidemia, type 2 diabetes mellitus, Parkinson's disease and Lewy body dementia who presents with altered mentation.  Apparently she had a fall the day prior to admission that will not require further evaluation.  The following day patient was noted to be confused, more than usual, not at her baseline.  On her initial physical examination her temperature was 97.8, blood pressure 101/50, heart rate 89, respiratory rate 18.  While positioning for a straight cath  there was a popping sound and immediate malrotation of the right leg.  Her lungs were clear to auscultation, heart S1-S2, present, frequent PVCs, soft 2/6 systolic murmur, abdomen soft, lower extremities with no edema, she had cogwheel rigidity. Sodium 139, potassium 3.9, chloride 102, bicarb 25, glucose  191, BUN 15, creatinine 0.64, white count 9.2, hemoglobin 9.9, hematocrit 30.5, platelets 315. SARS COVID-19 was negative.  Urinalysis specific gravity 1.018, 11-21 white cells. CT head and cervical spine no acute changes.  Her chest radiograph was negative for infiltrates. EKG 89 bpm, normal axis, normal intervals, sinus rhythm with positive PVCs, no ST segment or T wave changes, noisy baseline.  Pelvic radiograph with prior right total hip arthroplasty with an acute, displaced periprosthetic fracture of the subtrochanteric aspect of the proximal right femur.  Positive osteopenia.   Patient initially evaluated at [facility], transferred to [facility] for further orthopedic intervention.  Patient underwent open reduction internal fixation right femur shaft fracture on 11/29.   Patient very deconditioned, advance neurodegenerative disease, very poor prognosis. Palliative care team was consulted and patient will be discharge under hospice care.   1.  Acute metabolic encephalopathy/Parkinson's disease/Lewy body dementia.  Patient was admitted to the medical ward, she received supportive medical therapy including intravenous fluids, analgesics, she was evaluated by physical therapy, occupational therapy and speech therapy.  By the time of her discharge she responds to simple questions and follows simple commands.  Her prognosis is poor related to her advanced neurodegenerative disease.  Patient has been seen by palliative care and decision was made to continue care under hospice. Continue mirtazapine and Sinemet.  2.  Acute urinary tract infection, due to E. Coli.  Patient received 3 days of ceftriaxone, her urine culture on 11/28 had greater than 100,000 colony-forming units of E. coli, repeat culture on 11/29 was no growth.  3.  Type 2 diabetes mellitus.  Her glucose remained stable, she received insulin sliding scale.  4.  Hypothyroidism.  Continue levothyroxine.  5.  Acute kidney injury.   Likely prerenal, she received supportive medical therapy including intravenous fluids, her discharge creatinine is 0.73, sodium 144, potassium 3.4, chloride 109, bicarb 21, glucose 117, BUN 14.  6.  Chronic anemia.  Her hemoglobin and hematocrit remained stable.  7.  Hypertension.  Her blood pressure remained stable without antihypertensive medications.   Discharge Diagnoses:  Principal Problem:   Acute metabolic encephalopathy Active Problems:   Memory loss   Hypothyroidism   Parkinson's disease (tremor, stiffness, slow motion, unstable posture) (HCC)   Diabetes mellitus type 2, uncomplicated (HCC)   Femur fracture, right (HCC)    Discharge Instructions   Allergies as of 10/19/2020   No Known Allergies     Medication List    STOP taking these medications   Cholecalciferol 50 MCG (2000 UT) Caps Replaced by: Vitamin D3 25 MCG tablet   dimethicone-zinc oxide cream   enoxaparin 40 MG/0.4ML injection Commonly known as: LOVENOX   furosemide 20 MG tablet Commonly known as: LASIX   glipiZIDE 5 MG tablet Commonly known as: GLUCOTROL   guaifenesin 100 MG/5ML syrup Commonly known as: ROBITUSSIN   lisinopril 5 MG tablet Commonly known as: ZESTRIL   magnesium hydroxide 400 MG/5ML suspension Commonly known as: MILK OF MAGNESIA   memantine 5 MG tablet Commonly known as: Namenda   metFORMIN 500 MG tablet Commonly known as: GLUCOPHAGE   oxyCODONE 5 MG immediate release tablet Commonly known as: Oxy IR/ROXICODONE   pantoprazole 40 MG tablet Commonly known as: PROTONIX   potassium chloride 10 MEQ tablet Commonly known as: KLOR-CON   pravastatin 80 MG tablet Commonly known as: PRAVACHOL   ranitidine 150 MG capsule Commonly known as: ZANTAC   traMADol 50 MG tablet Commonly known as: ULTRAM     TAKE these medications   acetaminophen 325 MG tablet Commonly known as: TYLENOL Take 2 tablets (650 mg total) by mouth every 4 (four) hours as needed for mild pain,  fever or headache. What changed:   how much to take  when to take this  reasons to take this   calcium carbonate 1250 (500 Ca) MG tablet Commonly known as: OS-CAL - dosed in mg of elemental calcium Take 1 tablet by mouth daily.   carbidopa-levodopa 25-100 MG tablet Commonly known as: SINEMET IR Take 1 tablet by mouth 4 (four) times daily. Take at 8, 12, 4 PM, 8 PM.   docusate sodium 100 MG capsule Commonly known as: COLACE Take 1 capsule (100 mg total) by mouth 2 (two) times daily.   fluticasone 50 MCG/ACT nasal spray Commonly known as: FLONASE Place 2 sprays into both nostrils as needed for allergies.   Glucerna Liqd Take 237 mLs by mouth 2 (two) times daily between meals.   hydroxypropyl methylcellulose / hypromellose 2.5 % ophthalmic solution Commonly known as: ISOPTO TEARS / GONIOVISC Place 1 drop into both eyes as needed for dry eyes.   ICaps MV Tabs Take 2 tablets by mouth daily. What changed: Another medication with the same name was removed. Continue taking this medication, and follow the directions you see here.   levothyroxine 50 MCG tablet Commonly known as: SYNTHROID TAKE 1 TABLET BY MOUTH DAILY BEFORE BREAKFAST ON EMPTY STOMACH *BINGO CARD* What changed: See the new instructions.   Mintox 200-200-20 MG/5ML suspension Generic drug: alum & mag hydroxide-simeth Take 30 mLs by mouth every 6 (six) hours as needed for indigestion or heartburn.   mirtazapine 15 MG tablet Commonly  known as: REMERON Take 15 mg by mouth at bedtime. What changed: Another medication with the same name was removed. Continue taking this medication, and follow the directions you see here.   sennosides-docusate sodium 8.6-50 MG tablet Commonly known as: SENOKOT-S Take 2 tablets by mouth in the morning and at bedtime.   Vitamin D (Ergocalciferol) 1.25 MG (50000 UNIT) Caps capsule Commonly known as: DRISDOL Take 1 capsule (50,000 Units total) by mouth every 7 (seven) days. Start  taking on: October 23, 2020 What changed:   when to take this  additional instructions   Vitamin D3 25 MCG tablet Commonly known as: Vitamin D Take 2 tablets (2,000 Units total) by mouth 2 (two) times daily. Replaces: Cholecalciferol 50 MCG (2000 UT) Caps       Follow-up Information    [facility] Follow up.   Specialty: PALLIATIVE CARE Why: [facility] was consulted and they will be following up with hospice care services at the [facility] facility. Contact information: [address] [place] [address] [phone]             No Known Allergies  Consultations:  Orthopedics  Palliative Care    Procedures/Studies: DG Pelvis 1-2 Views  Result Date: 10/15/2020 CLINICAL DATA:  Fall EXAM: RIGHT FEMUR 2 VIEWS; PELVIS - 1-2 VIEW COMPARISON:  05/11/2017 FINDINGS: Patient is status post right total hip arthroplasty. There is an acute periprosthetic spiral fracture of the subtrochanteric aspect of the proximal right femur. Fracture is medially displaced by up to 1 shaft width. Slight posterior displacement. Osseous structures are markedly demineralized. Partially visualized left hip ORIF hardware. Advanced degenerative changes of the right knee. Prominent vascular calcifications. IMPRESSION: 1. Prior right total hip arthroplasty with an acute, displaced periprosthetic fracture of the subtrochanteric aspect of the proximal right femur. 2. Osteopenia. Electronically Signed   By: Duanne Guess D.O.   On: 10/15/2020 12:51   CT Head Wo Contrast  Result Date: 10/15/2020 CLINICAL DATA:  Head trauma. EXAM: CT HEAD WITHOUT CONTRAST TECHNIQUE: Contiguous axial images were obtained from the base of the skull through the vertex without intravenous contrast. COMPARISON:  May 11, 2017 FINDINGS: Brain: No evidence of acute infarction, hemorrhage, hydrocephalus or mass lesion/mass effect. Mild left frontal hygroma. Vascular: Calcific atherosclerotic disease of the  intra cavernous carotid arteries. Skull: Normal. Negative for fracture or focal lesion. Sinuses/Orbits: No acute finding. Other: None. IMPRESSION: 1. No acute intracranial abnormality. 2. Mild left frontal hygroma. Electronically Signed   By: Ted Mcalpine M.D.   On: 10/15/2020 13:16   CT Cervical Spine Wo Contrast  Result Date: 10/15/2020 CLINICAL DATA:  Status post fall. EXAM: CT CERVICAL SPINE WITHOUT CONTRAST TECHNIQUE: Multidetector CT imaging of the cervical spine was performed without intravenous contrast. Multiplanar CT image reconstructions were also generated. COMPARISON:  None. FINDINGS: Alignment: Normal. Skull base and vertebrae: No acute fracture. No primary bone lesion or focal pathologic process. Soft tissues and spinal canal: No prevertebral fluid or swelling. No visible canal hematoma. Disc levels:  Mild multilevel osteoarthritic changes. Upper chest: Negative. Other: None. IMPRESSION: 1. No evidence of acute traumatic injury to the cervical spine. 2. Mild multilevel osteoarthritic changes of the cervical spine. Electronically Signed   By: Ted Mcalpine M.D.   On: 10/15/2020 13:20   DG Chest Port 1 View  Result Date: 10/17/2020 CLINICAL DATA:  Recent femur fracture, currently with nausea and vomiting EXAM: PORTABLE CHEST 1 VIEW COMPARISON:  October 16, 2020 FINDINGS: There is slight bibasilar atelectasis. Lungs otherwise  are clear. Heart is mildly enlarged with pulmonary vascularity normal. No adenopathy. Bones are osteoporotic. IMPRESSION: Bibasilar atelectasis. Lungs otherwise clear. Stable cardiac prominence. Osteoporosis. Electronically Signed   By: Bretta Bang III M.D.   On: 10/17/2020 10:22   Chest Portable 1 View  Result Date: 10/16/2020 CLINICAL DATA:  Femur fracture. EXAM: PORTABLE CHEST 1 VIEW COMPARISON:  June 28, 2017. FINDINGS: Stable cardiomediastinal silhouette. Both lungs are clear. Old right rib fractures are noted. IMPRESSION: No active disease.  Electronically Signed   By: Lupita Raider M.D.   On: 10/16/2020 08:11   DG Abd Portable 1V  Result Date: 10/17/2020 CLINICAL DATA:  Nausea and vomiting EXAM: PORTABLE ABDOMEN - 1 VIEW COMPARISON:  None. FINDINGS: Moderate stool throughout colon. No bowel dilatation or air-fluid level to suggest bowel obstruction. No free air. Total hip replacement right. Postoperative change in visualized proximal left femur. IMPRESSION: No bowel obstruction or free air. Moderate stool in colon. Postoperative change each proximal femur region. Electronically Signed   By: Bretta Bang III M.D.   On: 10/17/2020 10:23   DG C-Arm 1-60 Min  Result Date: 10/16/2020 CLINICAL DATA:  Provided history: Surgery, elective. Distal femur ORIF. Provided fluoroscopy time 56 seconds. EXAM: RIGHT FEMUR 2 VIEWS; DG C-ARM 1-60 MIN COMPARISON:  Radiographs of the right femur and pelvis 10/15/2020. FINDINGS: Six intraoperative fluoroscopic images of the right femur are submitted from ORIF of a proximal right femoral fracture. There has been interval ORIF of the previously demonstrated periprosthetic subtrochanteric proximal right femur fracture utilizing a lateral plate and multiple screws. Prior right hip total arthroplasty. Skin staples project in the region of the right groin. IMPRESSION: Six intraoperative fluoroscopic images from ORIF of an a periprosthetic proximal right femur fracture, as described. Electronically Signed   By: Jackey Loge DO   On: 10/16/2020 17:59   DG FEMUR, MIN 2 VIEWS RIGHT  Result Date: 10/16/2020 CLINICAL DATA:  Provided history: Surgery, elective. Distal femur ORIF. Provided fluoroscopy time 56 seconds. EXAM: RIGHT FEMUR 2 VIEWS; DG C-ARM 1-60 MIN COMPARISON:  Radiographs of the right femur and pelvis 10/15/2020. FINDINGS: Six intraoperative fluoroscopic images of the right femur are submitted from ORIF of a proximal right femoral fracture. There has been interval ORIF of the previously demonstrated  periprosthetic subtrochanteric proximal right femur fracture utilizing a lateral plate and multiple screws. Prior right hip total arthroplasty. Skin staples project in the region of the right groin. IMPRESSION: Six intraoperative fluoroscopic images from ORIF of an a periprosthetic proximal right femur fracture, as described. Electronically Signed   By: Jackey Loge DO   On: 10/16/2020 17:59   DG Femur Min 2 Views Right  Result Date: 10/15/2020 CLINICAL DATA:  Fall EXAM: RIGHT FEMUR 2 VIEWS; PELVIS - 1-2 VIEW COMPARISON:  05/11/2017 FINDINGS: Patient is status post right total hip arthroplasty. There is an acute periprosthetic spiral fracture of the subtrochanteric aspect of the proximal right femur. Fracture is medially displaced by up to 1 shaft width. Slight posterior displacement. Osseous structures are markedly demineralized. Partially visualized left hip ORIF hardware. Advanced degenerative changes of the right knee. Prominent vascular calcifications. IMPRESSION: 1. Prior right total hip arthroplasty with an acute, displaced periprosthetic fracture of the subtrochanteric aspect of the proximal right femur. 2. Osteopenia. Electronically Signed   By: Duanne Guess D.O.   On: 10/15/2020 12:51   DG FEMUR PORT, MIN 2 VIEWS RIGHT  Result Date: 10/16/2020 CLINICAL DATA:  Fracture status post ORIF EXAM: RIGHT FEMUR PORTABLE 2  VIEW COMPARISON:  10/15/2020 FINDINGS: Frontal and lateral views of the right femur demonstrate lateral plate and screw fixation traversing the oblique periprosthetic femoral diaphyseal fracture seen previously. Alignment is anatomic. Postsurgical changes are seen within the soft tissues. IMPRESSION: 1. ORIF of a periprosthetic right femoral diaphyseal fracture as above. Electronically Signed   By: Sharlet SalinaMichael  Brown M.D.   On: 10/16/2020 20:29      Procedures: open reduction internal fixation right femur shaft fracture on 11/29.    Subjective: Patient not much interactive, no  apparent nausea, vomiting, dyspnea or chest pain.   Discharge Exam: Vitals:   10/19/20 0327 10/19/20 0749  BP: (!) 136/99 140/84  Pulse: 78 79  Resp: 16 14  Temp: 99.1 F (37.3 C) 99.5 F (37.5 C)  SpO2: 100% 99%   Vitals:   10/18/20 1537 10/18/20 2009 10/19/20 0327 10/19/20 0749  BP: 130/78 (!) 148/86 (!) 136/99 140/84  Pulse:  87 78 79  Resp:  15 16 14   Temp:  98.9 F (37.2 C) 99.1 F (37.3 C) 99.5 F (37.5 C)  TempSrc:  Oral Oral Oral  SpO2:  98% 100% 99%    General: Not in pain or dyspnea. Deconditioned  Neurology: Awake, today not interactive.  E ENT: no pallor, no icterus, oral mucosa moist Cardiovascular: No JVD. S1-S2 present, rhythmic, no gallops, rubs, or murmurs. No lower extremity edema. Pulmonary: positive breath sounds bilaterally,with no wheezing, rhonchi or rales. Gastrointestinal. Abdomen soft and non tender Skin. No rashes Musculoskeletal: no joint deformities   The results of significant diagnostics from this hospitalization (including imaging, microbiology, ancillary and laboratory) are listed below for reference.     Microbiology: Recent Results (from the past 240 hour(s))  Urine culture     Status: Abnormal   Collection Time: 10/15/20 11:58 AM   Specimen: Urine, Random  Result Value Ref Range Status   Specimen Description   Final    URINE, RANDOM Performed at [facility], [address] [place], [place] [address]    Special Requests   Final    NONE Performed at [facility], [address] [place], [place] [address]    Culture >=100,000 COLONIES/mL ESCHERICHIA COLI (A)  Final   Report Status 10/17/2020 FINAL  Final   Organism ID, Bacteria ESCHERICHIA COLI (A)  Final      Susceptibility   Escherichia coli - MIC*    AMPICILLIN <=2 SENSITIVE Sensitive     CEFAZOLIN <=4 SENSITIVE Sensitive     CEFEPIME <=0.12 SENSITIVE Sensitive     CEFTRIAXONE <=0.25 SENSITIVE Sensitive     CIPROFLOXACIN <=0.25 SENSITIVE Sensitive      GENTAMICIN <=1 SENSITIVE Sensitive     IMIPENEM <=0.25 SENSITIVE Sensitive     NITROFURANTOIN <=16 SENSITIVE Sensitive     TRIMETH/SULFA <=20 SENSITIVE Sensitive     AMPICILLIN/SULBACTAM <=2 SENSITIVE Sensitive     PIP/TAZO <=4 SENSITIVE Sensitive     * >=100,000 COLONIES/mL ESCHERICHIA COLI  Resp Panel by RT-PCR (Flu A&B, Covid) Nasopharyngeal Swab     Status: None   Collection Time: 10/15/20 12:18 PM   Specimen: Nasopharyngeal Swab; Nasopharyngeal(NP) swabs in vial transport medium  Result Value Ref Range Status   SARS Coronavirus 2 by RT PCR NEGATIVE NEGATIVE Final    Comment: (NOTE) SARS-CoV-2 target nucleic acids are NOT DETECTED.  The SARS-CoV-2 RNA is generally detectable in upper respiratory specimens during the acute phase of infection. The lowest concentration of SARS-CoV-2 viral copies this assay can detect is 138 copies/mL.  A negative result does not preclude SARS-Cov-2 infection and should not be used as the sole basis for treatment or other patient management decisions. A negative result may occur with  improper specimen collection/handling, submission of specimen other than nasopharyngeal swab, presence of viral mutation(s) within the areas targeted by this assay, and inadequate number of viral copies(<138 copies/mL). A negative result must be combined with clinical observations, patient history, and epidemiological information. The expected result is Negative.  Fact Sheet for Patients:  BloggerCourse.com  Fact Sheet for Healthcare Providers:  SeriousBroker.it  This test is no t yet approved or cleared by the [place] FDA and  has been authorized for detection and/or diagnosis of SARS-CoV-2 by FDA under an Emergency Use Authorization (EUA). This EUA will remain  in effect (meaning this test can be used) for the duration of the COVID-19 declaration under Section 564(b)(1) of the Act, 21 U.S.C.section  360bbb-3(b)(1), unless the authorization is terminated  or revoked sooner.       Influenza A by PCR NEGATIVE NEGATIVE Final   Influenza B by PCR NEGATIVE NEGATIVE Final    Comment: (NOTE) The Xpert Xpress SARS-CoV-2/FLU/RSV plus assay is intended as an aid in the diagnosis of influenza from Nasopharyngeal swab specimens and should not be used as a sole basis for treatment. Nasal washings and aspirates are unacceptable for Xpert Xpress SARS-CoV-2/FLU/RSV testing.  Fact Sheet for Patients: BloggerCourse.com  Fact Sheet for Healthcare Providers: SeriousBroker.it  This test is not yet approved or cleared by the [place] FDA and has been authorized for detection and/or diagnosis of SARS-CoV-2 by FDA under an Emergency Use Authorization (EUA). This EUA will remain in effect (meaning this test can be used) for the duration of the COVID-19 declaration under Section 564(b)(1) of the Act, 21 U.S.C. section 360bbb-3(b)(1), unless the authorization is terminated or revoked.  Performed at [facility], [address] [place], [place] [address]   Surgical pcr screen     Status: None   Collection Time: 10/16/20 12:46 AM   Specimen: Nasal Mucosa; Nasal Swab  Result Value Ref Range Status   MRSA, PCR NEGATIVE NEGATIVE Final   Staphylococcus aureus NEGATIVE NEGATIVE Final    Comment: (NOTE) The Xpert SA Assay (FDA approved for NASAL specimens in patients 21 years of age and older), is one component of a comprehensive surveillance program. It is not intended to diagnose infection nor to guide or monitor treatment. Performed at [facility], [address] [place], [place] [address]   Culture, Urine     Status: None   Collection Time: 10/16/20 11:32 AM   Specimen: Urine, Catheterized  Result Value Ref Range Status   Specimen Description URINE, CATHETERIZED  Final   Special Requests NONE  Final   Culture   Final     NO GROWTH Performed at [facility], [address], [place], [place] [address]    Report Status 10/17/2020 FINAL  Final     Labs: BNP (last 3 results) No results for input(s): BNP in the last 8760 hours. Basic Metabolic Panel: Recent Labs  Lab 10/15/20 1312 10/16/20 0416 10/17/20 0501 10/18/20 0143 10/19/20 0242  NA 139 138 138 140 144  K 3.9 4.3 4.2 3.7 3.4*  CL 102 101 102 105 109  CO2 21*  GLUCOSE 191* 176* 162* 165* 117*  BUN CREATININE 0.64 1.11* 0.99 0.90 0.73  CALCIUM 9.1 9.0 8.3* 8.4* 8.5*  MG  --   --  1.5* 2.1 1.8  PHOS  --   --  3.4  --   --    Liver Function Tests: Recent Labs  Lab 10/15/20 1312 10/17/20 0501 10/18/20 0143  AST 23 26 35  ALT 15 <5 6  ALKPHOS 79 52 52  BILITOT 0.5 0.7 1.1  PROT 6.8 5.0* 5.1*  ALBUMIN 3.5 2.7* 2.4*   No results for input(s): LIPASE, AMYLASE in the last 168 hours. Recent Labs  Lab 10/18/20 0145  AMMONIA 44*   CBC: Recent Labs  Lab 10/15/20 1158 10/15/20 1158 10/16/20 0416 10/17/20 0501 10/17/20 0922 10/18/20 0143 10/18/20 1131  WBC 7.6  --  9.2 8.6 11.5* 11.0*  --   NEUTROABS 5.1  --   --   --   --   --   --   HGB 12.8   < > 9.9* 7.4* 7.2* 6.9* 8.1*  HCT 37.9   < > 30.5* 23.0* 22.9* 20.9* 25.4*  MCV 94.5  --  95.6 97.5 97.9 96.8  --   PLT 401*  --  315 237 232 222  --    < > = values in this interval not displayed.   Cardiac Enzymes: No results for input(s): CKTOTAL, CKMB, CKMBINDEX, TROPONINI in the last 168 hours. BNP: Invalid input(s): POCBNP CBG: Recent Labs  Lab 10/18/20 1120 10/18/20 1557 10/18/20 2009 10/19/20 0636 10/19/20 1112  GLUCAP 125* 95 104* 111* 119*   D-Dimer No results for input(s): DDIMER in the last 72 hours. Hgb A1c No results for input(s): HGBA1C in the last 72 hours. Lipid Profile No results for input(s): CHOL, HDL, LDLCALC, TRIG, CHOLHDL, LDLDIRECT in the last 72 hours. Thyroid function studies Recent Labs    10/18/20 0145  TSH  1.582   Anemia work up Recent Labs    10/18/20 0143 10/18/20 0145  VITAMINB12 481  --   FOLATE 22.5  --   FERRITIN 65  --   TIBC 202*  --   IRON 12*  --   RETICCTPCT  --  2.1   Urinalysis    Component Value Date/Time   COLORURINE YELLOW (A) 10/15/2020 1158   APPEARANCEUR HAZY (A) 10/15/2020 1158   APPEARANCEUR Cloudy 11/12/2014 0232   LABSPEC 1.018 10/15/2020 1158   LABSPEC 1.017 11/12/2014 0232   PHURINE 6.0 10/15/2020 1158   GLUCOSEU NEGATIVE 10/15/2020 1158   GLUCOSEU Negative 11/12/2014 0232   HGBUR NEGATIVE 10/15/2020 1158   BILIRUBINUR NEGATIVE 10/15/2020 1158   BILIRUBINUR Negative 11/12/2014 0232   KETONESUR 20 (A) 10/15/2020 1158   PROTEINUR NEGATIVE 10/15/2020 1158   NITRITE POSITIVE (A) 10/15/2020 1158   LEUKOCYTESUR NEGATIVE 10/15/2020 1158   LEUKOCYTESUR Negative 11/12/2014 0232   Sepsis Labs Invalid input(s): PROCALCITONIN,  WBC,  LACTICIDVEN Microbiology Recent Results (from the past 240 hour(s))  Urine culture     Status: Abnormal   Collection Time: 10/15/20 11:58 AM   Specimen: Urine, Random  Result Value Ref Range Status   Specimen Description   Final    URINE, RANDOM Performed at [facility], [address], [place], [place] [address]    Special Requests   Final    NONE Performed at [facility], [address] [place], [place] [address]    Culture >=100,000 COLONIES/mL ESCHERICHIA COLI (A)  Final   Report Status 10/17/2020 FINAL  Final   Organism ID, Bacteria ESCHERICHIA COLI (A)  Final      Susceptibility   Escherichia coli -  MIC*    AMPICILLIN <=2 SENSITIVE Sensitive     CEFAZOLIN <=4 SENSITIVE Sensitive     CEFEPIME <=0.12 SENSITIVE Sensitive     CEFTRIAXONE <=0.25 SENSITIVE Sensitive     CIPROFLOXACIN <=0.25 SENSITIVE Sensitive     GENTAMICIN <=1 SENSITIVE Sensitive     IMIPENEM <=0.25 SENSITIVE Sensitive     NITROFURANTOIN <=16 SENSITIVE Sensitive     TRIMETH/SULFA <=20 SENSITIVE Sensitive      AMPICILLIN/SULBACTAM <=2 SENSITIVE Sensitive     PIP/TAZO <=4 SENSITIVE Sensitive     * >=100,000 COLONIES/mL ESCHERICHIA COLI  Resp Panel by RT-PCR (Flu A&B, Covid) Nasopharyngeal Swab     Status: None   Collection Time: 10/15/20 12:18 PM   Specimen: Nasopharyngeal Swab; Nasopharyngeal(NP) swabs in vial transport medium  Result Value Ref Range Status   SARS Coronavirus 2 by RT PCR NEGATIVE NEGATIVE Final    Comment: (NOTE) SARS-CoV-2 target nucleic acids are NOT DETECTED.  The SARS-CoV-2 RNA is generally detectable in upper respiratory specimens during the acute phase of infection. The lowest concentration of SARS-CoV-2 viral copies this assay can detect is 138 copies/mL. A negative result does not preclude SARS-Cov-2 infection and should not be used as the sole basis for treatment or other patient management decisions. A negative result may occur with  improper specimen collection/handling, submission of specimen other than nasopharyngeal swab, presence of viral mutation(s) within the areas targeted by this assay, and inadequate number of viral copies(<138 copies/mL). A negative result must be combined with clinical observations, patient history, and epidemiological information. The expected result is Negative.  Fact Sheet for Patients:  BloggerCourse.com  Fact Sheet for Healthcare Providers:  SeriousBroker.it  This test is no t yet approved or cleared by the [place] FDA and  has been authorized for detection and/or diagnosis of SARS-CoV-2 by FDA under an Emergency Use Authorization (EUA). This EUA will remain  in effect (meaning this test can be used) for the duration of the COVID-19 declaration under Section 564(b)(1) of the Act, 21 U.S.C.section 360bbb-3(b)(1), unless the authorization is terminated  or revoked sooner.       Influenza A by PCR NEGATIVE NEGATIVE Final   Influenza B by PCR NEGATIVE NEGATIVE Final     Comment: (NOTE) The Xpert Xpress SARS-CoV-2/FLU/RSV plus assay is intended as an aid in the diagnosis of influenza from Nasopharyngeal swab specimens and should not be used as a sole basis for treatment. Nasal washings and aspirates are unacceptable for Xpert Xpress SARS-CoV-2/FLU/RSV testing.  Fact Sheet for Patients: BloggerCourse.com  Fact Sheet for Healthcare Providers: SeriousBroker.it  This test is not yet approved or cleared by the [place] FDA and has been authorized for detection and/or diagnosis of SARS-CoV-2 by FDA under an Emergency Use Authorization (EUA). This EUA will remain in effect (meaning this test can be used) for the duration of the COVID-19 declaration under Section 564(b)(1) of the Act, 21 U.S.C. section 360bbb-3(b)(1), unless the authorization is terminated or revoked.  Performed at [facility], [address] [place], [place] [address]   Surgical pcr screen     Status: None   Collection Time: 10/16/20 12:46 AM   Specimen: Nasal Mucosa; Nasal Swab  Result Value Ref Range Status   MRSA, PCR NEGATIVE NEGATIVE Final   Staphylococcus aureus NEGATIVE NEGATIVE Final    Comment: (NOTE) The Xpert SA Assay (FDA approved for NASAL specimens in patients 43 years of age and older), is one component of a comprehensive surveillance program. It is not intended  to diagnose infection nor to guide or monitor treatment. Performed at [facility], [address], [place], [place] [address]   Culture, Urine     Status: None   Collection Time: 10/16/20 11:32 AM   Specimen: Urine, Catheterized  Result Value Ref Range Status   Specimen Description URINE, CATHETERIZED  Final   Special Requests NONE  Final   Culture   Final    NO GROWTH Performed at [facility], [address] [place], [place] [address]    Report Status 10/17/2020 FINAL  Final     Time coordinating discharge: 45  minutes  SIGNED:   Coralie Keens, MD  [facility] 10/19/2020, 11:37 AM

## 2020-10-19 NOTE — TOC Progression Note (Addendum)
Transition of Care [facility]) - Progression Note    Patient Details  Name: Kara Moore MRN: [id number] Date of Birth: [birth year]  [facility] CM/SW Contact  Janae Bridgeman, RN Phone Number: 10/19/2020, 12:08 PM  Clinical Narrative:    Case management spoke with Dr. Ella Jubilee this morning and the patient will be able to transfer back to [facility] under LTC and comfort care through [employer].  Serafina Mitchell, RNCM with [facility] was called and notified that the patient was going to transfer back to the facility this afternoon.  I called and spoke with the niece, [name], and the family is coming to visit the patient at the hospital in the next 30 minutes.  I placed the FL2 and discharged clinicals in the hub for the facility.  Once the patient's COVID screen is resulted - I will call [facility] to have the patient transferred back to the facility for care and the family is aware.  I spoke with Elsie Saas, RNCM with [facility] and she is following the patient for hospice services at the facility and is aware of patient's transfer to the facility after 3:30 pm today.  The primary RN can call report to [facility] at [phone] for Room 218. [facility] was called and arranged for 3:30 pm today.   Expected Discharge Plan: Long Term Nursing Home Barriers to Discharge: Continued Medical Work up  Expected Discharge Plan and Services Expected Discharge Plan: Long Term Nursing Home   Discharge Planning Services: CM Consult   Living arrangements for the past 2 months: Skilled Nursing Facility Expected Discharge Date: 10/19/20                                     Social Determinants of Health (SDOH) Interventions    Readmission Risk Interventions Readmission Risk Prevention Plan 10/18/2020  Transportation Screening Complete  PCP or Specialist Appt within 5-7 Days Complete  Home Care Screening Complete  Medication Review (RN CM) Complete  Some recent data  might be hidden

## 2020-10-20 DIAGNOSIS — N390 Urinary tract infection, site not specified: Secondary | ICD-10-CM | POA: Diagnosis not present

## 2020-10-20 DIAGNOSIS — R4182 Altered mental status, unspecified: Secondary | ICD-10-CM | POA: Diagnosis not present

## 2020-10-20 DIAGNOSIS — S7291XA Unspecified fracture of right femur, initial encounter for closed fracture: Secondary | ICD-10-CM | POA: Diagnosis not present

## 2020-10-26 DIAGNOSIS — E1159 Type 2 diabetes mellitus with other circulatory complications: Secondary | ICD-10-CM | POA: Diagnosis not present

## 2020-10-26 DIAGNOSIS — S7291XA Unspecified fracture of right femur, initial encounter for closed fracture: Secondary | ICD-10-CM | POA: Diagnosis not present

## 2020-10-26 DIAGNOSIS — G3183 Dementia with Lewy bodies: Secondary | ICD-10-CM | POA: Diagnosis not present

## 2020-10-26 DIAGNOSIS — E441 Mild protein-calorie malnutrition: Secondary | ICD-10-CM | POA: Diagnosis not present

## 2020-11-06 DIAGNOSIS — M8000XD Age-related osteoporosis with current pathological fracture, unspecified site, subsequent encounter for fracture with routine healing: Secondary | ICD-10-CM | POA: Diagnosis not present

## 2020-11-06 DIAGNOSIS — S9701XD Crushing injury of right ankle, subsequent encounter: Secondary | ICD-10-CM | POA: Diagnosis not present

## 2020-11-09 DIAGNOSIS — L89152 Pressure ulcer of sacral region, stage 2: Secondary | ICD-10-CM | POA: Diagnosis not present

## 2020-11-09 DIAGNOSIS — L0390 Cellulitis, unspecified: Secondary | ICD-10-CM | POA: Diagnosis not present

## 2020-11-15 DIAGNOSIS — B351 Tinea unguium: Secondary | ICD-10-CM | POA: Diagnosis not present

## 2020-11-18 DIAGNOSIS — E785 Hyperlipidemia, unspecified: Secondary | ICD-10-CM | POA: Diagnosis not present

## 2020-11-18 DIAGNOSIS — N390 Urinary tract infection, site not specified: Secondary | ICD-10-CM | POA: Diagnosis not present

## 2020-11-18 DIAGNOSIS — K219 Gastro-esophageal reflux disease without esophagitis: Secondary | ICD-10-CM | POA: Diagnosis not present

## 2020-11-18 DIAGNOSIS — J309 Allergic rhinitis, unspecified: Secondary | ICD-10-CM | POA: Diagnosis not present

## 2020-11-18 DIAGNOSIS — E119 Type 2 diabetes mellitus without complications: Secondary | ICD-10-CM | POA: Diagnosis not present

## 2020-11-18 DIAGNOSIS — G3183 Dementia with Lewy bodies: Secondary | ICD-10-CM | POA: Diagnosis not present

## 2020-11-18 DIAGNOSIS — S72101D Unspecified trochanteric fracture of right femur, subsequent encounter for closed fracture with routine healing: Secondary | ICD-10-CM | POA: Diagnosis not present

## 2020-11-18 DIAGNOSIS — F0280 Dementia in other diseases classified elsewhere without behavioral disturbance: Secondary | ICD-10-CM | POA: Diagnosis not present

## 2020-11-18 DIAGNOSIS — H3530 Unspecified macular degeneration: Secondary | ICD-10-CM | POA: Diagnosis not present

## 2020-11-18 DIAGNOSIS — I10 Essential (primary) hypertension: Secondary | ICD-10-CM | POA: Diagnosis not present

## 2020-11-18 DIAGNOSIS — E039 Hypothyroidism, unspecified: Secondary | ICD-10-CM | POA: Diagnosis not present

## 2020-11-22 DIAGNOSIS — S72101D Unspecified trochanteric fracture of right femur, subsequent encounter for closed fracture with routine healing: Secondary | ICD-10-CM | POA: Diagnosis not present

## 2020-11-22 DIAGNOSIS — E785 Hyperlipidemia, unspecified: Secondary | ICD-10-CM | POA: Diagnosis not present

## 2020-11-22 DIAGNOSIS — N390 Urinary tract infection, site not specified: Secondary | ICD-10-CM | POA: Diagnosis not present

## 2020-11-22 DIAGNOSIS — G3183 Dementia with Lewy bodies: Secondary | ICD-10-CM | POA: Diagnosis not present

## 2020-11-22 DIAGNOSIS — E119 Type 2 diabetes mellitus without complications: Secondary | ICD-10-CM | POA: Diagnosis not present

## 2020-11-22 DIAGNOSIS — F0280 Dementia in other diseases classified elsewhere without behavioral disturbance: Secondary | ICD-10-CM | POA: Diagnosis not present

## 2020-11-23 DIAGNOSIS — F0280 Dementia in other diseases classified elsewhere without behavioral disturbance: Secondary | ICD-10-CM | POA: Diagnosis not present

## 2020-11-23 DIAGNOSIS — E785 Hyperlipidemia, unspecified: Secondary | ICD-10-CM | POA: Diagnosis not present

## 2020-11-23 DIAGNOSIS — S72101D Unspecified trochanteric fracture of right femur, subsequent encounter for closed fracture with routine healing: Secondary | ICD-10-CM | POA: Diagnosis not present

## 2020-11-23 DIAGNOSIS — N390 Urinary tract infection, site not specified: Secondary | ICD-10-CM | POA: Diagnosis not present

## 2020-11-23 DIAGNOSIS — G3183 Dementia with Lewy bodies: Secondary | ICD-10-CM | POA: Diagnosis not present

## 2020-11-23 DIAGNOSIS — E119 Type 2 diabetes mellitus without complications: Secondary | ICD-10-CM | POA: Diagnosis not present

## 2020-11-28 DIAGNOSIS — G3183 Dementia with Lewy bodies: Secondary | ICD-10-CM | POA: Diagnosis not present

## 2020-11-28 DIAGNOSIS — E785 Hyperlipidemia, unspecified: Secondary | ICD-10-CM | POA: Diagnosis not present

## 2020-11-28 DIAGNOSIS — S72101D Unspecified trochanteric fracture of right femur, subsequent encounter for closed fracture with routine healing: Secondary | ICD-10-CM | POA: Diagnosis not present

## 2020-11-28 DIAGNOSIS — F0280 Dementia in other diseases classified elsewhere without behavioral disturbance: Secondary | ICD-10-CM | POA: Diagnosis not present

## 2020-11-28 DIAGNOSIS — N390 Urinary tract infection, site not specified: Secondary | ICD-10-CM | POA: Diagnosis not present

## 2020-11-28 DIAGNOSIS — E119 Type 2 diabetes mellitus without complications: Secondary | ICD-10-CM | POA: Diagnosis not present

## 2020-11-29 DIAGNOSIS — E119 Type 2 diabetes mellitus without complications: Secondary | ICD-10-CM | POA: Diagnosis not present

## 2020-11-29 DIAGNOSIS — E785 Hyperlipidemia, unspecified: Secondary | ICD-10-CM | POA: Diagnosis not present

## 2020-11-29 DIAGNOSIS — S72101D Unspecified trochanteric fracture of right femur, subsequent encounter for closed fracture with routine healing: Secondary | ICD-10-CM | POA: Diagnosis not present

## 2020-11-29 DIAGNOSIS — G3183 Dementia with Lewy bodies: Secondary | ICD-10-CM | POA: Diagnosis not present

## 2020-11-29 DIAGNOSIS — F0280 Dementia in other diseases classified elsewhere without behavioral disturbance: Secondary | ICD-10-CM | POA: Diagnosis not present

## 2020-11-29 DIAGNOSIS — N390 Urinary tract infection, site not specified: Secondary | ICD-10-CM | POA: Diagnosis not present

## 2020-11-30 DIAGNOSIS — F0280 Dementia in other diseases classified elsewhere without behavioral disturbance: Secondary | ICD-10-CM | POA: Diagnosis not present

## 2020-11-30 DIAGNOSIS — E785 Hyperlipidemia, unspecified: Secondary | ICD-10-CM | POA: Diagnosis not present

## 2020-11-30 DIAGNOSIS — S72101D Unspecified trochanteric fracture of right femur, subsequent encounter for closed fracture with routine healing: Secondary | ICD-10-CM | POA: Diagnosis not present

## 2020-11-30 DIAGNOSIS — G3183 Dementia with Lewy bodies: Secondary | ICD-10-CM | POA: Diagnosis not present

## 2020-11-30 DIAGNOSIS — E119 Type 2 diabetes mellitus without complications: Secondary | ICD-10-CM | POA: Diagnosis not present

## 2020-11-30 DIAGNOSIS — N390 Urinary tract infection, site not specified: Secondary | ICD-10-CM | POA: Diagnosis not present

## 2020-12-01 DIAGNOSIS — G3183 Dementia with Lewy bodies: Secondary | ICD-10-CM | POA: Diagnosis not present

## 2020-12-01 DIAGNOSIS — E785 Hyperlipidemia, unspecified: Secondary | ICD-10-CM | POA: Diagnosis not present

## 2020-12-01 DIAGNOSIS — N390 Urinary tract infection, site not specified: Secondary | ICD-10-CM | POA: Diagnosis not present

## 2020-12-01 DIAGNOSIS — E119 Type 2 diabetes mellitus without complications: Secondary | ICD-10-CM | POA: Diagnosis not present

## 2020-12-01 DIAGNOSIS — F0280 Dementia in other diseases classified elsewhere without behavioral disturbance: Secondary | ICD-10-CM | POA: Diagnosis not present

## 2020-12-01 DIAGNOSIS — S72101D Unspecified trochanteric fracture of right femur, subsequent encounter for closed fracture with routine healing: Secondary | ICD-10-CM | POA: Diagnosis not present

## 2020-12-05 DIAGNOSIS — F0280 Dementia in other diseases classified elsewhere without behavioral disturbance: Secondary | ICD-10-CM | POA: Diagnosis not present

## 2020-12-05 DIAGNOSIS — S72101D Unspecified trochanteric fracture of right femur, subsequent encounter for closed fracture with routine healing: Secondary | ICD-10-CM | POA: Diagnosis not present

## 2020-12-05 DIAGNOSIS — N390 Urinary tract infection, site not specified: Secondary | ICD-10-CM | POA: Diagnosis not present

## 2020-12-05 DIAGNOSIS — E119 Type 2 diabetes mellitus without complications: Secondary | ICD-10-CM | POA: Diagnosis not present

## 2020-12-05 DIAGNOSIS — G3183 Dementia with Lewy bodies: Secondary | ICD-10-CM | POA: Diagnosis not present

## 2020-12-05 DIAGNOSIS — E785 Hyperlipidemia, unspecified: Secondary | ICD-10-CM | POA: Diagnosis not present

## 2020-12-06 DIAGNOSIS — S72101D Unspecified trochanteric fracture of right femur, subsequent encounter for closed fracture with routine healing: Secondary | ICD-10-CM | POA: Diagnosis not present

## 2020-12-06 DIAGNOSIS — E119 Type 2 diabetes mellitus without complications: Secondary | ICD-10-CM | POA: Diagnosis not present

## 2020-12-06 DIAGNOSIS — N390 Urinary tract infection, site not specified: Secondary | ICD-10-CM | POA: Diagnosis not present

## 2020-12-06 DIAGNOSIS — G3183 Dementia with Lewy bodies: Secondary | ICD-10-CM | POA: Diagnosis not present

## 2020-12-06 DIAGNOSIS — E785 Hyperlipidemia, unspecified: Secondary | ICD-10-CM | POA: Diagnosis not present

## 2020-12-06 DIAGNOSIS — F0280 Dementia in other diseases classified elsewhere without behavioral disturbance: Secondary | ICD-10-CM | POA: Diagnosis not present

## 2020-12-19 DEATH — deceased

## 2021-04-06 ENCOUNTER — Ambulatory Visit: Payer: Medicare Other | Admitting: Internal Medicine
